# Patient Record
Sex: Female | Born: 1945 | Race: White | Hispanic: No | Smoking: Former smoker
Health system: Southern US, Community
[De-identification: ages and names within clinical notes are randomized; demographics above are authoritative.]

## PROBLEM LIST (undated history)

## (undated) DIAGNOSIS — R112 Nausea with vomiting, unspecified: Secondary | ICD-10-CM

## (undated) DIAGNOSIS — F329 Major depressive disorder, single episode, unspecified: Secondary | ICD-10-CM

## (undated) DIAGNOSIS — D509 Iron deficiency anemia, unspecified: Secondary | ICD-10-CM

## (undated) DIAGNOSIS — R51 Headache: Secondary | ICD-10-CM

## (undated) DIAGNOSIS — R519 Headache, unspecified: Secondary | ICD-10-CM

## (undated) DIAGNOSIS — J449 Chronic obstructive pulmonary disease, unspecified: Secondary | ICD-10-CM

## (undated) DIAGNOSIS — Z974 Presence of external hearing-aid: Secondary | ICD-10-CM

## (undated) DIAGNOSIS — Z9889 Other specified postprocedural states: Secondary | ICD-10-CM

## (undated) DIAGNOSIS — M1990 Unspecified osteoarthritis, unspecified site: Secondary | ICD-10-CM

## (undated) DIAGNOSIS — K635 Polyp of colon: Secondary | ICD-10-CM

## (undated) DIAGNOSIS — C801 Malignant (primary) neoplasm, unspecified: Secondary | ICD-10-CM

## (undated) DIAGNOSIS — I10 Essential (primary) hypertension: Secondary | ICD-10-CM

## (undated) DIAGNOSIS — Z972 Presence of dental prosthetic device (complete) (partial): Secondary | ICD-10-CM

## (undated) DIAGNOSIS — F32A Depression, unspecified: Secondary | ICD-10-CM

## (undated) DIAGNOSIS — G4730 Sleep apnea, unspecified: Secondary | ICD-10-CM

## (undated) DIAGNOSIS — B029 Zoster without complications: Secondary | ICD-10-CM

## (undated) DIAGNOSIS — N183 Chronic kidney disease, stage 3 unspecified: Secondary | ICD-10-CM

## (undated) DIAGNOSIS — Z9989 Dependence on other enabling machines and devices: Secondary | ICD-10-CM

## (undated) DIAGNOSIS — K219 Gastro-esophageal reflux disease without esophagitis: Secondary | ICD-10-CM

## (undated) HISTORY — DX: Gastro-esophageal reflux disease without esophagitis: K21.9

## (undated) HISTORY — PX: BREAST BIOPSY: SHX20

## (undated) HISTORY — DX: Depression, unspecified: F32.A

## (undated) HISTORY — DX: Essential (primary) hypertension: I10

## (undated) HISTORY — PX: NOSE SURGERY: SHX723

## (undated) HISTORY — DX: Unspecified osteoarthritis, unspecified site: M19.90

## (undated) HISTORY — PX: CARPAL TUNNEL RELEASE: SHX101

## (undated) HISTORY — DX: Major depressive disorder, single episode, unspecified: F32.9

## (undated) HISTORY — PX: TONSILLECTOMY: SUR1361

## (undated) HISTORY — PX: KNEE SURGERY: SHX244

## (undated) HISTORY — DX: Polyp of colon: K63.5

## (undated) HISTORY — DX: Iron deficiency anemia, unspecified: D50.9

## (undated) HISTORY — PX: COLONOSCOPY: SHX174

---

## 1997-10-04 ENCOUNTER — Other Ambulatory Visit: Admission: RE | Admit: 1997-10-04 | Discharge: 1997-10-04 | Payer: Self-pay | Admitting: *Deleted

## 1998-06-25 ENCOUNTER — Ambulatory Visit (HOSPITAL_COMMUNITY): Admission: RE | Admit: 1998-06-25 | Discharge: 1998-06-25 | Payer: Self-pay | Admitting: *Deleted

## 1999-03-17 ENCOUNTER — Other Ambulatory Visit: Admission: RE | Admit: 1999-03-17 | Discharge: 1999-03-17 | Payer: Self-pay | Admitting: *Deleted

## 1999-03-23 ENCOUNTER — Ambulatory Visit (HOSPITAL_COMMUNITY): Admission: RE | Admit: 1999-03-23 | Discharge: 1999-03-23 | Payer: Self-pay | Admitting: *Deleted

## 1999-07-07 ENCOUNTER — Ambulatory Visit (HOSPITAL_COMMUNITY): Admission: RE | Admit: 1999-07-07 | Discharge: 1999-07-07 | Payer: Self-pay | Admitting: *Deleted

## 2000-05-25 ENCOUNTER — Other Ambulatory Visit: Admission: RE | Admit: 2000-05-25 | Discharge: 2000-05-25 | Payer: Self-pay | Admitting: *Deleted

## 2000-07-08 ENCOUNTER — Ambulatory Visit (HOSPITAL_COMMUNITY): Admission: RE | Admit: 2000-07-08 | Discharge: 2000-07-08 | Payer: Self-pay | Admitting: *Deleted

## 2001-04-07 ENCOUNTER — Encounter (INDEPENDENT_AMBULATORY_CARE_PROVIDER_SITE_OTHER): Payer: Self-pay | Admitting: *Deleted

## 2001-04-07 ENCOUNTER — Ambulatory Visit (HOSPITAL_COMMUNITY): Admission: RE | Admit: 2001-04-07 | Discharge: 2001-04-07 | Payer: Self-pay | Admitting: Gastroenterology

## 2001-04-07 DIAGNOSIS — Z860100 Personal history of colon polyps, unspecified: Secondary | ICD-10-CM | POA: Insufficient documentation

## 2001-06-29 ENCOUNTER — Other Ambulatory Visit: Admission: RE | Admit: 2001-06-29 | Discharge: 2001-06-29 | Payer: Self-pay | Admitting: *Deleted

## 2001-07-10 ENCOUNTER — Ambulatory Visit (HOSPITAL_COMMUNITY): Admission: RE | Admit: 2001-07-10 | Discharge: 2001-07-10 | Payer: Self-pay | Admitting: *Deleted

## 2001-07-12 ENCOUNTER — Encounter: Admission: RE | Admit: 2001-07-12 | Discharge: 2001-07-12 | Payer: Self-pay | Admitting: *Deleted

## 2001-12-14 ENCOUNTER — Encounter: Admission: RE | Admit: 2001-12-14 | Discharge: 2001-12-14 | Payer: Self-pay | Admitting: *Deleted

## 2002-08-01 ENCOUNTER — Other Ambulatory Visit: Admission: RE | Admit: 2002-08-01 | Discharge: 2002-08-01 | Payer: Self-pay | Admitting: *Deleted

## 2002-10-16 ENCOUNTER — Encounter: Admission: RE | Admit: 2002-10-16 | Discharge: 2002-10-16 | Payer: Self-pay | Admitting: *Deleted

## 2003-08-08 ENCOUNTER — Other Ambulatory Visit: Admission: RE | Admit: 2003-08-08 | Discharge: 2003-08-08 | Payer: Self-pay | Admitting: *Deleted

## 2003-11-01 ENCOUNTER — Encounter: Admission: RE | Admit: 2003-11-01 | Discharge: 2003-11-01 | Payer: Self-pay | Admitting: *Deleted

## 2004-10-22 ENCOUNTER — Other Ambulatory Visit: Payer: Self-pay

## 2004-10-26 ENCOUNTER — Ambulatory Visit: Payer: Self-pay | Admitting: Orthopaedic Surgery

## 2004-12-21 ENCOUNTER — Encounter: Admission: RE | Admit: 2004-12-21 | Discharge: 2004-12-21 | Payer: Self-pay | Admitting: *Deleted

## 2006-01-14 ENCOUNTER — Encounter: Admission: RE | Admit: 2006-01-14 | Discharge: 2006-01-14 | Payer: Self-pay | Admitting: *Deleted

## 2008-05-14 ENCOUNTER — Encounter: Admission: RE | Admit: 2008-05-14 | Discharge: 2008-05-14 | Payer: Self-pay | Admitting: Obstetrics and Gynecology

## 2010-10-02 NOTE — Procedures (Signed)
[facility]  Patient:    Kristie Byrd, Kristie Byrd Visit Number: [id number] MRN: [id number]          Service Type: END Location: ENDO Attending Physician:  Charna Elizabeth Dictated by:   Anselmo Rod, M.D. Proc. Date: 04/07/01 Admit Date:  04/07/2001   CC:         Pershing Cox, M.D.   Procedure Report  DATE OF BIRTH:  03-05-1946  PROCEDURE PERFORMED:  Colonoscopy with snare polypectomy x 2.  ENDOSCOPIST:  Anselmo Rod, M.D.  INSTRUMENT USED:  Pediatric Olympus colonoscope.  INDICATIONS FOR PROCEDURE:  Trace guaiac-positive stools in a 65 year old white female undergoing screening colonoscopy.  PREPROCEDURE PREPARATION:  Informed consent was procured from the patient. The patient was fasted for eight hours prior to the procedure, and prepped with two Fleets enemas and a bottle of Fleets Phospho-Soda the night prior to the procedure.  PREPROCEDURE PHYSICAL:  The patient had stable vital signs.  NECK:  Supple. CHEST:  Clear to auscultation.  S1 and S2 is regular.  Abdomen soft with normal bowel sounds.  DESCRIPTION OF THE PROCEDURE:   The patient was placed in the left lateral decubitus position.  The patient chose not to have any sedation.  Once the patient was adequately positioned, the pediatric Olympus colonoscope was advanced from the rectum to the cecum without difficulty.  A small sessile polyp was removed from the mid-right colon with snare polypectomy.  Another smaller polyp was removed from 70 cm by snare polypectomy, as well as some internal hemorrhoids were appreciated on retroflexion.  No diverticulosis was noted.  IMPRESSION: 1. Flat polyp snared from mid-right colon. 2. Small sessile polyp snared from 70 cm. 3. Small internal hemorrhoids. 4. No diverticulosis or large masses seen.  RECOMMENDATIONS: 1. Await pathology results. 2. Avoid all nonsteroidals including aspirin for the next three    weeks. 3. Repeat  colorectal cancer screening in the next 3-5 years    depending on the pathology results. Dictated by:   Anselmo Rod, M.D. Attending Physician:  Charna Elizabeth DD:  04/07/01 TD:  04/08/01 Job: [id number]

## 2011-09-30 ENCOUNTER — Ambulatory Visit: Payer: Self-pay | Admitting: Sports Medicine

## 2011-10-18 ENCOUNTER — Ambulatory Visit: Payer: Self-pay | Admitting: Anesthesiology

## 2011-10-21 ENCOUNTER — Ambulatory Visit: Payer: Self-pay | Admitting: Orthopedic Surgery

## 2012-06-22 ENCOUNTER — Telehealth: Payer: Self-pay

## 2012-06-22 NOTE — Telephone Encounter (Signed)
Please see if another provider can work her in.  I have approved multiple new patients getting earlier appointments per week and I really cannot keep seeing this many new patients.  Thank you.

## 2012-06-22 NOTE — Telephone Encounter (Signed)
2 days ago when pt stood up pt felt weak & dizzy; no nausea, chest pain,SOB or h/a. This happened again yesterday. Last night pt took a Xanax and this morning pt did not have a problem. Pt said she is having issues with her nerves; is 66 and cannot get a job since had knee surgery. Pt said she does not have problems with health. Pt already has a new medicare pt to establish appt on 08/16/12. Pt wants to know if could be seen sooner.Please advise. Advised pt if condition were to change or worsen before our office calls her back to go to UC or ER.

## 2012-06-22 NOTE — Telephone Encounter (Signed)
i am only in the office 2 days next week - i can't really work her in ASAP

## 2012-06-23 NOTE — Telephone Encounter (Signed)
May place 2/11 at 11:30am and block my 12:00pm appt.

## 2012-06-23 NOTE — Telephone Encounter (Signed)
I called patient to notify her that Dr.Gutierrez could see her on 06/27/12.  Patient said she would wait for Dr.Aron because she didn't think it was that serious.  I advised patient if she feels worse before her appointment in April, she should go to an Urgent Care.  Thank you, Dr.Gutierrez.

## 2012-08-16 ENCOUNTER — Encounter: Payer: Self-pay | Admitting: Family Medicine

## 2012-08-16 ENCOUNTER — Ambulatory Visit (INDEPENDENT_AMBULATORY_CARE_PROVIDER_SITE_OTHER): Payer: Medicare Other | Admitting: Family Medicine

## 2012-08-16 VITALS — BP 130/64 | HR 84 | Temp 98.0°F | Ht 64.25 in | Wt 246.0 lb

## 2012-08-16 DIAGNOSIS — F32A Depression, unspecified: Secondary | ICD-10-CM

## 2012-08-16 DIAGNOSIS — R5381 Other malaise: Secondary | ICD-10-CM

## 2012-08-16 DIAGNOSIS — R259 Unspecified abnormal involuntary movements: Secondary | ICD-10-CM

## 2012-08-16 DIAGNOSIS — F329 Major depressive disorder, single episode, unspecified: Secondary | ICD-10-CM

## 2012-08-16 DIAGNOSIS — Z136 Encounter for screening for cardiovascular disorders: Secondary | ICD-10-CM

## 2012-08-16 DIAGNOSIS — Z1231 Encounter for screening mammogram for malignant neoplasm of breast: Secondary | ICD-10-CM

## 2012-08-16 DIAGNOSIS — I10 Essential (primary) hypertension: Secondary | ICD-10-CM

## 2012-08-16 DIAGNOSIS — R251 Tremor, unspecified: Secondary | ICD-10-CM

## 2012-08-16 DIAGNOSIS — K219 Gastro-esophageal reflux disease without esophagitis: Secondary | ICD-10-CM

## 2012-08-16 LAB — CBC WITH DIFFERENTIAL/PLATELET
Basophils Absolute: 0 10*3/uL (ref 0.0–0.1)
Eosinophils Relative: 1.2 % (ref 0.0–5.0)
Hemoglobin: 9.8 g/dL — ABNORMAL LOW (ref 12.0–15.0)
Lymphs Abs: 1.1 10*3/uL (ref 0.7–4.0)
Monocytes Absolute: 0.5 10*3/uL (ref 0.1–1.0)
Neutrophils Relative %: 71 % (ref 43.0–77.0)
Platelets: 307 10*3/uL (ref 150.0–400.0)

## 2012-08-16 LAB — COMPREHENSIVE METABOLIC PANEL
Albumin: 3.8 g/dL (ref 3.5–5.2)
Alkaline Phosphatase: 92 U/L (ref 39–117)
Calcium: 9.3 mg/dL (ref 8.4–10.5)
Chloride: 102 mEq/L (ref 96–112)
GFR: 57.01 mL/min — ABNORMAL LOW (ref >60.00)
Glucose, Bld: 115 mg/dL — ABNORMAL HIGH (ref 70–99)
Total Bilirubin: 0.3 mg/dL (ref 0.3–1.2)

## 2012-08-16 LAB — HEMOGLOBIN A1C: Hgb A1c MFr Bld: 5.9 % (ref 4.6–6.5)

## 2012-08-16 LAB — LIPID PANEL: Cholesterol: 154 mg/dL (ref 0–200)

## 2012-08-16 LAB — VITAMIN B12: Vitamin B-12: 324 pg/mL (ref 211–911)

## 2012-08-16 MED ORDER — ESCITALOPRAM OXALATE 20 MG PO TABS: 20.0000 mg | ORAL_TABLET | Freq: Every day | ORAL | Status: DC

## 2012-08-16 NOTE — Patient Instructions (Addendum)
Nice to meet you. Please schedule your welcome to medicare visit before your birthday (ok to use two acute slots).  We are going to wean off the celexa.  1 tablet every other day for a week and stop. Ok to start Lexapro right away.  After you go to the lab, please stop by to see [facility].

## 2012-08-16 NOTE — Progress Notes (Signed)
Subjective:    Patient ID: Kristie Byrd, female    DOB: 05-21-1945, 67 y.o.   MRN: [id number]  HPI Very pleasant 67 yo female here to establish care.  Depression- has been on celexa 10 mg daily for years.  She feels it is not working.  Was unfortunately laid off and since then feels "useless."  She is tearful and "down on herself."  She lives alone but very close to her two daughters and grandchildren.  She would never harm herself and has no thoughts of doing so.  Previously could not tolerate prozac, paxil or zoloft but cannot remember why.  HTN- has been well controlled on Lisinopril-HCTZ.  Denies any HA, blurred vision, CP or SOB.  Shakiness- has had two or three episodes of feeling shaky.  Feels better after she eats.  No nausea or vomiting when this happens.  She does get dizzy and has to sit down.  No syncope.  Unsure if she has FH of DM- she grew up in [place] in [place].  Patient Active Problem List  Diagnosis  . Hypertension  . Depression  . Shakiness  . GERD (gastroesophageal reflux disease)   Past Medical History  Diagnosis Date  . Hypertension   . Depression    Past Surgical History  Procedure Laterality Date  . Tonsillectomy    . Knee surgery     History  Substance Use Topics  . Smoking status: Former Games developer  . Smokeless tobacco: Not on file  . Alcohol Use: Not on file   Family History  Problem Relation Age of Onset  . Adopted: Yes   Allergies  Allergen Reactions  . Codeine Nausea And Vomiting  . Demerol (Meperidine) Nausea And Vomiting  . Fleet Phospho-Soda (Sodium Phosphates)     Vomiting   . Prednisone Other (See Comments)    Chest pressure   No current outpatient prescriptions on file prior to visit.   No current facility-administered medications on file prior to visit.   The PMH, PSH, Social History, Family History, Medications, and allergies have been reviewed in [facility], and have been updated if relevant.    Review of Systems See  HPI No Cp No SOB No fevers No night sweats Sleeping ok Denies increase thirst or urination +fatigue    Objective:   Physical Exam BP 130/64  Pulse 84  Temp(Src) 98 F (36.7 C)  Ht 5' 4.25" (1.632 m)  Wt 246 lb (111.585 kg)  BMI 41.9 kg/m2  General:  Well-developed,well-nourished,in no acute distress; alert,appropriate and cooperative throughout examination Head:  normocephalic and atraumatic.   Eyes:  vision grossly intact, pupils equal, pupils round, and pupils reactive to light.   Lungs:  Normal respiratory effort, chest expands symmetrically. Lungs are clear to auscultation, no crackles or wheezes. Heart:  Normal rate and regular rhythm. S1 and S2 normal without gallop, murmur, click, rub or other extra sounds. Abdomen:  Bowel sounds positive,abdomen soft and non-tender without masses, organomegaly or hernias noted. Msk:  No deformity or scoliosis noted of thoracic or lumbar spine.   Extremities:  No clubbing, cyanosis, edema, or deformity noted with normal full range of motion of all joints.   Neurologic:  alert & oriented X3 and gait normal.   Skin:  Intact without suspicious lesions or rashes Cervical Nodes:  No lymphadenopathy noted Axillary Nodes:  No palpable lymphadenopathy Psych:  Cognition and judgment appear intact. Alert and cooperative with normal attention span and concentration. No apparent delusions, illusions, hallucinations  Assessment & Plan:  1. Depression Deteriorated.  She does not want to increase celexa, would like to try something else although she is on low dose celexa. Will wean off celexa (see AVS) and start Lexapro 20 mg daily. Follow up at [facility].  2. Hypertension Stable on Lisinopril-HCTZ. - Comprehensive metabolic panel  3. Shakiness ? Low blood sugar- each instance occurred before she ate.  Check labs for further evaluation. The patient indicates understanding of these issues and agrees with the plan.  -  Hemoglobin A1c - TSH - CBC with Differential  4. Other screening mammogram  - MM Digital Screening; Future  5. GERD (gastroesophageal reflux disease) Controlled on Zantac  6. Other malaise and fatigue Likely multifactorial, worsened by depression.  Will check labs as well today. - Vitamin B12  7. Screening for ischemic heart disease  - Lipid Panel

## 2012-08-17 ENCOUNTER — Other Ambulatory Visit: Payer: Self-pay | Admitting: Family Medicine

## 2012-08-17 ENCOUNTER — Ambulatory Visit: Payer: Self-pay | Admitting: Hematology and Oncology

## 2012-08-17 ENCOUNTER — Telehealth: Payer: Self-pay

## 2012-08-17 DIAGNOSIS — D649 Anemia, unspecified: Secondary | ICD-10-CM

## 2012-08-17 NOTE — Telephone Encounter (Signed)
Pt concerned about lab results; low hgb and having to see a hematologist. Pt wants to know how serious Dr Dayton Martes thinks this is and what might be causing the anemia.Please advise.

## 2012-08-17 NOTE — Telephone Encounter (Signed)
Patient advised.

## 2012-08-17 NOTE — Telephone Encounter (Signed)
No it is not a level that requires a blood transfusion or anything like that.  I am not quite sure what is causing it yet, which is why I referred her to hematologist.  Please tell her not to worry- we are working this up.

## 2012-08-17 NOTE — Telephone Encounter (Signed)
Pt wanted to know if would see Dr Dayton Martes at 09/27/12 visit or would she be talking with someone about the welcome to medicare form. Advised pt to bring completed form with her to the visit and pt will see Dr Dayton Martes for CPX on 09/27/12.

## 2012-08-23 ENCOUNTER — Ambulatory Visit: Payer: Self-pay | Admitting: Hematology and Oncology

## 2012-08-24 LAB — BASIC METABOLIC PANEL
BUN: 24 mg/dL — ABNORMAL HIGH (ref 7–18)
Co2: 30 mmol/L (ref 21–32)
EGFR (African American): 56 — ABNORMAL LOW
EGFR (Non-African Amer.): 48 — ABNORMAL LOW
Osmolality: 282 (ref 275–301)
Potassium: 4.2 mmol/L (ref 3.5–5.1)

## 2012-08-24 LAB — CBC CANCER CENTER
Basophil %: 0.8 %
Eosinophil #: 0.1 x10 3/mm (ref 0.0–0.7)
HCT: 31.2 % — ABNORMAL LOW (ref 35.0–47.0)
HGB: 10.1 g/dL — ABNORMAL LOW (ref 12.0–16.0)
Lymphocyte #: 1.2 x10 3/mm (ref 1.0–3.6)
Lymphocyte %: 19.1 %
MCHC: 32.3 g/dL (ref 32.0–36.0)
MCV: 79 fL — ABNORMAL LOW (ref 80–100)
Monocyte #: 0.6 x10 3/mm (ref 0.2–0.9)
Monocyte %: 9.1 %
Neutrophil %: 69.3 %
Platelet: 297 x10 3/mm (ref 150–440)
WBC: 6.2 x10 3/mm (ref 3.6–11.0)

## 2012-09-14 ENCOUNTER — Ambulatory Visit: Payer: Self-pay | Admitting: Family Medicine

## 2012-09-14 ENCOUNTER — Ambulatory Visit: Payer: Self-pay | Admitting: Hematology and Oncology

## 2012-09-19 ENCOUNTER — Encounter: Payer: Self-pay | Admitting: Family Medicine

## 2012-09-20 ENCOUNTER — Encounter: Payer: Self-pay | Admitting: *Deleted

## 2012-09-26 ENCOUNTER — Other Ambulatory Visit: Payer: Self-pay | Admitting: Family Medicine

## 2012-09-27 ENCOUNTER — Encounter: Payer: Self-pay | Admitting: Family Medicine

## 2012-09-27 ENCOUNTER — Other Ambulatory Visit (HOSPITAL_COMMUNITY)
Admission: RE | Admit: 2012-09-27 | Discharge: 2012-09-27 | Disposition: A | Discharge status: Home / Self Care | Payer: Medicare Other | Source: Ambulatory Visit | Attending: Family Medicine | Admitting: Family Medicine

## 2012-09-27 ENCOUNTER — Ambulatory Visit (INDEPENDENT_AMBULATORY_CARE_PROVIDER_SITE_OTHER): Payer: Medicare Other | Admitting: Family Medicine

## 2012-09-27 VITALS — BP 118/62 | HR 72 | Temp 97.8°F | Ht 65.0 in | Wt 247.0 lb

## 2012-09-27 DIAGNOSIS — D509 Iron deficiency anemia, unspecified: Secondary | ICD-10-CM

## 2012-09-27 DIAGNOSIS — Z0000 Encounter for general adult medical examination without abnormal findings: Secondary | ICD-10-CM

## 2012-09-27 DIAGNOSIS — Z1151 Encounter for screening for human papillomavirus (HPV): Secondary | ICD-10-CM | POA: Insufficient documentation

## 2012-09-27 DIAGNOSIS — Z1211 Encounter for screening for malignant neoplasm of colon: Secondary | ICD-10-CM

## 2012-09-27 DIAGNOSIS — F329 Major depressive disorder, single episode, unspecified: Secondary | ICD-10-CM

## 2012-09-27 DIAGNOSIS — Z01419 Encounter for gynecological examination (general) (routine) without abnormal findings: Secondary | ICD-10-CM | POA: Insufficient documentation

## 2012-09-27 DIAGNOSIS — D649 Anemia, unspecified: Secondary | ICD-10-CM

## 2012-09-27 DIAGNOSIS — I10 Essential (primary) hypertension: Secondary | ICD-10-CM

## 2012-09-27 DIAGNOSIS — K219 Gastro-esophageal reflux disease without esophagitis: Secondary | ICD-10-CM

## 2012-09-27 DIAGNOSIS — F32A Depression, unspecified: Secondary | ICD-10-CM

## 2012-09-27 HISTORY — DX: Iron deficiency anemia, unspecified: D50.9

## 2012-09-27 NOTE — Addendum Note (Signed)
Addended by: Eliezer Bottom on: 09/27/2012 10:25 AM   Modules accepted: Orders

## 2012-09-27 NOTE — Progress Notes (Addendum)
Subjective:    Patient ID: Kristie Byrd, female    DOB: 1945-10-09, 67 y.o.   MRN: [id number]  HPI Very pleasant 67 yo female who established care with me last month here for Welcome to Medicare Visit. I have personally reviewed the Medicare Annual Wellness questionnaire and have noted 1. The patient's medical and social history 2. Their use of alcohol, tobacco or illicit drugs 3. Their current medications and supplements 4. The patient's functional ability including ADL's, fall risks, home safety risks and hearing or visual             impairment. 5. Diet and physical activities 6. Evidence for depression or mood disorders  End of life wishes discussed and updated in Social History.  LMP 10 years ago.  No h/o postmenopausal bleeding or abnormal pap smears.  Screening mammogram 09/14/2012.  Depression- had been on celexa 10 mg daily for years.  She felt it was not working.  Was unfortunately laid off and since then feels "useless."  Previously could not tolerate prozac, paxil or zoloft but cannot remember why. We weaned off celexa last month as she did not want to try increased dose and started lexapro 20 mg daily.  She does feel better on Lexapro.  She does think getting a new job will help even more.  HTN- has been well controlled on Lisinopril-HCTZ.  Denies any HA, blurred vision, CP or SOB. Lab Results  Component Value Date   CREATININE 1.0 08/16/2012    Anemia- CBC abnormal last month.  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  Dizziness and fatigue improving.  Follow up scheduled for July 10th.  Lab Results  Component Value Date   VITAMINB12 324 08/16/2012    Lab Results  Component Value Date   WBC 6.0 08/16/2012   HGB 9.8* 08/16/2012   HCT 29.7* 08/16/2012   MCV 78.7 08/16/2012   PLT 307.0 08/16/2012     Patient Active Problem List   Diagnosis Date Noted  . Routine general medical examination  at a health care facility 09/27/2012  . Shakiness 08/16/2012  . GERD (gastroesophageal reflux disease) 08/16/2012  . Hypertension   . Depression    Past Medical History  Diagnosis Date  . Hypertension   . Depression    Past Surgical History  Procedure Laterality Date  . Tonsillectomy    . Knee surgery     History  Substance Use Topics  . Smoking status: Former Games developer  . Smokeless tobacco: Not on file  . Alcohol Use: Not on file   Family History  Problem Relation Age of Onset  . Adopted: Yes   Allergies  Allergen Reactions  . Codeine Nausea And Vomiting  . Demerol (Meperidine) Nausea And Vomiting  . Fleet Phospho-Soda (Sodium Phosphates)     Vomiting   . Prednisone Other (See Comments)    Chest pressure   Current Outpatient Prescriptions on File Prior to Visit  Medication Sig Dispense Refill  . Acetaminophen (TYLENOL ARTHRITIS PAIN PO) Take by mouth.      Marland Kitchen aspirin 81 MG tablet Take 81 mg by mouth daily.      . Cholecalciferol (VITAMIN D3) 2000 UNITS TABS Take one by mouth daily      . citalopram (CELEXA) 10 MG tablet Take 10 mg by mouth daily.      Marland Kitchen escitalopram (LEXAPRO) 20 MG tablet Take 1 tablet (20 mg total) by mouth daily.  30 tablet  1  . lisinopril-hydrochlorothiazide (PRINZIDE,ZESTORETIC) 10-12.5 MG per tablet take 1 tablet by mouth once daily  90 tablet  1  . Multiple Vitamin (MULTIVITAMIN) tablet Take 1 tablet by mouth daily.      . ranitidine (ZANTAC) 150 MG capsule Take 150 mg by mouth daily.       No current facility-administered medications on file prior to visit.   The PMH, PSH, Social History, Family History, Medications, and allergies have been reviewed in [facility], and have been updated if relevant.    Review of Systems See HPI Patient reports no  vision/ hearing changes,anorexia, weight change, fever ,adenopathy, persistant / recurrent hoarseness, swallowing issues, chest pain, edema,persistant / recurrent cough, hemoptysis, dyspnea(rest,  exertional, paroxysmal nocturnal), gastrointestinal  bleeding (melena, rectal bleeding), abdominal pain, excessive heart burn, GU symptoms(dysuria, hematuria, pyuria, voiding/incontinence  Issues) syncope, focal weakness, severe memory loss, concerning skin lesions, depression, anxiety, abnormal bruising/bleeding, major joint swelling, breast masses or abnormal vaginal bleeding.       Objective:   Physical Exam BP 118/62  Pulse 72  Temp(Src) 97.8 F (36.6 C)  Ht 5\' 5"  (1.651 m)  Wt 247 lb (112.038 kg)  BMI 41.1 kg/m2   General:  Well-developed,well-nourished,in no acute distress; alert,appropriate and cooperative throughout examination Head:  normocephalic and atraumatic.   Eyes:  vision grossly intact, pupils equal, pupils round, and pupils reactive to light.   Ears:  R ear normal and L ear normal.   Nose:  no external deformity.   Mouth:  good dentition.   Neck:  No deformities, masses, or tenderness noted. Breasts:  No mass, nodules, thickening, tenderness, bulging, retraction, inflamation, nipple discharge or skin changes noted.   Lungs:  Normal respiratory effort, chest expands symmetrically. Lungs are clear to auscultation, no crackles or wheezes. Heart:  Normal rate and regular rhythm. S1 and S2 normal without gallop, murmur, click, rub or other extra sounds. Abdomen:  Bowel sounds positive,abdomen soft and non-tender without masses, organomegaly or hernias noted. Rectal:  no external abnormalities.   Genitalia:  Pelvic Exam:        External: normal female genitalia without lesions or masses        Vagina: normal without lesions or masses        Cervix: normal without lesions or masses        Adnexa: normal bimanual exam without masses or fullness        Uterus: normal by palpation        Pap smear: performed Msk:  No deformity or scoliosis noted of thoracic or lumbar spine.   Extremities:  No clubbing, cyanosis, edema, or deformity noted with normal full range of motion of  all joints.   Neurologic:  alert & oriented X3 and gait normal.   Skin:  Intact without suspicious lesions or rashes Cervical Nodes:  No lymphadenopathy noted Axillary Nodes:  No palpable lymphadenopathy Psych:  Cognition and judgment appear intact. Alert and cooperative with normal attention span and concentration. No apparent delusions, illusions, hallucinations      Assessment & Plan:    1. Depression Improved with change in SSRI.  Continue to monitor.  2. GERD (gastroesophageal reflux disease) Quiet on Zantac.  3. Hypertension Well controlled on current dose of Prinzide. No change to Rx.  4. Routine general medical examination at a health care facility The patients weight, height, BMI and visual acuity have been recorded in the chart I have made referrals, counseling and provided education to the patient based  review of the above and I have provided the pt with a written personalized care plan for preventive services.  Pap smear today. IFOB (declining colonoscopy). EKG NSR, low voltage.  5. [facility]

## 2012-09-27 NOTE — Patient Instructions (Signed)
Good to see you.  Please stop by the lab to get your stool cards.  Check with your insurance to see if they will cover the shingles shot.

## 2012-10-03 ENCOUNTER — Encounter: Payer: Self-pay | Admitting: *Deleted

## 2012-10-15 ENCOUNTER — Other Ambulatory Visit: Payer: Self-pay | Admitting: Family Medicine

## 2012-10-19 ENCOUNTER — Other Ambulatory Visit (INDEPENDENT_AMBULATORY_CARE_PROVIDER_SITE_OTHER): Payer: Medicare Other

## 2012-10-19 DIAGNOSIS — Z1211 Encounter for screening for malignant neoplasm of colon: Secondary | ICD-10-CM

## 2012-10-20 ENCOUNTER — Encounter: Payer: Self-pay | Admitting: *Deleted

## 2012-10-20 ENCOUNTER — Encounter: Payer: Self-pay | Admitting: Family Medicine

## 2012-10-20 LAB — FECAL OCCULT BLOOD, IMMUNOCHEMICAL: Fecal Occult Bld: NEGATIVE

## 2012-11-21 ENCOUNTER — Ambulatory Visit: Payer: Self-pay | Admitting: Hematology and Oncology

## 2012-11-22 LAB — CBC CANCER CENTER
Eosinophil #: 0.1 x10 3/mm (ref 0.0–0.7)
Eosinophil %: 1.2 %
HCT: 30.7 % — ABNORMAL LOW (ref 35.0–47.0)
MCHC: 32.3 g/dL (ref 32.0–36.0)
MCV: 79 fL — ABNORMAL LOW (ref 80–100)
Monocyte #: 0.6 x10 3/mm (ref 0.2–0.9)
Monocyte %: 8 %
Neutrophil #: 5.2 x10 3/mm (ref 1.4–6.5)
Platelet: 312 x10 3/mm (ref 150–440)
RBC: 3.87 10*6/uL (ref 3.80–5.20)
RDW: 16.1 % — ABNORMAL HIGH (ref 11.5–14.5)
WBC: 7.1 x10 3/mm (ref 3.6–11.0)

## 2012-11-22 LAB — FERRITIN: Ferritin (ARMC): 4 ng/mL — ABNORMAL LOW (ref 8–388)

## 2012-11-23 ENCOUNTER — Telehealth: Payer: Self-pay

## 2012-11-23 ENCOUNTER — Encounter: Payer: Self-pay | Admitting: Family Medicine

## 2012-11-23 ENCOUNTER — Ambulatory Visit (INDEPENDENT_AMBULATORY_CARE_PROVIDER_SITE_OTHER): Payer: Medicare Other | Admitting: Family Medicine

## 2012-11-23 VITALS — BP 120/68 | HR 80 | Temp 97.7°F | Wt 249.0 lb

## 2012-11-23 DIAGNOSIS — R5381 Other malaise: Secondary | ICD-10-CM | POA: Insufficient documentation

## 2012-11-23 DIAGNOSIS — R5383 Other fatigue: Secondary | ICD-10-CM

## 2012-11-23 MED ORDER — ONETOUCH BASIC SYSTEM W/DEVICE KIT: PACK | Status: DC

## 2012-11-23 NOTE — Progress Notes (Signed)
  Subjective:    Patient ID: Kristie Byrd, female    DOB: [birth year], 67 y.o.   MRN: [id number]  HPI  No charge.  Was told by hematology to come in for a1c to rule out DM.  A1c was normal in 08/2012.  Pt would like to cancel appt. Lab Results  Component Value Date   HGBA1C 5.9 08/16/2012     Review of Systems     Objective:   Physical Exam        Assessment & Plan:

## 2012-11-23 NOTE — Addendum Note (Signed)
Addended by: Dianne Dun on: 11/23/2012 09:10 AM   Modules accepted: Orders

## 2012-11-23 NOTE — Telephone Encounter (Signed)
[facility] [place] request specific instructions and diagnosis for glucose testing device and strips; CMA spoke with Dr Dayton Martes and pt does not have diagnosis to file with insurance co. Pt requested to have in case "felt funny" and needed to ck BS. Pt will need to pay out of pocket.

## 2012-12-14 LAB — CBC CANCER CENTER
Basophil #: 0.1 x10 3/mm (ref 0.0–0.1)
HCT: 33.4 % — ABNORMAL LOW (ref 35.0–47.0)
Lymphocyte #: 1 x10 3/mm (ref 1.0–3.6)
Lymphocyte %: 19 %
MCH: 27.7 pg (ref 26.0–34.0)
MCV: 83 fL (ref 80–100)
Monocyte #: 0.4 x10 3/mm (ref 0.2–0.9)
Monocyte %: 8.1 %
Neutrophil #: 3.7 x10 3/mm (ref 1.4–6.5)
Neutrophil %: 70.6 %
RBC: 4.01 10*6/uL (ref 3.80–5.20)
RDW: 19.7 % — ABNORMAL HIGH (ref 11.5–14.5)
WBC: 5.3 x10 3/mm (ref 3.6–11.0)

## 2012-12-14 LAB — BASIC METABOLIC PANEL
Anion Gap: 8 (ref 7–16)
Calcium, Total: 9.2 mg/dL (ref 8.5–10.1)
Chloride: 103 mmol/L (ref 98–107)
Co2: 29 mmol/L (ref 21–32)
EGFR (Non-African Amer.): 50 — ABNORMAL LOW
Glucose: 129 mg/dL — ABNORMAL HIGH (ref 65–99)
Osmolality: 283 (ref 275–301)
Potassium: 4.1 mmol/L (ref 3.5–5.1)
Sodium: 140 mmol/L (ref 136–145)

## 2012-12-15 ENCOUNTER — Ambulatory Visit: Payer: Self-pay | Admitting: Hematology and Oncology

## 2012-12-15 ENCOUNTER — Telehealth: Payer: Self-pay | Admitting: *Deleted

## 2012-12-15 DIAGNOSIS — Z1211 Encounter for screening for malignant neoplasm of colon: Secondary | ICD-10-CM

## 2012-12-15 NOTE — Telephone Encounter (Signed)
Ok to have zostavax.  I can refer to GI and she can discuss different prep options.

## 2012-12-15 NOTE — Telephone Encounter (Signed)
Pt wants to set up nurse visit for zostavax, states insurance will cover.  Also, hematologist has told her that she needs to have colonoscopy.  Pt needs referral, does not want to go to [place].  She states she is highly allergic to the prep, so if that is the only way she can have a colonoscopy then she will not have one.  Is there an alternative?  Please advise.

## 2012-12-18 MED ORDER — ZOSTER VACCINE LIVE 19400 UNT/0.65ML ~~LOC~~ SOLR: 0.6500 mL | Freq: Once | SUBCUTANEOUS | Status: DC

## 2012-12-18 NOTE — Telephone Encounter (Signed)
Agrees with referral for colonoscopy.  She wants to see someone in [place].   Also, she wants order for [facility]ostavax to [facility] to [facility] [place].

## 2012-12-18 NOTE — Telephone Encounter (Signed)
Referral placed.  Zostavax rx sent.

## 2012-12-19 ENCOUNTER — Encounter: Payer: Self-pay | Admitting: Internal Medicine

## 2012-12-19 ENCOUNTER — Encounter: Payer: Self-pay | Admitting: Family Medicine

## 2012-12-20 ENCOUNTER — Other Ambulatory Visit: Payer: Self-pay

## 2013-01-10 ENCOUNTER — Other Ambulatory Visit: Payer: Self-pay | Admitting: Family Medicine

## 2013-01-25 ENCOUNTER — Encounter: Payer: Self-pay | Admitting: Internal Medicine

## 2013-01-25 ENCOUNTER — Ambulatory Visit (INDEPENDENT_AMBULATORY_CARE_PROVIDER_SITE_OTHER): Payer: Medicare Other | Admitting: Internal Medicine

## 2013-01-25 VITALS — BP 120/74 | HR 104 | Ht 64.0 in | Wt 245.0 lb

## 2013-01-25 DIAGNOSIS — D649 Anemia, unspecified: Secondary | ICD-10-CM

## 2013-01-25 DIAGNOSIS — Z86010 Personal history of colonic polyps: Secondary | ICD-10-CM

## 2013-01-25 MED ORDER — METOCLOPRAMIDE HCL 10 MG PO TABS: 10.0000 mg | ORAL_TABLET | ORAL | Status: DC

## 2013-01-25 NOTE — Assessment & Plan Note (Addendum)
Needs a colonoscopy to look for occult blood loss from neoplasia as a cause. If negative could need EGD, ? Celiac testing.

## 2013-01-25 NOTE — Patient Instructions (Addendum)
You have been scheduled for a colonoscopy with propofol. Please follow written instructions given to you at your visit today.  Please pick up your prep kit at the pharmacy within the next 1-3 days. If you use inhalers (even only as needed), please bring them with you on the day of your procedure. Your physician has requested that you go to www.startemmi.com and enter the access code given to you at your visit today. This web site gives a general overview about your procedure. However, you should still follow specific instructions given to you by our office regarding your preparation for the procedure. We have sent the following medications to your pharmacy for you to pick up at your convenience: Reglan  We have given you a sample of prep o pick to take for your colon prep. CC:  Ruthe Mannan MD

## 2013-01-25 NOTE — Progress Notes (Signed)
Subjective:    Patient ID: Kristie Byrd, female    DOB: Sep 30, 1945, 67 y.o.   MRN: [id number]  HPI This is a very nice lady here because of a microcytic anemia and she has been recommended to have a colonoscopy. She has had them in the past - last was 2002 and had a serrated adenoma removed (Dr. Loreta Ave). She has avoided exams since due to violent vomiting from Fleet's phospho soda +/- Colyte. Prior to that she had a sigmoidoscopy that was turned into a full colonoscopy without sedation.  She is not having bleeding, iFOBT was negative 08/2012. However she has been anemic and is seeing hematology in [facility] and is getting iron infusions. Also getting B12 injections  Allergies  Allergen Reactions  . Codeine Nausea And Vomiting  . Demerol [Meperidine] Nausea And Vomiting  . Fleet Phospho-Soda [Sodium Phosphates]     Vomiting   . Prednisone Other (See Comments)    Chest pressure   Outpatient Prescriptions Prior to Visit  Medication Sig Dispense Refill  . Acetaminophen (TYLENOL ARTHRITIS PAIN PO) Take by mouth.      Marland Kitchen aspirin 81 MG tablet Take 81 mg by mouth daily.      . Blood Glucose Monitoring Suppl (ONE TOUCH BASIC SYSTEM) W/DEVICE KIT Use as directed  1 each  0  . Cholecalciferol (VITAMIN D3) 2000 UNITS TABS Take one by mouth daily      . cyanocobalamin (,VITAMIN B-12,) 1000 MCG/ML injection Inject 1,000 mcg into the muscle every 30 (thirty) days.      Marland Kitchen escitalopram (LEXAPRO) 20 MG tablet take 1 tablet by mouth once daily  30 tablet  2  . lisinopril-hydrochlorothiazide (PRINZIDE,ZESTORETIC) 10-12.5 MG per tablet take 1 tablet by mouth once daily  90 tablet  1  . Multiple Vitamin (MULTIVITAMIN) tablet Take 1 tablet by mouth daily.      . ranitidine (ZANTAC) 150 MG capsule Take 150 mg by mouth daily.      Marland Kitchen zoster vaccine live, PF, (ZOSTAVAX) 40981 UNT/0.65ML injection Inject 19,400 Units into the skin once.  1 each  0   No facility-administered medications prior to visit.   Past Medical  History  Diagnosis Date  . Hypertension   . Depression   . GERD (gastroesophageal reflux disease)   . Arthritis   . Colon polyps    Past Surgical History  Procedure Laterality Date  . Tonsillectomy    . Knee surgery Right   . Breast biopsy Bilateral   . Nose surgery     History   Social History  . Marital Status: Divorced    Spouse Name: N/A    Number of Children: 2  . Years of Education: N/A   Occupational History  . retired    Social History Main Topics  . Smoking status: Former Smoker    Types: Cigarettes  . Smokeless tobacco: Never Used  . Alcohol Use: No  . Drug Use: No  . Sexual Activity: No          Social History Narrative   Lives alone.  Divorced. Two daughters- in 37s, 3 grandchildren.   Does not know family history- raised in [place].        Desires CPR.  Does not have HPOA.   She would possibly want life support if for short period of time . Unsure about feeding tube.   Family History  Problem Relation Age of Onset  . Adopted: Yes   Review of Systems As  above. Some fatigue and malaise All other ROS negative    Objective:   Physical Exam General:  Well-developed, well-nourished and in no acute distress, obese Eyes:  anicteric. Neck:   supple w/o thyromegaly or mass.  Lungs: Clear to auscultation bilaterally. Heart:  S1S2, no rubs, murmurs, gallops. Abdomen:  obese soft, non-tender, no hepatosplenomegaly, hernia, or mass and BS+.  Rectal: deferred Lymph:  no cervical or supraclavicular adenopathy. Extremities:   no edema Skin   no rash. Neuro:  A&O x 3.  Psych:  appropriate mood and  Affect.   Data Reviewed: Lab Results  Component Value Date   WBC 6.0 08/16/2012   HGB 9.8* 08/16/2012   HCT 29.7* 08/16/2012   MCV 78.7 08/16/2012   PLT 307.0 08/16/2012   Ferritin 4 in July Lab Results  Component Value Date   VITAMINB12 324 08/16/2012       Assessment & Plan:  Personal history of colonic polyps  Anemia - iron deficiency  (? If B12)  I appreciate the opportunity to care for this patient.  ZO:[id number] Dayton Martes, MD and Dr. Gerline Legacy ([facility]em[facility]c [facility])

## 2013-01-25 NOTE — Assessment & Plan Note (Addendum)
Overdue for repeat colonoscopy to look for recurrent polyps - she had avoided due to bad experiences with prep (vomiting) and pain (unsedated)  Will try Prep-o-Pik (low volume) with metaclopramide   The risks and benefits as well as alternatives of endoscopic procedure(s) have been discussed and reviewed. All questions answered. The patient agrees to proceed.

## 2013-01-26 ENCOUNTER — Encounter: Payer: Self-pay | Admitting: Internal Medicine

## 2013-02-06 ENCOUNTER — Encounter: Payer: Self-pay | Admitting: Internal Medicine

## 2013-02-06 ENCOUNTER — Other Ambulatory Visit (INDEPENDENT_AMBULATORY_CARE_PROVIDER_SITE_OTHER): Payer: Medicare Other

## 2013-02-06 ENCOUNTER — Ambulatory Visit (AMBULATORY_SURGERY_CENTER): Payer: Medicare Other | Admitting: Internal Medicine

## 2013-02-06 VITALS — BP 131/61 | HR 81 | Temp 97.3°F | Resp 33 | Ht 64.0 in | Wt 245.0 lb

## 2013-02-06 DIAGNOSIS — D509 Iron deficiency anemia, unspecified: Secondary | ICD-10-CM

## 2013-02-06 DIAGNOSIS — D126 Benign neoplasm of colon, unspecified: Secondary | ICD-10-CM

## 2013-02-06 DIAGNOSIS — Z86010 Personal history of colonic polyps: Secondary | ICD-10-CM

## 2013-02-06 MED ORDER — SODIUM CHLORIDE 0.9 % IV SOLN: 500.0000 mL | INTRAVENOUS | Status: DC

## 2013-02-06 NOTE — Progress Notes (Signed)
Patient did not have preoperative order for IV antibiotic SSI prophylaxis. (G8918)  Patient did not experience any of the following events: a burn prior to discharge; a fall within the facility; wrong site/side/patient/procedure/implant event; or a hospital transfer or hospital admission upon discharge from the facility. (G8907)  

## 2013-02-06 NOTE — Op Note (Signed)
[facility] [address] [place], [address]   COLONOSCOPY PROCEDURE REPORT  PATIENT: Kristie Byrd, Kristie Byrd  MR#: [id number] BIRTHDATE: 10-08-1945 , 67  yrs. old GENDER: Female ENDOSCOPIST: Iva Boop, MD, [facility] REFERRED XB:JYNWG Aron, M.D. PROCEDURE DATE:  02/06/2013 PROCEDURE:   Colonoscopy with biopsy First Screening Colonoscopy - Avg.  risk and is 50 yrs.  old or older - No.  Prior Negative Screening - Now for repeat screening. N/A  History of Adenoma - Now for follow-up colonoscopy & has been > or = to 3 yrs.  Yes hx of adenoma.  Has been 3 or more years since last colonoscopy.  Polyps Removed Today? Yes. ASA CLASS:   Class III INDICATIONS:Patient's personal history of adenomatous colon polyps. Iron-deficiency anemia MEDICATIONS: propofol (Diprivan) 200mg  IV, MAC sedation, administered by CRNA, and These medications were titrated to patient response per physician's verbal order  DESCRIPTION OF PROCEDURE:   After the risks benefits and alternatives of the procedure were thoroughly explained, informed consent was obtained.  A digital rectal exam revealed no abnormalities of the rectum.   The LB [id number]-[id number]  endoscope was introduced through the anus and advanced to the cecum, which was identified by both the appendix and ileocecal valve. No adverse events experienced.   The quality of the prep was Prepopik good The instrument was then slowly withdrawn as the colon was fully examined.    COLON FINDINGS: Three diminutive sessile polyps were found in the sigmoid colon.  A polypectomy was performed with cold forceps.  The resection was complete and the polyp tissue was completely retrieved.   The colon mucosa was otherwise normal.  Retroflexed views revealed no abnormalities. The time to cecum=1 minutes 59 seconds.  Withdrawal time=9 minutes 27 seconds.  The scope was withdrawn and the procedure completed. COMPLICATIONS: There were no  complications.  ENDOSCOPIC IMPRESSION: 1.   Three diminutive sessile polyps were found in the sigmoid colon; polypectomy was performed with cold forceps 2.   The colon mucosa was otherwise normal - good prep - hx adenoma(s)  RECOMMENDATIONS: Timing of repeat colonoscopy will be determined by pathology findings. TTG Ab and IgA level today - to evaluate iron deficiency anemia   eSigned:  Iva Boop, MD, [facility] 02/06/2013 3:30 PM  cc: The Patient, Dr. Gerline [facility])and Ruthe Mannan MD

## 2013-02-06 NOTE — Progress Notes (Signed)
Pt had iron infusions June 2014 and July 2014. ewm

## 2013-02-06 NOTE — Progress Notes (Signed)
Called to room to assist during endoscopic procedure.  Patient ID and intended procedure confirmed with present staff. Received instructions for my participation in the procedure from the performing physician.  

## 2013-02-06 NOTE — Progress Notes (Signed)
Lidocaine-40mg IV prior to Propofol InductionPropofol given over incremental dosages 

## 2013-02-06 NOTE — Patient Instructions (Addendum)
I found and removed 3 tiny polyps - no cancer. No source of your anemia.  Otherwise normal colonoscopy - good prep.  I want you to get some lab tests today to look for celiac disease - could be cause of anemia. I will call with results.  I will let you know pathology results and when to have another routine colonoscopy by mail.  I appreciate the opportunity to care for you. Iva Boop, MD, FACG  YOU HAD AN ENDOSCOPIC PROCEDURE TODAY AT THE [facility]: Refer to the procedure report that was given to you for any specific questions about what was found during the examination.  If the procedure report does not answer your questions, please call your gastroenterologist to clarify.  If you requested that your care partner not be given the details of your procedure findings, then the procedure report has been included in a sealed envelope for you to review at your convenience later.  YOU SHOULD EXPECT: Some feelings of bloating in the abdomen. Passage of more gas than usual.  Walking can help get rid of the air that was put into your GI tract during the procedure and reduce the bloating. If you had a lower endoscopy (such as a colonoscopy or flexible sigmoidoscopy) you may notice spotting of blood in your stool or on the toilet paper. If you underwent a bowel prep for your procedure, then you may not have a normal bowel movement for a few days.  DIET: Your first meal following the procedure should be a light meal and then it is ok to progress to your normal diet.  A half-sandwich or bowl of soup is an example of a good first meal.  Heavy or fried foods are harder to digest and may make you feel nauseous or bloated.  Likewise meals heavy in dairy and vegetables can cause extra gas to form and this can also increase the bloating.  Drink plenty of fluids but you should avoid alcoholic beverages for 24 hours.  ACTIVITY: Your care partner should take you home directly after the procedure.   You should plan to take it easy, moving slowly for the rest of the day.  You can resume normal activity the day after the procedure however you should NOT DRIVE or use heavy machinery for 24 hours (because of the sedation medicines used during the test).    SYMPTOMS TO REPORT IMMEDIATELY: A gastroenterologist can be reached at any hour.  During normal business hours, 8:30 AM to 5:00 PM Monday through Friday, call [phone].  After hours and on weekends, please call the GI answering service at [phone] who will take a message and have the physician on call contact you.   Following lower endoscopy (colonoscopy or flexible sigmoidoscopy):  Excessive amounts of blood in the stool  Significant tenderness or worsening of abdominal pains  Swelling of the abdomen that is new, acute  Fever of 100F or higher   FOLLOW UP: If any biopsies were taken you will be contacted by phone or by letter within the next 1-3 weeks.  Call your gastroenterologist if you have not heard about the biopsies in 3 weeks.  Our staff will call the home number listed on your records the next business day following your procedure to check on you and address any questions or concerns that you may have at that time regarding the information given to you following your procedure. This is a courtesy call and so if there is no answer  at the home number and we have not heard from you through the emergency physician on call, we will assume that you have returned to your regular daily activities without incident.  SIGNATURES/CONFIDENTIALITY: You and/or your care partner have signed paperwork which will be entered into your electronic medical record.  These signatures attest to the fact that that the information above on your After Visit Summary has been reviewed and is understood.  Full responsibility of the confidentiality of this discharge information lies with you and/or your care-partner.  Recommendations Timing of repeat  colonoscopy will be determined by pathology findings. TTG, Ab, and IgA level today-to evaluate iron deficiency anemia

## 2013-02-07 ENCOUNTER — Telehealth: Payer: Self-pay | Admitting: *Deleted

## 2013-02-07 NOTE — Telephone Encounter (Signed)
No answer, message left for the patient. 

## 2013-02-08 NOTE — Progress Notes (Signed)
Quick Note:  She does not have celiac disease. Polyp pathology pending from colonoscopy. I recommend that she have an upper GI endoscopy because of the iron deficiency anemia. ______

## 2013-02-13 ENCOUNTER — Encounter: Payer: Self-pay | Admitting: Internal Medicine

## 2013-03-26 ENCOUNTER — Other Ambulatory Visit: Payer: Self-pay | Admitting: Family Medicine

## 2013-04-04 ENCOUNTER — Ambulatory Visit: Payer: Self-pay | Admitting: Hematology and Oncology

## 2013-04-04 LAB — CBC CANCER CENTER
Basophil %: 1 %
Eosinophil #: 0 x10 3/mm (ref 0.0–0.7)
Eosinophil %: 0.3 %
HCT: 35.8 % (ref 35.0–47.0)
HGB: 11.8 g/dL — ABNORMAL LOW (ref 12.0–16.0)
Lymphocyte #: 1.2 x10 3/mm (ref 1.0–3.6)
MCH: 29.7 pg (ref 26.0–34.0)
MCHC: 33.1 g/dL (ref 32.0–36.0)
MCV: 90 fL (ref 80–100)
Neutrophil #: 3.4 x10 3/mm (ref 1.4–6.5)
Neutrophil %: 65.8 %
Platelet: 266 x10 3/mm (ref 150–440)
RDW: 13.4 % (ref 11.5–14.5)

## 2013-04-04 LAB — RETICULOCYTES: Reticulocyte: 2.02 % (ref 0.4–3.1)

## 2013-04-04 LAB — IRON AND TIBC
Iron Saturation: 18 %
Unbound Iron-Bind.Cap.: 322 ug/dL

## 2013-04-04 LAB — FERRITIN: Ferritin (ARMC): 32 ng/mL (ref 8–388)

## 2013-04-16 ENCOUNTER — Ambulatory Visit: Payer: Self-pay | Admitting: Hematology and Oncology

## 2013-04-16 ENCOUNTER — Telehealth: Payer: Self-pay | Admitting: Family Medicine

## 2013-04-16 NOTE — Telephone Encounter (Signed)
Pt needs a handicapped parking sticker due to the surgery she had over a year ago.  She has to walk especially far when she goes to church and then is in so much pain she can't even focus on the service.  She wants to know how we can help her get a sticker.

## 2013-04-17 ENCOUNTER — Other Ambulatory Visit: Payer: Self-pay | Admitting: Family Medicine

## 2013-04-18 NOTE — Telephone Encounter (Signed)
Kristie Byrd talked with patient and stated that patient will be bringing the form in to be completed.

## 2013-04-18 NOTE — Telephone Encounter (Signed)
Please refill until PCP returns

## 2013-04-18 NOTE — Telephone Encounter (Signed)
done

## 2013-05-15 ENCOUNTER — Telehealth: Payer: Self-pay | Admitting: Family Medicine

## 2013-05-15 DIAGNOSIS — Z0279 Encounter for issue of other medical certificate: Secondary | ICD-10-CM

## 2013-05-15 NOTE — Telephone Encounter (Signed)
Pt dropped off Handi-Cap Sticker form. I put it in Dr. Elmer Sow inbox on her desk.

## 2013-05-16 ENCOUNTER — Other Ambulatory Visit: Payer: Self-pay | Admitting: Family Medicine

## 2013-05-16 ENCOUNTER — Telehealth: Payer: Self-pay

## 2013-05-16 NOTE — Telephone Encounter (Signed)
Pt left v/m that she has already gotten a pneumonia shot but wants to know if she should get new pneumonia shot (?prevnar 13)Please advise.

## 2013-05-18 NOTE — Telephone Encounter (Signed)
Lm on pts vm requesting a call back 

## 2013-05-18 NOTE — Telephone Encounter (Signed)
No I would not recommend it

## 2013-05-18 NOTE — Telephone Encounter (Signed)
Spoke to pt and advised per Dr Aron.  

## 2013-05-18 NOTE — Telephone Encounter (Signed)
Spoke to pt who states that she has received completed form front office

## 2013-06-18 ENCOUNTER — Other Ambulatory Visit: Payer: Self-pay | Admitting: Family Medicine

## 2013-06-18 NOTE — Telephone Encounter (Signed)
Last office visit 11/23/12. Ok to refill?

## 2013-06-27 ENCOUNTER — Ambulatory Visit: Payer: Self-pay | Admitting: Hematology and Oncology

## 2013-06-27 LAB — CBC CANCER CENTER
BASOS ABS: 0 x10 3/mm (ref 0.0–0.1)
Basophil %: 0.9 %
Eosinophil #: 0 x10 3/mm (ref 0.0–0.7)
Eosinophil %: 0.3 %
HCT: 34.9 % — AB (ref 35.0–47.0)
HGB: 11.4 g/dL — AB (ref 12.0–16.0)
Lymphocyte #: 1.1 x10 3/mm (ref 1.0–3.6)
Lymphocyte %: 22.6 %
MCH: 28.6 pg (ref 26.0–34.0)
MCHC: 32.5 g/dL (ref 32.0–36.0)
MCV: 88 fL (ref 80–100)
MONOS PCT: 9.3 %
Monocyte #: 0.5 x10 3/mm (ref 0.2–0.9)
NEUTROS PCT: 66.9 %
Neutrophil #: 3.4 x10 3/mm (ref 1.4–6.5)
Platelet: 261 x10 3/mm (ref 150–440)
RBC: 3.97 10*6/uL (ref 3.80–5.20)
RDW: 13.4 % (ref 11.5–14.5)
WBC: 5.1 x10 3/mm (ref 3.6–11.0)

## 2013-06-27 LAB — FERRITIN: Ferritin (ARMC): 14 ng/mL (ref 8–388)

## 2013-06-27 LAB — IRON AND TIBC
Iron Bind.Cap.(Total): 416 ug/dL (ref 250–450)
Iron Saturation: 18 %
Iron: 74 ug/dL (ref 50–170)
UNBOUND IRON-BIND. CAP.: 342 ug/dL

## 2013-06-27 LAB — RETICULOCYTES
Absolute Retic Count: 0.0551 10*6/uL (ref 0.019–0.186)
RETICULOCYTE: 1.39 % (ref 0.4–3.1)

## 2013-07-15 ENCOUNTER — Ambulatory Visit: Payer: Self-pay | Admitting: Hematology and Oncology

## 2013-09-19 ENCOUNTER — Other Ambulatory Visit: Payer: Self-pay | Admitting: *Deleted

## 2013-09-19 MED ORDER — LISINOPRIL-HYDROCHLOROTHIAZIDE 10-12.5 MG PO TABS: ORAL_TABLET | ORAL | Status: DC

## 2013-09-19 NOTE — Telephone Encounter (Signed)
Received a faxed refill request for b12 inj. Rx was originally written by Dr Kallie Edward Nita Sells) on 08/25/12. Rx last filled 08/19/13. Pt's last ov was a cpx in 5/14, she has no f/u appts scheduled. Is it ok to fill, or does pharmacy need to contact Dr Elvera Maria office?

## 2013-09-19 NOTE — Telephone Encounter (Signed)
Needs to follow up with Dr. Kallie Edward.

## 2013-09-19 NOTE — Telephone Encounter (Signed)
It does not look like we have ever given her an injection nor have you ordered it--please advise

## 2013-10-26 ENCOUNTER — Ambulatory Visit: Payer: Self-pay | Admitting: Hematology and Oncology

## 2013-10-26 LAB — CBC CANCER CENTER
BASOS PCT: 0.8 %
Basophil #: 0 x10 3/mm (ref 0.0–0.1)
Eosinophil #: 0 x10 3/mm (ref 0.0–0.7)
Eosinophil %: 0.5 %
HCT: 36 % (ref 35.0–47.0)
HGB: 11.8 g/dL — ABNORMAL LOW (ref 12.0–16.0)
LYMPHS ABS: 1 x10 3/mm (ref 1.0–3.6)
Lymphocyte %: 18.9 %
MCH: 28.7 pg (ref 26.0–34.0)
MCHC: 32.7 g/dL (ref 32.0–36.0)
MCV: 88 fL (ref 80–100)
MONOS PCT: 7.3 %
Monocyte #: 0.4 x10 3/mm (ref 0.2–0.9)
NEUTROS PCT: 72.5 %
Neutrophil #: 3.9 x10 3/mm (ref 1.4–6.5)
Platelet: 294 x10 3/mm (ref 150–440)
RBC: 4.11 10*6/uL (ref 3.80–5.20)
RDW: 14.6 % — AB (ref 11.5–14.5)
WBC: 5.4 x10 3/mm (ref 3.6–11.0)

## 2013-10-26 LAB — COMPREHENSIVE METABOLIC PANEL
ANION GAP: 10 (ref 7–16)
Albumin: 3.6 g/dL (ref 3.4–5.0)
Alkaline Phosphatase: 96 U/L
BILIRUBIN TOTAL: 0.4 mg/dL (ref 0.2–1.0)
BUN: 22 mg/dL — ABNORMAL HIGH (ref 7–18)
CHLORIDE: 102 mmol/L (ref 98–107)
CREATININE: 1.24 mg/dL (ref 0.60–1.30)
Calcium, Total: 9.1 mg/dL (ref 8.5–10.1)
Co2: 28 mmol/L (ref 21–32)
GFR CALC AF AMER: 52 — AB
GFR CALC NON AF AMER: 45 — AB
Glucose: 150 mg/dL — ABNORMAL HIGH (ref 65–99)
Osmolality: 286 (ref 275–301)
Potassium: 3.7 mmol/L (ref 3.5–5.1)
SGOT(AST): 15 U/L (ref 15–37)
SGPT (ALT): 21 U/L (ref 12–78)
SODIUM: 140 mmol/L (ref 136–145)
TOTAL PROTEIN: 7.4 g/dL (ref 6.4–8.2)

## 2013-10-30 ENCOUNTER — Telehealth: Payer: Self-pay | Admitting: Family Medicine

## 2013-10-30 NOTE — Telephone Encounter (Signed)
Spoke to pt and informed her Dr Deborra Medina is out of office today. Informed pt she has not been seen since 11/2012 and will need an ov for a referral. Ov sched for 06/22

## 2013-10-30 NOTE — Telephone Encounter (Signed)
Pt called wanting to know if dr Craige Cotta called to see if dr Deborra Medina would sent her up with a pulmonary dr. Abbott Pao stated dr Craige Cotta thought she had copd She would like to go the [facility] office  asap

## 2013-11-05 ENCOUNTER — Encounter: Payer: Self-pay | Admitting: Family Medicine

## 2013-11-05 ENCOUNTER — Ambulatory Visit (INDEPENDENT_AMBULATORY_CARE_PROVIDER_SITE_OTHER): Payer: Medicare Other | Admitting: Family Medicine

## 2013-11-05 VITALS — BP 126/74 | HR 75 | Temp 97.7°F | Ht 64.0 in | Wt 238.8 lb

## 2013-11-05 DIAGNOSIS — Z23 Encounter for immunization: Secondary | ICD-10-CM

## 2013-11-05 DIAGNOSIS — Z1231 Encounter for screening mammogram for malignant neoplasm of breast: Secondary | ICD-10-CM

## 2013-11-05 DIAGNOSIS — R0602 Shortness of breath: Secondary | ICD-10-CM

## 2013-11-05 DIAGNOSIS — J449 Chronic obstructive pulmonary disease, unspecified: Secondary | ICD-10-CM | POA: Insufficient documentation

## 2013-11-05 MED ORDER — ALBUTEROL SULFATE HFA 108 (90 BASE) MCG/ACT IN AERS: 2.0000 | INHALATION_SPRAY | Freq: Four times a day (QID) | RESPIRATORY_TRACT | Status: DC | PRN

## 2013-11-05 NOTE — Patient Instructions (Signed)
Good to see you. Please stop by to see Rosaria Ferries on your way out.  Use proair inhaler as needed for shortness of breath.

## 2013-11-05 NOTE — Addendum Note (Signed)
Addended by: Modena Nunnery on: 11/05/2013 10:20 AM   Modules accepted: Orders

## 2013-11-05 NOTE — Progress Notes (Signed)
Subjective:   Patient ID: Kristie Byrd, female    DOB: 10-24-1945, 68 y.o.   MRN: [id number]  Kristie Byrd is a pleasant 68 y.o. year old female who presents to clinic today with Shortness of Breath  on 11/05/2013  HPI: Here for ? COPD.  She is also due for mammogram and Td.  I have not seen her since shortly after she established care with me last year.  Does go to cancer center for anemia.  Saw her hematologist, Dr. Craige Cotta, who told her she thought she had COPD.  Told her her "lungs" sound good but wants a referral to pulmnologist.  Quit smoking over 15 years ago.  For years, persistent SOB, worsened with exertion.  Thought it was her weight- not improved with a little weight loss. No CP.  Lab Results  Component Value Date   WBC 6.0 08/16/2012   HGB 9.8* 08/16/2012   HCT 29.7* 08/16/2012   MCV 78.7 08/16/2012   PLT 307.0 08/16/2012   Lab Results  Component Value Date   TSH 1.40 08/16/2012    Wt Readings from Last 3 Encounters:  11/05/13 238 lb 12 oz (108.296 kg)  02/06/13 245 lb (111.131 kg)  01/25/13 245 lb (111.131 kg)   Current Outpatient Prescriptions on File Prior to Visit  Medication Sig Dispense Refill  . Acetaminophen (TYLENOL ARTHRITIS PAIN PO) Take by mouth.      Marland Kitchen aspirin 81 MG tablet Take 81 mg by mouth daily.      . Cholecalciferol (VITAMIN D3) 2000 UNITS TABS Take one by mouth daily      . cyanocobalamin (,VITAMIN B-12,) 1000 MCG/ML injection Inject 1,000 mcg into the muscle every 30 (thirty) days.      Marland Kitchen escitalopram (LEXAPRO) 20 MG tablet take 1 tablet by mouth once daily  30 tablet  6  . lisinopril-hydrochlorothiazide (PRINZIDE,ZESTORETIC) 10-12.5 MG per tablet take 1 tablet by mouth once daily Please schedule an appointment with Dr, having fasting labs prior, for any refills.  90 tablet  0  . Multiple Vitamin (MULTIVITAMIN) tablet Take 1 tablet by mouth daily.      . ranitidine (ZANTAC) 150 MG capsule Take 150 mg by mouth daily.       No current  facility-administered medications on file prior to visit.    Allergies  Allergen Reactions  . Codeine Nausea And Vomiting  . Demerol [Meperidine] Nausea And Vomiting  . Fleet Phospho-Soda [Sodium Phosphates]     Vomiting   . Prednisone Other (See Comments)    Chest pressure    Past Medical History  Diagnosis Date  . Hypertension   . Depression   . GERD (gastroesophageal reflux disease)   . Arthritis   . Colon polyps   . Iron deficiency anemia 09/27/2012    Past Surgical History  Procedure Laterality Date  . Tonsillectomy    . Knee surgery Right   . Breast biopsy Bilateral   . Nose surgery    . Colonoscopy  multiple    No family history on file.  History   Social History  . Marital Status: Divorced    Spouse Name: N/A    Number of Children: 2  . Years of Education: N/A   Occupational History  . retired    Social History Main Topics  . Smoking status: Former Smoker    Types: Cigarettes  . Smokeless tobacco: Never Used  . Alcohol Use: No  . Drug Use: No  . Sexual Activity: Not  on file   Other Topics Concern  . Not on file   Social History Narrative   Lives alone.  Divorced. Two daughters- in 51s, 3 grandchildren.   Does not know family history- raised in [place] [facility] [place].        Desires CPR.  Does not have HPOA.   She would possibly want life support if for short period of time . Unsure about feeding tube.   The PMH, PSH, Social History, Family History, Medications, and allergies have been reviewed in [facility], and have been updated if relevant.   Review of Systems    See HPI No cough No fevers Objective:    BP 126/74  Pulse 75  Temp(Src) 97.7 F (36.5 C) (Oral)  Ht 5\' 4"  (1.626 m)  Wt 238 lb 12 oz (108.296 kg)  BMI 40.96 kg/m2  SpO2 95%   Physical Exam Gen:  Alert, pleasant, NAD Resp: CTA bilaterally No increased WOB CVS:  RRR Ext:  No edema      Assessment & Plan:   SOB (shortness of breath) No Follow-up on file.

## 2013-11-05 NOTE — Assessment & Plan Note (Signed)
Declining CXR or blood work today (CBC, TSH). eRx sent for prn albuterol. Lung exam reassuring. She is a former smoker- would likely benefit from inhaled steroid but she is allergic to prednisone. Will refer to pulm for pulmonary function tests and tx.

## 2013-11-05 NOTE — Progress Notes (Signed)
Pre visit review using our clinic review tool, if applicable. No additional management support is needed unless otherwise documented below in the visit note. 

## 2013-11-14 ENCOUNTER — Ambulatory Visit: Payer: Self-pay | Admitting: Hematology and Oncology

## 2013-11-14 ENCOUNTER — Ambulatory Visit: Payer: Self-pay | Admitting: Family Medicine

## 2013-11-15 ENCOUNTER — Telehealth: Payer: Self-pay | Admitting: Pulmonary Disease

## 2013-11-15 ENCOUNTER — Encounter: Payer: Self-pay | Admitting: Family Medicine

## 2013-11-15 ENCOUNTER — Ambulatory Visit (INDEPENDENT_AMBULATORY_CARE_PROVIDER_SITE_OTHER): Payer: Medicare Other | Admitting: Pulmonary Disease

## 2013-11-15 ENCOUNTER — Encounter: Payer: Self-pay | Admitting: Pulmonary Disease

## 2013-11-15 VITALS — BP 128/70 | HR 96 | Ht 64.0 in | Wt 227.0 lb

## 2013-11-15 DIAGNOSIS — R0602 Shortness of breath: Secondary | ICD-10-CM

## 2013-11-15 NOTE — Progress Notes (Signed)
Subjective:    Patient ID: Kristie Byrd, female    DOB: 15-Jun-1945, 67 y.o.   MRN: [id number]  HPI  This is a very pleasant 68 year old female with a past medical history significant for anemia who comes to my clinic today for evaluation of possible COPD. She says that she has no shortness of breath on exertion with minimal activity for the last several years. However, she attributed that just to her obesity. She was told recently had a doctor's appointment that she was wheezing and that she may have COPD so the recommended that she come to see me for further evaluation. She has used albuterol on an as-needed basis lately and she says that it does seem to help her breathing significantly. She says that when she exerts herself she may feel some chest tightness and wheezing. She previously smoked one pack of cigarettes daily for 37 years and quit in 1998. She does not actively smoke. To her knowledge she does not have cardiac disease.  Past Medical History  Diagnosis Date  . Hypertension   . Depression   . GERD (gastroesophageal reflux disease)   . Arthritis   . Colon polyps   . Iron deficiency anemia 09/27/2012     No family history on file.   History   Social History  . Marital Status: Divorced    Spouse Name: N/A    Number of Children: 2  . Years of Education: N/A   Occupational History  . retired    Social History Main Topics  . Smoking status: Former Smoker -- 1.00 packs/day for 37 years    Types: Cigarettes    Quit date: 11/15/1996  . Smokeless tobacco: Never Used  . Alcohol Use: No  . Drug Use: No  . Sexual Activity: Not on file   Other Topics Concern  . Not on file   Social History Narrative   Lives alone.  Divorced. Two daughters- in 61s, 3 grandchildren.   Does not know family history- raised in [place] [facility].        Desires CPR.  Does not have HPOA.   She would possibly want life support if for short period of time . Unsure about feeding tube.       Allergies  Allergen Reactions  . Codeine Nausea And Vomiting  . Demerol [Meperidine] Nausea And Vomiting  . Fleet Phospho-Soda [Sodium Phosphates]     Vomiting   . Prednisone Other (See Comments)    Chest pressure     Outpatient Prescriptions Prior to Visit  Medication Sig Dispense Refill  . Acetaminophen (TYLENOL ARTHRITIS PAIN PO) Take by mouth.      Marland Kitchen albuterol (PROVENTIL HFA;VENTOLIN HFA) 108 (90 BASE) MCG/ACT inhaler Inhale 2 puffs into the lungs every 6 (six) hours as needed for wheezing or shortness of breath.  1 Inhaler  0  . aspirin 81 MG tablet Take 81 mg by mouth daily.      . Cholecalciferol (VITAMIN D3) 2000 UNITS TABS Take one by mouth daily      . cyanocobalamin (,VITAMIN B-12,) 1000 MCG/ML injection Inject 1,000 mcg into the muscle every 30 (thirty) days.      Marland Kitchen escitalopram (LEXAPRO) 20 MG tablet take 1 tablet by mouth once daily  30 tablet  6  . lisinopril-hydrochlorothiazide (PRINZIDE,ZESTORETIC) 10-12.5 MG per tablet take 1 tablet by mouth once daily Please schedule an appointment with Dr, having fasting labs prior, for any refills.  90 tablet  0  .  Multiple Vitamin (MULTIVITAMIN) tablet Take 1 tablet by mouth daily.      . ranitidine (ZANTAC) 150 MG capsule Take 150 mg by mouth daily.       No facility-administered medications prior to visit.      Review of Systems  Constitutional: Negative for fever and unexpected weight change.  HENT: Positive for congestion. Negative for dental problem, ear pain, nosebleeds, postnasal drip, rhinorrhea, sinus pressure, sneezing, sore throat and trouble swallowing.   Eyes: Negative for redness and itching.  Respiratory: Positive for shortness of breath and wheezing. Negative for cough and chest tightness.   Cardiovascular: Negative for palpitations and leg swelling.  Gastrointestinal: Negative for nausea and vomiting.  Genitourinary: Negative for dysuria.  Musculoskeletal: Negative for joint swelling.  Skin: Negative for  rash.  Neurological: Negative for headaches.  Hematological: Does not bruise/bleed easily.  Psychiatric/Behavioral: Negative for dysphoric mood. The patient is not nervous/anxious.        Objective:   Physical Exam Filed Vitals:   11/15/13 1434  BP: 128/70  Pulse: 96  Height: 5\' 4"  (1.626 m)  Weight: 227 lb (102.967 kg)  SpO2: 95%  RA  Gen: obese but well appearing, no acute distress HEENT: NCAT, PERRL, EOMi, OP clear, neck supple without masses PULM: CTA B CV: RRR, slight systolic murmur, no JVD AB: BS+, soft, nontender, no hsm Ext: warm, trace edema, no clubbing, no cyanosis Derm: no rash or skin breakdown Neuro: A&Ox4, CN II-XII intact, strength 5/5 in all 4 extremities       Assessment & Plan:   COPD (chronic obstructive pulmonary disease) COPD: GOLD Grade A Combined recommendations from the [place], [employer] of Chest Physicians, Investment banker, corporate, European Respiratory Society (Qaseem A et al, Ann Intern Med. 2011;155(3):179) recommends tobacco cessation, pulmonary rehab (for symptomatic patients with an FEV1 < 50% predicted), supplemental oxygen (for patients with SaO2 <88% or paO2 <55), and appropriate bronchodilator therapy.  In regards to long acting bronchodilators, they recommend monotherapy (FEV1 60-80% with symptoms weak evidence, FEV1 with symptoms <60% strong evidence), or combination therapy (FEV1 <60% with symptoms, strong recommendation, moderate evidence).  One should also provide patients with annual immunizations and consider therapy for prevention of COPD exacerbations (ie. roflumilast or azithromycin) when appopriate.  -O2 therapy: Not indicated -Immunizations: Up-to-date -Tobacco use: Not actively smoking -Exercise: Encouraged regular exercise  -Bronchodilator therapy: Start Spiriva for a trial to see if it helps, though given her relatively mild symptoms she could just use a short acting bronchodilator as needed.  She will let us know if the Spiriva helps -Exacerbation prevention: Get a flu shot every year    Updated Medication List Outpatient Encounter Prescriptions as of 11/15/2013  Medication Sig  . Acetaminophen (TYLENOL ARTHRITIS PAIN PO) Take by mouth.  Marland Kitchen albuterol (PROVENTIL HFA;VENTOLIN HFA) 108 (90 BASE) MCG/ACT inhaler Inhale 2 puffs into the lungs every 6 (six) hours as needed for wheezing or shortness of breath.  Marland Kitchen aspirin 81 MG tablet Take 81 mg by mouth daily.  . Cholecalciferol (VITAMIN D3) 2000 UNITS TABS Take one by mouth daily  . cyanocobalamin (,VITAMIN B-12,) 1000 MCG/ML injection Inject 1,000 mcg into the muscle every 30 (thirty) days.  Marland Kitchen escitalopram (LEXAPRO) 20 MG tablet take 1 tablet by mouth once daily  . lisinopril-hydrochlorothiazide (PRINZIDE,ZESTORETIC) 10-12.5 MG per tablet take 1 tablet by mouth once daily Please schedule an appointment with Dr, having fasting labs prior, for any refills.  . Multiple Vitamin (MULTIVITAMIN) tablet Take 1 tablet  by mouth daily.  . [DISCONTINUED] ranitidine (ZANTAC) 150 MG capsule Take 150 mg by mouth daily.

## 2013-11-15 NOTE — Assessment & Plan Note (Addendum)
COPD: GOLD Grade A Combined recommendations from the [employer], [employer], Investment banker, corporate, [place] (Qaseem A et al, Ann Intern Med. 2011;155(3):179) recommends tobacco cessation, pulmonary rehab (for symptomatic patients with an FEV1 < 50% predicted), supplemental oxygen (for patients with SaO2 <88% or paO2 <55), and appropriate bronchodilator therapy.  In regards to long acting bronchodilators, they recommend monotherapy (FEV1 60-80% with symptoms weak evidence, FEV1 with symptoms <60% strong evidence), or combination therapy (FEV1 <60% with symptoms, strong recommendation, moderate evidence).  One should also provide patients with annual immunizations and consider therapy for prevention of COPD exacerbations (ie. roflumilast or azithromycin) when appopriate.  -O2 therapy: Not indicated -Immunizations: Up-to-date -Tobacco use: Not actively smoking -Exercise: Encouraged regular exercise  -Bronchodilator therapy: Start Spiriva for a trial to see if it helps, though given her relatively mild symptoms she could just use a short acting bronchodilator as needed. She will let us know if the Spiriva helps -Exacerbation prevention: Get a flu shot every year

## 2013-11-15 NOTE — Telephone Encounter (Signed)
Error

## 2013-11-15 NOTE — Patient Instructions (Signed)
You have COPD Get a flu shot in the fall Stay active Try Spiriva 2 puffs daily and let us know if it helps We will set up a Chest X-ray We will see you back in 6 months or sooner if needed

## 2013-12-01 ENCOUNTER — Other Ambulatory Visit: Payer: Self-pay | Admitting: Family Medicine

## 2013-12-03 ENCOUNTER — Telehealth: Payer: Self-pay | Admitting: Pulmonary Disease

## 2013-12-03 ENCOUNTER — Telehealth: Payer: Self-pay

## 2013-12-03 MED ORDER — ALBUTEROL SULFATE HFA 108 (90 BASE) MCG/ACT IN AERS: 2.0000 | INHALATION_SPRAY | Freq: Four times a day (QID) | RESPIRATORY_TRACT | Status: DC | PRN

## 2013-12-03 NOTE — Telephone Encounter (Signed)
LMTCB x 1 

## 2013-12-03 NOTE — Telephone Encounter (Signed)
Sure, OK to switch

## 2013-12-03 NOTE — Telephone Encounter (Signed)
Pt left v/m requesting spiriva and albuterol from Dr Lake Bells. Spoke with pt and she recently saw Dr Lake Bells and thought spiriva and albuterol were going to be filled. Gave pt Dr Kathee Delton office [phone] and transferred pt to Dr Anastasia Pall office.

## 2013-12-03 NOTE — Telephone Encounter (Signed)
Called and spoke with pt and she stated that she has been using the spiriva respimat and this is not working very well for her.  She is wanting to know if she can change to the spiriva handihaler.  BQ please advise. Thanks  Allergies  Allergen Reactions  . Codeine Nausea And Vomiting  . Demerol [Meperidine] Nausea And Vomiting  . Fleet Phospho-Soda [Sodium Phosphates]     Vomiting   . Prednisone Other (See Comments)    Chest pressure    Current Outpatient Prescriptions on File Prior to Visit  Medication Sig Dispense Refill  . Acetaminophen (TYLENOL ARTHRITIS PAIN PO) Take by mouth.      Marland Kitchen albuterol (PROVENTIL HFA;VENTOLIN HFA) 108 (90 BASE) MCG/ACT inhaler Inhale 2 puffs into the lungs every 6 (six) hours as needed for wheezing or shortness of breath.  1 Inhaler  0  . aspirin 81 MG tablet Take 81 mg by mouth daily.      . Cholecalciferol (VITAMIN D3) 2000 UNITS TABS Take one by mouth daily      . cyanocobalamin (,VITAMIN B-12,) 1000 MCG/ML injection Inject 1,000 mcg into the muscle every 30 (thirty) days.      Marland Kitchen escitalopram (LEXAPRO) 20 MG tablet take 1 tablet by mouth once daily  30 tablet  6  . lisinopril-hydrochlorothiazide (PRINZIDE,ZESTORETIC) 10-12.5 MG per tablet take 1 tablet by mouth once daily Please schedule an appointment with Dr, having fasting labs prior, for any refills.  90 tablet  0  . Multiple Vitamin (MULTIVITAMIN) tablet Take 1 tablet by mouth daily.      Marland Kitchen PROAIR HFA 108 (90 BASE) MCG/ACT inhaler inhale 2 puffs by mouth every 6 hours if needed for wheezing or shortness of breath  8.5 g  0   No current facility-administered medications on file prior to visit.

## 2013-12-03 NOTE — Addendum Note (Signed)
Addended by: Modena Nunnery on: 12/03/2013 12:32 PM   Modules accepted: Orders

## 2013-12-04 MED ORDER — TIOTROPIUM BROMIDE MONOHYDRATE 18 MCG IN CAPS: 18.0000 ug | ORAL_CAPSULE | Freq: Every day | RESPIRATORY_TRACT | Status: DC

## 2013-12-04 NOTE — Telephone Encounter (Signed)
Pt aware of recs. RX sent. Nothing further needed 

## 2013-12-04 NOTE — Telephone Encounter (Signed)
lmtcb x2 

## 2013-12-04 NOTE — Telephone Encounter (Signed)
Pt has returned call. °

## 2013-12-21 ENCOUNTER — Other Ambulatory Visit: Payer: Self-pay | Admitting: Family Medicine

## 2014-01-22 ENCOUNTER — Other Ambulatory Visit: Payer: Self-pay | Admitting: Family Medicine

## 2014-01-22 NOTE — Telephone Encounter (Signed)
Last filled 06/2013

## 2014-02-05 ENCOUNTER — Telehealth: Payer: Self-pay | Admitting: Pulmonary Disease

## 2014-02-05 NOTE — Telephone Encounter (Signed)
OK by me 

## 2014-02-05 NOTE — Telephone Encounter (Signed)
Called spoke with pt. She is asking to use the spiriva respimat again. She does not feel like the spiriva handihaler gives her full the medication. Please advise BQ thanks

## 2014-02-06 MED ORDER — TIOTROPIUM BROMIDE MONOHYDRATE 2.5 MCG/ACT IN AERS: 2.0000 | INHALATION_SPRAY | Freq: Every day | RESPIRATORY_TRACT | Status: DC

## 2014-02-06 NOTE — Telephone Encounter (Signed)
Pt aware RX sent in. Nothing further needed 

## 2014-02-07 ENCOUNTER — Telehealth: Payer: Self-pay | Admitting: Family Medicine

## 2014-02-07 NOTE — Telephone Encounter (Signed)
error 

## 2014-02-08 ENCOUNTER — Encounter: Payer: Self-pay | Admitting: Family Medicine

## 2014-02-08 ENCOUNTER — Ambulatory Visit (INDEPENDENT_AMBULATORY_CARE_PROVIDER_SITE_OTHER): Payer: Medicare Other | Admitting: Family Medicine

## 2014-02-08 VITALS — BP 124/64 | HR 98 | Temp 97.8°F | Wt 237.5 lb

## 2014-02-08 DIAGNOSIS — M25569 Pain in unspecified knee: Secondary | ICD-10-CM

## 2014-02-08 DIAGNOSIS — M25561 Pain in right knee: Secondary | ICD-10-CM | POA: Insufficient documentation

## 2014-02-08 DIAGNOSIS — M25552 Pain in left hip: Secondary | ICD-10-CM

## 2014-02-08 DIAGNOSIS — M25559 Pain in unspecified hip: Secondary | ICD-10-CM

## 2014-02-08 MED ORDER — MELOXICAM 15 MG PO TABS: 15.0000 mg | ORAL_TABLET | Freq: Every day | ORAL | Status: DC

## 2014-02-08 NOTE — Assessment & Plan Note (Signed)
New- probable bursitis from gait compensation due to right knee pain.

## 2014-02-08 NOTE — Patient Instructions (Signed)
Good to see you. Take 1/2 to 1 tablet of meloxicam daily with food - for next two weeks. Ok to take tylenol with this.

## 2014-02-08 NOTE — Assessment & Plan Note (Signed)
Deteriorated. Likely has OA as well as chronic meniscal changes. Will start po meloxicam. Declined xray today due to cost.

## 2014-02-08 NOTE — Progress Notes (Signed)
Pre visit review using our clinic review tool, if applicable. No additional management support is needed unless otherwise documented below in the visit note. 

## 2014-02-08 NOTE — Progress Notes (Signed)
Subjective:   Patient ID: Kristie Byrd, female    DOB: 05-29-1945, 68 y.o.   MRN: [id number]  Kristie Byrd is a pleasant 68 y.o. year old female who presents to clinic today with Knee Pain and Hip Pain  on 02/08/2014  HPI: Right knee pain- S/p meniscal surgery by Dr. Rudene Christians a few years ago.  H/o two torn menisci. Taking two tylenol arthritis daily.  Pain has been ongoing for years, worse past several months. No recent injuries. Has noticed it has been more swollen. No warmth or erythema.  H/o synvisc injections.  Cannot tolerate steroids.  Current Outpatient Prescriptions on File Prior to Visit  Medication Sig Dispense Refill  . Acetaminophen (TYLENOL ARTHRITIS PAIN PO) Take by mouth.      Marland Kitchen albuterol (PROVENTIL HFA;VENTOLIN HFA) 108 (90 BASE) MCG/ACT inhaler Inhale 2 puffs into the lungs every 6 (six) hours as needed for wheezing or shortness of breath.  1 Inhaler  0  . aspirin 81 MG tablet Take 81 mg by mouth daily.      . Cholecalciferol (VITAMIN D3) 2000 UNITS TABS Take one by mouth daily      . cyanocobalamin (,VITAMIN B-12,) 1000 MCG/ML injection Inject 1,000 mcg into the muscle every 30 (thirty) days.      Marland Kitchen escitalopram (LEXAPRO) 20 MG tablet take 1 tablet by mouth once daily  30 tablet  6  . lisinopril-hydrochlorothiazide (PRINZIDE,ZESTORETIC) 10-12.5 MG per tablet Take 1 tablet by mouth daily. Needs to schedule for a physical, having fasting labs prior, for more refills.  90 tablet  0  . Multiple Vitamin (MULTIVITAMIN) tablet Take 1 tablet by mouth daily.      Marland Kitchen PROAIR HFA 108 (90 BASE) MCG/ACT inhaler inhale 2 puffs by mouth every 6 hours if needed for wheezing or shortness of breath  8.5 g  0  . Tiotropium Bromide Monohydrate (SPIRIVA RESPIMAT) 2.5 MCG/ACT AERS Inhale 2 puffs into the lungs daily.  4 g  3   No current facility-administered medications on file prior to visit.    Allergies  Allergen Reactions  . Codeine Nausea And Vomiting  . Demerol [Meperidine]  Nausea And Vomiting  . Fleet Phospho-Soda [Sodium Phosphates]     Vomiting   . Prednisone Other (See Comments)    Chest pressure    Past Medical History  Diagnosis Date  . Hypertension   . Depression   . GERD (gastroesophageal reflux disease)   . Arthritis   . Colon polyps   . Iron deficiency anemia 09/27/2012    Past Surgical History  Procedure Laterality Date  . Tonsillectomy    . Knee surgery Right   . Breast biopsy Bilateral   . Nose surgery    . Colonoscopy  multiple    No family history on file.  History   Social History  . Marital Status: Divorced    Spouse Name: N/A    Number of Children: 2  . Years of Education: N/A   Occupational History  . retired    Social History Main Topics  . Smoking status: Former Smoker -- 1.00 packs/day for 37 years    Types: Cigarettes    Quit date: 11/15/1996  . Smokeless tobacco: Never Used  . Alcohol Use: No  . Drug Use: No  . Sexual Activity: Not on file   Other Topics Concern  . Not on file   Social History Narrative   Lives alone.  Divorced. Two daughters- in 37s, 3 grandchildren.  Does not know family history- raised in [facility].        Desires CPR.  Does not have HPOA.   She would possibly want life support if for short period of time . Unsure about feeding tube.   The PMH, PSH, Social History, Family History, Medications, and allergies have been reviewed in [facility], and have been updated if relevant.   Review of Systems    See HPI No LE edema No TTP No rashes No warmth +left hip pain for past few weeks Objective:    BP 124/64  Pulse 98  Temp(Src) 97.8 F (36.6 C) (Oral)  Wt 237 lb 8 oz (107.729 kg)  SpO2 96%   Physical Exam  Nursing note and vitals reviewed. Constitutional: She is oriented to person, place, and time. She appears well-developed and well-nourished. No distress.  obese  Musculoskeletal:       Right knee: She exhibits swelling and effusion. She exhibits normal range  of motion, no erythema and no LCL laxity. Tenderness found. Medial joint line tenderness noted.  Neurological: She is alert and oriented to person, place, and time. No cranial nerve deficit.  Skin: Skin is warm and dry.  Psychiatric: She has a normal mood and affect. Her speech is normal and behavior is normal. Judgment and thought content normal.          Assessment & Plan:   Right knee pain  Left hip pain No Follow-up on file.

## 2014-02-25 ENCOUNTER — Ambulatory Visit: Payer: Self-pay | Admitting: Oncology

## 2014-02-25 LAB — CBC CANCER CENTER
BASOS PCT: 1 %
Basophil #: 0.1 x10 3/mm (ref 0.0–0.1)
Eosinophil #: 0 x10 3/mm (ref 0.0–0.7)
Eosinophil %: 0.5 %
HCT: 34.9 % — ABNORMAL LOW (ref 35.0–47.0)
HGB: 11.4 g/dL — ABNORMAL LOW (ref 12.0–16.0)
LYMPHS ABS: 1.1 x10 3/mm (ref 1.0–3.6)
Lymphocyte %: 19.9 %
MCH: 28.9 pg (ref 26.0–34.0)
MCHC: 32.6 g/dL (ref 32.0–36.0)
MCV: 89 fL (ref 80–100)
MONO ABS: 0.5 x10 3/mm (ref 0.2–0.9)
Monocyte %: 8.9 %
Neutrophil #: 3.9 x10 3/mm (ref 1.4–6.5)
Neutrophil %: 69.7 %
Platelet: 256 x10 3/mm (ref 150–440)
RBC: 3.93 10*6/uL (ref 3.80–5.20)
RDW: 14.7 % — ABNORMAL HIGH (ref 11.5–14.5)
WBC: 5.6 x10 3/mm (ref 3.6–11.0)

## 2014-02-25 LAB — COMPREHENSIVE METABOLIC PANEL
ALK PHOS: 91 U/L
Albumin: 3.6 g/dL (ref 3.4–5.0)
Anion Gap: 7 (ref 7–16)
BILIRUBIN TOTAL: 0.2 mg/dL (ref 0.2–1.0)
BUN: 24 mg/dL — ABNORMAL HIGH (ref 7–18)
CHLORIDE: 102 mmol/L (ref 98–107)
CREATININE: 1.28 mg/dL (ref 0.60–1.30)
Calcium, Total: 9 mg/dL (ref 8.5–10.1)
Co2: 28 mmol/L (ref 21–32)
EGFR (African American): 54 — ABNORMAL LOW
GFR CALC NON AF AMER: 44 — AB
Glucose: 124 mg/dL — ABNORMAL HIGH (ref 65–99)
OSMOLALITY: 279 (ref 275–301)
Potassium: 4.2 mmol/L (ref 3.5–5.1)
SGOT(AST): 15 U/L (ref 15–37)
SGPT (ALT): 23 U/L
Sodium: 137 mmol/L (ref 136–145)
TOTAL PROTEIN: 6.7 g/dL (ref 6.4–8.2)

## 2014-03-09 ENCOUNTER — Other Ambulatory Visit: Payer: Self-pay | Admitting: Family Medicine

## 2014-03-17 ENCOUNTER — Ambulatory Visit: Payer: Self-pay | Admitting: Oncology

## 2014-04-02 ENCOUNTER — Other Ambulatory Visit: Payer: Self-pay | Admitting: Family Medicine

## 2014-06-18 ENCOUNTER — Other Ambulatory Visit: Payer: Self-pay | Admitting: Family Medicine

## 2014-06-19 ENCOUNTER — Encounter: Payer: Self-pay | Admitting: Family Medicine

## 2014-06-19 ENCOUNTER — Ambulatory Visit (INDEPENDENT_AMBULATORY_CARE_PROVIDER_SITE_OTHER): Payer: PPO | Admitting: Family Medicine

## 2014-06-19 VITALS — BP 130/68 | HR 105 | Temp 97.7°F | Wt 242.0 lb

## 2014-06-19 DIAGNOSIS — Z13220 Encounter for screening for lipoid disorders: Secondary | ICD-10-CM

## 2014-06-19 DIAGNOSIS — D509 Iron deficiency anemia, unspecified: Secondary | ICD-10-CM

## 2014-06-19 DIAGNOSIS — F32A Depression, unspecified: Secondary | ICD-10-CM

## 2014-06-19 DIAGNOSIS — F329 Major depressive disorder, single episode, unspecified: Secondary | ICD-10-CM

## 2014-06-19 LAB — CBC WITH DIFFERENTIAL/PLATELET
BASOS PCT: 0.4 % (ref 0.0–3.0)
Basophils Absolute: 0 10*3/uL (ref 0.0–0.1)
Eosinophils Absolute: 0.2 10*3/uL (ref 0.0–0.7)
Eosinophils Relative: 2.5 % (ref 0.0–5.0)
HCT: 32.1 % — ABNORMAL LOW (ref 36.0–46.0)
Hemoglobin: 10.8 g/dL — ABNORMAL LOW (ref 12.0–15.0)
LYMPHS ABS: 1 10*3/uL (ref 0.7–4.0)
Lymphocytes Relative: 14.9 % (ref 12.0–46.0)
MCHC: 33.6 g/dL (ref 30.0–36.0)
MCV: 85.9 fl (ref 78.0–100.0)
MONO ABS: 0.5 10*3/uL (ref 0.1–1.0)
Monocytes Relative: 7.8 % (ref 3.0–12.0)
Neutro Abs: 4.8 10*3/uL (ref 1.4–7.7)
Neutrophils Relative %: 74.4 % (ref 43.0–77.0)
Platelets: 329 10*3/uL (ref 150.0–400.0)
RBC: 3.74 Mil/uL — ABNORMAL LOW (ref 3.87–5.11)
RDW: 14 % (ref 11.5–15.5)
WBC: 6.4 10*3/uL (ref 4.0–10.5)

## 2014-06-19 LAB — COMPREHENSIVE METABOLIC PANEL WITH GFR
ALT: 13 U/L (ref 0–35)
AST: 15 U/L (ref 0–37)
Albumin: 3.9 g/dL (ref 3.5–5.2)
Alkaline Phosphatase: 99 U/L (ref 39–117)
BUN: 23 mg/dL (ref 6–23)
CO2: 29 meq/L (ref 19–32)
Calcium: 9.7 mg/dL (ref 8.4–10.5)
Chloride: 104 meq/L (ref 96–112)
Creatinine, Ser: 1.19 mg/dL (ref 0.40–1.20)
GFR: 47.99 mL/min — ABNORMAL LOW
Glucose, Bld: 105 mg/dL — ABNORMAL HIGH (ref 70–99)
Potassium: 4.5 meq/L (ref 3.5–5.1)
Sodium: 139 meq/L (ref 135–145)
Total Bilirubin: 0.3 mg/dL (ref 0.2–1.2)
Total Protein: 6.9 g/dL (ref 6.0–8.3)

## 2014-06-19 LAB — LIPID PANEL
Cholesterol: 150 mg/dL (ref 0–200)
HDL: 48.7 mg/dL
LDL Cholesterol: 70 mg/dL (ref 0–99)
NonHDL: 101.3
Total CHOL/HDL Ratio: 3
Triglycerides: 157 mg/dL — ABNORMAL HIGH (ref 0.0–149.0)
VLDL: 31.4 mg/dL (ref 0.0–40.0)

## 2014-06-19 LAB — TSH: TSH: 1.37 u[IU]/mL (ref 0.35–4.50)

## 2014-06-19 LAB — FERRITIN: Ferritin: 6.6 ng/mL — ABNORMAL LOW (ref 10.0–291.0)

## 2014-06-19 LAB — VITAMIN B12: Vitamin B-12: 1066 pg/mL — ABNORMAL HIGH (ref 211–911)

## 2014-06-19 MED ORDER — MELOXICAM 15 MG PO TABS: 15.0000 mg | ORAL_TABLET | Freq: Every day | ORAL | Status: DC

## 2014-06-19 MED ORDER — BUPROPION HCL ER (XL) 150 MG PO TB24: 150.0000 mg | ORAL_TABLET | Freq: Every day | ORAL | Status: DC

## 2014-06-19 NOTE — Progress Notes (Signed)
Pre visit review using our clinic review tool, if applicable. No additional management support is needed unless otherwise documented below in the visit note. 

## 2014-06-19 NOTE — Assessment & Plan Note (Signed)
Deteriorated. >25 minutes spent in face to face time with patient, >50% spent in counselling or coordination of care Discussed seeing a psychotherapist and psychiatrist since she has failed multiple agents in past. She feels she truly cannot afford either one at this ime. Will check labs today to rule out other contributing factors. Wean off lexapro, start wellbutrin XL 150 mg daily. Follow up in 1 month- if no improvement, I did insist she see psychiatry at that time. The patient indicates understanding of these issues and agrees with the plan.   Orders Placed This Encounter  Procedures  . TSH  . Vitamin B12  . CBC with Differential/Platelet  . Comprehensive metabolic panel  . Lipid panel  . Ferritin

## 2014-06-19 NOTE — Progress Notes (Signed)
Subjective:   Patient ID: Kristie Byrd, female    DOB: 27-Jun-1945, 69 y.o.   MRN: [id number]  Kristie Byrd is a pleasant 69 y.o. year old female who presents to clinic today with her daughter Follow-up  on 06/19/2014  HPI:  Depression- had been on celexa 10 mg daily for years. She felt it was not working.  Previously could not tolerate prozac, paxil or zoloft but cannot remember why. We weaned off celexa in 2014, as she did not want to try increased dose and started lexapro 20 mg daily. She did feel better on Lexapro but over past year, her symptoms have deteriorated again.  Still cannot find a job-was laid off 3 years ago.  Feels "useless." Does not want to get out of bed in the morning.  Difficulty concentrating. Her sister is also dying of metastatic CA.  Daughter is very supportive.  Appetite ok but does not feel like cooking- so eats quick things that are not very healthy.  Wt Readings from Last 3 Encounters:  06/19/14 242 lb (109.77 kg)  02/08/14 237 lb 8 oz (107.729 kg)  11/15/13 227 lb (102.967 kg)   H/o iron deficiency anemia- due for labs.  Has been more fatigued lately.  Was followed by hematology but has not been back to them in awhile.  Lab Results  Component Value Date   WBC 6.0 08/16/2012   HGB 9.8* 08/16/2012   HCT 29.7* 08/16/2012   MCV 78.7 08/16/2012   PLT 307.0 08/16/2012     Current Outpatient Prescriptions on File Prior to Visit  Medication Sig Dispense Refill  . Acetaminophen (TYLENOL ARTHRITIS PAIN PO) Take by mouth.    Marland Kitchen aspirin 81 MG tablet Take 81 mg by mouth daily.    . Cholecalciferol (VITAMIN D3) 2000 UNITS TABS Take one by mouth daily    . cyanocobalamin (,VITAMIN B-12,) 1000 MCG/ML injection Inject 1,000 mcg into the muscle every 30 (thirty) days.    Marland Kitchen escitalopram (LEXAPRO) 20 MG tablet take 1 tablet by mouth once daily 30 tablet 6  . lisinopril-hydrochlorothiazide (PRINZIDE,ZESTORETIC) 10-12.5 MG per tablet take 1 tablet by mouth  once daily (PLEASE SCHEDULE LABS FOR ADDITIONAL REFILLS) 90 tablet 0  . Multiple Vitamin (MULTIVITAMIN) tablet Take 1 tablet by mouth daily.    Marland Kitchen PROAIR HFA 108 (90 BASE) MCG/ACT inhaler inhale 2 puffs by mouth every 6 hours if needed for wheezing or shortness of breath 8.5 g 0  . Tiotropium Bromide Monohydrate (SPIRIVA RESPIMAT) 2.5 MCG/ACT AERS Inhale 2 puffs into the lungs daily. 4 g 3   No current facility-administered medications on file prior to visit.    Allergies  Allergen Reactions  . Codeine Nausea And Vomiting  . Demerol [Meperidine] Nausea And Vomiting  . Fleet Phospho-Soda [Sodium Phosphates]     Vomiting   . Prednisone Other (See Comments)    Chest pressure    Past Medical History  Diagnosis Date  . Hypertension   . Depression   . GERD (gastroesophageal reflux disease)   . Arthritis   . Colon polyps   . Iron deficiency anemia 09/27/2012    Past Surgical History  Procedure Laterality Date  . Tonsillectomy    . Knee surgery Right   . Breast biopsy Bilateral   . Nose surgery    . Colonoscopy  multiple    No family history on file.  History   Social History  . Marital Status: Single    Spouse Name: N/A  Number of Children: 2  . Years of Education: N/A   Occupational History  . retired    Social History Main Topics  . Smoking status: Former Smoker -- 1.00 packs/day for 37 years    Types: Cigarettes    Quit date: 11/15/1996  . Smokeless tobacco: Never Used  . Alcohol Use: No  . Drug Use: No  . Sexual Activity: Not on file   Other Topics Concern  . Not on file   Social History Narrative   Lives alone.  Divorced. Two daughters- in 110s, 3 grandchildren.   Does not know family history- raised in [facility].        Desires CPR.  Does not have HPOA.   She would possibly want life support if for short period of time . Unsure about feeding tube.   The PMH, PSH, Social History, Family History, Medications, and allergies have been  reviewed in [facility], and have been updated if relevant.  Review of Systems  Constitutional: Positive for fatigue.  Respiratory: Negative.   Cardiovascular: Negative.   Psychiatric/Behavioral: Positive for dysphoric mood and decreased concentration. Negative for suicidal ideas, hallucinations, behavioral problems, confusion, sleep disturbance, self-injury and agitation. The patient is not nervous/anxious and is not hyperactive.        Objective:    BP 130/68 mmHg  Pulse 105  Temp(Src) 97.7 F (36.5 C) (Oral)  Wt 242 lb (109.77 kg)  SpO2 95%   Physical Exam  Constitutional: She is oriented to person, place, and time. She appears well-developed and well-nourished. No distress.  HENT:  Head: Normocephalic.  Eyes: Conjunctivae are normal.  Neck: Normal range of motion.  Cardiovascular: Normal rate.   Pulmonary/Chest: Effort normal.  Neurological: She is alert and oriented to person, place, and time. No cranial nerve deficit.  Skin: Skin is warm and dry.  Psychiatric: She has a normal mood and affect. Her behavior is normal. Judgment and thought content normal.  tearful  Nursing note and vitals reviewed.         Assessment & Plan:   Depression - Plan: TSH, Vitamin B12, CBC with Differential/Platelet, Comprehensive metabolic panel  Screening cholesterol level - Plan: Lipid panel  Iron deficiency anemia - Plan: Ferritin No Follow-up on file.

## 2014-06-19 NOTE — Patient Instructions (Signed)
Good to see you. To wean off lexapro- take 1 tablet every other day for 1 week, then 1/2 tablet every other day for 1 week and stop if you tolerate it ok.  Please come see me in 1 month with an update.

## 2014-06-24 ENCOUNTER — Other Ambulatory Visit: Payer: Self-pay | Admitting: Family Medicine

## 2014-06-24 MED ORDER — FERROUS SULFATE 325 (65 FE) MG PO TABS: 325.0000 mg | ORAL_TABLET | Freq: Every day | ORAL | Status: DC

## 2014-07-01 ENCOUNTER — Other Ambulatory Visit: Payer: Self-pay | Admitting: Family Medicine

## 2014-07-18 ENCOUNTER — Encounter: Payer: Self-pay | Admitting: Family Medicine

## 2014-07-18 ENCOUNTER — Ambulatory Visit (INDEPENDENT_AMBULATORY_CARE_PROVIDER_SITE_OTHER): Payer: PPO | Admitting: Family Medicine

## 2014-07-18 VITALS — BP 118/66 | HR 81 | Temp 97.5°F | Wt 244.0 lb

## 2014-07-18 DIAGNOSIS — F329 Major depressive disorder, single episode, unspecified: Secondary | ICD-10-CM

## 2014-07-18 DIAGNOSIS — F32A Depression, unspecified: Secondary | ICD-10-CM

## 2014-07-18 MED ORDER — BUPROPION HCL ER (XL) 300 MG PO TB24: 300.0000 mg | ORAL_TABLET | Freq: Every day | ORAL | Status: DC

## 2014-07-18 NOTE — Assessment & Plan Note (Signed)
Improving. >15 minutes spent in face to face time with patient, >50% spent in counselling or coordination of care. She would like to try an increased dose of Wellbutrin- eRx sent for Wellbutrin XL 300 mg daily. Call or return to clinic prn if these symptoms worsen or fail to improve as anticipated. The patient indicates understanding of these issues and agrees with the plan.

## 2014-07-18 NOTE — Patient Instructions (Signed)
It was good to see you. I'm glad you are feeling better. We are increasing your Wellbutrin to 300 mg xl daily.

## 2014-07-18 NOTE — Progress Notes (Signed)
Pre visit review using our clinic review tool, if applicable. No additional management support is needed unless otherwise documented below in the visit note. 

## 2014-07-18 NOTE — Progress Notes (Signed)
Subjective:   Patient ID: Kristie Byrd, female    DOB: 06-Nov-1945, 69 y.o.   MRN: [id number]  Kristie Byrd is a pleasant 69 y.o. year old female who presents to clinic today with Follow-up  on 07/18/2014  HPI: Saw her last month for worsening depression. Note reviewed from 06/19/14- at that time, she felt lexapro was no longer effective.  She felt that her depression was acutely worsening because as she stated-   Cannot find a job-was laid off 3 years ago. Feels "useless." Does not want to get out of bed in the morning. Difficulty concentrating. Her sister is also dying of metastatic CA.  She declined both psychotherapy and psychiatry referral at that time.   Of note, previously could not tolerate prozac, paxil or zoloft but cannot remember why. Also tried celexa- weaned off in 2014.  We therefore weaned off lexapro and started Wellbutrin 150 mg XL daily. She feels the Wellbutrin is actually working well- has a better outlook on finding a job. Less tearful.  Appetite has improved. Denies any adverse reactions.  Sleeping better. Feels that perhaps she would like to try an increase dose as she "still has her days." Wt Readings from Last 3 Encounters:  07/18/14 244 lb (110.678 kg)  06/19/14 242 lb (109.77 kg)  02/08/14 237 lb 8 oz (107.729 kg)     Current Outpatient Prescriptions on File Prior to Visit  Medication Sig Dispense Refill  . Acetaminophen (TYLENOL ARTHRITIS PAIN PO) Take by mouth.    Marland Kitchen aspirin 81 MG tablet Take 81 mg by mouth daily.    Marland Kitchen buPROPion (WELLBUTRIN XL) 150 MG 24 hr tablet Take 1 tablet (150 mg total) by mouth daily. 30 tablet 1  . Cholecalciferol (VITAMIN D3) 2000 UNITS TABS Take one by mouth daily    . cyanocobalamin (,VITAMIN B-12,) 1000 MCG/ML injection Inject 1,000 mcg into the muscle every 30 (thirty) days.    Marland Kitchen escitalopram (LEXAPRO) 20 MG tablet take 1 tablet by mouth once daily 30 tablet 6  . ferrous sulfate 325 (65 FE) MG tablet Take 1 tablet  (325 mg total) by mouth daily with breakfast. 30 tablet 3  . lisinopril-hydrochlorothiazide (PRINZIDE,ZESTORETIC) 10-12.5 MG per tablet take 1 tablet by mouth once daily .PLEASE SCHEDULE LABS FOR ADDITIONAL REFILLS. 90 tablet 0  . meloxicam (MOBIC) 15 MG tablet Take 1 tablet (15 mg total) by mouth daily. 30 tablet 2  . Multiple Vitamin (MULTIVITAMIN) tablet Take 1 tablet by mouth daily.    Marland Kitchen PROAIR HFA 108 (90 BASE) MCG/ACT inhaler inhale 2 puffs by mouth every 6 hours if needed for wheezing or shortness of breath 8.5 g 0  . Tiotropium Bromide Monohydrate (SPIRIVA RESPIMAT) 2.5 MCG/ACT AERS Inhale 2 puffs into the lungs daily. 4 g 3   No current facility-administered medications on file prior to visit.    Allergies  Allergen Reactions  . Codeine Nausea And Vomiting  . Demerol [Meperidine] Nausea And Vomiting  . Fleet Phospho-Soda [Sodium Phosphates]     Vomiting   . Prednisone Other (See Comments)    Chest pressure    Past Medical History  Diagnosis Date  . Hypertension   . Depression   . GERD (gastroesophageal reflux disease)   . Arthritis   . Colon polyps   . Iron deficiency anemia 09/27/2012    Past Surgical History  Procedure Laterality Date  . Tonsillectomy    . Knee surgery Right   . Breast biopsy Bilateral   .  Nose surgery    . Colonoscopy  multiple    History reviewed. No pertinent family history.  History   Social History  . Marital Status: Single    Spouse Name: N/A  . Number of Children: 2  . Years of Education: N/A   Occupational History  . retired    Social History Main Topics  . Smoking status: Former Smoker -- 1.00 packs/day for 37 years    Types: Cigarettes    Quit date: 11/15/1996  . Smokeless tobacco: Never Used  . Alcohol Use: No  . Drug Use: No  . Sexual Activity: Not on file   Other Topics Concern  . Not on file   Social History Narrative   Lives alone.  Divorced. Two daughters- in 2s, 3 grandchildren.   Does not know family  history- raised in [facility].        Desires CPR.  Does not have HPOA.   She would possibly want life support if for short period of time . Unsure about feeding tube.   The PMH, PSH, Social History, Family History, Medications, and allergies have been reviewed in [facility], and have been updated if relevant.   Review of Systems  Constitutional: Negative.   Cardiovascular: Negative.   Psychiatric/Behavioral: Positive for dysphoric mood. Negative for suicidal ideas, hallucinations, behavioral problems, confusion, sleep disturbance, self-injury, decreased concentration and agitation. The patient is nervous/anxious. The patient is not hyperactive.   All other systems reviewed and are negative.      Objective:    BP 118/66 mmHg  Pulse 81  Temp(Src) 97.5 F (36.4 C) (Oral)  Wt 244 lb (110.678 kg)  SpO2 96%   Physical Exam  Constitutional: She is oriented to person, place, and time. She appears well-developed and well-nourished. No distress.  HENT:  Head: Normocephalic.  Eyes: Conjunctivae are normal.  Neck: Neck supple.  Cardiovascular: Normal rate.   Pulmonary/Chest: Effort normal.  Neurological: She is alert and oriented to person, place, and time. No cranial nerve deficit.  Skin: Skin is warm and dry.  Psychiatric:  Not tearful today, seems much happier and more upbeat  Nursing note and vitals reviewed.         Assessment & Plan:   Depression No Follow-up on file.

## 2014-08-06 ENCOUNTER — Ambulatory Visit: Payer: Self-pay | Admitting: Pulmonary Disease

## 2014-09-08 NOTE — Op Note (Signed)
PATIENT NAME:  Kristie Byrd, Kristie Byrd MR#:  [id number] DATE OF BIRTH:  March 09, 1946  DATE OF PROCEDURE:  10/21/2011  PREOPERATIVE DIAGNOSIS: Right knee medial and lateral meniscus tears, osteoarthritis.  POSTOPERATIVE DIAGNOSIS: Right knee medial and lateral meniscus tears, osteoarthritis.   PROCEDURE: Arthroscopy, right knee. Partial medial and lateral meniscectomy.   SURGEON: Laurene Footman, MD  ANESTHESIA:  General.  DESCRIPTION OF PROCEDURE: The patient was brought to the operating room and after adequate general anesthesia was obtained, the right leg was prepped and draped in the usual sterile fashion with a tourniquet applied to the upper thigh along with the arthroscopic legholder. After prepping and draping in the usual sterile manner, appropriate patient identification and time-out procedures were completed. An inferolateral portal was made and the arthroscope was introduced. Initial inspection revealed significant fragments of articular cartilage floating throughout the knee with moderate synovitis present in the suprapatellar pouch. There was advanced degenerative change of the patellofemoral joint. Coming around medially, an inferomedial portal was made and even prior to this it was clear that there was a very complex, very large tear of the posterior horn of the medial meniscus extending to the middle third. Most of the meniscus was in fact torn with loose flaps that could go into the middle of the joint. There were also significant degenerative changes present to the articular cartilage on both femoral and tibial sides with some small areas of bone exposed. Next, going to the notch, the anterior cruciate ligament was intact. In the lateral compartment there was a complex tear of the middle and anterior third of the lateral meniscus. The articular cartilage was relatively normal with just a few areas of chondromalacia. The gutters also had loose articular cartilage present. A shaver was used to  debride the knee to get rid of these loose articular cartilage fragments, not really loose bodies, just many small fragments of smooth cartilage. Coming around medially, the resector  was used to debride most of the meniscus middle and posterior thirds. An ArthroCare wand was then used to smooth the edges of this. The ArthroCare wand was then used to address the lateral meniscus tear, getting back to a stable margin. After having addressed all these problems and thoroughly irrigating the knee, all instrumentation was withdrawn. The wounds were closed with simple interrupted 4-0 nylon. 20 mL of 0.5% Sensorcaine with epinephrine was infiltrated into the portals. Xeroform, 4 x 4's, Webril, and Ace wrap were applied and the patient was sent to the recovery room in stable condition.   ESTIMATED BLOOD LOSS: Minimal.   COMPLICATIONS: None.   SPECIMEN: None. Pre- and postprocedure pictures were obtained during the procedure.     ____________________________ Laurene Footman, MD [id number] D: 10/21/2011 19:25:35 ET T: 10/22/2011 10:22:38 ET JOB#: [id number]  cc: Laurene Footman, MD, <Dictator> Laurene Footman MD ELECTRONICALLY SIGNED 10/22/2011 12:11

## 2014-09-17 ENCOUNTER — Other Ambulatory Visit: Payer: Self-pay | Admitting: Family Medicine

## 2014-09-20 ENCOUNTER — Other Ambulatory Visit: Payer: Self-pay

## 2014-09-20 MED ORDER — TIOTROPIUM BROMIDE MONOHYDRATE 2.5 MCG/ACT IN AERS: 2.0000 | INHALATION_SPRAY | Freq: Every day | RESPIRATORY_TRACT | Status: DC

## 2014-09-23 ENCOUNTER — Encounter: Payer: Self-pay | Admitting: Family Medicine

## 2014-09-23 ENCOUNTER — Other Ambulatory Visit (INDEPENDENT_AMBULATORY_CARE_PROVIDER_SITE_OTHER): Payer: PPO

## 2014-09-23 DIAGNOSIS — D649 Anemia, unspecified: Secondary | ICD-10-CM | POA: Diagnosis not present

## 2014-09-23 LAB — CBC WITH DIFFERENTIAL/PLATELET
Basophils Absolute: 0 10*3/uL (ref 0.0–0.1)
Basophils Relative: 0.8 % (ref 0.0–3.0)
Eosinophils Absolute: 0 10*3/uL (ref 0.0–0.7)
Eosinophils Relative: 0.3 % (ref 0.0–5.0)
HEMATOCRIT: 34.3 % — AB (ref 36.0–46.0)
Hemoglobin: 11.4 g/dL — ABNORMAL LOW (ref 12.0–15.0)
Lymphocytes Relative: 20 % (ref 12.0–46.0)
Lymphs Abs: 1 10*3/uL (ref 0.7–4.0)
MCHC: 33.3 g/dL (ref 30.0–36.0)
MCV: 87.9 fl (ref 78.0–100.0)
MONOS PCT: 9.8 % (ref 3.0–12.0)
Monocytes Absolute: 0.5 10*3/uL (ref 0.1–1.0)
NEUTROS ABS: 3.4 10*3/uL (ref 1.4–7.7)
Neutrophils Relative %: 69.1 % (ref 43.0–77.0)
Platelets: 298 10*3/uL (ref 150.0–400.0)
RBC: 3.9 Mil/uL (ref 3.87–5.11)
RDW: 14.4 % (ref 11.5–15.5)
WBC: 4.9 10*3/uL (ref 4.0–10.5)

## 2014-10-21 ENCOUNTER — Other Ambulatory Visit: Payer: Self-pay | Admitting: Family Medicine

## 2014-11-22 ENCOUNTER — Other Ambulatory Visit: Payer: Self-pay

## 2014-11-22 MED ORDER — CYANOCOBALAMIN 1000 MCG/ML IJ SOLN: 1000.0000 ug | INTRAMUSCULAR | Status: DC

## 2014-11-22 MED ORDER — BUPROPION HCL ER (XL) 300 MG PO TB24: 300.0000 mg | ORAL_TABLET | Freq: Every day | ORAL | Status: DC

## 2014-11-22 NOTE — Telephone Encounter (Signed)
Pt notified B 12 injectable was sent to [facility]. Pt voiced understanding.

## 2014-11-22 NOTE — Telephone Encounter (Signed)
Pt request refill bupropion and V it b 12 injectable to [facility]; pt last seen 07/18/14 and bupropion refilled per protocol; Dr Deborra Medina has never prescribed the B 12 injectable and pt said she does not need that until 11/25/14. Advised pt bupropion sent to pharmacy as requested and pt request cb on Mon when B 12 sent to pharmacy. Pt does not need syringes.

## 2015-01-13 ENCOUNTER — Telehealth: Payer: Self-pay | Admitting: Family Medicine

## 2015-01-13 ENCOUNTER — Other Ambulatory Visit: Payer: Self-pay | Admitting: Family Medicine

## 2015-01-13 NOTE — Telephone Encounter (Signed)
Patient called wanting to know if it is okay for her to just stop the Mobic or does she have to gradually stop it? Patient also wanted to let you know that she requested a refill from the pharmacy on her Albuterol. Patient stated that she has not had to use this for a while and the one she has on hand has expired.

## 2015-01-14 ENCOUNTER — Telehealth: Payer: Self-pay | Admitting: Pulmonary Disease

## 2015-01-14 NOTE — Telephone Encounter (Signed)
Pt called back and notified as instructed. Pt wants to know if should use the spiriva first and if that does not help breathing to use the albuterol. Pt said she cannot get in touch with Dr Lake Bells. Pt request cb.

## 2015-01-14 NOTE — Telephone Encounter (Signed)
Yes that is what I would suggest but she should try to contact Dr. Anastasia Pall office again since he has been managing this.

## 2015-01-14 NOTE — Telephone Encounter (Signed)
Ok to stop mobic and to refill albuterol as requested.

## 2015-01-14 NOTE — Telephone Encounter (Signed)
Called and spoke with pt Pt was confused of how she was suppose to take her albuterol rescue inhaler Pt instructed that rescue inhaler is only suppose to be used as needed for SOB and wheezing Pt voiced understanding of instruction  Nothing further is needed

## 2015-01-14 NOTE — Telephone Encounter (Signed)
Spoke to pt and advised per Dr Aron; pt verbally expressed understanding.  

## 2015-02-19 ENCOUNTER — Other Ambulatory Visit: Payer: Self-pay | Admitting: Family Medicine

## 2015-02-19 NOTE — Telephone Encounter (Signed)
Last Hgb 06/2014

## 2015-03-24 ENCOUNTER — Other Ambulatory Visit: Payer: Self-pay | Admitting: Family Medicine

## 2015-05-13 ENCOUNTER — Other Ambulatory Visit: Payer: Self-pay | Admitting: Internal Medicine

## 2015-05-13 DIAGNOSIS — Z0000 Encounter for general adult medical examination without abnormal findings: Secondary | ICD-10-CM

## 2015-05-14 ENCOUNTER — Other Ambulatory Visit: Payer: Self-pay | Admitting: Family Medicine

## 2015-05-14 DIAGNOSIS — Z1159 Encounter for screening for other viral diseases: Secondary | ICD-10-CM

## 2015-05-14 DIAGNOSIS — D509 Iron deficiency anemia, unspecified: Secondary | ICD-10-CM

## 2015-05-20 ENCOUNTER — Other Ambulatory Visit (INDEPENDENT_AMBULATORY_CARE_PROVIDER_SITE_OTHER): Payer: PPO

## 2015-05-20 ENCOUNTER — Other Ambulatory Visit: Payer: Self-pay | Admitting: Family Medicine

## 2015-05-20 DIAGNOSIS — Z1159 Encounter for screening for other viral diseases: Secondary | ICD-10-CM | POA: Diagnosis not present

## 2015-05-20 DIAGNOSIS — D509 Iron deficiency anemia, unspecified: Secondary | ICD-10-CM | POA: Diagnosis not present

## 2015-05-20 DIAGNOSIS — Z0000 Encounter for general adult medical examination without abnormal findings: Secondary | ICD-10-CM | POA: Diagnosis not present

## 2015-05-20 LAB — VITAMIN D 25 HYDROXY (VIT D DEFICIENCY, FRACTURES): VITD: 42.13 ng/mL (ref 30.00–100.00)

## 2015-05-20 LAB — VITAMIN B12: VITAMIN B 12: 504 pg/mL (ref 211–911)

## 2015-05-20 LAB — COMPREHENSIVE METABOLIC PANEL
ALBUMIN: 3.9 g/dL (ref 3.5–5.2)
ALK PHOS: 93 U/L (ref 39–117)
ALT: 15 U/L (ref 0–35)
AST: 15 U/L (ref 0–37)
BILIRUBIN TOTAL: 0.4 mg/dL (ref 0.2–1.2)
BUN: 22 mg/dL (ref 6–23)
CALCIUM: 9.7 mg/dL (ref 8.4–10.5)
CO2: 27 mEq/L (ref 19–32)
Chloride: 106 mEq/L (ref 96–112)
Creatinine, Ser: 1.2 mg/dL (ref 0.40–1.20)
GFR: 47.4 mL/min — AB (ref >60.00)
Glucose, Bld: 98 mg/dL (ref 70–99)
POTASSIUM: 4 meq/L (ref 3.5–5.1)
Sodium: 140 mEq/L (ref 135–145)
TOTAL PROTEIN: 6.8 g/dL (ref 6.0–8.3)

## 2015-05-20 LAB — CBC WITH DIFFERENTIAL/PLATELET
BASOS ABS: 0 10*3/uL (ref 0.0–0.1)
Basophils Relative: 0.6 % (ref 0.0–3.0)
EOS PCT: 1.1 % (ref 0.0–5.0)
Eosinophils Absolute: 0 10*3/uL (ref 0.0–0.7)
HCT: 35.1 % — ABNORMAL LOW (ref 36.0–46.0)
Hemoglobin: 11.6 g/dL — ABNORMAL LOW (ref 12.0–15.0)
LYMPHS ABS: 1 10*3/uL (ref 0.7–4.0)
Lymphocytes Relative: 21.3 % (ref 12.0–46.0)
MCHC: 32.9 g/dL (ref 30.0–36.0)
MCV: 89 fl (ref 78.0–100.0)
MONOS PCT: 11.1 % (ref 3.0–12.0)
Monocytes Absolute: 0.5 10*3/uL (ref 0.1–1.0)
NEUTROS ABS: 3 10*3/uL (ref 1.4–7.7)
NEUTROS PCT: 65.9 % (ref 43.0–77.0)
PLATELETS: 297 10*3/uL (ref 150.0–400.0)
RBC: 3.94 Mil/uL (ref 3.87–5.11)
RDW: 13.5 % (ref 11.5–15.5)
WBC: 4.5 10*3/uL (ref 4.0–10.5)

## 2015-05-20 LAB — LIPID PANEL
Cholesterol: 172 mg/dL (ref 0–200)
HDL: 47.4 mg/dL (ref >39.00)
LDL Cholesterol: 96 mg/dL (ref 0–99)
NonHDL: 125
TRIGLYCERIDES: 144 mg/dL (ref 0.0–149.0)
Total CHOL/HDL Ratio: 4
VLDL: 28.8 mg/dL (ref 0.0–40.0)

## 2015-05-20 LAB — TSH: TSH: 1.79 u[IU]/mL (ref 0.35–4.50)

## 2015-05-20 LAB — IBC PANEL
IRON: 72 ug/dL (ref 42–145)
Saturation Ratios: 16.2 % — ABNORMAL LOW (ref 20.0–50.0)
TRANSFERRIN: 317 mg/dL (ref 212.0–360.0)

## 2015-05-21 LAB — HEPATITIS C ANTIBODY: HCV AB: NEGATIVE

## 2015-05-27 ENCOUNTER — Ambulatory Visit (INDEPENDENT_AMBULATORY_CARE_PROVIDER_SITE_OTHER): Payer: PPO | Admitting: Family Medicine

## 2015-05-27 ENCOUNTER — Encounter: Payer: Self-pay | Admitting: Family Medicine

## 2015-05-27 VITALS — BP 124/74 | HR 100 | Temp 98.3°F | Ht 63.75 in | Wt 241.2 lb

## 2015-05-27 DIAGNOSIS — Z0000 Encounter for general adult medical examination without abnormal findings: Secondary | ICD-10-CM

## 2015-05-27 DIAGNOSIS — R94120 Abnormal auditory function study: Secondary | ICD-10-CM

## 2015-05-27 DIAGNOSIS — F329 Major depressive disorder, single episode, unspecified: Secondary | ICD-10-CM

## 2015-05-27 DIAGNOSIS — Z01419 Encounter for gynecological examination (general) (routine) without abnormal findings: Secondary | ICD-10-CM | POA: Insufficient documentation

## 2015-05-27 DIAGNOSIS — K219 Gastro-esophageal reflux disease without esophagitis: Secondary | ICD-10-CM | POA: Diagnosis not present

## 2015-05-27 DIAGNOSIS — Z86010 Personal history of colonic polyps: Secondary | ICD-10-CM

## 2015-05-27 DIAGNOSIS — I10 Essential (primary) hypertension: Secondary | ICD-10-CM

## 2015-05-27 DIAGNOSIS — D509 Iron deficiency anemia, unspecified: Secondary | ICD-10-CM

## 2015-05-27 DIAGNOSIS — J449 Chronic obstructive pulmonary disease, unspecified: Secondary | ICD-10-CM

## 2015-05-27 DIAGNOSIS — F32A Depression, unspecified: Secondary | ICD-10-CM

## 2015-05-27 NOTE — Assessment & Plan Note (Signed)
Referral placed to audiology.  

## 2015-05-27 NOTE — Progress Notes (Signed)
Pre visit review using our clinic review tool, if applicable. No additional management support is needed unless otherwise documented below in the visit note. 

## 2015-05-27 NOTE — Assessment & Plan Note (Signed)
Symptoms controlled on current dose of Wellbutrin.

## 2015-05-27 NOTE — Assessment & Plan Note (Signed)
Stable.  No changes made to rxs.

## 2015-05-27 NOTE — Patient Instructions (Addendum)
Great to see you. Happy New Year. Please stop by to see Marion on your way out. 

## 2015-05-27 NOTE — Assessment & Plan Note (Signed)
Well controlled. No changes made to rxs today. 

## 2015-05-27 NOTE — Assessment & Plan Note (Signed)
The patients weight, height, BMI and visual acuity have been recorded in the chart.  Cognitive function assessed.   I have made referrals, counseling and provided education to the patient based review of the above and I have provided the pt with a written personalized care plan for preventive services.  

## 2015-05-27 NOTE — Assessment & Plan Note (Signed)
Continue current dose of iron. Also followed by hematology.

## 2015-05-27 NOTE — Progress Notes (Signed)
Subjective:   Patient ID: Kristie Byrd, female    DOB: 11-14-1945, 70 y.o.   MRN: [id number]  Kristie Byrd is a pleasant 70 y.o. year old female who presents to clinic today with Annual Exam and follow up of chronic medical condiitons on 05/27/2015  HPI:  I have personally reviewed the Medicare Annual Wellness questionnaire and have noted 1. The patient's medical and social history 2. Their use of alcohol, tobacco or illicit drugs 3. Their current medications and supplements 4. The patient's functional ability including ADL's, fall risks, home safety risks and hearing or visual             impairment. 5. Diet and physical activities 6. Evidence for depression or mood disorders  End of life wishes discussed and updated in Social History.  The roster of all physicians providing medical care to patient - is listed in the CareTeams section of the chart.  Influenza vaccine 01/21/15 Pneumovax 02/19/13 prevnar 13 01/23/14 Tdap 02/20/15 Zoster 12/27/12 Colonoscopy 02/06/13 Mammogram 11/14/13  COPD- symptoms have been well controlled with current inhalers.  Depression- Still taking Wellbutrin 300 mg XL daily. Denies any symptoms of anxiety or depression.  HTN- well controlled on current dose of lisinopril- HCTZ.  Lab Results  Component Value Date   CREATININE 1.20 05/20/2015    Anemia- takes iron daily. Lab Results  Component Value Date   WBC 4.5 05/20/2015   HGB 11.6* 05/20/2015   HCT 35.1* 05/20/2015   MCV 89.0 05/20/2015   PLT 297.0 05/20/2015   Lab Results  Component Value Date   CHOL 172 05/20/2015   HDL 47.40 05/20/2015   LDLCALC 96 05/20/2015   TRIG 144.0 05/20/2015   CHOLHDL 4 05/20/2015   Lab Results  Component Value Date   NA 140 05/20/2015   K 4.0 05/20/2015   CL 106 05/20/2015   CO2 27 05/20/2015   Lab Results  Component Value Date   ALT 15 05/20/2015   AST 15 05/20/2015   ALKPHOS 93 05/20/2015   BILITOT 0.4 05/20/2015   Lab Results  Component  Value Date   CREATININE 1.20 05/20/2015     Wt Readings from Last 3 Encounters:  05/27/15 241 lb 4 oz (109.43 kg)  07/18/14 244 lb (110.678 kg)  06/19/14 242 lb (109.77 kg)     Current Outpatient Prescriptions on File Prior to Visit  Medication Sig Dispense Refill  . Acetaminophen (TYLENOL ARTHRITIS PAIN PO) Take by mouth daily as needed.     Marland Kitchen aspirin 81 MG tablet Take 81 mg by mouth daily.    Marland Kitchen buPROPion (WELLBUTRIN XL) 300 MG 24 hr tablet take 1 tablet by mouth once daily 30 tablet 3  . Cholecalciferol (VITAMIN D3) 2000 UNITS TABS Take one by mouth daily    . cyanocobalamin (,VITAMIN B-12,) 1000 MCG/ML injection INJECT 1ML INTO THE MUSCLE EVERY 30 DAYS 10 mL 0  . ferrous sulfate 325 (65 FE) MG tablet take 1 tablet by mouth once daily WITH BREAKFAST 30 tablet 3  . lisinopril-hydrochlorothiazide (PRINZIDE,ZESTORETIC) 10-12.5 MG per tablet take 1 tablet by mouth once daily 90 tablet 3  . Multiple Vitamin (MULTIVITAMIN) tablet Take 1 tablet by mouth daily.    . Tiotropium Bromide Monohydrate (SPIRIVA RESPIMAT) 2.5 MCG/ACT AERS Inhale 2 puffs into the lungs daily. 4 g 2  . PROAIR HFA 108 (90 BASE) MCG/ACT inhaler inhale 2 puffs by mouth every 6 hours if needed for wheezing or shortness of breath (Patient not taking: Reported on  05/27/2015) 8.5 Inhaler 5   No current facility-administered medications on file prior to visit.    Allergies  Allergen Reactions  . Codeine Nausea And Vomiting  . Demerol [Meperidine] Nausea And Vomiting  . Fleet Phospho-Soda [Sodium Phosphates]     Vomiting   . Prednisone Other (See Comments)    Chest pressure    Past Medical History  Diagnosis Date  . Hypertension   . Depression   . GERD (gastroesophageal reflux disease)   . Arthritis   . Colon polyps   . Iron deficiency anemia 09/27/2012    Past Surgical History  Procedure Laterality Date  . Tonsillectomy    . Knee surgery Right   . Breast biopsy Bilateral   . Nose surgery    .  Colonoscopy  multiple    No family history on file.  Social History   Social History  . Marital Status: Single    Spouse Name: N/A  . Number of Children: 2  . Years of Education: N/A   Occupational History  . retired    Social History Main Topics  . Smoking status: Former Smoker -- 1.00 packs/day for 37 years    Types: Cigarettes    Quit date: 11/15/1996  . Smokeless tobacco: Never Used  . Alcohol Use: No  . Drug Use: No  . Sexual Activity: Not on file   Other Topics Concern  . Not on file   Social History Narrative   Lives alone.  Divorced. Two daughters- in 71s, 3 grandchildren.   Does not know family history- raised in [facility].        Desires CPR.  Does not have HPOA.   She would possibly want life support if for short period of time . Unsure about feeding tube.   The PMH, PSH, Social History, Family History, Medications, and allergies have been reviewed in [facility], and have been updated if relevant.   Review of Systems  Constitutional: Negative.   HENT: Negative.   Eyes: Negative.   Respiratory: Negative.   Cardiovascular: Negative.   Endocrine: Negative.   Genitourinary: Negative.   Musculoskeletal: Negative.   Allergic/Immunologic: Negative.   Neurological: Negative.   Hematological: Negative.   Psychiatric/Behavioral: Negative.  Negative for suicidal ideas, hallucinations, behavioral problems, confusion, sleep disturbance, self-injury, dysphoric mood, decreased concentration and agitation. The patient is not nervous/anxious and is not hyperactive.   All other systems reviewed and are negative.      Objective:    BP 124/74 mmHg  Pulse 100  Temp(Src) 98.3 F (36.8 C) (Oral)  Ht 5' 3.75" (1.619 m)  Wt 241 lb 4 oz (109.43 kg)  BMI 41.75 kg/m2   Physical Exam  Constitutional: She is oriented to person, place, and time. She appears well-developed and well-nourished. No distress.  HENT:  Head: Normocephalic.  Eyes: Conjunctivae are  normal.  Neck: Neck supple.  Cardiovascular: Normal rate.   Pulmonary/Chest: Effort normal.  Neurological: She is alert and oriented to person, place, and time. No cranial nerve deficit.  Skin: Skin is warm and dry.  Psychiatric:  Not tearful today, seems much happier and more upbeat  Nursing note and vitals reviewed.         Assessment & Plan:   Chronic obstructive pulmonary disease, unspecified COPD type ([place])  Personal history of colonic adenoma  Iron deficiency anemia  Gastroesophageal reflux disease without esophagitis  Depression  Essential hypertension  Medicare annual wellness visit, subsequent  Failed hearing screening - Plan: Ambulatory referral  to Audiology No Follow-up on file.

## 2015-06-03 ENCOUNTER — Other Ambulatory Visit: Payer: Self-pay

## 2015-06-03 ENCOUNTER — Telehealth: Payer: Self-pay | Admitting: Pulmonary Disease

## 2015-06-03 MED ORDER — TIOTROPIUM BROMIDE MONOHYDRATE 2.5 MCG/ACT IN AERS: 2.0000 | INHALATION_SPRAY | Freq: Every day | RESPIRATORY_TRACT | Status: DC

## 2015-06-03 NOTE — Telephone Encounter (Signed)
Pt request refill spiriva to [facility]. Pt does not have money to see Dr [place] Bells now and pt had annual exam on 05/27/15.Please advise.

## 2015-06-03 NOTE — Telephone Encounter (Signed)
Spoke with pt, states she needs a refill on spiriva.  I advised that she has not been seen since 11/2013 and would need to schedule an appt to continue getting meds refilled through our office. Pt declined, saying she cannot afford to see a doctor at this time.  I advised pt to check with PCP to see if they could refill med since she has not been seen here and refused to schedule an appt.  Pt expressed understanding.  Nothing further needed.

## 2015-06-13 DIAGNOSIS — H2513 Age-related nuclear cataract, bilateral: Secondary | ICD-10-CM | POA: Diagnosis not present

## 2015-06-20 DIAGNOSIS — H903 Sensorineural hearing loss, bilateral: Secondary | ICD-10-CM | POA: Diagnosis not present

## 2015-06-20 DIAGNOSIS — H9201 Otalgia, right ear: Secondary | ICD-10-CM | POA: Diagnosis not present

## 2015-06-30 ENCOUNTER — Other Ambulatory Visit: Payer: Self-pay | Admitting: Family Medicine

## 2015-07-16 ENCOUNTER — Encounter: Payer: Self-pay | Admitting: Family Medicine

## 2015-08-03 ENCOUNTER — Other Ambulatory Visit: Payer: Self-pay | Admitting: Family Medicine

## 2015-08-30 ENCOUNTER — Other Ambulatory Visit: Payer: Self-pay | Admitting: Family Medicine

## 2015-09-01 NOTE — Telephone Encounter (Signed)
Last office visit 05/27/2015.  Meloxicam not on current medication list.  Refill?

## 2015-10-13 ENCOUNTER — Other Ambulatory Visit: Payer: Self-pay | Admitting: Family Medicine

## 2015-12-12 ENCOUNTER — Other Ambulatory Visit: Payer: Self-pay | Admitting: Family Medicine

## 2016-03-25 DIAGNOSIS — M1711 Unilateral primary osteoarthritis, right knee: Secondary | ICD-10-CM | POA: Diagnosis not present

## 2016-03-30 DIAGNOSIS — M1711 Unilateral primary osteoarthritis, right knee: Secondary | ICD-10-CM | POA: Insufficient documentation

## 2016-04-08 ENCOUNTER — Other Ambulatory Visit: Payer: Self-pay | Admitting: Family Medicine

## 2016-04-12 NOTE — Telephone Encounter (Signed)
Last labs normal 05/2015. pls advise

## 2016-04-28 ENCOUNTER — Telehealth: Payer: Self-pay

## 2016-04-28 ENCOUNTER — Encounter
Admission: RE | Admit: 2016-04-28 | Discharge: 2016-04-28 | Disposition: A | Discharge status: Home / Self Care | Payer: PPO | Source: Ambulatory Visit | Attending: Orthopedic Surgery | Admitting: Orthopedic Surgery

## 2016-04-28 DIAGNOSIS — Z01812 Encounter for preprocedural laboratory examination: Secondary | ICD-10-CM | POA: Diagnosis not present

## 2016-04-28 DIAGNOSIS — Z01810 Encounter for preprocedural cardiovascular examination: Secondary | ICD-10-CM | POA: Diagnosis not present

## 2016-04-28 HISTORY — DX: Malignant (primary) neoplasm, unspecified: C80.1

## 2016-04-28 HISTORY — DX: Headache: R51

## 2016-04-28 HISTORY — DX: Zoster without complications: B02.9

## 2016-04-28 HISTORY — DX: Headache, unspecified: R51.9

## 2016-04-28 HISTORY — DX: Chronic obstructive pulmonary disease, unspecified: J44.9

## 2016-04-28 HISTORY — DX: Nausea with vomiting, unspecified: R11.2

## 2016-04-28 HISTORY — DX: Other specified postprocedural states: Z98.890

## 2016-04-28 HISTORY — DX: Other specified postprocedural states: R11.2

## 2016-04-28 LAB — CBC
HEMATOCRIT: 31.7 % — AB (ref 35.0–47.0)
HEMOGLOBIN: 10.7 g/dL — AB (ref 12.0–16.0)
MCH: 30.1 pg (ref 26.0–34.0)
MCHC: 33.8 g/dL (ref 32.0–36.0)
MCV: 88.9 fL (ref 80.0–100.0)
Platelets: 283 10*3/uL (ref 150–440)
RBC: 3.56 MIL/uL — ABNORMAL LOW (ref 3.80–5.20)
RDW: 14.9 % — AB (ref 11.5–14.5)
WBC: 4.7 10*3/uL (ref 3.6–11.0)

## 2016-04-28 LAB — SEDIMENTATION RATE: Sed Rate: 50 mm/hr — ABNORMAL HIGH (ref 0–30)

## 2016-04-28 LAB — COMPREHENSIVE METABOLIC PANEL
ALT: 17 U/L (ref 14–54)
AST: 21 U/L (ref 15–41)
Albumin: 4 g/dL (ref 3.5–5.0)
Alkaline Phosphatase: 82 U/L (ref 38–126)
Anion gap: 5 (ref 5–15)
BUN: 23 mg/dL — AB (ref 6–20)
CHLORIDE: 108 mmol/L (ref 101–111)
CO2: 27 mmol/L (ref 22–32)
CREATININE: 1.16 mg/dL — AB (ref 0.44–1.00)
Calcium: 9.6 mg/dL (ref 8.9–10.3)
GFR calc Af Amer: 54 mL/min — ABNORMAL LOW (ref >60)
GFR calc non Af Amer: 47 mL/min — ABNORMAL LOW (ref >60)
GLUCOSE: 92 mg/dL (ref 65–99)
POTASSIUM: 4.1 mmol/L (ref 3.5–5.1)
Sodium: 140 mmol/L (ref 135–145)
Total Bilirubin: 0.7 mg/dL (ref 0.3–1.2)
Total Protein: 7.1 g/dL (ref 6.5–8.1)

## 2016-04-28 LAB — APTT: APTT: 27 s (ref 24–36)

## 2016-04-28 LAB — URINALYSIS, COMPLETE (UACMP) WITH MICROSCOPIC
BACTERIA UA: NONE SEEN
Bilirubin Urine: NEGATIVE
GLUCOSE, UA: NEGATIVE mg/dL
Hgb urine dipstick: NEGATIVE
KETONES UR: NEGATIVE mg/dL
Nitrite: NEGATIVE
PROTEIN: NEGATIVE mg/dL
Specific Gravity, Urine: 1.01 (ref 1.005–1.030)
pH: 5 (ref 5.0–8.0)

## 2016-04-28 LAB — TYPE AND SCREEN
ABO/RH(D): AB POS
ANTIBODY SCREEN: NEGATIVE

## 2016-04-28 LAB — SURGICAL PCR SCREEN
MRSA, PCR: NEGATIVE
STAPHYLOCOCCUS AUREUS: NEGATIVE

## 2016-04-28 LAB — PROTIME-INR
INR: 0.95
Prothrombin Time: 12.7 seconds (ref 11.4–15.2)

## 2016-04-28 NOTE — Pre-Procedure Instructions (Signed)
PATIENT VERY ANXIOUS ABOUT ANESTHESIA AND SURGERY WOULD LIKE SOMETHING TO HELP HER RELAX

## 2016-04-28 NOTE — Telephone Encounter (Signed)
Could you please make an appt for her to see me?

## 2016-04-28 NOTE — Telephone Encounter (Signed)
Spoke to pt who states she is not wanting to schedule an appt. She "wont worry about it."

## 2016-04-28 NOTE — Telephone Encounter (Signed)
Pt is scheduled for total knee replacement on 05/12/16; pt is overwhelmed and nervous about upcoming surgery. The surgeon suggest contacting PCP to get med to help pt be more relaxed and less anxious. Pt request short term med to [facility]. No SI/HI. Pt last annual 05/27/15. Pt request cb.

## 2016-04-28 NOTE — Patient Instructions (Signed)
Your procedure is scheduled on: Wednesday 05/12/16 Report to [facility]. 2ND FLOOR MEDICAL MALL ENTRANCE. To find out your arrival time please call [phone] between 1PM - 3PM on Tuesday 05/11/16.  Remember: Instructions that are not followed completely may result in serious medical risk, up to and including death, or upon the discretion of your surgeon and anesthesiologist your surgery may need to be rescheduled.    __X__ 1. Do not eat food or drink liquids after midnight. No gum chewing or hard candies.     __X__ 2. No Alcohol for 24 hours before or after surgery.   ____ 3. Bring all medications with you on the day of surgery if instructed.    __X__ 4. Notify your doctor if there is any change in your medical condition     (cold, fever, infections).             ___X__5. No smoking within 24 hours of your surgery.     Do not wear jewelry, make-up, hairpins, clips or nail polish.  Do not wear lotions, powders, or perfumes.   Do not shave 48 hours prior to surgery. Men may shave face and neck.  Do not bring valuables to the hospital.    [facility] is not responsible for any belongings or valuables.               Contacts, dentures or bridgework may not be worn into surgery.  Leave your suitcase in the car. After surgery it may be brought to your room.  For patients admitted to the hospital, discharge time is determined by your                treatment team.   Patients discharged the day of surgery will not be allowed to drive home.   Please read over the following fact sheets that you were given:   Pain Booklet and MRSA Information   __X__ Take these medicines the morning of surgery with A SIP OF WATER:    1. NONE  2.   3.   4.  5.  6.  ____ Fleet Enema (as directed)   __X__ Use CHG Soap as directed  ___X_ Use inhalers on the day of surgery  ____ Stop metformin 2 days prior to surgery    ____ Take 1/2 of usual insulin dose the night before surgery and none on the  morning of surgery.   __X__ Stop Coumadin/Plavix/aspirin on 05/02/16  __X__ Stop Anti-inflammatories such as Advil, Aleve, Ibuprofen, Motrin, Naproxen, Naprosyn, Goodies,powder, or aspirin products STOP MELOXICAM.  OK to take Tylenol.   ____ Stop supplements until after surgery.    ____ Bring C-Pap to the hospital.

## 2016-04-29 LAB — URINE CULTURE

## 2016-05-05 ENCOUNTER — Encounter: Payer: Self-pay | Admitting: Family Medicine

## 2016-05-05 LAB — LATEX, IGE

## 2016-05-05 NOTE — Pre-Procedure Instructions (Signed)
Per Margart Sickles at [facility] do not need to repeat urine culture.

## 2016-05-11 MED ORDER — CEFAZOLIN SODIUM-DEXTROSE 2-4 GM/100ML-% IV SOLN
2.0000 g | Freq: Once | INTRAVENOUS | Status: AC
  Administered 2016-05-12: 2 g via INTRAVENOUS

## 2016-05-12 ENCOUNTER — Encounter
Admission: RE | Disposition: A | Discharge status: Skilled Nursing Facility | Payer: Self-pay | Source: Ambulatory Visit | Attending: Orthopedic Surgery

## 2016-05-12 ENCOUNTER — Inpatient Hospital Stay
Admission: RE | Admit: 2016-05-12 | Discharge: 2016-05-14 | DRG: 470 | Disposition: A | Discharge status: Skilled Nursing Facility | Payer: PPO | Source: Ambulatory Visit | Attending: Orthopedic Surgery | Admitting: Orthopedic Surgery

## 2016-05-12 ENCOUNTER — Inpatient Hospital Stay: Payer: PPO

## 2016-05-12 ENCOUNTER — Encounter: Payer: Self-pay | Admitting: Orthopedic Surgery

## 2016-05-12 ENCOUNTER — Inpatient Hospital Stay: Payer: PPO | Admitting: Certified Registered"

## 2016-05-12 DIAGNOSIS — I10 Essential (primary) hypertension: Secondary | ICD-10-CM | POA: Diagnosis not present

## 2016-05-12 DIAGNOSIS — D509 Iron deficiency anemia, unspecified: Secondary | ICD-10-CM | POA: Diagnosis not present

## 2016-05-12 DIAGNOSIS — Z7401 Bed confinement status: Secondary | ICD-10-CM | POA: Diagnosis not present

## 2016-05-12 DIAGNOSIS — M1711 Unilateral primary osteoarthritis, right knee: Principal | ICD-10-CM | POA: Diagnosis present

## 2016-05-12 DIAGNOSIS — K219 Gastro-esophageal reflux disease without esophagitis: Secondary | ICD-10-CM | POA: Diagnosis present

## 2016-05-12 DIAGNOSIS — Z87891 Personal history of nicotine dependence: Secondary | ICD-10-CM

## 2016-05-12 DIAGNOSIS — J449 Chronic obstructive pulmonary disease, unspecified: Secondary | ICD-10-CM | POA: Diagnosis not present

## 2016-05-12 DIAGNOSIS — D62 Acute posthemorrhagic anemia: Secondary | ICD-10-CM | POA: Diagnosis not present

## 2016-05-12 DIAGNOSIS — M6281 Muscle weakness (generalized): Secondary | ICD-10-CM

## 2016-05-12 DIAGNOSIS — Z85828 Personal history of other malignant neoplasm of skin: Secondary | ICD-10-CM | POA: Diagnosis not present

## 2016-05-12 DIAGNOSIS — Z86010 Personal history of colonic polyps: Secondary | ICD-10-CM | POA: Diagnosis not present

## 2016-05-12 DIAGNOSIS — Z79891 Long term (current) use of opiate analgesic: Secondary | ICD-10-CM | POA: Diagnosis not present

## 2016-05-12 DIAGNOSIS — Z96651 Presence of right artificial knee joint: Secondary | ICD-10-CM | POA: Diagnosis not present

## 2016-05-12 DIAGNOSIS — Z6841 Body Mass Index (BMI) 40.0 and over, adult: Secondary | ICD-10-CM | POA: Diagnosis not present

## 2016-05-12 DIAGNOSIS — R2681 Unsteadiness on feet: Secondary | ICD-10-CM | POA: Diagnosis not present

## 2016-05-12 DIAGNOSIS — R269 Unspecified abnormalities of gait and mobility: Secondary | ICD-10-CM | POA: Diagnosis not present

## 2016-05-12 DIAGNOSIS — R6889 Other general symptoms and signs: Secondary | ICD-10-CM | POA: Diagnosis not present

## 2016-05-12 DIAGNOSIS — R262 Difficulty in walking, not elsewhere classified: Secondary | ICD-10-CM

## 2016-05-12 DIAGNOSIS — M1990 Unspecified osteoarthritis, unspecified site: Secondary | ICD-10-CM | POA: Diagnosis not present

## 2016-05-12 DIAGNOSIS — Z96659 Presence of unspecified artificial knee joint: Secondary | ICD-10-CM

## 2016-05-12 DIAGNOSIS — M545 Low back pain: Secondary | ICD-10-CM | POA: Diagnosis present

## 2016-05-12 DIAGNOSIS — E669 Obesity, unspecified: Secondary | ICD-10-CM | POA: Diagnosis not present

## 2016-05-12 DIAGNOSIS — F329 Major depressive disorder, single episode, unspecified: Secondary | ICD-10-CM | POA: Diagnosis present

## 2016-05-12 DIAGNOSIS — Z471 Aftercare following joint replacement surgery: Secondary | ICD-10-CM | POA: Diagnosis not present

## 2016-05-12 HISTORY — PX: KNEE ARTHROPLASTY: SHX992

## 2016-05-12 LAB — ABO/RH: ABO/RH(D): AB POS

## 2016-05-12 SURGERY — ARTHROPLASTY, KNEE, TOTAL, USING IMAGELESS COMPUTER-ASSISTED NAVIGATION
Anesthesia: Spinal | Site: Knee | Laterality: Right | Wound class: Clean

## 2016-05-12 MED ORDER — ONDANSETRON HCL 4 MG PO TABS: 4.0000 mg | ORAL_TABLET | Freq: Four times a day (QID) | ORAL | Status: DC | PRN

## 2016-05-12 MED ORDER — MENTHOL 3 MG MT LOZG
1.0000 | LOZENGE | OROMUCOSAL | Status: DC | PRN
  Filled 2016-05-12: qty 9

## 2016-05-12 MED ORDER — TRANEXAMIC ACID 1000 MG/10ML IV SOLN
INTRAVENOUS | Status: DC | PRN
  Administered 2016-05-12: 1000 mg via INTRAVENOUS

## 2016-05-12 MED ORDER — BUPIVACAINE HCL (PF) 0.5 % IJ SOLN
INTRAMUSCULAR | Status: DC | PRN
Start: 2016-05-12 — End: 2016-05-12
  Administered 2016-05-12: 2 mL via INTRATHECAL

## 2016-05-12 MED ORDER — GLYCOPYRROLATE 0.2 MG/ML IJ SOLN
INTRAMUSCULAR | Status: AC
  Filled 2016-05-12: qty 1

## 2016-05-12 MED ORDER — VITAMIN D 1000 UNITS PO TABS
1000.0000 [IU] | ORAL_TABLET | Freq: Every day | ORAL | Status: DC
  Administered 2016-05-13 – 2016-05-14 (×2): 1000 [IU] via ORAL
  Filled 2016-05-12 (×2): qty 1

## 2016-05-12 MED ORDER — POLYVINYL ALCOHOL 1.4 % OP SOLN
1.0000 [drp] | OPHTHALMIC | Status: DC | PRN
  Filled 2016-05-12: qty 15

## 2016-05-12 MED ORDER — PHENYLEPHRINE HCL 10 MG/ML IJ SOLN
INTRAMUSCULAR | Status: DC | PRN
  Administered 2016-05-12 (×3): 100 ug via INTRAVENOUS

## 2016-05-12 MED ORDER — MAGNESIUM HYDROXIDE 400 MG/5ML PO SUSP
30.0000 mL | Freq: Every day | ORAL | Status: DC | PRN
  Administered 2016-05-13 (×2): 30 mL via ORAL
  Filled 2016-05-12: qty 30

## 2016-05-12 MED ORDER — PROMETHAZINE HCL 25 MG/ML IJ SOLN
6.2500 mg | INTRAMUSCULAR | Status: DC | PRN
  Administered 2016-05-12: 6.25 mg via INTRAVENOUS

## 2016-05-12 MED ORDER — TIOTROPIUM BROMIDE MONOHYDRATE 18 MCG IN CAPS
1.0000 | ORAL_CAPSULE | Freq: Every day | RESPIRATORY_TRACT | Status: DC
  Administered 2016-05-12: 18 ug via RESPIRATORY_TRACT
  Filled 2016-05-12: qty 5

## 2016-05-12 MED ORDER — ALUM & MAG HYDROXIDE-SIMETH 200-200-20 MG/5ML PO SUSP: 30.0000 mL | ORAL | Status: DC | PRN

## 2016-05-12 MED ORDER — PHENOL 1.4 % MT LIQD
1.0000 | OROMUCOSAL | Status: DC | PRN
  Filled 2016-05-12: qty 177

## 2016-05-12 MED ORDER — METAXALONE 800 MG PO TABS
800.0000 mg | ORAL_TABLET | Freq: Three times a day (TID) | ORAL | Status: DC | PRN
  Administered 2016-05-12: 800 mg via ORAL
  Filled 2016-05-12 (×2): qty 1

## 2016-05-12 MED ORDER — SODIUM CHLORIDE 0.9 % IV SOLN
INTRAVENOUS | Status: DC
  Administered 2016-05-12: 18:00:00 via INTRAVENOUS

## 2016-05-12 MED ORDER — ACETAMINOPHEN 10 MG/ML IV SOLN
INTRAVENOUS | Status: DC | PRN
  Administered 2016-05-12: 1000 mg via INTRAVENOUS

## 2016-05-12 MED ORDER — FAMOTIDINE 20 MG PO TABS
20.0000 mg | ORAL_TABLET | Freq: Once | ORAL | Status: AC
  Administered 2016-05-12: 20 mg via ORAL

## 2016-05-12 MED ORDER — BISACODYL 10 MG RE SUPP: 10.0000 mg | Freq: Every day | RECTAL | Status: DC | PRN

## 2016-05-12 MED ORDER — CELECOXIB 200 MG PO CAPS
200.0000 mg | ORAL_CAPSULE | Freq: Two times a day (BID) | ORAL | Status: DC
  Administered 2016-05-12 – 2016-05-14 (×4): 200 mg via ORAL
  Filled 2016-05-12 (×4): qty 1

## 2016-05-12 MED ORDER — ALBUTEROL SULFATE (2.5 MG/3ML) 0.083% IN NEBU: 2.5000 mg | INHALATION_SOLUTION | Freq: Four times a day (QID) | RESPIRATORY_TRACT | Status: DC | PRN

## 2016-05-12 MED ORDER — BUPIVACAINE HCL (PF) 0.25 % IJ SOLN
INTRAMUSCULAR | Status: DC | PRN
  Administered 2016-05-12: 60 mL

## 2016-05-12 MED ORDER — ACETAMINOPHEN 325 MG PO TABS: 650.0000 mg | ORAL_TABLET | Freq: Four times a day (QID) | ORAL | Status: DC | PRN

## 2016-05-12 MED ORDER — ADULT MULTIVITAMIN W/MINERALS CH
1.0000 | ORAL_TABLET | Freq: Every day | ORAL | Status: DC
  Administered 2016-05-12 – 2016-05-13 (×2): 1 via ORAL
  Filled 2016-05-12 (×2): qty 1

## 2016-05-12 MED ORDER — CEFAZOLIN SODIUM-DEXTROSE 2-4 GM/100ML-% IV SOLN
INTRAVENOUS | Status: AC
  Filled 2016-05-12: qty 100

## 2016-05-12 MED ORDER — ACETAMINOPHEN 10 MG/ML IV SOLN
1000.0000 mg | Freq: Four times a day (QID) | INTRAVENOUS | Status: AC
  Administered 2016-05-12 – 2016-05-13 (×4): 1000 mg via INTRAVENOUS
  Filled 2016-05-12 (×4): qty 100

## 2016-05-12 MED ORDER — HYDROCHLOROTHIAZIDE 12.5 MG PO CAPS
12.5000 mg | ORAL_CAPSULE | Freq: Every day | ORAL | Status: DC
  Administered 2016-05-12 – 2016-05-14 (×3): 12.5 mg via ORAL
  Filled 2016-05-12 (×3): qty 1

## 2016-05-12 MED ORDER — TETRACAINE HCL 1 % IJ SOLN
INTRAMUSCULAR | Status: DC | PRN
Start: 2016-05-12 — End: 2016-05-12
  Administered 2016-05-12: 10 mg via INTRASPINAL

## 2016-05-12 MED ORDER — MORPHINE SULFATE (PF) 2 MG/ML IV SOLN
2.0000 mg | INTRAVENOUS | Status: DC | PRN
  Administered 2016-05-12: 2 mg via INTRAVENOUS
  Filled 2016-05-12: qty 1

## 2016-05-12 MED ORDER — GLYCOPYRROLATE 0.2 MG/ML IJ SOLN
INTRAMUSCULAR | Status: DC | PRN
  Administered 2016-05-12: 0.2 mg via INTRAVENOUS

## 2016-05-12 MED ORDER — METOCLOPRAMIDE HCL 10 MG PO TABS
10.0000 mg | ORAL_TABLET | Freq: Three times a day (TID) | ORAL | Status: AC
  Administered 2016-05-12 – 2016-05-14 (×7): 10 mg via ORAL
  Filled 2016-05-12 (×7): qty 1

## 2016-05-12 MED ORDER — DIPHENHYDRAMINE HCL 12.5 MG/5ML PO ELIX
12.5000 mg | ORAL_SOLUTION | ORAL | Status: DC | PRN
  Administered 2016-05-12: 12.5 mg via ORAL
  Administered 2016-05-13: 25 mg via ORAL
  Filled 2016-05-12: qty 10
  Filled 2016-05-12: qty 5
  Filled 2016-05-12: qty 10

## 2016-05-12 MED ORDER — OXYCODONE HCL 5 MG PO TABS
5.0000 mg | ORAL_TABLET | ORAL | Status: DC | PRN
  Administered 2016-05-12: 5 mg via ORAL
  Administered 2016-05-13 (×3): 10 mg via ORAL
  Administered 2016-05-14 (×2): 5 mg via ORAL
  Filled 2016-05-12: qty 2
  Filled 2016-05-12 (×2): qty 1
  Filled 2016-05-12: qty 2
  Filled 2016-05-12: qty 1
  Filled 2016-05-12: qty 2

## 2016-05-12 MED ORDER — CARBOXYMETHYLCELLULOSE SODIUM 0.25 % OP SOLN: 1.0000 [drp] | Freq: Every day | OPHTHALMIC | Status: DC | PRN

## 2016-05-12 MED ORDER — ENOXAPARIN SODIUM 30 MG/0.3ML ~~LOC~~ SOLN
30.0000 mg | Freq: Two times a day (BID) | SUBCUTANEOUS | Status: DC
  Administered 2016-05-13 – 2016-05-14 (×3): 30 mg via SUBCUTANEOUS
  Filled 2016-05-12 (×3): qty 0.3

## 2016-05-12 MED ORDER — PANTOPRAZOLE SODIUM 40 MG PO TBEC
40.0000 mg | DELAYED_RELEASE_TABLET | Freq: Two times a day (BID) | ORAL | Status: DC
  Administered 2016-05-12 – 2016-05-14 (×4): 40 mg via ORAL
  Filled 2016-05-12 (×4): qty 1

## 2016-05-12 MED ORDER — OXYCODONE HCL 5 MG/5ML PO SOLN: 5.0000 mg | Freq: Once | ORAL | Status: DC | PRN

## 2016-05-12 MED ORDER — PROPOFOL 10 MG/ML IV BOLUS
INTRAVENOUS | Status: AC
  Filled 2016-05-12: qty 20

## 2016-05-12 MED ORDER — MIDAZOLAM HCL 2 MG/2ML IJ SOLN
INTRAMUSCULAR | Status: AC
  Filled 2016-05-12: qty 2

## 2016-05-12 MED ORDER — PROPOFOL 500 MG/50ML IV EMUL
INTRAVENOUS | Status: DC | PRN
  Administered 2016-05-12: 35 ug/kg/min via INTRAVENOUS

## 2016-05-12 MED ORDER — ONDANSETRON HCL 4 MG/2ML IJ SOLN: 4.0000 mg | Freq: Four times a day (QID) | INTRAMUSCULAR | Status: DC | PRN

## 2016-05-12 MED ORDER — LISINOPRIL-HYDROCHLOROTHIAZIDE 10-12.5 MG PO TABS: 1.0000 | ORAL_TABLET | Freq: Every day | ORAL | Status: DC

## 2016-05-12 MED ORDER — LISINOPRIL 10 MG PO TABS
10.0000 mg | ORAL_TABLET | Freq: Every day | ORAL | Status: DC
  Administered 2016-05-12 – 2016-05-14 (×3): 10 mg via ORAL
  Filled 2016-05-12 (×3): qty 1

## 2016-05-12 MED ORDER — TRANEXAMIC ACID 1000 MG/10ML IV SOLN
INTRAVENOUS | Status: AC
  Filled 2016-05-12: qty 10

## 2016-05-12 MED ORDER — LACTATED RINGERS IV SOLN
INTRAVENOUS | Status: DC
  Administered 2016-05-12 (×3): via INTRAVENOUS

## 2016-05-12 MED ORDER — FENTANYL CITRATE (PF) 100 MCG/2ML IJ SOLN
INTRAMUSCULAR | Status: DC | PRN
  Administered 2016-05-12 (×2): 25 ug via INTRAVENOUS
  Administered 2016-05-12: 50 ug via INTRAVENOUS

## 2016-05-12 MED ORDER — FLEET ENEMA 7-19 GM/118ML RE ENEM: 1.0000 | ENEMA | Freq: Once | RECTAL | Status: DC | PRN

## 2016-05-12 MED ORDER — ACETAMINOPHEN 10 MG/ML IV SOLN
INTRAVENOUS | Status: AC
  Filled 2016-05-12: qty 100

## 2016-05-12 MED ORDER — FERROUS SULFATE 325 (65 FE) MG PO TABS
325.0000 mg | ORAL_TABLET | Freq: Two times a day (BID) | ORAL | Status: DC
  Administered 2016-05-13 – 2016-05-14 (×3): 325 mg via ORAL
  Filled 2016-05-12 (×3): qty 1

## 2016-05-12 MED ORDER — LIDOCAINE HCL (PF) 2 % IJ SOLN
INTRAMUSCULAR | Status: DC | PRN
  Administered 2016-05-12: 50 mg

## 2016-05-12 MED ORDER — NEOMYCIN-POLYMYXIN B GU 40-200000 IR SOLN
Status: DC | PRN
  Administered 2016-05-12: 14 mL

## 2016-05-12 MED ORDER — OXYCODONE HCL 5 MG PO TABS: 5.0000 mg | ORAL_TABLET | Freq: Once | ORAL | Status: DC | PRN

## 2016-05-12 MED ORDER — SODIUM CHLORIDE 0.9 % IV SOLN
INTRAVENOUS | Status: DC | PRN
  Administered 2016-05-12: 30 ug/min via INTRAVENOUS

## 2016-05-12 MED ORDER — SODIUM CHLORIDE 0.9 % IJ SOLN
INTRAMUSCULAR | Status: AC
  Filled 2016-05-12: qty 10

## 2016-05-12 MED ORDER — ACETAMINOPHEN 650 MG RE SUPP: 650.0000 mg | Freq: Four times a day (QID) | RECTAL | Status: DC | PRN

## 2016-05-12 MED ORDER — MIDAZOLAM HCL 5 MG/5ML IJ SOLN
INTRAMUSCULAR | Status: DC | PRN
  Administered 2016-05-12: 2 mg via INTRAVENOUS

## 2016-05-12 MED ORDER — FAMOTIDINE 20 MG PO TABS
ORAL_TABLET | ORAL | Status: AC
  Administered 2016-05-12: 20 mg via ORAL
  Filled 2016-05-12: qty 1

## 2016-05-12 MED ORDER — TRAMADOL HCL 50 MG PO TABS
50.0000 mg | ORAL_TABLET | ORAL | Status: DC | PRN
  Administered 2016-05-12: 50 mg via ORAL
  Administered 2016-05-13 – 2016-05-14 (×2): 100 mg via ORAL
  Filled 2016-05-12: qty 2
  Filled 2016-05-12: qty 1
  Filled 2016-05-12 (×2): qty 2

## 2016-05-12 MED ORDER — BUPROPION HCL ER (XL) 300 MG PO TB24
300.0000 mg | ORAL_TABLET | Freq: Every day | ORAL | Status: DC
  Administered 2016-05-13 – 2016-05-14 (×2): 300 mg via ORAL
  Filled 2016-05-12 (×3): qty 1

## 2016-05-12 MED ORDER — PROPOFOL 500 MG/50ML IV EMUL
INTRAVENOUS | Status: AC
  Filled 2016-05-12: qty 50

## 2016-05-12 MED ORDER — KETAMINE HCL 10 MG/ML IJ SOLN
INTRAMUSCULAR | Status: DC | PRN
  Administered 2016-05-12: 50 mg via INTRAVENOUS

## 2016-05-12 MED ORDER — PROMETHAZINE HCL 25 MG/ML IJ SOLN
INTRAMUSCULAR | Status: AC
  Administered 2016-05-12: 6.25 mg via INTRAVENOUS
  Filled 2016-05-12: qty 1

## 2016-05-12 MED ORDER — SIMETHICONE 80 MG PO CHEW: 80.0000 mg | CHEWABLE_TABLET | Freq: Four times a day (QID) | ORAL | Status: DC | PRN

## 2016-05-12 MED ORDER — SODIUM CHLORIDE 0.9 % IV SOLN
INTRAVENOUS | Status: DC | PRN
  Administered 2016-05-12: 60 mL

## 2016-05-12 MED ORDER — KETAMINE HCL 10 MG/ML IJ SOLN
INTRAMUSCULAR | Status: AC
  Filled 2016-05-12: qty 1

## 2016-05-12 MED ORDER — TETRACAINE HCL 1 % IJ SOLN
INTRAMUSCULAR | Status: AC
  Filled 2016-05-12: qty 2

## 2016-05-12 MED ORDER — LIDOCAINE 2% (20 MG/ML) 5 ML SYRINGE
INTRAMUSCULAR | Status: AC
  Filled 2016-05-12: qty 5

## 2016-05-12 MED ORDER — FENTANYL CITRATE (PF) 100 MCG/2ML IJ SOLN
INTRAMUSCULAR | Status: AC
  Filled 2016-05-12: qty 2

## 2016-05-12 MED ORDER — SENNOSIDES-DOCUSATE SODIUM 8.6-50 MG PO TABS
1.0000 | ORAL_TABLET | Freq: Two times a day (BID) | ORAL | Status: DC
  Administered 2016-05-12 – 2016-05-14 (×4): 1 via ORAL
  Filled 2016-05-12 (×4): qty 1

## 2016-05-12 MED ORDER — CEFAZOLIN SODIUM-DEXTROSE 2-4 GM/100ML-% IV SOLN
2.0000 g | Freq: Four times a day (QID) | INTRAVENOUS | Status: AC
  Administered 2016-05-12 – 2016-05-13 (×4): 2 g via INTRAVENOUS
  Filled 2016-05-12 (×4): qty 100

## 2016-05-12 MED ORDER — TRANEXAMIC ACID 1000 MG/10ML IV SOLN
1000.0000 mg | Freq: Once | INTRAVENOUS | Status: AC
  Administered 2016-05-12: 1000 mg via INTRAVENOUS
  Filled 2016-05-12: qty 10

## 2016-05-12 MED ORDER — FENTANYL CITRATE (PF) 100 MCG/2ML IJ SOLN: 25.0000 ug | INTRAMUSCULAR | Status: DC | PRN

## 2016-05-12 SURGICAL SUPPLY — 62 items
AUTOTRANSFUS HAS 1/8 (MISCELLANEOUS) ×3
BATTERY INSTRU NAVIGATION (MISCELLANEOUS) ×12 IMPLANT
BLADE SAW 1 (BLADE) ×3 IMPLANT
BLADE SAW 1/2 (BLADE) ×3 IMPLANT
BLADE SAW 70X12.5 (BLADE) ×2 IMPLANT
BONE CEMENT GENTAMICIN (Cement) ×6 IMPLANT
BTRY SRG DRVR LF (MISCELLANEOUS) ×4
CANISTER SUCT 1200ML W/VALVE (MISCELLANEOUS) ×3 IMPLANT
CANISTER SUCT 3000ML (MISCELLANEOUS) ×6 IMPLANT
CAPT KNEE TOTAL 3 ATTUNE ×2 IMPLANT
CATH TRAY METER 16FR LF (MISCELLANEOUS) ×3 IMPLANT
CEMENT BONE GENTAMICIN 40 (Cement) IMPLANT
COOLER POLAR GLACIER W/PUMP (MISCELLANEOUS) ×3 IMPLANT
CUFF TOURN 24 STER (MISCELLANEOUS) IMPLANT
CUFF TOURN 30 STER DUAL PORT (MISCELLANEOUS) ×2 IMPLANT
DRAPE SHEET LG 3/4 BI-LAMINATE (DRAPES) ×3 IMPLANT
DRSG DERMACEA 8X12 NADH (GAUZE/BANDAGES/DRESSINGS) ×3 IMPLANT
DRSG OPSITE POSTOP 4X14 (GAUZE/BANDAGES/DRESSINGS) ×3 IMPLANT
DRSG TEGADERM 4X4.75 (GAUZE/BANDAGES/DRESSINGS) ×3 IMPLANT
DURAPREP 26ML APPLICATOR (WOUND CARE) ×6 IMPLANT
ELECT CAUTERY BLADE 6.4 (BLADE) ×3 IMPLANT
ELECT REM PT RETURN 9FT ADLT (ELECTROSURGICAL) ×3
ELECTRODE REM PT RTRN 9FT ADLT (ELECTROSURGICAL) ×1 IMPLANT
EX-PIN ORTHOLOCK NAV 4X150 (PIN) ×6 IMPLANT
GLOVE BIOGEL M STRL SZ7.5 (GLOVE) ×6 IMPLANT
GLOVE INDICATOR 8.0 STRL GRN (GLOVE) ×3 IMPLANT
GLOVE SURG 9.0 ORTHO LTXF (GLOVE) ×3 IMPLANT
GLOVE SURG ORTHO 9.0 STRL STRW (GLOVE) ×3 IMPLANT
GOWN STRL REUS W/ TWL LRG LVL3 (GOWN DISPOSABLE) ×2 IMPLANT
GOWN STRL REUS W/TWL 2XL LVL3 (GOWN DISPOSABLE) ×3 IMPLANT
GOWN STRL REUS W/TWL LRG LVL3 (GOWN DISPOSABLE) ×6
HANDPIECE INTERPULSE COAX TIP (DISPOSABLE) ×3
HOLDER FOLEY CATH W/STRAP (MISCELLANEOUS) ×3 IMPLANT
HOOD PEEL AWAY FLYTE STAYCOOL (MISCELLANEOUS) ×6 IMPLANT
KIT RM TURNOVER STRD PROC AR (KITS) ×3 IMPLANT
KNIFE SCULPS 14X20 (INSTRUMENTS) ×3 IMPLANT
LABEL OR SOLS (LABEL) ×3 IMPLANT
NDL SAFETY 18GX1.5 (NEEDLE) ×3 IMPLANT
NEEDLE SPNL 20GX3.5 QUINCKE YW (NEEDLE) ×3 IMPLANT
NS IRRIG 500ML POUR BTL (IV SOLUTION) ×3 IMPLANT
PACK TOTAL KNEE (MISCELLANEOUS) ×3 IMPLANT
PAD WRAPON POLAR KNEE (MISCELLANEOUS) ×1 IMPLANT
PIN FIXATION 1/8DIA X 3INL (PIN) ×3 IMPLANT
SET HNDPC FAN SPRY TIP SCT (DISPOSABLE) ×1 IMPLANT
SOL .9 NS 3000ML IRR  AL (IV SOLUTION) ×2
SOL .9 NS 3000ML IRR AL (IV SOLUTION) ×1
SOL .9 NS 3000ML IRR UROMATIC (IV SOLUTION) ×1 IMPLANT
SOL PREP PVP 2OZ (MISCELLANEOUS) ×3
SOLUTION PREP PVP 2OZ (MISCELLANEOUS) ×1 IMPLANT
SPONGE DRAIN TRACH 4X4 STRL 2S (GAUZE/BANDAGES/DRESSINGS) ×3 IMPLANT
STAPLER SKIN PROX 35W (STAPLE) ×3 IMPLANT
SUCTION FRAZIER HANDLE 10FR (MISCELLANEOUS) ×2
SUCTION TUBE FRAZIER 10FR DISP (MISCELLANEOUS) ×1 IMPLANT
SUT VIC AB 0 CT1 36 (SUTURE) ×3 IMPLANT
SUT VIC AB 1 CT1 36 (SUTURE) ×6 IMPLANT
SUT VIC AB 2-0 CT2 27 (SUTURE) ×3 IMPLANT
SYR 20CC LL (SYRINGE) ×3 IMPLANT
SYR 30ML LL (SYRINGE) ×6 IMPLANT
SYSTEM AUTOTRANSFUS DUAL TROCR (MISCELLANEOUS) ×1 IMPLANT
TOWEL OR 17X26 4PK STRL BLUE (TOWEL DISPOSABLE) ×3 IMPLANT
TOWER CARTRIDGE SMART MIX (DISPOSABLE) ×3 IMPLANT
WRAPON POLAR PAD KNEE (MISCELLANEOUS) ×3

## 2016-05-12 NOTE — NC FL2 (Signed)
[place] LEVEL OF CARE SCREENING TOOL     IDENTIFICATION  Patient Name: Kristie Byrd Birthdate: 1945-06-06 Sex: female Admission Date (Current Location): 05/12/2016  [place] and [place] Number:  Engineering geologist and Address:  [facility], [address] [place], [place] [address]      Provider Number: [id number]  Attending Physician Name and Address:  Dereck Leep, MD  Relative Name and Phone Number:       Current Level of Care: Hospital Recommended Level of Care: [place] Prior Approval Number:    Date Approved/Denied:   PASRR Number:  ([id number])  Discharge Plan: SNF    Current Diagnoses: Patient Active Problem List   Diagnosis Date Noted  . S/P total knee arthroplasty 05/12/2016  . Primary osteoarthritis of right knee 03/30/2016  . Medicare annual wellness visit, subsequent 05/27/2015  . Failed hearing screening 05/27/2015  . COPD (chronic obstructive pulmonary disease) ([place]) 11/05/2013  . Iron deficiency anemia 09/27/2012  . GERD (gastroesophageal reflux disease) 08/16/2012  . Hypertension   . Depression   . Personal history of colonic adenoma 04/07/2001    Orientation RESPIRATION BLADDER Height & Weight     Self, Time, Situation, Place  Normal Continent Weight: 235 lb (106.6 kg) Height:  5\' 4"  (162.6 cm)  BEHAVIORAL SYMPTOMS/MOOD NEUROLOGICAL BOWEL NUTRITION STATUS   (none)  (none) Continent Diet (Diet: Clear Liquids )  AMBULATORY STATUS COMMUNICATION OF NEEDS Skin   Extensive Assist Verbally Surgical wounds (Incision: Right Knee)                       Personal Care Assistance Level of Assistance  Bathing, Feeding, Dressing Bathing Assistance: Limited assistance Feeding assistance: Independent Dressing Assistance: Limited assistance     Functional Limitations Info  Sight, Hearing, Speech Sight Info: Adequate Hearing Info: Adequate Speech Info: Adequate    SPECIAL CARE  FACTORS FREQUENCY  PT (By licensed PT), OT (By licensed OT)     PT Frequency:  (5) OT Frequency:  (5)            Contractures      Additional Factors Info  Code Status, Allergies Code Status Info:  (Full Code. ) Allergies Info:  (Other, Codeine, Demerol Meperidine, Fleet Phospho-soda Sodium Phosphates, Prednisone)           Current Medications (05/12/2016):  This is the current hospital active medication list Current Facility-Administered Medications  Medication Dose Route Frequency Provider Last Rate Last Dose  . 0.9 %  sodium chloride infusion   Intravenous Continuous Dereck Leep, MD 100 mL/hr at 05/12/16 1738    . acetaminophen (OFIRMEV) IV 1,000 mg  1,000 mg Intravenous Q6H Dereck Leep, MD   1,000 mg at 05/12/16 1737  . acetaminophen (TYLENOL) tablet 650 mg  650 mg Oral Q6H PRN Dereck Leep, MD       Or  . acetaminophen (TYLENOL) suppository 650 mg  650 mg Rectal Q6H PRN Dereck Leep, MD      . albuterol (PROVENTIL) (2.5 MG/3ML) 0.083% nebulizer solution 2.5 mg  2.5 mg Inhalation Q6H PRN Dereck Leep, MD      . alum & mag hydroxide-simeth (MAALOX/MYLANTA) 200-200-20 MG/5ML suspension 30 mL  30 mL Oral Q4H PRN Dereck Leep, MD      . bisacodyl (DULCOLAX) suppository 10 mg  10 mg Rectal Daily PRN Dereck Leep, MD      . buPROPion (WELLBUTRIN XL) 24  hr tablet 300 mg  300 mg Oral Daily Dereck Leep, MD      . ceFAZolin (ANCEF) IVPB 2g/100 mL premix  2 g Intravenous Q6H Dereck Leep, MD      . celecoxib (CELEBREX) capsule 200 mg  200 mg Oral Q12H Dereck Leep, MD      . cholecalciferol (VITAMIN D) tablet 1,000 Units  1,000 Units Oral Daily Dereck Leep, MD      . diphenhydrAMINE (BENADRYL) 12.5 MG/5ML elixir 12.5-25 mg  12.5-25 mg Oral Q4H PRN Dereck Leep, MD   12.5 mg at 05/12/16 1736  . [START ON 05/13/2016] enoxaparin (LOVENOX) injection 30 mg  30 mg Subcutaneous Q12H Dereck Leep, MD      . ferrous sulfate tablet 325 mg  325 mg Oral BID WC Dereck Leep, MD      . lisinopril (PRINIVIL,ZESTRIL) tablet 10 mg  10 mg Oral Daily Dereck Leep, MD   10 mg at 05/12/16 1750   And  . hydrochlorothiazide (MICROZIDE) capsule 12.5 mg  12.5 mg Oral Daily Dereck Leep, MD   12.5 mg at 05/12/16 1750  . magnesium hydroxide (MILK OF MAGNESIA) suspension 30 mL  30 mL Oral Daily PRN Dereck Leep, MD      . menthol-cetylpyridinium (CEPACOL) lozenge 3 mg  1 lozenge Oral PRN Dereck Leep, MD       Or  . phenol (CHLORASEPTIC) mouth spray 1 spray  1 spray Mouth/Throat PRN Dereck Leep, MD      . metaxalone [facility]) tablet 800 mg  800 mg Oral Q8H PRN Dereck Leep, MD      . metoCLOPramide (REGLAN) tablet 10 mg  10 mg Oral TID AC & HS Dereck Leep, MD   10 mg at 05/12/16 1750  . morphine 2 MG/ML injection 2 mg  2 mg Intravenous Q2H PRN Dereck Leep, MD   2 mg at 05/12/16 1714  . multivitamin with minerals tablet 1 tablet  1 tablet Oral QHS Dereck Leep, MD      . ondansetron (ZOFRAN) tablet 4 mg  4 mg Oral Q6H PRN Dereck Leep, MD       Or  . ondansetron (ZOFRAN) injection 4 mg  4 mg Intravenous Q6H PRN Dereck Leep, MD      . oxyCODONE (Oxy IR/ROXICODONE) immediate release tablet 5-10 mg  5-10 mg Oral Q4H PRN Dereck Leep, MD      . pantoprazole (PROTONIX) EC tablet 40 mg  40 mg Oral BID Dereck Leep, MD      . polyvinyl alcohol (LIQUIFILM TEARS) 1.4 % ophthalmic solution 1 drop  1 drop Both Eyes PRN Dereck Leep, MD      . senna-docusate (Senokot-S) tablet 1 tablet  1 tablet Oral BID Dereck Leep, MD      . simethicone (MYLICON) chewable tablet 80 mg  80 mg Oral Q6H PRN Dereck Leep, MD      . sodium chloride 0.9 % injection           . sodium phosphate (FLEET) 7-19 GM/118ML enema 1 enema  1 enema Rectal Once PRN Dereck Leep, MD      . tiotropium [facility]) inhalation capsule 18 mcg  1 capsule Inhalation Daily Dereck Leep, MD      . traMADol [facility]) tablet 50-100 mg  50-100 mg Oral Q4H PRN Dereck Leep, MD  Discharge Medications: Please see discharge summary for a list of discharge medications.  Relevant Imaging Results:  Relevant Lab Results:   Additional Information  (SSN: [id number])  Sample, Veronia Beets, LCSW

## 2016-05-12 NOTE — Transfer of Care (Signed)
Immediate Anesthesia Transfer of Care Note  Patient: Kristie Byrd  Procedure(s) Performed: Procedure(s): COMPUTER ASSISTED TOTAL KNEE ARTHROPLASTY (Right)  Patient Location: PACU  Anesthesia Type:Spinal  Level of Consciousness: awake  Airway & Oxygen Therapy: Patient connected to face mask oxygen  Post-op Assessment: Post -op Vital signs reviewed and stable  Post vital signs: stable  Last Vitals:  Vitals:   05/12/16 0936 05/12/16 1529  BP: 135/82 101/68  Pulse: 99 93  Resp: 20 20  Temp: 36.6 C 36.4 C    Last Pain:  Vitals:   05/12/16 1529  TempSrc: Temporal  PainSc:       Patients Stated Pain Goal: 2 ([id number])  Complications: No apparent anesthesia complications

## 2016-05-12 NOTE — Op Note (Signed)
OPERATIVE NOTE  DATE OF SURGERY:  05/12/2016  PATIENT NAME:  Kristie Byrd   DOB: 1946-04-07  MRN: [id number]  PRE-OPERATIVE DIAGNOSIS: Degenerative arthrosis of the right knee, primary  POST-OPERATIVE DIAGNOSIS:  Same  PROCEDURE:  Right total knee arthroplasty using computer-assisted navigation  SURGEON:  Marciano Sequin. M.D.  ASSISTANT:  Vance Peper, PA (present and scrubbed throughout the case, critical for assistance with exposure, retraction, instrumentation, and closure)  ANESTHESIA: spinal  ESTIMATED BLOOD LOSS: 100 mL  FLUIDS REPLACED: 1700 mL of crystalloid  TOURNIQUET TIME: 94 minutes  DRAINS: 2 medium drains to a reinfusion system  SOFT TISSUE RELEASES: Anterior cruciate ligament, posterior cruciate ligament, deep medial collateral ligament, patellofemoral ligament  IMPLANTS UTILIZED: DePuy Attune size 6 posterior stabilized femoral component (cemented), size 6 rotating platform tibial component (cemented), 35 mm medialized dome patella (cemented), and an 8 mm stabilized rotating platform polyethylene insert.  INDICATIONS FOR SURGERY: Kristie Byrd is a 70 y.o. year old female with a long history of progressive knee pain. X-rays demonstrated severe degenerative changes in tricompartmental fashion. The patient had not seen any significant improvement despite conservative nonsurgical intervention. After discussion of the risks and benefits of surgical intervention, the patient expressed understanding of the risks benefits and agree with plans for total knee arthroplasty.   The risks, benefits, and alternatives were discussed at length including but not limited to the risks of infection, bleeding, nerve injury, stiffness, blood clots, the need for revision surgery, cardiopulmonary complications, among others, and they were willing to proceed.  PROCEDURE IN DETAIL: The patient was brought into the operating room and, after adequate spinal anesthesia was achieved, a  tourniquet was placed on the patient's upper thigh. The patient's knee and leg were cleaned and prepped with alcohol and DuraPrep and draped in the usual sterile fashion. A "timeout" was performed as per usual protocol. The lower extremity was exsanguinated using an Esmarch, and the tourniquet was inflated to 300 mmHg. An anterior longitudinal incision was made followed by a standard mid vastus approach. The deep fibers of the medial collateral ligament were elevated in a subperiosteal fashion off of the medial flare of the tibia so as to maintain a continuous soft tissue sleeve. The patella was subluxed laterally and the patellofemoral ligament was incised. Inspection of the knee demonstrated severe degenerative changes with full-thickness loss of articular cartilage. Osteophytes were debrided using a rongeur. Anterior and posterior cruciate ligaments were excised. Two 4.0 mm Schanz pins were inserted in the femur and into the tibia for attachment of the array of trackers used for computer-assisted navigation. Hip center was identified using a circumduction technique. Distal landmarks were mapped using the computer. The distal femur and proximal tibia were mapped using the computer. The distal femoral cutting guide was positioned using computer-assisted navigation so as to achieve a 5 distal valgus cut. The femur was sized and it was felt that a size 6 femoral component was appropriate. A size 6 femoral cutting guide was positioned and the anterior cut was performed and verified using the computer. This was followed by completion of the posterior and chamfer cuts. Femoral cutting guide for the central box was then positioned in the center box cut was performed.  Attention was then directed to the proximal tibia. Medial and lateral menisci were excised. The extramedullary tibial cutting guide was positioned using computer-assisted navigation so as to achieve a 0 varus-valgus alignment and 3 posterior slope. The  cut was performed and verified using the computer.  The proximal tibia was sized and it was felt that a size 6 tibial tray was appropriate. Tibial and femoral trials were inserted followed by insertion of an 8 mm polyethylene insert. This allowed for excellent mediolateral soft tissue balancing both in flexion and in full extension. Finally, the patella was cut and prepared so as to accommodate a 35 mm medialized dome patella. A patella trial was placed and the knee was placed through a range of motion with excellent patellar tracking appreciated. The femoral trial was removed after debridement of posterior osteophytes. The central post-hole for the tibial component was reamed followed by insertion of a keel punch. Tibial trials were then removed. Cut surfaces of bone were irrigated with copious amounts of normal saline with antibiotic solution using pulsatile lavage and then suctioned dry. Polymethylmethacrylate cement with gentamicin was prepared in the usual fashion using a vacuum mixer. Cement was applied to the cut surface of the proximal tibia as well as along the undersurface of a size 6 rotating platform tibial component. Tibial component was positioned and impacted into place. Excess cement was removed using [employer] fast streamer. Cement was then applied to the cut surfaces of the femur as well as along the posterior flanges of the size 6 femoral component. The femoral component was positioned and impacted into place. Excess cement was removed using [employer] fast streamer. An 8 mm polyethylene trial was inserted and the knee was brought into full extension with steady axial compression applied. Finally, cement was applied to the backside of a 35 mm medialized dome patella and the patellar component was positioned and patellar clamp applied. Excess cement was removed using [employer] fast streamer. After adequate curing of the cement, the tourniquet was deflated after a total tourniquet time of 94 minutes. Hemostasis was achieved  using electrocautery. The knee was irrigated with copious amounts of normal saline with antibiotic solution using pulsatile lavage and then suctioned dry. 20 mL of 1.3% Exparel and 60 mL of 0.25% Marcaine in 40 mL of normal saline was injected along the posterior capsule, medial and lateral gutters, and along the arthrotomy site. An 8 mm stabilized rotating platform polyethylene insert was inserted and the knee was placed through a range of motion with excellent mediolateral soft tissue balancing appreciated and excellent patellar tracking noted. 2 medium drains were placed in the wound bed and brought out through separate stab incisions to be attached to a reinfusion system. The medial parapatellar portion of the incision was reapproximated using interrupted sutures of #1 Vicryl. Subcutaneous tissue was approximated in layers using first #0 Vicryl followed #2-0 Vicryl. The skin was approximated with skin staples. A sterile dressing was applied.  The patient tolerated the procedure well and was transported to the recovery room in stable condition.    Kristie Byrd., M.D.

## 2016-05-12 NOTE — Progress Notes (Signed)
Spoke to Dr. Marry Guan regarding patient's pain. He ordered skelaxin 800mg  q8PRN for muscle spasms.

## 2016-05-12 NOTE — Anesthesia Preprocedure Evaluation (Signed)
Anesthesia Evaluation  Patient identified by MRN, date of birth, ID band Patient awake    Reviewed: Allergy & Precautions, NPO status , Patient's Chart, lab work & pertinent test results  History of Anesthesia Complications (+) PONV and history of anesthetic complications  Airway Mallampati: II  TM Distance: >3 FB Neck ROM: Full    Dental  (+) Lower Dentures, Upper Dentures   Pulmonary neg sleep apnea, COPD,  COPD inhaler, former smoker,    breath sounds clear to auscultation- rhonchi (-) wheezing      Cardiovascular hypertension, Pt. on medications (-) CAD and (-) Past MI  Rhythm:Regular Rate:Normal - Systolic murmurs and - Diastolic murmurs    Neuro/Psych  Headaches, Depression    GI/Hepatic Neg liver ROS, GERD  ,  Endo/Other  negative endocrine ROSneg diabetes  Renal/GU negative Renal ROS     Musculoskeletal  (+) Arthritis ,   Abdominal (+) + obese,   Peds  Hematology  (+) anemia ,   Anesthesia Other Findings Past Medical History: No date: Arthritis No date: Cancer (HCC)     Comment: cervix, skin No date: Colon polyps No date: COPD (chronic obstructive pulmonary disease) (* No date: Depression No date: GERD (gastroesophageal reflux disease) No date: Headache No date: Hypertension 09/27/2012: Iron deficiency anemia No date: PONV (postoperative nausea and vomiting) No date: Shingles   Reproductive/Obstetrics                             Anesthesia Physical Anesthesia Plan  ASA: III  Anesthesia Plan: Spinal   Post-op Pain Management:    Induction:   Airway Management Planned: Natural Airway  Additional Equipment:   Intra-op Plan:   Post-operative Plan:   Informed Consent: I have reviewed the patients History and Physical, chart, labs and discussed the procedure including the risks, benefits and alternatives for the proposed anesthesia with the patient or authorized  representative who has indicated his/her understanding and acceptance.   Dental advisory given  Plan Discussed with: Anesthesiologist and CRNA  Anesthesia Plan Comments:         Lab Results  Component Value Date   WBC 4.7 04/28/2016   HGB 10.7 (L) 04/28/2016   HCT 31.7 (L) 04/28/2016   MCV 88.9 04/28/2016   PLT 283 04/28/2016    Anesthesia Quick Evaluation

## 2016-05-12 NOTE — H&P (Signed)
The patient has been re-examined, and the chart reviewed, and there have been no interval changes to the documented history and physical.    The risks, benefits, and alternatives have been discussed at length. The patient expressed understanding of the risks benefits and agreed with plans for surgical intervention.  Blossie Raffel P. Cameshia Cressman, Jr. M.D.    

## 2016-05-12 NOTE — Brief Op Note (Signed)
05/12/2016  3:30 PM  PATIENT:  Kristie Byrd  70 y.o. female  PRE-OPERATIVE DIAGNOSIS:  primary osteoarthritis right knee  POST-OPERATIVE DIAGNOSIS:  primary osteoarthritis right knee  PROCEDURE:  Procedure(s): COMPUTER ASSISTED TOTAL KNEE ARTHROPLASTY (Right)  SURGEON:  Surgeon(s) and Role:    * Dereck Leep, MD - Primary  ASSISTANTS: Vance Peper, PA   ANESTHESIA:   spinal  EBL:  Total I/O In: 1700 [I.V.:1700] Out: 300 [Urine:200; Blood:100]  BLOOD ADMINISTERED:none  DRAINS: 2 medium drains to a reinfusion system   LOCAL MEDICATIONS USED:  MARCAINE    and OTHER Exparel  SPECIMEN:  No Specimen  DISPOSITION OF SPECIMEN:  N/A  COUNTS:  YES  TOURNIQUET:   94 minutes  DICTATION: .Dragon Dictation  PLAN OF CARE: Admit to inpatient   PATIENT DISPOSITION:  PACU - hemodynamically stable.   Delay start of Pharmacological VTE agent (>24hrs) due to surgical blood loss or risk of bleeding: yes

## 2016-05-12 NOTE — Anesthesia Procedure Notes (Signed)
Spinal  Patient location during procedure: OR Start time: 05/12/2016 11:57 AM End time: 05/12/2016 12:01 PM Staffing Anesthesiologist: Randa Lynn, AMY Resident/CRNA: Rolla Plate Performed: resident/CRNA  Preanesthetic Checklist Completed: patient identified, site marked, surgical consent, pre-op evaluation, timeout performed, IV checked, risks and benefits discussed and monitors and equipment checked Spinal Block Patient position: sitting Prep: ChloraPrep Patient monitoring: heart rate, continuous pulse ox, blood pressure and cardiac monitor Approach: midline Location: L4-5 Injection technique: single-shot Needle Needle type: Whitacre and Introducer  Needle gauge: 24 G Needle length: 9 cm Additional Notes Negative paresthesia. Negative blood return. Positive free-flowing CSF. Expiration date of kit checked and confirmed. Patient tolerated procedure well, without complications.

## 2016-05-13 ENCOUNTER — Encounter: Payer: Self-pay | Admitting: Orthopedic Surgery

## 2016-05-13 LAB — CBC
HCT: 26.6 % — ABNORMAL LOW (ref 35.0–47.0)
Hemoglobin: 9 g/dL — ABNORMAL LOW (ref 12.0–16.0)
MCH: 30.1 pg (ref 26.0–34.0)
MCHC: 33.8 g/dL (ref 32.0–36.0)
MCV: 89.1 fL (ref 80.0–100.0)
PLATELETS: 239 10*3/uL (ref 150–440)
RBC: 2.98 MIL/uL — AB (ref 3.80–5.20)
RDW: 14.1 % (ref 11.5–14.5)
WBC: 6.7 10*3/uL (ref 3.6–11.0)

## 2016-05-13 LAB — BASIC METABOLIC PANEL
Anion gap: 5 (ref 5–15)
BUN: 18 mg/dL (ref 6–20)
CALCIUM: 8.3 mg/dL — AB (ref 8.9–10.3)
CO2: 26 mmol/L (ref 22–32)
Chloride: 105 mmol/L (ref 101–111)
Creatinine, Ser: 1.16 mg/dL — ABNORMAL HIGH (ref 0.44–1.00)
GFR calc Af Amer: 54 mL/min — ABNORMAL LOW (ref >60)
GFR, EST NON AFRICAN AMERICAN: 47 mL/min — AB (ref >60)
Glucose, Bld: 114 mg/dL — ABNORMAL HIGH (ref 65–99)
POTASSIUM: 4 mmol/L (ref 3.5–5.1)
SODIUM: 136 mmol/L (ref 135–145)

## 2016-05-13 NOTE — Clinical Social Work Note (Signed)
Clinical Social Work Assessment  Patient Details  Name: Kristie Byrd MRN: [id number] Date of Birth: 1945-10-04  Date of referral:  05/13/16               Reason for consult:  Facility Placement                Permission sought to share information with:  Chartered certified accountant granted to share information::  Yes, Verbal Permission Granted  Name::      [place]::   [place]   Relationship::     Contact Information:     Housing/Transportation Living arrangements for the past 2 months:  [place] of Information:  Patient, Adult Children Patient Interpreter Needed:  None Criminal Activity/Legal Involvement Pertinent to Current Situation/Hospitalization:  No - Comment as needed Significant Relationships:  Adult Children Lives with:  Self Do you feel safe going back to the place where you live?  Yes Need for family participation in patient care:  Yes (Comment)  Care giving concerns:  Patient lives alone in [place].    Social Worker assessment / plan:  Holiday representative ([place]) received SNF consult. PT is recommending SNF. CSW met with patient and her daughter Sharyn Lull was at bedside. Patient was alert and oriented and sitting up in the chair. CSW introduced self and explained role of CSW department. CSW is familiar with patient from [facility]. Patient reported that she lives alone in [place] and prefers to go home but is agreeable to SNF search. CSW explained that PT is recommending SNF VS. Home health and she is on the borderline. CSW explained that her insurance Health Team will have to approve SNF. Patient and daughter are agreeable to SNF search. FL2 complete and faxed out. CSW will continue to follow and assist as needed.   Employment status:  Retired Nurse, adult PT Recommendations:  [place] / Referral to community resources:  [place]  Patient/Family's Response to care:  Patient is agreeable to [facility].   Patient/Family's Understanding of and Emotional Response to Diagnosis, Current Treatment, and Prognosis:  Patient was pleasant and thanked CSW for visit.   Emotional Assessment Appearance:  Appears stated age Attitude/Demeanor/Rapport:    Affect (typically observed):  Accepting, Adaptable, Pleasant Orientation:  Oriented to Self, Oriented to Place, Oriented to  Time, Oriented to Situation Alcohol / Substance use:  Not Applicable Psych involvement (Current and /or in the community):  No (Comment)  Discharge Needs  Concerns to be addressed:  Discharge Planning Concerns Readmission within the last 30 days:  No Current discharge risk:  Dependent with Mobility Barriers to Discharge:  Continued Medical Work up   [employer], Veronia Beets, LCSW 05/13/2016, 1:38 PM

## 2016-05-13 NOTE — Care Management (Signed)
Consult received for discharge planning.  As needed. PT currently at SNF vs. HH. CM following and will assist

## 2016-05-13 NOTE — Progress Notes (Signed)
Patient is Alert and oriented. Pain is controlled with current medication.  autovac had 123ml output and converted to hemovac.  No signs of distress and appears to have slept well

## 2016-05-13 NOTE — Evaluation (Signed)
Physical Therapy Evaluation Patient Details Name: Kristie Byrd MRN: [id number] DOB: [birth year] Today's Date: 05/13/2016   History of Present Illness  70 y/o female s/p R TKA 12/27  Clinical Impression  Pt showed very good effort with PT exam and overall is at or beyond expected first session milestones.  She walked ~45 ft, was able to do most mobility w/o direct assist, and had >60 degrees of flexion.  She had general expected pain and weakness with ~10 minutes of exercises apart from the PT exam and was tired and light headed after session but generally did well despite pain and fatigue.  Pt hoping to go home (where she is alone but will have some assist) if she is safe and capable.     Follow Up Recommendations SNF;Home health PT (per progress)    Equipment Recommendations  Rolling walker with 5" wheels;3in1 (PT)    Recommendations for Other Services       Precautions / Restrictions Precautions Precautions: Fall Restrictions Weight Bearing Restrictions: Yes RLE Weight Bearing: Weight bearing as tolerated      Mobility  Bed Mobility Overal bed mobility: Modified Independent             General bed mobility comments: Pt needing to use hand rails, but did well getting herself to EOB w/o direct assist  Transfers Overall transfer level: Needs assistance Equipment used: Rolling walker (2 wheeled) Transfers: Sit to/from Stand Sit to Stand: Min guard         General transfer comment: Pt needed cuing for hand placement and set up but generally was confident getting to standing with KI donned using walker  Ambulation/Gait Ambulation/Gait assistance: Min guard;Min assist Ambulation Distance (Feet): 45 Feet Assistive device: Rolling walker (2 wheeled)       General Gait Details: Pt did quite well for first ambulation attempt post surgery.  She did have some fatigue/dizziness with the effort but ultimately was able to take weight confidently through the R LE and  maintained slow but consistent cadence  Stairs            Wheelchair Mobility    Modified Rankin (Stroke Patients Only)       Balance Overall balance assessment: Modified Independent                                           Pertinent Vitals/Pain Pain Assessment: 0-10 Pain Score: 4  (increases considerably with exercises and especially ROM)    Home Living Family/patient expects to be discharged to:: Private residence Living Arrangements: Alone Available Help at Discharge: Family (daughters work but can help some, probably neighbors too)   Home Access: Stairs to enter Entrance Stairs-Rails: None Technical brewer of Steps: 1   Home Equipment: Environmental consultant - 4 wheels      Prior Function Level of Independence: Independent         Comments: Until recently (secondary to R Knee pain and needing a walker) she was independent and able to do all her errands, etc     Hand Dominance        Extremity/Trunk Assessment   Upper Extremity Assessment Upper Extremity Assessment: Overall WFL for tasks assessed    Lower Extremity Assessment Lower Extremity Assessment: RLE deficits/detail RLE Deficits / Details: Pt with considerable pain with most R LE exercises - she shows good effort and has expected post-op weakness - unable to  do SLRs       Communication   Communication: No difficulties  Cognition Arousal/Alertness: Awake/alert Behavior During Therapy: WFL for tasks assessed/performed Overall Cognitive Status: Within Functional Limits for tasks assessed                      General Comments      Exercises Total Joint Exercises Ankle Circles/Pumps: AROM;10 reps Quad Sets: Strengthening;15 reps Gluteal Sets: Strengthening;15 reps Heel Slides: AAROM;5 reps Hip ABduction/ADduction: Strengthening;10 reps;AROM Knee Flexion: PROM;5 reps Goniometric ROM: 0-62   Assessment/Plan    PT Assessment Patient needs continued PT services  PT  Problem List Decreased strength;Decreased range of motion;Decreased activity tolerance;Decreased balance;Decreased mobility;Decreased coordination;Decreased cognition;Decreased knowledge of use of DME;Decreased safety awareness;Pain          PT Treatment Interventions DME instruction;Gait training;Functional mobility training;Stair training;Therapeutic activities;Therapeutic exercise;Balance training;Neuromuscular re-education;Cognitive remediation;Patient/family education    PT Goals (Current goals can be found in the Care Plan section)  Acute Rehab PT Goals Patient Stated Goal: go home PT Goal Formulation: With patient/family Time For Goal Achievement: 05/27/16 Potential to Achieve Goals: Fair    Frequency Min 2X/week   Barriers to discharge        Co-evaluation               End of Session Equipment Utilized During Treatment: Gait belt Activity Tolerance: Patient tolerated treatment well Patient left: with chair alarm set;with call bell/phone within reach;with family/visitor present Nurse Communication: Patient requests pain meds         Time: 0927-1015 PT Time Calculation (min) (ACUTE ONLY): 48 min   Charges:   PT Evaluation $PT Eval Low Complexity: 1 Procedure PT Treatments $Therapeutic Exercise: 8-22 mins   PT G Codes:        Kreg Shropshire, DPT 05/13/2016, 11:00 AM

## 2016-05-13 NOTE — Progress Notes (Signed)
Physical Therapy Treatment Patient Details Name: Kristie Byrd MRN: [id number] DOB: 24-Jun-1945 Today's Date: 05/13/2016    History of Present Illness 70 y/o female s/p R TKA 12/27    PT Comments    Pt continues to have some anxiety t/o session but generally did well with all functional mobility and even with walking despite having considerable fatigue.  She was able to ambulate >50 ft, do 10 SLRs, and achieve >70 degrees of flexion.  She had increased pain with most acts and needed regular cuing and rest breaks but overall is doing well.    Follow Up Recommendations  Home health PT     Equipment Recommendations  Rolling walker with 5" wheels;3in1 (PT)    Recommendations for Other Services       Precautions / Restrictions Precautions Precautions: Fall Restrictions RLE Weight Bearing: Weight bearing as tolerated    Mobility  Bed Mobility Overal bed mobility: Modified Independent             General bed mobility comments: Pt able to get herself back into bed to supine (and scoot up in bed) she asked for help lifting R LE into bed but did not need it  Transfers Overall transfer level: Needs assistance Equipment used: Rolling walker (2 wheeled) Transfers: Sit to/from Stand Sit to Stand: Min assist;Min guard         General transfer comment: Pt able to rise with cuing for set up and encouragement, she safe down uncontrolled and did need light assist for safety  Ambulation/Gait Ambulation/Gait assistance: Min guard Ambulation Distance (Feet): 60 Feet Assistive device: Rolling walker (2 wheeled)       General Gait Details: Pt continues to need a lot of UE assist (reliant on walker) for ambulation but generally was able to maintain decent speed and consistent cadence (pt did need 2 standing rest breaks secondary to fatigue)   Stairs            Wheelchair Mobility    Modified Rankin (Stroke Patients Only)       Balance Overall balance assessment:  Modified Independent                                  Cognition Arousal/Alertness: Lethargic Behavior During Therapy: WFL for tasks assessed/performed Overall Cognitive Status: Within Functional Limits for tasks assessed                      Exercises Total Joint Exercises Ankle Circles/Pumps: AROM;10 reps Quad Sets: Strengthening;15 reps Gluteal Sets: Strengthening;15 reps Short Arc Quad: AROM;10 reps Heel Slides: 5 reps;AAROM Hip ABduction/ADduction: Strengthening;10 reps Straight Leg Raises: AROM;10 reps Knee Flexion: PROM;5 reps Goniometric ROM: 0-72    General Comments        Pertinent Vitals/Pain Pain Assessment: 0-10 Pain Score: 4  (pain severely increased with ROM acts)    Home Living                      Prior Function            PT Goals (current goals can now be found in the care plan section) Progress towards PT goals: Progressing toward goals    Frequency    BID      PT Plan Current plan remains appropriate    Co-evaluation             End of Session Equipment Utilized  During Treatment: Gait belt Activity Tolerance: Patient tolerated treatment well;Patient limited by fatigue Patient left: with bed alarm set;with call bell/phone within reach;with family/visitor present     Time: [id number] PT Time Calculation (min) (ACUTE ONLY): 44 min  Charges:  $Gait Training: 8-22 mins $Therapeutic Exercise: 23-37 mins                    G Codes:      Kreg Shropshire, DPT 05/13/2016, 4:49 PM

## 2016-05-13 NOTE — Evaluation (Signed)
Occupational Therapy Evaluation Patient Details Name: LEILENE ONUFER MRN: [id number] DOB: April 24, 1946 Today's Date: 05/13/2016    History of Present Illness Pt. is a 70 y.o. female who was admitted to [facility] with a Right TKR.   Clinical Impression   Pt. Is a 70 y.o. female who was admitted for a right TKR. Pt presents with limited ROM, pain, weakness, and impaired functional mobility which hinder her ability to complete ADL and IADL tasks. Pt. could benefit from skilled OT services to review A/E use for LE ADLs, to review necessary home modifications, and to improve functional mobility for ADL/IADLs in order to work towards regaining Independence with ADL/IADLs.     Follow Up Recommendations  No OT follow up    Equipment Recommendations       Recommendations for Other Services       Precautions / Restrictions Precautions Precautions: Fall Restrictions Weight Bearing Restrictions: Yes RLE Weight Bearing: Weight bearing as tolerated              ADL Overall ADL's : Needs assistance/impaired                                       General ADL Comments: Pt., and daughter education was provided verbally about A/E use for LE ADLs.     Vision     Perception     Praxis      Pertinent Vitals/Pain Pain Assessment: 0-10 Pain Score: 0-No pain     Hand Dominance Left   Extremity/Trunk Assessment Upper Extremity Assessment Upper Extremity Assessment: Overall WFL for tasks assessed         Communication Communication Communication: No difficulties   Cognition Arousal/Alertness: Lethargic Behavior During Therapy: WFL for tasks assessed/performed Overall Cognitive Status: Within Functional Limits for tasks assessed                     General Comments       Exercises      Shoulder Instructions      Home Living Family/patient expects to be discharged to:: Private residence Living Arrangements: Alone Available Help at Discharge:  Family (daughters and neighbors)   Home Access: Stairs to enter Technical brewer of Steps: 1 Entrance Stairs-Rails: None Home Layout: One level     Bathroom Shower/Tub: Gaffer;Door   [employer] Toilet: Standard     Home Equipment: Environmental consultant - 4 wheels          Prior Functioning/Environment Level of Independence: Independent        Comments: Driving        OT Problem List: Decreased strength;Decreased range of motion;Decreased activity tolerance;Decreased knowledge of use of DME or AE   OT Treatment/Interventions: Self-care/ADL training;Therapeutic exercise;DME and/or AE instruction;Patient/family education;Therapeutic activities    OT Goals(Current goals can be found in the care plan section) Acute Rehab OT Goals Patient Stated Goal: To return home OT Goal Formulation: With patient Potential to Achieve Goals: Good  OT Frequency: Min 1X/week   Barriers to D/C:            Co-evaluation              End of Session    Activity Tolerance: Patient tolerated treatment well Patient left: in chair;with call bell/phone within reach;with chair alarm set   Time: 1050-1105 OT Time Calculation (min): 15 min Charges:  OT General Charges $OT Visit: 1 Procedure OT  Evaluation $OT Eval Low Complexity: 1 Procedure G-Codes:    Harrel Carina, MS, OTR/L 05/13/2016, 11:31 AM

## 2016-05-13 NOTE — Clinical Social Work Placement (Signed)
   CLINICAL SOCIAL WORK PLACEMENT  NOTE  Date:  05/13/2016  Patient Details  Name: Kristie Byrd MRN: [id number] Date of Birth: 06-28-1945  Clinical Social Work is seeking post-discharge placement for this patient at the [place] level of care (*CSW will initial, date and re-position this form in  chart as items are completed):  Yes   Patient/family provided with [place] Work Department's list of facilities offering this level of care within the geographic area requested by the patient (or if unable, by the patient's family).  Yes   Patient/family informed of their freedom to choose among providers that offer the needed level of care, that participate in Medicare, Medicaid or managed care program needed by the patient, have an available bed and are willing to accept the patient.  Yes   Patient/family informed of 's ownership interest in [facility] and [facility], as well as of the fact that they are under no obligation to receive care at these facilities.  PASRR submitted to EDS on 05/12/16     PASRR number received on 05/12/16     Existing PASRR number confirmed on       FL2 transmitted to all facilities in geographic area requested by pt/family on 05/13/16     FL2 transmitted to all facilities within larger geographic area on       Patient informed that his/her managed care company has contracts with or will negotiate with certain facilities, including the following:            Patient/family informed of bed offers received.  Patient chooses bed at       Physician recommends and patient chooses bed at      Patient to be transferred to   on  .  Patient to be transferred to facility by       Patient family notified on   of transfer.  Name of family member notified:        PHYSICIAN       Additional Comment:    _______________________________________________ Ayven Glasco, Veronia Beets, LCSW 05/13/2016, 1:31 PM

## 2016-05-13 NOTE — Anesthesia Postprocedure Evaluation (Signed)
Anesthesia Post Note  Patient: Kristie Byrd  Procedure(s) Performed: Procedure(s) (LRB): COMPUTER ASSISTED TOTAL KNEE ARTHROPLASTY (Right)  Patient location during evaluation: Nursing Unit Anesthesia Type: Spinal Level of consciousness: oriented and awake and alert Pain management: pain level controlled Vital Signs Assessment: post-procedure vital signs reviewed and stable Respiratory status: spontaneous breathing, respiratory function stable and patient connected to nasal cannula oxygen Cardiovascular status: blood pressure returned to baseline and stable Postop Assessment: no headache and no backache Anesthetic complications: no     Last Vitals:  Vitals:   05/12/16 2351 05/13/16 0449  BP: 124/68 (!) 113/58  Pulse: 82 83  Resp: 19 19  Temp: 36.3 C 36.4 C    Last Pain:  Vitals:   05/13/16 0615  TempSrc:   PainSc: Asleep                 Brantley Fling

## 2016-05-13 NOTE — Progress Notes (Signed)
ORTHOPAEDICS PROGRESS NOTE  PATIENT NAME: Kristie Byrd DOB: 12-09-1945  MRN: [id number]  POD # 1: Right total knee arthroplasty  Subjective: Patient had complaints of back pain and spasm yesterday. She Skelaxin was ordered and patient states that the back pain is well-controlled present time. Right knee pain is well-controlled with oral agents. The patient denies any nausea.  Objective: Vital signs in last 24 hours: Temp:  [97.4 F (36.3 C)-98.2 F (36.8 C)] 97.5 F (36.4 C) (12/28 0449) Pulse Rate:  [79-99] 83 (12/28 0449) Resp:  [13-22] 19 (12/28 0449) BP: (100-135)/(58-82) 113/58 (12/28 0449) SpO2:  [94 %-100 %] 95 % (12/28 0449) Weight:  [106.6 kg (235 lb)] 106.6 kg (235 lb) (12/27 0936)  Intake/Output from previous day: 12/27 0701 - 12/28 0700 In: 4016.7 [P.O.:480; I.V.:2936.7; IV Piggyback:600] Out: 1170 [Urine:900; Drains:170; Blood:100]   Recent Labs  05/13/16 0449  WBC 6.7  HGB 9.0*  HCT 26.6*  PLT 239  K 4.0  CL 105  CO2 26  BUN 18  CREATININE 1.16*  GLUCOSE 114*  CALCIUM 8.3*    EXAM General: Well-developed well-nourished female seen in no acute distress. Lungs: clear to auscultation Cardiac: normal rate and regular rhythm Right lower extremity: Right knee dressing is dry and intact. Hemovac drain is in place. Bone foam is in place with the knee fully extended. The patient is able to perform straight leg raise with minimal assistance. Homans test is negative. Neurologic: Awake, alert, and oriented. Sensory motor function are grossly intact.  Assessment: Right total knee arthroplasty  Secondary diagnoses: Acute blood loss anemia superimposed on chronic anemia (iron deficiency anemia) Gastroesophageal reflux disease Depression COPD Low back pain  Plan: Today's goal were reviewed with the patient.  Begin physical therapy and occupational therapy as per total knee arthroplasty rehabilitation protocol. Discharge destination to be determined  after physical therapy. FL-2 has been completed and signed. DVT Prophylaxis - Lovenox, Foot Pumps and TED hose  James P. Holley Bouche M.D.

## 2016-05-13 NOTE — Progress Notes (Signed)
Clinical Education officer, museum (CSW) presented bed offers to patient and her daughter. They chose [facility]. [facility] admissions coordinator at [facility] is aware of accepted bed offer. [facility] Team case manager is aware of above.   [employer], LCSW [phone]

## 2016-05-14 DIAGNOSIS — K219 Gastro-esophageal reflux disease without esophagitis: Secondary | ICD-10-CM | POA: Diagnosis not present

## 2016-05-14 DIAGNOSIS — M1712 Unilateral primary osteoarthritis, left knee: Secondary | ICD-10-CM | POA: Diagnosis not present

## 2016-05-14 DIAGNOSIS — M1990 Unspecified osteoarthritis, unspecified site: Secondary | ICD-10-CM | POA: Diagnosis not present

## 2016-05-14 DIAGNOSIS — H43819 Vitreous degeneration, unspecified eye: Secondary | ICD-10-CM | POA: Diagnosis not present

## 2016-05-14 DIAGNOSIS — Z7401 Bed confinement status: Secondary | ICD-10-CM | POA: Diagnosis not present

## 2016-05-14 DIAGNOSIS — Z96651 Presence of right artificial knee joint: Secondary | ICD-10-CM | POA: Diagnosis not present

## 2016-05-14 DIAGNOSIS — Z471 Aftercare following joint replacement surgery: Secondary | ICD-10-CM | POA: Diagnosis not present

## 2016-05-14 DIAGNOSIS — R6889 Other general symptoms and signs: Secondary | ICD-10-CM | POA: Diagnosis not present

## 2016-05-14 DIAGNOSIS — M6281 Muscle weakness (generalized): Secondary | ICD-10-CM | POA: Diagnosis not present

## 2016-05-14 DIAGNOSIS — I10 Essential (primary) hypertension: Secondary | ICD-10-CM | POA: Diagnosis not present

## 2016-05-14 DIAGNOSIS — R269 Unspecified abnormalities of gait and mobility: Secondary | ICD-10-CM | POA: Diagnosis not present

## 2016-05-14 DIAGNOSIS — F329 Major depressive disorder, single episode, unspecified: Secondary | ICD-10-CM | POA: Diagnosis not present

## 2016-05-14 DIAGNOSIS — R2681 Unsteadiness on feet: Secondary | ICD-10-CM | POA: Diagnosis not present

## 2016-05-14 LAB — BASIC METABOLIC PANEL
Anion gap: 5 (ref 5–15)
BUN: 22 mg/dL — ABNORMAL HIGH (ref 6–20)
CALCIUM: 8.4 mg/dL — AB (ref 8.9–10.3)
CO2: 28 mmol/L (ref 22–32)
CREATININE: 1.26 mg/dL — AB (ref 0.44–1.00)
Chloride: 103 mmol/L (ref 101–111)
GFR calc non Af Amer: 42 mL/min — ABNORMAL LOW (ref >60)
GFR, EST AFRICAN AMERICAN: 49 mL/min — AB (ref >60)
Glucose, Bld: 113 mg/dL — ABNORMAL HIGH (ref 65–99)
Potassium: 4.6 mmol/L (ref 3.5–5.1)
SODIUM: 136 mmol/L (ref 135–145)

## 2016-05-14 LAB — CBC
HCT: 27.2 % — ABNORMAL LOW (ref 35.0–47.0)
HEMOGLOBIN: 9.2 g/dL — AB (ref 12.0–16.0)
MCH: 30 pg (ref 26.0–34.0)
MCHC: 34 g/dL (ref 32.0–36.0)
MCV: 88.4 fL (ref 80.0–100.0)
Platelets: 241 10*3/uL (ref 150–440)
RBC: 3.07 MIL/uL — ABNORMAL LOW (ref 3.80–5.20)
RDW: 14 % (ref 11.5–14.5)
WBC: 7 10*3/uL (ref 3.6–11.0)

## 2016-05-14 MED ORDER — ENOXAPARIN SODIUM 30 MG/0.3ML ~~LOC~~ SOLN: 30.0000 mg | Freq: Two times a day (BID) | SUBCUTANEOUS | 0 refills | Status: DC

## 2016-05-14 MED ORDER — OXYCODONE HCL 5 MG PO TABS: 5.0000 mg | ORAL_TABLET | ORAL | 0 refills | Status: DC | PRN

## 2016-05-14 MED ORDER — LACTULOSE 10 GM/15ML PO SOLN
10.0000 g | Freq: Two times a day (BID) | ORAL | Status: DC | PRN
  Administered 2016-05-14: 10 g via ORAL
  Filled 2016-05-14: qty 30

## 2016-05-14 MED ORDER — TRAMADOL HCL 50 MG PO TABS: 50.0000 mg | ORAL_TABLET | ORAL | 0 refills | Status: DC | PRN

## 2016-05-14 NOTE — Discharge Summary (Signed)
Physician Discharge Summary  Patient ID: Kristie Byrd MRN: [id number] DOB/AGE: [birth year] 70 y.o.  Admit date: 05/12/2016 Discharge date: 05/14/2016  Admission Diagnoses:  primary osteoarthritis right knee   Discharge Diagnoses: Patient Active Problem List   Diagnosis Date Noted  . S/P total knee arthroplasty 05/12/2016  . Primary osteoarthritis of right knee 03/30/2016  . Medicare annual wellness visit, subsequent 05/27/2015  . Failed hearing screening 05/27/2015  . COPD (chronic obstructive pulmonary disease) ([place]) 11/05/2013  . Iron deficiency anemia 09/27/2012  . GERD (gastroesophageal reflux disease) 08/16/2012  . Hypertension   . Depression   . Personal history of colonic adenoma 04/07/2001    Past Medical History:  Diagnosis Date  . Arthritis   . Cancer ([place])    cervix, skin  . Colon polyps   . COPD (chronic obstructive pulmonary disease) ([place])   . Depression   . GERD (gastroesophageal reflux disease)   . Headache   . Hypertension   . Iron deficiency anemia 09/27/2012  . PONV (postoperative nausea and vomiting)   . Shingles      Transfusion: No transfusions given doing this admission   Consultants (if any):  case management for placement assistance  Discharged Condition: Improved  Hospital Course: Kristie Byrd is an 70 y.o. female who was admitted 05/12/2016 with a diagnosis of degenerative arthrosis right knee and went to the operating room on 05/12/2016 and underwent the above named procedures.    Surgeries:Procedure(s): COMPUTER ASSISTED TOTAL KNEE ARTHROPLASTY on 05/12/2016  PRE-OPERATIVE DIAGNOSIS: Degenerative arthrosis of the right knee, primary  POST-OPERATIVE DIAGNOSIS:  Same  PROCEDURE:  Right total knee arthroplasty using computer-assisted navigation  SURGEON:  Marciano Sequin. M.D.  ASSISTANT:  Vance Peper, PA (present and scrubbed throughout the case, critical for assistance with exposure, retraction, instrumentation, and  closure)  ANESTHESIA: spinal  ESTIMATED BLOOD LOSS: 100 mL  FLUIDS REPLACED: 1700 mL of crystalloid  TOURNIQUET TIME: 94 minutes  DRAINS: 2 medium drains to a reinfusion system  SOFT TISSUE RELEASES: Anterior cruciate ligament, posterior cruciate ligament, deep medial collateral ligament, patellofemoral ligament  IMPLANTS UTILIZED: DePuy Attune size 6 posterior stabilized femoral component (cemented), size 6 rotating platform tibial component (cemented), 35 mm medialized dome patella (cemented), and an 8 mm stabilized rotating platform polyethylene insert.  INDICATIONS FOR SURGERY: Kristie Byrd is a 70 y.o. year old female with a long history of progressive knee pain. X-rays demonstrated severe degenerative changes in tricompartmental fashion. The patient had not seen any significant improvement despite conservative nonsurgical intervention. After discussion of the risks and benefits of surgical intervention, the patient expressed understanding of the risks benefits and agree with plans for total knee arthroplasty.   The risks, benefits, and alternatives were discussed at length including but not limited to the risks of infection, bleeding, nerve injury, stiffness, blood clots, the need for revision surgery, cardiopulmonary complications, among others, and they were willing to proceed.  Patient tolerated the surgery well. No complications .Patient was taken to PACU where she was stabilized and then transferred to the orthopedic floor.  Patient started on Lovenox 30 mg q 12 hrs. Foot pumps applied bilaterally at 80 mm hgb. Heels elevated off bed with rolled towels. No evidence of DVT. Calves non tender. Negative Homan. Physical therapy started on day #1 for gait training and transfer with OT starting on  day #1 for ADL and assisted devices. Patient has done well with therapy. Ambulated greater than 50 feet upon being discharged.  Patient's IV and  Foley were discontinued on day #1  with Hemovac being discontinued on day #2. Dressing was also changed on day #2 prior to being discharged   She was given perioperative antibiotics:  Anti-infectives    Start     Dose/Rate Route Frequency Ordered Stop   05/12/16 1800  ceFAZolin (ANCEF) IVPB 2g/100 mL premix     2 g 200 mL/hr over 30 Minutes Intravenous Every 6 hours 05/12/16 1653 05/13/16 1214   05/12/16 0926  ceFAZolin (ANCEF) 2-4 GM/100ML-% IVPB    Comments:  register, karen: cabinet override      05/12/16 0926 05/12/16 1208   05/11/16 2215  ceFAZolin (ANCEF) IVPB 2g/100 mL premix     2 g 200 mL/hr over 30 Minutes Intravenous  Once 05/11/16 2203 05/12/16 1223    .  She was fitted with AV 1 compression foot pump devices, early ambulation, instructed on heel pumps, and TED stockings bilaterally for DVT prophylaxis.  She benefited maximally from the hospital stay and there were no complications.    Recent vital signs:  Vitals:   05/13/16 1957 05/14/16 0427  BP: (!) 103/55 (!) 113/58  Pulse: 99 89  Resp: 16 16  Temp: 98.6 F (37 C) 98 F (36.7 C)    Recent laboratory studies:  Lab Results  Component Value Date   HGB 9.2 (L) 05/14/2016   HGB 9.0 (L) 05/13/2016   HGB 10.7 (L) 04/28/2016   Lab Results  Component Value Date   WBC 7.0 05/14/2016   PLT 241 05/14/2016   Lab Results  Component Value Date   INR 0.95 04/28/2016   Lab Results  Component Value Date   NA 136 05/14/2016   K 4.6 05/14/2016   CL 103 05/14/2016   CO2 28 05/14/2016   BUN 22 (H) 05/14/2016   CREATININE 1.26 (H) 05/14/2016   GLUCOSE 113 (H) 05/14/2016    Discharge Medications:   Allergies as of 05/14/2016      Reactions   Other Other (See Comments)   Band-Aid, blister   Codeine Nausea And Vomiting   Demerol [meperidine] Nausea And Vomiting   Fleet Phospho-soda [sodium Phosphates]    Vomiting   Prednisone Other (See Comments)   Chest pressure      Medication List    STOP taking these medications   aspirin 81 MG  tablet     TAKE these medications   acetaminophen 650 MG CR tablet Commonly known as:  TYLENOL Take 1,300 mg by mouth every 8 (eight) hours as needed for pain.   ANBESOL MAXIMUM STRENGTH 20 % Gel Generic drug:  benzocaine Use as directed 1 application in the mouth or throat as needed.   BIOFREEZE EX Apply 1 application topically daily as needed (knee pain).   BLUE-EMU SUPER STRENGTH Crea Apply 1 application topically daily as needed (knee pain).   buPROPion 300 MG 24 hr tablet Commonly known as:  WELLBUTRIN XL take 1 tablet by mouth once daily What changed:  See the new instructions.   cyanocobalamin 1000 MCG/ML injection Commonly known as:  (VITAMIN B-12) inject 1 milliliter intramuscularly every month   diphenhydramine-acetaminophen 25-500 MG Tabs tablet Commonly known as:  TYLENOL PM Take 1-2 tablets by mouth at bedtime as needed (sleep).   enoxaparin 30 MG/0.3ML injection Commonly known as:  LOVENOX Inject 0.3 mLs (30 mg total) into the skin every 12 (twelve) hours.   ferrous sulfate 325 (65 FE) MG tablet take 1 tablet by mouth once daily WITH BREAKFAST What changed:  See the new instructions.   lisinopril-hydrochlorothiazide 10-12.5 MG tablet Commonly known as:  PRINZIDE,ZESTORETIC take 1 tablet by mouth once daily What changed:  See the new instructions.   meloxicam 15 MG tablet Commonly known as:  MOBIC take 1 tablet by mouth once daily What changed:  See the new instructions.   multivitamin tablet Take 1 tablet by mouth at bedtime.   oxyCODONE 5 MG immediate release tablet Commonly known as:  Oxy IR/ROXICODONE Take 1-2 tablets (5-10 mg total) by mouth every 4 (four) hours as needed for severe pain or breakthrough pain.   PROAIR HFA 108 (90 Base) MCG/ACT inhaler Generic drug:  albuterol inhale 2 puffs by mouth every 6 hours if needed for wheezing or shortness of breath   ranitidine 150 MG tablet Commonly known as:  ZANTAC Take 150 mg by mouth daily  as needed for heartburn.   simethicone 125 MG chewable tablet Commonly known as:  MYLICON Chew 0000000 mg by mouth every 6 (six) hours as needed for flatulence.   THERATEARS OP Apply 1 drop to eye daily as needed (dry eyes).   Tiotropium Bromide Monohydrate 2.5 MCG/ACT Aers Commonly known as:  SPIRIVA RESPIMAT Inhale 2 puffs into the lungs daily.   traMADol 50 MG tablet Commonly known as:  ULTRAM Take 1-2 tablets (50-100 mg total) by mouth every 4 (four) hours as needed for moderate pain.   Vitamin D3 1000 units Caps Take 1,000 Units by mouth daily.            Durable Medical Equipment        Start     Ordered   05/12/16 1654  DME Walker rolling  Once    Question:  Patient needs a walker to treat with the following condition  Answer:  Total knee replacement status   05/12/16 1653   05/12/16 1654  DME Bedside commode  Once    Question:  Patient needs a bedside commode to treat with the following condition  Answer:  Total knee replacement status   05/12/16 1653      Diagnostic Studies: Dg Knee Right Port  Result Date: 05/12/2016 CLINICAL DATA:  Postop right total knee arthroplasty EXAM: PORTABLE RIGHT KNEE - 1-2 VIEW COMPARISON:  09/30/2011 FINDINGS: Hardware components of a right total knee arthroplasty device identified. There are surgical drainage catheters overlying the knee. No periprosthetic fracture or subluxation identified. IMPRESSION: 1. No complications status post right total knee arthroplasty. Electronically Signed   By: Kerby Moors M.D.   On: 05/12/2016 15:54    Disposition: Final discharge disposition not confirmed  Discharge Instructions    Diet - low sodium heart healthy    Complete by:  As directed    Increase activity slowly    Complete by:  As directed        Contact information for follow-up providers    WOLFE,JON R., PA On 05/27/2016.   Specialty:  Physician Assistant Why:  at 1:15pm Contact information: [place] [address] [phone]        Dereck Leep, MD On 06/22/2016.   Specialty:  Orthopedic Surgery Why:  at 9:00am Contact information: [place] [address] [phone]            Contact information for after-discharge care    Destination    [facility] .   Specialties:  [place], [place] Contact information: [place]  [place] [phone]                   Signed: Watt Climes. 05/14/2016, 5:56 AM

## 2016-05-14 NOTE — Care Management Important Message (Signed)
Important Message  Patient Details  Name: Kristie Byrd MRN: [id number] Date of Birth: [birth year]   Medicare Important Message Given:  Yes    Jolly Mango, RN 05/14/2016, 9:01 AM

## 2016-05-14 NOTE — Progress Notes (Signed)
Physical Therapy Treatment Patient Details Name: WINNER BERGLAND MRN: [id number] DOB: 15-Apr-1946 Today's Date: 05/14/2016    History of Present Illness 70 y/o female s/p R TKA 12/27    PT Comments    Ms. Stannard continues to be anxious about any R knee flexion mobility and remains fixated on her pain despite efforts to distract pt on different topics.  Utilized R KI today as pt unable to achieve SLR without assist.  She ambulated 80 ft today with RW and min guard assist and required 2 standing rest breaks due to fatigue.  She tolerated all interventions and therapeutic exercises well today.  Updated d/c recommendation to SNF as pt will have limited assist at home and requires very close min guard assist as pt with mobility limitations.  Pt will benefit from continued skilled PT services to increase functional independence and safety.   Follow Up Recommendations  SNF     Equipment Recommendations  Rolling walker with 5" wheels;3in1 (PT)    Recommendations for Other Services       Precautions / Restrictions Precautions Precautions: Fall Required Braces or Orthoses: Knee Immobilizer - Right Knee Immobilizer - Right: Discontinue once straight leg raise with < 10 degree lag (wore today, unable to perform SLR without assist) Restrictions Weight Bearing Restrictions: Yes RLE Weight Bearing: Weight bearing as tolerated    Mobility  Bed Mobility Overal bed mobility: Modified Independent             General bed mobility comments: No assist or cues needed but increased time and effort with use of bed rail  Transfers Overall transfer level: Needs assistance Equipment used: Rolling walker (2 wheeled) Transfers: Sit to/from Stand Sit to Stand: Min guard         General transfer comment: Cues for hand placement and technique.  Pt is slow to stand and cues provided for upright posture once standing.  Ambulation/Gait Ambulation/Gait assistance: Min guard Ambulation Distance  (Feet): 80 Feet Assistive device: Rolling walker (2 wheeled) Gait Pattern/deviations: Step-to pattern;Decreased stride length;Decreased weight shift to right;Decreased stance time - right;Decreased step length - left;Antalgic;Trunk flexed Gait velocity: decreased Gait velocity interpretation: Below normal speed for age/gender General Gait Details: Cues for proper positioning as pt pushing it too close and meeting front bar with each step.  Cues for upright posture and to relax shoulder.  2 standing rest breaks needed due to fatigue.  Pulse on pulse ox reading up to 127 while ambulating.  SpO2 at or above 96% on RA while ambulating.   Stairs            Wheelchair Mobility    Modified Rankin (Stroke Patients Only)       Balance Overall balance assessment: Needs assistance Sitting-balance support: No upper extremity supported;Feet supported Sitting balance-Leahy Scale: Good     Standing balance support: During functional activity;Single extremity supported Standing balance-Leahy Scale: Fair Standing balance comment: Pt able to adjust gown in standing with just one UE support                     Cognition Arousal/Alertness: Awake/alert Behavior During Therapy: WFL for tasks assessed/performed Overall Cognitive Status: Within Functional Limits for tasks assessed                      Exercises Total Joint Exercises Ankle Circles/Pumps: AROM;20 reps;Supine;Seated;Both Quad Sets: Strengthening;Both;10 reps;Seated Gluteal Sets: Strengthening;Both;10 reps;Seated Hip ABduction/ADduction: Strengthening;10 reps;Right;Seated Straight Leg Raises: 10 reps;AAROM;Right;Seated Long Arc Quad: Strengthening;Right;10  reps;Seated Knee Flexion: AAROM;Other reps (comment);Seated (8 reps) Goniometric ROM: 0-84 R knee    General Comments        Pertinent Vitals/Pain Pain Assessment: Faces Faces Pain Scale: Hurts even more Pain Location: R knee Pain Descriptors /  Indicators: Grimacing;Guarding;Moaning Pain Intervention(s): Limited activity within patient's tolerance;Monitored during session;Repositioned;Premedicated before session;Other (comment) (applied Polar Care at end of session)    Home Living                      Prior Function            PT Goals (current goals can now be found in the care plan section) Acute Rehab PT Goals Patient Stated Goal: rehab before home PT Goal Formulation: With patient/family Time For Goal Achievement: 05/27/16 Potential to Achieve Goals: Fair Progress towards PT goals: Progressing toward goals    Frequency    BID      PT Plan Discharge plan needs to be updated    Co-evaluation             End of Session Equipment Utilized During Treatment: Gait belt;Right knee immobilizer Activity Tolerance: Patient tolerated treatment well;Patient limited by fatigue;Patient limited by pain Patient left: in chair;with chair alarm set;with call bell/phone within reach;Other (comment) (in bone foam with polar care applied)     Time: [id number] PT Time Calculation (min) (ACUTE ONLY): 35 min  Charges:  $Gait Training: 8-22 mins $Therapeutic Exercise: 8-22 mins                    G Codes:      Collie Siad PT, DPT 05/14/2016, 10:27 AM

## 2016-05-14 NOTE — Progress Notes (Signed)
Pt had positive results with [facility]. Pt ready for discharge to [facility]. Report given to [place], Therapist, sports. EMS contacted for transport. Pt understands discharge instructions.

## 2016-05-14 NOTE — Discharge Instructions (Signed)

## 2016-05-14 NOTE — Progress Notes (Signed)
   Subjective: 2 Days Post-Op Procedure(s) (LRB): COMPUTER ASSISTED TOTAL KNEE ARTHROPLASTY (Right) Patient reports pain as 7 on 0-10 scale.   Patient is well, and has had no acute complaints or problems Continue with physical therapy today.  Plan is to go Rehab after hospital stay. no nausea and no vomiting Patient denies any chest pains or shortness of breath. Objective: Vital signs in last 24 hours: Temp:  [97.7 F (36.5 C)-98.6 F (37 C)] 98 F (36.7 C) (12/29 0427) Pulse Rate:  [83-99] 89 (12/29 0427) Resp:  [16-18] 16 (12/29 0427) BP: (91-113)/(43-58) 113/58 (12/29 0427) SpO2:  [93 %-99 %] 94 % (12/29 0427) well approximated incision Heels are non tender and elevated off the bed using rolled towels Intake/Output from previous day: 12/28 0701 - 12/29 0700 In: 240 [P.O.:240] Out: 750 [Urine:600; Drains:150] Intake/Output this shift: Total I/O In: -  Out: 460 [Urine:400; Drains:60]   Recent Labs  05/13/16 0449 05/14/16 0441  HGB 9.0* 9.2*    Recent Labs  05/13/16 0449 05/14/16 0441  WBC 6.7 7.0  RBC 2.98* 3.07*  HCT 26.6* 27.2*  PLT 239 241    Recent Labs  05/13/16 0449 05/14/16 0441  NA 136 136  K 4.0 4.6  CL 105 103  CO2 26 28  BUN 18 22*  CREATININE 1.16* 1.26*  GLUCOSE 114* 113*  CALCIUM 8.3* 8.4*   No results for input(s): LABPT, INR in the last 72 hours.  EXAM General - Patient is Alert, Appropriate and Oriented Extremity - Neurologically intact Neurovascular intact Sensation intact distally Intact pulses distally Dorsiflexion/Plantar flexion intact No cellulitis present Compartment soft Dressing - dressing C/D/I Motor Function - intact, moving foot and toes well on exam.    Past Medical History:  Diagnosis Date  . Arthritis   . Cancer ([place])    cervix, skin  . Colon polyps   . COPD (chronic obstructive pulmonary disease) ([place])   . Depression   . GERD (gastroesophageal reflux disease)   . Headache   . Hypertension   . Iron  deficiency anemia 09/27/2012  . PONV (postoperative nausea and vomiting)   . Shingles     Assessment/Plan: 2 Days Post-Op Procedure(s) (LRB): COMPUTER ASSISTED TOTAL KNEE ARTHROPLASTY (Right) Active Problems:   S/P total knee arthroplasty  Estimated body mass index is 40.34 kg/m as calculated from the following:   Height as of this encounter: 5\' 4"  (1.626 m).   Weight as of this encounter: 106.6 kg (235 lb). Up with therapy Discharge to SNF  Labs: Were reviewed. hemoglobin up from yesterday. DVT Prophylaxis - Lovenox, Foot Pumps and TED hose Weight-Bearing as tolerated to right leg Hemovac was discontinued today Please change dressing prior to being discharged Patient needs to have a bowel movement prior to being discharged.  Jillyn Ledger. [place] 05/14/2016, 6:00 AM

## 2016-05-14 NOTE — Care Management Note (Signed)
Case Management Note  Patient Details  Name: Kristie Byrd MRN: [id number] Date of Birth: [birth year]  Subjective/Objective:   Discharging to [facility] today               Action/Plan:   Expected Discharge Date:    05/14/2016              Expected Discharge Plan:  Skilled Nursing Facility  In-House Referral:  Clinical Social Work  Discharge planning Services  CM Consult  Post Acute Care Choice:    Choice offered to:     DME Arranged:    DME Agency:     HH Arranged:    [place] Agency:     Status of Service:  Completed, signed off  If discussed at [employer], dates discussed:    Additional Comments:  Jolly Mango, RN 05/14/2016, 8:59 AM

## 2016-05-14 NOTE — Clinical Social Work Placement (Signed)
   CLINICAL SOCIAL WORK PLACEMENT  NOTE  Date:  05/14/2016  Patient Details  Name: Kristie Byrd MRN: [id number] Date of Birth: 05-25-1945  Clinical Social Work is seeking post-discharge placement for this patient at the [place] level of care (*CSW will initial, date and re-position this form in  chart as items are completed):  Yes   Patient/family provided with [place] Work Department's list of facilities offering this level of care within the geographic area requested by the patient (or if unable, by the patient's family).  Yes   Patient/family informed of their freedom to choose among providers that offer the needed level of care, that participate in Medicare, Medicaid or managed care program needed by the patient, have an available bed and are willing to accept the patient.  Yes   Patient/family informed of [facility]'s ownership interest in [facility] and [facility] as well as of the fact that they are under no obligation to receive care at these facilities.  PASRR submitted to EDS on 05/12/16     PASRR number received on 05/12/16     Existing PASRR number confirmed on       FL2 transmitted to all facilities in geographic area requested by pt/family on 05/13/16     FL2 transmitted to all facilities within larger geographic area on       Patient informed that his/her managed care company has contracts with or will negotiate with certain facilities, including the following:        Yes   Patient/family informed of bed offers received.  Patient chooses bed at  [facility] )     Physician recommends and patient chooses bed at      Patient to be transferred to  [facility] ) on 05/14/16.  Patient to be transferred to facility by  [facility] )     Patient family notified on 05/14/16 of transfer.  Name of family member notified:   (Patient's daughter Kristie Byrd is aware of D/C today. )     PHYSICIAN        Additional Comment:    _______________________________________________ Jonna Dittrich, Veronia Beets, LCSW 05/14/2016, 12:28 PM

## 2016-05-14 NOTE — Progress Notes (Signed)
Patient is medically stable for D/C to [facility] today. Per Seth Bake admissions coordinator at [facility] patient can come today to room 326. RN will call report at [phone] and arrange EMS for transport. Health [employer] authorization has been received. Auth # [id number]. Clinical Education officer, museum (CSW) sent D/C orders to [employer] via [employer]. Patient is aware of above. CSW contacted patient's daughter Margreta Journey and made her aware of above. Please reconsult if future social work needs arise. CSW signing off.   [employer], LCSW [phone]

## 2016-05-18 ENCOUNTER — Encounter: Payer: Self-pay | Admitting: Orthopedic Surgery

## 2016-05-18 ENCOUNTER — Other Ambulatory Visit: Payer: Self-pay | Admitting: *Deleted

## 2016-05-18 DIAGNOSIS — F39 Unspecified mood [affective] disorder: Secondary | ICD-10-CM

## 2016-05-18 DIAGNOSIS — M1712 Unilateral primary osteoarthritis, left knee: Secondary | ICD-10-CM | POA: Diagnosis not present

## 2016-05-18 DIAGNOSIS — H43819 Vitreous degeneration, unspecified eye: Secondary | ICD-10-CM | POA: Diagnosis not present

## 2016-05-18 DIAGNOSIS — K219 Gastro-esophageal reflux disease without esophagitis: Secondary | ICD-10-CM | POA: Diagnosis not present

## 2016-05-18 DIAGNOSIS — I10 Essential (primary) hypertension: Secondary | ICD-10-CM | POA: Diagnosis not present

## 2016-05-18 NOTE — Patient Outreach (Signed)
[facility]) Care Management  05/18/2016  Kristie Byrd [birth year] [id number]  EMMI-discharge referral:   Per chart review patient was hospitalized from 12/27-12/29/2017 for total right knee arthroplasty.  Patient was discharged to skilled facility for rehabilitation.   Telephone call to [facility] facility.  Spoke with Enrigue Catena at [facility] who verified that patient was at currently at facility receiving skilled/rehabilitation services.   Plan: Close case. Advise care management assistant to have EMMI-calls stopped.   Sherrin Daisy, RN BSN [place] Management Coordinator [facility] Care Management  [phone]

## 2016-06-03 ENCOUNTER — Ambulatory Visit: Payer: PPO | Admitting: Family Medicine

## 2016-06-04 ENCOUNTER — Telehealth: Payer: Self-pay | Admitting: *Deleted

## 2016-06-04 NOTE — Telephone Encounter (Signed)
Called the patient and figured out that I think Dr Silvio Pate faxed a Home PT order directly to [facility] without putting Referral into Epic. [facility] said they couldn't accept the patient because they dont have the staff. I explained to the patient that you would need to put a Referral into Epic on Monday so we could call another [facility] agency to accept her . She is frustrated because [facility] told her he would not place another referral because she is no longer home bound and she should go to the hospital. She said forget it , I done want the referral any longer.

## 2016-06-04 NOTE — Telephone Encounter (Signed)
Pt left vm stating she had a referral for PT through [facility]. Pt states she received a call indicating they were short staffed and would be unable to come to pts home. Pt states she is unable to drive and is wanting to know who will now be coming to her location to complete PT. pls advise

## 2016-06-05 NOTE — Telephone Encounter (Signed)
She was in rehab at [facility] and the referral was made by Felipe Drone, the social worker there. Please contact her---she should be able to make a referral somewhere else

## 2016-06-06 ENCOUNTER — Other Ambulatory Visit: Payer: Self-pay | Admitting: Family Medicine

## 2016-06-08 NOTE — Telephone Encounter (Signed)
Called SW Crystal at [employer]. She got a message that [facility] passed along the referral for [facility] to [facility]. Called the patient and she decided to arrange rides to [facility] for Outpatient PT which is ordered by [facility]. She told [facility] as she was goingt to go to Outpatient now.

## 2016-06-09 DIAGNOSIS — M6281 Muscle weakness (generalized): Secondary | ICD-10-CM | POA: Diagnosis not present

## 2016-06-09 DIAGNOSIS — M25661 Stiffness of right knee, not elsewhere classified: Secondary | ICD-10-CM | POA: Diagnosis not present

## 2016-06-09 DIAGNOSIS — M25561 Pain in right knee: Secondary | ICD-10-CM | POA: Diagnosis not present

## 2016-06-09 DIAGNOSIS — Z96651 Presence of right artificial knee joint: Secondary | ICD-10-CM | POA: Diagnosis not present

## 2016-06-14 DIAGNOSIS — Z96651 Presence of right artificial knee joint: Secondary | ICD-10-CM | POA: Diagnosis not present

## 2016-06-16 ENCOUNTER — Encounter: Payer: Self-pay | Admitting: Family Medicine

## 2016-06-16 ENCOUNTER — Ambulatory Visit (INDEPENDENT_AMBULATORY_CARE_PROVIDER_SITE_OTHER): Payer: PPO | Admitting: Family Medicine

## 2016-06-16 VITALS — BP 138/64 | HR 95 | Temp 98.0°F | Wt 223.5 lb

## 2016-06-16 DIAGNOSIS — Z96651 Presence of right artificial knee joint: Secondary | ICD-10-CM | POA: Diagnosis not present

## 2016-06-16 DIAGNOSIS — F419 Anxiety disorder, unspecified: Secondary | ICD-10-CM | POA: Diagnosis not present

## 2016-06-16 DIAGNOSIS — F32A Depression, unspecified: Secondary | ICD-10-CM | POA: Insufficient documentation

## 2016-06-16 DIAGNOSIS — F411 Generalized anxiety disorder: Secondary | ICD-10-CM | POA: Insufficient documentation

## 2016-06-16 MED ORDER — SERTRALINE HCL 50 MG PO TABS: 50.0000 mg | ORAL_TABLET | Freq: Every day | ORAL | 3 refills | Status: DC

## 2016-06-16 NOTE — Assessment & Plan Note (Signed)
Deteriorated. >25 minutes spent in face to face time with patient, >50% spent in counselling or coordination of care Start Zoloft 50 mg. Patient is to take 1/2 tablet daily for 10 days, then advance to 1 full tablet thereafter. We discussed possible side effects of headache, GI upset, drowsiness, and SI/HI. If thoughts of SI/HI develop, we discussed to present to the emergency immediately. Patient verbalized understanding.    

## 2016-06-16 NOTE — Progress Notes (Signed)
Pre visit review using our clinic review tool, if applicable. No additional management support is needed unless otherwise documented below in the visit note. 

## 2016-06-16 NOTE — Assessment & Plan Note (Signed)
Doing well.  Has appt with ortho next week.

## 2016-06-16 NOTE — Progress Notes (Signed)
Subjective:   Patient ID: Kristie Byrd, female    DOB: [birth year], 71 y.o.   MRN: [id number]  Kristie Byrd is a pleasant 71 y.o. year old female who presents to clinic today with her daughter for Follow-up [facility] for R knee replacement)  on 06/16/2016  HPI:  S/p Right TKA on 05/12/16.  Went to [facility] for rehab afterwards. Discharged on 05/28/2016.  Notes reviewed.  Has follow up scheduled with Dr. Marry Guan on 06/22/16.  From a pain and mobility stand point, she is doing very well.  She has only had to take two tramadol since she was discharged.  She does feel that her anxiety is worse.Wellbutrin has been very effective for depression but since her knee replacement, she has been very anxious. Worried about things that "shouldn't matter."   Current Outpatient Prescriptions on File Prior to Visit  Medication Sig Dispense Refill  . buPROPion (WELLBUTRIN XL) 300 MG 24 hr tablet take 1 tablet by mouth once daily 30 tablet 0  . Carboxymethylcellulose Sodium (THERATEARS OP) Apply 1 drop to eye daily as needed (dry eyes).    . Cholecalciferol (VITAMIN D3) 1000 units CAPS Take 1,000 Units by mouth daily.    . cyanocobalamin (,VITAMIN B-12,) 1000 MCG/ML injection inject 1 milliliter intramuscularly every month 1 mL 6  . ferrous sulfate 325 (65 FE) MG tablet take 1 tablet by mouth once daily with BREAKFAST 30 tablet 0  . lisinopril-hydrochlorothiazide (PRINZIDE,ZESTORETIC) 10-12.5 MG tablet take 1 tablet by mouth once daily (Patient taking differently: take 1 tablet by mouth once daily at night) 90 tablet 2  . meloxicam (MOBIC) 15 MG tablet take 1 tablet by mouth once daily 30 tablet 0  . Multiple Vitamin (MULTIVITAMIN) tablet Take 1 tablet by mouth at bedtime.     . ranitidine (ZANTAC) 150 MG tablet Take 150 mg by mouth daily as needed for heartburn.    . simethicone (MYLICON) 0000000 MG chewable tablet Chew 125 mg by mouth every 6 (six) hours as needed for flatulence.    . traMADol  (ULTRAM) 50 MG tablet Take 1-2 tablets (50-100 mg total) by mouth every 4 (four) hours as needed for moderate pain. 30 tablet 0   No current facility-administered medications on file prior to visit.     Allergies  Allergen Reactions  . Other Other (See Comments)    Band-Aid, blister  . Codeine Nausea And Vomiting  . Demerol [Meperidine] Nausea And Vomiting  . Fleet Phospho-Soda [Sodium Phosphates]     Vomiting   . Prednisone Other (See Comments)    Chest pressure    Past Medical History:  Diagnosis Date  . Arthritis   . Cancer ([place])    cervix, skin  . Colon polyps   . COPD (chronic obstructive pulmonary disease) ([place])   . Depression   . GERD (gastroesophageal reflux disease)   . Headache   . Hypertension   . Iron deficiency anemia 09/27/2012  . PONV (postoperative nausea and vomiting)   . Shingles     Past Surgical History:  Procedure Laterality Date  . BREAST BIOPSY Bilateral   . CARPAL TUNNEL RELEASE Bilateral   . COLONOSCOPY  multiple  . KNEE ARTHROPLASTY Right 05/12/2016   Procedure: COMPUTER ASSISTED TOTAL KNEE ARTHROPLASTY;  Surgeon: Dereck Leep, MD;  Location: [facility] ORS;  Service: Orthopedics;  Laterality: Right;  . KNEE SURGERY Right   . NOSE SURGERY    . TONSILLECTOMY      No family history  on file.  Social History   Social History  . Marital status: Single    Spouse name: N/A  . Number of children: 2  . Years of education: N/A   Occupational History  . retired    Social History Main Topics  . Smoking status: Former Smoker    Packs/day: 1.00    Years: 37.00    Types: Cigarettes    Quit date: 11/15/1996  . Smokeless tobacco: Never Used  . Alcohol use No  . Drug use: No  . Sexual activity: Not on file   Other Topics Concern  . Not on file   Social History Narrative   Lives alone.  Divorced. Two daughters- in 29s, 3 grandchildren.   Does not know family history- raised in [facility].        Desires CPR.  Does not have HPOA.    She would possibly want life support if for short period of time . Unsure about feeding tube.   The PMH, PSH, Social History, Family History, Medications, and allergies have been reviewed in [facility], and have been updated if relevant.  Review of Systems  Constitutional: Negative.   Musculoskeletal: Negative for arthralgias.  Neurological: Negative.   Psychiatric/Behavioral: Positive for decreased concentration. Negative for agitation, behavioral problems, confusion, dysphoric mood, hallucinations, self-injury, sleep disturbance and suicidal ideas. The patient is nervous/anxious. The patient is not hyperactive.   All other systems reviewed and are negative.      Objective:    BP 138/64   Pulse 95   Temp 98 F (36.7 C) (Oral)   Wt 223 lb 8 oz (101.4 kg)   SpO2 96%   BMI 38.36 kg/m    Physical Exam  Constitutional: She is oriented to person, place, and time. She appears well-developed and well-nourished. No distress.  HENT:  Head: Normocephalic.  Eyes: Conjunctivae are normal.  Cardiovascular: Normal rate.   Pulmonary/Chest: Effort normal.  Musculoskeletal:  Right knee- well healed surgical scar, good ROM, no effusion or tenderness  Neurological: She is alert and oriented to person, place, and time. No cranial nerve deficit.  Skin: Skin is warm and dry. She is not diaphoretic.  Psychiatric: She has a normal mood and affect. Her behavior is normal. Judgment and thought content normal.  Nursing note and vitals reviewed.         Assessment & Plan:   Status post total right knee replacement  Anxiety No Follow-up on file.

## 2016-06-16 NOTE — Patient Instructions (Signed)
Great to see you.  We are starting zoloft 50 mg daily.  Please cut in half and take 25 mg daily for the first week, then take a full tablet daily.  Keep me updated.

## 2016-06-18 DIAGNOSIS — Z96651 Presence of right artificial knee joint: Secondary | ICD-10-CM | POA: Diagnosis not present

## 2016-06-22 DIAGNOSIS — Z96651 Presence of right artificial knee joint: Secondary | ICD-10-CM | POA: Diagnosis not present

## 2016-06-24 ENCOUNTER — Encounter: Payer: Self-pay | Admitting: Family Medicine

## 2016-06-24 ENCOUNTER — Other Ambulatory Visit: Payer: Self-pay | Admitting: Family Medicine

## 2016-06-24 MED ORDER — LISINOPRIL-HYDROCHLOROTHIAZIDE 10-12.5 MG PO TABS: 1.0000 | ORAL_TABLET | Freq: Every day | ORAL | 0 refills | Status: DC

## 2016-06-24 MED ORDER — MELOXICAM 15 MG PO TABS: 15.0000 mg | ORAL_TABLET | Freq: Every day | ORAL | 0 refills | Status: DC

## 2016-06-25 DIAGNOSIS — M25661 Stiffness of right knee, not elsewhere classified: Secondary | ICD-10-CM | POA: Diagnosis not present

## 2016-06-25 DIAGNOSIS — Z96651 Presence of right artificial knee joint: Secondary | ICD-10-CM | POA: Diagnosis not present

## 2016-06-25 DIAGNOSIS — M25561 Pain in right knee: Secondary | ICD-10-CM | POA: Diagnosis not present

## 2016-06-25 DIAGNOSIS — M6281 Muscle weakness (generalized): Secondary | ICD-10-CM | POA: Diagnosis not present

## 2016-06-28 DIAGNOSIS — Z96651 Presence of right artificial knee joint: Secondary | ICD-10-CM | POA: Insufficient documentation

## 2016-07-02 ENCOUNTER — Other Ambulatory Visit: Payer: Self-pay | Admitting: Family Medicine

## 2016-07-02 DIAGNOSIS — M6281 Muscle weakness (generalized): Secondary | ICD-10-CM | POA: Diagnosis not present

## 2016-07-02 DIAGNOSIS — M25561 Pain in right knee: Secondary | ICD-10-CM | POA: Diagnosis not present

## 2016-07-02 DIAGNOSIS — I10 Essential (primary) hypertension: Secondary | ICD-10-CM

## 2016-07-02 DIAGNOSIS — Z01419 Encounter for gynecological examination (general) (routine) without abnormal findings: Secondary | ICD-10-CM

## 2016-07-02 DIAGNOSIS — D509 Iron deficiency anemia, unspecified: Secondary | ICD-10-CM

## 2016-07-02 DIAGNOSIS — Z96651 Presence of right artificial knee joint: Secondary | ICD-10-CM | POA: Diagnosis not present

## 2016-07-05 ENCOUNTER — Ambulatory Visit (INDEPENDENT_AMBULATORY_CARE_PROVIDER_SITE_OTHER): Payer: PPO

## 2016-07-05 VITALS — BP 118/78 | HR 76 | Temp 98.0°F | Ht 63.75 in | Wt 218.2 lb

## 2016-07-05 DIAGNOSIS — Z0000 Encounter for general adult medical examination without abnormal findings: Secondary | ICD-10-CM

## 2016-07-05 DIAGNOSIS — Z01419 Encounter for gynecological examination (general) (routine) without abnormal findings: Secondary | ICD-10-CM

## 2016-07-05 LAB — CBC WITH DIFFERENTIAL/PLATELET
Basophils Absolute: 0 10*3/uL (ref 0.0–0.1)
Basophils Relative: 0.9 % (ref 0.0–3.0)
EOS ABS: 0.1 10*3/uL (ref 0.0–0.7)
Eosinophils Relative: 3.6 % (ref 0.0–5.0)
HCT: 33 % — ABNORMAL LOW (ref 36.0–46.0)
Hemoglobin: 10.8 g/dL — ABNORMAL LOW (ref 12.0–15.0)
LYMPHS ABS: 0.7 10*3/uL (ref 0.7–4.0)
LYMPHS PCT: 18.6 % (ref 12.0–46.0)
MCHC: 32.8 g/dL (ref 30.0–36.0)
MCV: 86.3 fl (ref 78.0–100.0)
Monocytes Absolute: 0.4 10*3/uL (ref 0.1–1.0)
Monocytes Relative: 9.1 % (ref 3.0–12.0)
NEUTROS ABS: 2.7 10*3/uL (ref 1.4–7.7)
NEUTROS PCT: 67.8 % (ref 43.0–77.0)
PLATELETS: 297 10*3/uL (ref 150.0–400.0)
RBC: 3.82 Mil/uL — ABNORMAL LOW (ref 3.87–5.11)
RDW: 14.6 % (ref 11.5–15.5)
WBC: 4 10*3/uL (ref 4.0–10.5)

## 2016-07-05 LAB — LIPID PANEL
CHOL/HDL RATIO: 3
Cholesterol: 155 mg/dL (ref 0–200)
HDL: 50.9 mg/dL (ref >39.00)
LDL Cholesterol: 79 mg/dL (ref 0–99)
NONHDL: 104.27
Triglycerides: 125 mg/dL (ref 0.0–149.0)
VLDL: 25 mg/dL (ref 0.0–40.0)

## 2016-07-05 LAB — COMPREHENSIVE METABOLIC PANEL
ALT: 12 U/L (ref 0–35)
AST: 15 U/L (ref 0–37)
Albumin: 4.2 g/dL (ref 3.5–5.2)
Alkaline Phosphatase: 83 U/L (ref 39–117)
BILIRUBIN TOTAL: 0.4 mg/dL (ref 0.2–1.2)
BUN: 26 mg/dL — ABNORMAL HIGH (ref 6–23)
CO2: 29 mEq/L (ref 19–32)
CREATININE: 1.43 mg/dL — AB (ref 0.40–1.20)
Calcium: 9.8 mg/dL (ref 8.4–10.5)
Chloride: 104 mEq/L (ref 96–112)
GFR: 38.59 mL/min — ABNORMAL LOW (ref >60.00)
GLUCOSE: 92 mg/dL (ref 70–99)
Potassium: 4.3 mEq/L (ref 3.5–5.1)
Sodium: 140 mEq/L (ref 135–145)
Total Protein: 7.1 g/dL (ref 6.0–8.3)

## 2016-07-05 LAB — TSH: TSH: 1.26 u[IU]/mL (ref 0.35–4.50)

## 2016-07-05 NOTE — Progress Notes (Signed)
PCP notes:   Health maintenance:  Bone density - pt declined due to cost Mammogram - pt needs exam prior to screening  Abnormal screenings:   None  Patient concerns:   Pt has requested a well woman exam with pap smear.  Nurse concerns:  Pt has reported urinary incontinence. Please see questionnaire responses below.   Urinary Incontinence Short Questionnaire 1. How often do you leak urine? About once a week or less often 2. How much urine do you normally leak (whether you wear protection or not)? A small amount 3. Overall, how much does leaking urine interfere with your everyday life?  Please circle a number between 0(not at all) and 10(a great deal). Patient's response: 0 4. When does urine leak?  Leaks when I cough or sneeze  Next PCP appt:   07/12/16 @ 1400

## 2016-07-05 NOTE — Patient Instructions (Signed)
Kristie Byrd , Thank you for taking time to come for your Medicare Wellness Visit. I appreciate your ongoing commitment to your health goals. Please review the following plan we discussed and let me know if I can assist you in the future.   These are the goals we discussed: Goals    . Increase physical activity          Starting 07/05/2016, I will continue to exercise at least 60 min 2 days per week.        This is a list of the screening recommended for you and due dates:  Health Maintenance  Topic Date Due  . DEXA scan (bone density measurement)  07/05/2017*  . Mammogram  11/13/2017*  . Colon Cancer Screening  02/07/2020  . Tetanus Vaccine  02/19/2025  . Flu Shot  Completed  . Shingles Vaccine  Completed  .  Hepatitis C: One time screening is recommended by Center for Disease Control  (CDC) for  adults born from [birth year] through 1964.   Completed  . Pneumonia vaccines  Completed  *Topic was postponed. The date shown is not the original due date.   Preventive Care for Adults  A healthy lifestyle and preventive care can promote health and wellness. Preventive health guidelines for adults include the following key practices.  . A routine yearly physical is a good way to check with your health care provider about your health and preventive screening. It is a chance to share any concerns and updates on your health and to receive a thorough exam.  . Visit your dentist for a routine exam and preventive care every 6 months. Brush your teeth twice a day and floss once a day. Good oral hygiene prevents tooth decay and gum disease.  . The frequency of eye exams is based on your age, health, family medical history, use  of contact lenses, and other factors. Follow your health care provider's ecommendations for frequency of eye exams.  . Eat a healthy diet. Foods like vegetables, fruits, whole grains, low-fat dairy products, and lean protein foods contain the nutrients you need without too many  calories. Decrease your intake of foods high in solid fats, added sugars, and salt. Eat the right amount of calories for you. Get information about a proper diet from your health care provider, if necessary.  . Regular physical exercise is one of the most important things you can do for your health. Most adults should get at least 150 minutes of moderate-intensity exercise (any activity that increases your heart rate and causes you to sweat) each week. In addition, most adults need muscle-strengthening exercises on 2 or more days a week.  Silver Sneakers may be a benefit available to you. To determine eligibility, you may visit the website: www.silversneakers.com or contact program at 215-281-2036 Mon-Fri between 8AM-8PM.   . Maintain a healthy weight. The body mass index (BMI) is a screening tool to identify possible weight problems. It provides an estimate of body fat based on height and weight. Your health care provider can find your BMI and can help you achieve or maintain a healthy weight.   For adults 20 years and older: ? A BMI below 18.5 is considered underweight. ? A BMI of 18.5 to 24.9 is normal. ? A BMI of 25 to 29.9 is considered overweight. ? A BMI of 30 and above is considered obese.   . Maintain normal blood lipids and cholesterol levels by exercising and minimizing your intake of saturated fat. Eat a  balanced diet with plenty of fruit and vegetables. Blood tests for lipids and cholesterol should begin at age 44 and be repeated every 5 years. If your lipid or cholesterol levels are high, you are over 50, or you are at high risk for heart disease, you may need your cholesterol levels checked more frequently. Ongoing high lipid and cholesterol levels should be treated with medicines if diet and exercise are not working.  . If you smoke, find out from your health care provider how to quit. If you do not use tobacco, please do not start.  . If you choose to drink alcohol, please do  not consume more than 2 drinks per day. One drink is considered to be 12 ounces (355 mL) of beer, 5 ounces (148 mL) of wine, or 1.5 ounces (44 mL) of liquor.  . If you are 29-15 years old, ask your health care provider if you should take aspirin to prevent strokes.  . Use sunscreen. Apply sunscreen liberally and repeatedly throughout the day. You should seek shade when your shadow is shorter than you. Protect yourself by wearing long sleeves, pants, a wide-brimmed hat, and sunglasses year round, whenever you are outdoors.  . Once a month, do a whole body skin exam, using a mirror to look at the skin on your back. Tell your health care provider of new moles, moles that have irregular borders, moles that are larger than a pencil eraser, or moles that have changed in shape or color.

## 2016-07-05 NOTE — Progress Notes (Signed)
Subjective:   Kristie Byrd is a 71 y.o. female who presents for Medicare Annual (Subsequent) preventive examination.  Review of Systems:  N/A Cardiac Risk Factors include: advanced age (>50men, >70 women);obesity (BMI >30kg/m2);hypertension     Objective:     Vitals: BP 118/78 (BP Location: Right Arm, Patient Position: Sitting, Cuff Size: Normal)   Pulse 76   Temp 98 F (36.7 C) (Oral)   Ht 5' 3.75" (1.619 m) Comment: no shoes  Wt 218 lb 4 oz (99 kg)   SpO2 98%   BMI 37.76 kg/m   Body mass index is 37.76 kg/m.   Tobacco History  Smoking Status  . Former Smoker  . Packs/day: 1.00  . Years: 37.00  . Types: Cigarettes  . Quit date: 11/15/1996  Smokeless Tobacco  . Never Used     Counseling given: No   Past Medical History:  Diagnosis Date  . Arthritis   . Cancer ([place])    cervix, skin  . Colon polyps   . COPD (chronic obstructive pulmonary disease) ([place])   . Depression   . GERD (gastroesophageal reflux disease)   . Headache   . Hypertension   . Iron deficiency anemia 09/27/2012  . PONV (postoperative nausea and vomiting)   . Shingles    Past Surgical History:  Procedure Laterality Date  . BREAST BIOPSY Bilateral   . CARPAL TUNNEL RELEASE Bilateral   . COLONOSCOPY  multiple  . KNEE ARTHROPLASTY Right 05/12/2016   Procedure: COMPUTER ASSISTED TOTAL KNEE ARTHROPLASTY;  Surgeon: Dereck Leep, MD;  Location: [facility] ORS;  Service: Orthopedics;  Laterality: Right;  . KNEE SURGERY Right   . NOSE SURGERY    . TONSILLECTOMY     History reviewed. No pertinent family history. History  Sexual Activity  . Sexual activity: No    Outpatient Encounter Prescriptions as of 07/05/2016  Medication Sig  . buPROPion (WELLBUTRIN XL) 300 MG 24 hr tablet take 1 tablet by mouth once daily  . Carboxymethylcellulose Sodium (THERATEARS OP) Apply 1 drop to eye daily as needed (dry eyes).  . Cholecalciferol (VITAMIN D3) 1000 units CAPS Take 1,000 Units by mouth daily.  .  cyanocobalamin (,VITAMIN B-12,) 1000 MCG/ML injection inject 1 milliliter intramuscularly every month  . ferrous sulfate 325 (65 FE) MG tablet take 1 tablet by mouth once daily with BREAKFAST  . lisinopril-hydrochlorothiazide (PRINZIDE,ZESTORETIC) 10-12.5 MG tablet Take 1 tablet by mouth daily.  . meloxicam (MOBIC) 15 MG tablet Take 1 tablet (15 mg total) by mouth daily.  . Multiple Vitamin (MULTIVITAMIN) tablet Take 1 tablet by mouth at bedtime.   . ranitidine (ZANTAC) 150 MG tablet Take 150 mg by mouth daily as needed for heartburn.  . sertraline (ZOLOFT) 50 MG tablet Take 1 tablet (50 mg total) by mouth daily.  . simethicone (MYLICON) 0000000 MG chewable tablet Chew 125 mg by mouth every 6 (six) hours as needed for flatulence.  . traMADol (ULTRAM) 50 MG tablet Take 1-2 tablets (50-100 mg total) by mouth every 4 (four) hours as needed for moderate pain.   No facility-administered encounter medications on file as of 07/05/2016.     Activities of Daily Living In your present state of health, do you have any difficulty performing the following activities: 07/05/2016 05/12/2016  Hearing? N N  Vision? N N  Difficulty concentrating or making decisions? N N  Walking or climbing stairs? N Y  Dressing or bathing? N N  Doing errands, shopping? N N  Preparing Food and eating ?  N -  Using the Toilet? N -  In the past six months, have you accidently leaked urine? Y -  Do you have problems with loss of bowel control? N -  Managing your Medications? N -  Managing your Finances? N -  Housekeeping or managing your Housekeeping? N -  Some recent data might be hidden    Patient Care Team: Lucille Passy, MD as PCP - General (Family Medicine) Juanito Doom, MD as Consulting Physician (Pulmonary Disease) Forest Gleason, MD as Consulting Physician (Oncology) Gatha Mayer, MD as Consulting Physician (Gastroenterology) Leandrew Koyanagi, MD as Referring Physician (Ophthalmology)    Assessment:      Hearing Screening Comments: Last hearing exam in 2017 with [facility]. Hearing aid recommended for right ear. Pt declined due to cost.  Vision Screening Comments: Last vision exam in 2017 with Dr. Wallace Going  Exercise Activities and Dietary recommendations Current Exercise Habits: Home exercise routine, Type of exercise: stretching;Other - see comments (rehab exercises), Time (Minutes): 60, Frequency (Times/Week): 2, Weekly Exercise (Minutes/Week): 120, Intensity: Mild, Exercise limited by: None identified  Goals    . Increase physical activity          Starting 07/05/2016, I will continue to exercise at least 60 min 2 days per week.       Fall Risk Fall Risk  07/05/2016 05/27/2015 09/27/2012  Falls in the past year? No Yes Yes  Number falls in past yr: - 1 1  Injury with Fall? - No No   Depression Screen PHQ 2/9 Scores 07/05/2016 05/27/2015 09/27/2012  PHQ - 2 Score 0 0 2     Cognitive Function MMSE - Mini Mental State Exam 07/05/2016  Orientation to time 5  Orientation to Place 5  Registration 3  Attention/ Calculation 0  Recall 3  Language- name 2 objects 0  Language- repeat 1  Language- follow 3 step command 3  Language- read & follow direction 0  Write a sentence 0  Copy design 0  Total score 20     PLEASE NOTE: A Mini-Cog screen was completed. Maximum score is 20. A value of 0 denotes this part of [place] MMSE was not completed or the patient failed this part of the Mini-Cog screening.   Mini-Cog Screening Orientation to Time - Max 5 pts Orientation to Place - Max 5 pts Registration - Max 3 pts Recall - Max 3 pts Language Repeat - Max 1 pts Language Follow 3 Step Command - Max 3 pts     Immunization History  Administered Date(s) Administered  . Influenza Split 02/15/2013  . Influenza, High Dose Seasonal PF 02/02/2014, 01/21/2015  . Influenza, Seasonal, Injecte, Preservative Fre 01/02/2016  . Pneumococcal Conjugate-13 02/02/2014  . Pneumococcal-Unspecified  02/19/2013  . Td 11/05/2013  . Tdap 02/20/2015  . Zoster 12/27/2012   Screening Tests Health Maintenance  Topic Date Due  . DEXA SCAN  07/05/2017 (Originally 10/10/2011)  . MAMMOGRAM  11/13/2017 (Originally 11/15/2015)  . COLONOSCOPY  02/07/2020  . TETANUS/TDAP  02/19/2025  . INFLUENZA VACCINE  Completed  . ZOSTAVAX  Completed  . Hepatitis C Screening  Completed  . PNA vac Low Risk Adult  Completed      Plan:     I have personally reviewed and addressed the Medicare Annual Wellness questionnaire and have noted the following in the patient's chart:  A. Medical and social history B. Use of alcohol, tobacco or illicit drugs  C. Current medications and supplements D. Functional ability and status  E.  Nutritional status F.  Physical activity G. Advance directives H. List of other physicians I.  Hospitalizations, surgeries, and ER visits in previous 12 months J.  [place] to include hearing, vision, cognitive, depression L. Referrals and appointments - none  In addition, I have reviewed and discussed with patient certain preventive protocols, quality metrics, and best practice recommendations. A written personalized care plan for preventive services as well as general preventive health recommendations were provided to patient.  See attached scanned questionnaire for additional information.   Signed,   Lindell Noe, MHA, BS, LPN Health Coach

## 2016-07-05 NOTE — Progress Notes (Signed)
Pre visit review using our clinic review tool, if applicable. No additional management support is needed unless otherwise documented below in the visit note. 

## 2016-07-05 NOTE — Progress Notes (Signed)
I reviewed health advisor's note, was available for consultation, and agree with documentation and plan.  

## 2016-07-06 ENCOUNTER — Other Ambulatory Visit: Payer: PPO

## 2016-07-09 ENCOUNTER — Other Ambulatory Visit: Payer: Self-pay | Admitting: Family Medicine

## 2016-07-12 ENCOUNTER — Encounter: Payer: PPO | Admitting: Family Medicine

## 2016-07-14 ENCOUNTER — Encounter: Payer: Self-pay | Admitting: Family Medicine

## 2016-07-14 ENCOUNTER — Other Ambulatory Visit (HOSPITAL_COMMUNITY)
Admission: RE | Admit: 2016-07-14 | Discharge: 2016-07-14 | Disposition: A | Discharge status: Home / Self Care | Payer: PPO | Source: Ambulatory Visit | Attending: Family Medicine | Admitting: Family Medicine

## 2016-07-14 ENCOUNTER — Ambulatory Visit (INDEPENDENT_AMBULATORY_CARE_PROVIDER_SITE_OTHER): Payer: PPO | Admitting: Family Medicine

## 2016-07-14 VITALS — BP 100/62 | HR 94 | Ht 63.5 in | Wt 216.0 lb

## 2016-07-14 DIAGNOSIS — Z1151 Encounter for screening for human papillomavirus (HPV): Secondary | ICD-10-CM | POA: Insufficient documentation

## 2016-07-14 DIAGNOSIS — Z124 Encounter for screening for malignant neoplasm of cervix: Secondary | ICD-10-CM

## 2016-07-14 DIAGNOSIS — N289 Disorder of kidney and ureter, unspecified: Secondary | ICD-10-CM | POA: Insufficient documentation

## 2016-07-14 DIAGNOSIS — Z01419 Encounter for gynecological examination (general) (routine) without abnormal findings: Secondary | ICD-10-CM | POA: Diagnosis not present

## 2016-07-14 DIAGNOSIS — D509 Iron deficiency anemia, unspecified: Secondary | ICD-10-CM | POA: Diagnosis not present

## 2016-07-14 DIAGNOSIS — K219 Gastro-esophageal reflux disease without esophagitis: Secondary | ICD-10-CM

## 2016-07-14 DIAGNOSIS — F320 Major depressive disorder, single episode, mild: Secondary | ICD-10-CM | POA: Diagnosis not present

## 2016-07-14 DIAGNOSIS — I10 Essential (primary) hypertension: Secondary | ICD-10-CM

## 2016-07-14 DIAGNOSIS — Z96651 Presence of right artificial knee joint: Secondary | ICD-10-CM | POA: Diagnosis not present

## 2016-07-14 LAB — BASIC METABOLIC PANEL
BUN: 33 mg/dL — ABNORMAL HIGH (ref 6–23)
CHLORIDE: 101 meq/L (ref 96–112)
CO2: 27 meq/L (ref 19–32)
Calcium: 9.8 mg/dL (ref 8.4–10.5)
Creatinine, Ser: 1.56 mg/dL — ABNORMAL HIGH (ref 0.40–1.20)
GFR: 34.9 mL/min — AB (ref >60.00)
GLUCOSE: 101 mg/dL — AB (ref 70–99)
POTASSIUM: 4.1 meq/L (ref 3.5–5.1)
SODIUM: 135 meq/L (ref 135–145)

## 2016-07-14 NOTE — Assessment & Plan Note (Signed)
Stable

## 2016-07-14 NOTE — Addendum Note (Signed)
Addended by: Inocencio Homes on: 07/14/2016 01:22 PM   Modules accepted: Orders

## 2016-07-14 NOTE — Assessment & Plan Note (Signed)
Well controlled on current rxs. No changes made. 

## 2016-07-14 NOTE — Progress Notes (Addendum)
Subjective:   Patient ID: Kristie Byrd, female    DOB: 10/26/1945, 71 y.o.   MRN: [id number]  Kristie Byrd is a pleasant 71 y.o. year old female who presents to clinic today with Annual Exam  on 07/14/2016  HPI:  Medicare wellness visit with Candis Musa, RN on 07/05/16. Note reviewed.   Influenza vaccine 01/02/16 Pneumovax 02/19/13 prevnar 13 01/23/14 Tdap 02/20/15 Zoster 12/27/12 Colonoscopy 02/06/13 Mammogram 11/14/13  Renal insuffienciency-worsened. Lab Results  Component Value Date   CREATININE 1.43 (H) 07/05/2016   Admits to not drinking much water. Taking NSAIDs and recently sick with severe UTI.  COPD- symptoms have been well controlled with current inhalers.  Depression- Still taking Wellbutrin 300 mg XL daily, zoloft.. Denies any symptoms of anxiety or depression.  HTN- well controlled on current dose of lisinopril- HCTZ.  Lab Results  Component Value Date   CREATININE 1.43 (H) 07/05/2016   Lab Results  Component Value Date   CHOL 155 07/05/2016   HDL 50.90 07/05/2016   LDLCALC 79 07/05/2016   TRIG 125.0 07/05/2016   CHOLHDL 3 07/05/2016   Lab Results  Component Value Date   CREATININE 1.43 (H) 07/05/2016   Lab Results  Component Value Date   ALT 12 07/05/2016   AST 15 07/05/2016   ALKPHOS 83 07/05/2016   BILITOT 0.4 07/05/2016   Lab Results  Component Value Date   TSH 1.26 07/05/2016   Lab Results  Component Value Date   WBC 4.0 07/05/2016   HGB 10.8 (L) 07/05/2016   HCT 33.0 (L) 07/05/2016   MCV 86.3 07/05/2016   PLT 297.0 07/05/2016     Current Outpatient Prescriptions on File Prior to Visit  Medication Sig Dispense Refill  . buPROPion (WELLBUTRIN XL) 300 MG 24 hr tablet take 1 tablet by mouth once daily 30 tablet 0  . Carboxymethylcellulose Sodium (THERATEARS OP) Apply 1 drop to eye daily as needed (dry eyes).    . Cholecalciferol (VITAMIN D3) 1000 units CAPS Take 1,000 Units by mouth daily.    . cyanocobalamin (,VITAMIN  B-12,) 1000 MCG/ML injection inject 1 milliliter intramuscularly every month 1 mL 6  . ferrous sulfate 325 (65 FE) MG tablet take 1 tablet by mouth once daily with BREAKFAST 30 tablet 0  . lisinopril-hydrochlorothiazide (PRINZIDE,ZESTORETIC) 10-12.5 MG tablet Take 1 tablet by mouth daily. 30 tablet 0  . meloxicam (MOBIC) 15 MG tablet Take 1 tablet (15 mg total) by mouth daily. 30 tablet 0  . Multiple Vitamin (MULTIVITAMIN) tablet Take 1 tablet by mouth at bedtime.     . ranitidine (ZANTAC) 150 MG tablet Take 150 mg by mouth daily as needed for heartburn.    . sertraline (ZOLOFT) 50 MG tablet Take 1 tablet (50 mg total) by mouth daily. 30 tablet 3  . simethicone (MYLICON) 0000000 MG chewable tablet Chew 125 mg by mouth every 6 (six) hours as needed for flatulence.    . traMADol (ULTRAM) 50 MG tablet Take 1-2 tablets (50-100 mg total) by mouth every 4 (four) hours as needed for moderate pain. 30 tablet 0   No current facility-administered medications on file prior to visit.     Allergies  Allergen Reactions  . Other Other (See Comments)    Band-Aid, blister  . Codeine Nausea And Vomiting  . Demerol [Meperidine] Nausea And Vomiting  . Fleet Phospho-Soda [Sodium Phosphates]     Vomiting   . Prednisone Other (See Comments)    Chest pressure    Past Medical  History:  Diagnosis Date  . Arthritis   . Cancer ([place])    cervix, skin  . Colon polyps   . COPD (chronic obstructive pulmonary disease) ([place])   . Depression   . GERD (gastroesophageal reflux disease)   . Headache   . Hypertension   . Iron deficiency anemia 09/27/2012  . PONV (postoperative nausea and vomiting)   . Shingles     Past Surgical History:  Procedure Laterality Date  . BREAST BIOPSY Bilateral   . CARPAL TUNNEL RELEASE Bilateral   . COLONOSCOPY  multiple  . KNEE ARTHROPLASTY Right 05/12/2016   Procedure: COMPUTER ASSISTED TOTAL KNEE ARTHROPLASTY;  Surgeon: Dereck Leep, MD;  Location: [facility] ORS;  Service:  Orthopedics;  Laterality: Right;  . KNEE SURGERY Right   . NOSE SURGERY    . TONSILLECTOMY      No family history on file.  Social History   Social History  . Marital status: Single    Spouse name: N/A  . Number of children: 2  . Years of education: N/A   Occupational History  . retired    Social History Main Topics  . Smoking status: Former Smoker    Packs/day: 1.00    Years: 37.00    Types: Cigarettes    Quit date: 11/15/1996  . Smokeless tobacco: Never Used  . Alcohol use No  . Drug use: No  . Sexual activity: No   Other Topics Concern  . Not on file   Social History Narrative   Lives alone.  Divorced. Two daughters- in 71s, 3 grandchildren.   Does not know family history- raised in [facility].        Desires CPR.  Does not have HPOA.   She would possibly want life support if for short period of time . Unsure about feeding tube.   The PMH, PSH, Social History, Family History, Medications, and allergies have been reviewed in [facility], and have been updated if relevant.   Review of Systems     Objective:    BP 100/62   Pulse 94   Ht 5' 3.5" (1.613 m)   Wt 218 lb 12 oz (99.2 kg)   SpO2 97%   BMI 38.14 kg/m    Physical Exam   General:  Well-developed,well-nourished,in no acute distress; alert,appropriate and cooperative throughout examination Head:  normocephalic and atraumatic.   Eyes:  vision grossly intact, PERRL Ears:  R ear normal and L ear normal externally, TMs clear bilaterally Nose:  no external deformity.   Mouth:  good dentition.   Neck:  No deformities, masses, or tenderness noted. Breasts:  No mass, nodules, thickening, tenderness, bulging, retraction, inflamation, nipple discharge or skin changes noted.   Lungs:  Normal respiratory effort, chest expands symmetrically. Lungs are clear to auscultation, no crackles or wheezes. Heart:  Normal rate and regular rhythm. S1 and S2 normal without gallop, murmur, click, rub or other extra  sounds. Abdomen:  Bowel sounds positive,abdomen soft and non-tender without masses, organomegaly or hernias noted. Rectal:  no external abnormalities.   Genitalia:  Pelvic Exam:        External: normal female genitalia without lesions or masses        Vagina: normal without lesions or masses        Cervix: normal without lesions or masses        Adnexa: normal bimanual exam without masses or fullness        Uterus: normal by palpation  Pap smear: performed Msk:  No deformity or scoliosis noted of thoracic or lumbar spine.   Extremities:  No clubbing, cyanosis, edema, or deformity noted with normal full range of motion of all joints.   Neurologic:  alert & oriented X3 and gait normal.   Skin:  Intact without suspicious lesions or rashes Cervical Nodes:  No lymphadenopathy noted Axillary Nodes:  No palpable lymphadenopathy Psych:  Cognition and judgment appear intact. Alert and cooperative with normal attention span and concentration. No apparent delusions, illusions, hallucinations       Assessment & Plan:   Essential hypertension  Well woman exam  Status post total right knee replacement  Iron deficiency anemia, unspecified iron deficiency anemia type  Mild single current episode of major depressive disorder (HCC)  Gastroesophageal reflux disease, esophagitis presence not specified No Follow-up on file.

## 2016-07-14 NOTE — Addendum Note (Signed)
Addended by: Marchia Bond on: 07/14/2016 01:34 PM   Modules accepted: Orders

## 2016-07-14 NOTE — Assessment & Plan Note (Signed)
Reviewed preventive care protocols, scheduled due services, and updated immunizations Discussed nutrition, exercise, diet, and healthy lifestyle.  Discussed USPSTF recommendations of cervical cancer screening.  She is aware that pap smear is not indicated in her age group but she would like to have one today.

## 2016-07-14 NOTE — Patient Instructions (Signed)
Great to see you. I will call you with your results from today. 

## 2016-07-14 NOTE — Assessment & Plan Note (Signed)
Well controlled.  No changes made. 

## 2016-07-14 NOTE — Assessment & Plan Note (Signed)
Repeat BMET today.  

## 2016-07-15 ENCOUNTER — Other Ambulatory Visit: Payer: Self-pay | Admitting: Family Medicine

## 2016-07-15 DIAGNOSIS — N289 Disorder of kidney and ureter, unspecified: Secondary | ICD-10-CM

## 2016-07-16 ENCOUNTER — Telehealth: Payer: Self-pay | Admitting: Family Medicine

## 2016-07-16 ENCOUNTER — Other Ambulatory Visit: Payer: Self-pay | Admitting: Family Medicine

## 2016-07-16 LAB — CYTOLOGY - PAP
Diagnosis: NEGATIVE
HPV: NOT DETECTED

## 2016-07-16 MED ORDER — LISINOPRIL 5 MG PO TABS: 5.0000 mg | ORAL_TABLET | Freq: Every day | ORAL | 3 refills | Status: DC

## 2016-07-16 NOTE — Telephone Encounter (Signed)
Left VM for pt to return my call  

## 2016-07-16 NOTE — Telephone Encounter (Signed)
Left voicemail for pt to return my call.  Kidney function is worse.  Advised to HOLD mobic.  I have already referred her to nephrology.  Also may need to d/c HCTZ/lisinopril but will await renal input.  Advised to push fluids.  Rosaria Ferries, can we please make this an urgent referral? Thanks

## 2016-07-16 NOTE — Telephone Encounter (Signed)
Pt would like to talk to dr Deborra Medina medical assistant  about her lab work.  Patient is very concerned and would like a call back

## 2016-07-16 NOTE — Telephone Encounter (Signed)
Spoke to pt.  She will STOP taking mobic, push fluids, cut back on high protein diet.  Also d/c her current bp rx- lisinopril hCTZ 10-12.5.  Pt has lost 20 pounds and HCTZ could be worsening her renal insufficiency. Will send in rx for low dose ACEI.  Pt to pick it up from the pharmacy today.  She has appt with renal ([facility]) scheduled, per pt. The patient indicates understanding of these issues and agrees with the plan.

## 2016-07-16 NOTE — Telephone Encounter (Signed)
Patient returned Dr.Aron's call.  Please call patient back at [phone] after 1:30.

## 2016-07-21 NOTE — Telephone Encounter (Signed)
See other telephone encounter.

## 2016-08-27 DIAGNOSIS — I10 Essential (primary) hypertension: Secondary | ICD-10-CM | POA: Diagnosis not present

## 2016-08-27 DIAGNOSIS — D631 Anemia in chronic kidney disease: Secondary | ICD-10-CM | POA: Diagnosis not present

## 2016-08-27 DIAGNOSIS — N183 Chronic kidney disease, stage 3 (moderate): Secondary | ICD-10-CM | POA: Diagnosis not present

## 2016-08-28 ENCOUNTER — Other Ambulatory Visit: Payer: Self-pay | Admitting: Family Medicine

## 2016-09-13 ENCOUNTER — Other Ambulatory Visit: Payer: Self-pay | Admitting: Family Medicine

## 2016-09-14 DIAGNOSIS — N183 Chronic kidney disease, stage 3 (moderate): Secondary | ICD-10-CM | POA: Diagnosis not present

## 2016-10-08 DIAGNOSIS — I10 Essential (primary) hypertension: Secondary | ICD-10-CM | POA: Diagnosis not present

## 2016-10-08 DIAGNOSIS — N183 Chronic kidney disease, stage 3 (moderate): Secondary | ICD-10-CM | POA: Diagnosis not present

## 2016-10-08 DIAGNOSIS — E559 Vitamin D deficiency, unspecified: Secondary | ICD-10-CM | POA: Diagnosis not present

## 2016-10-08 DIAGNOSIS — D631 Anemia in chronic kidney disease: Secondary | ICD-10-CM | POA: Diagnosis not present

## 2016-10-08 LAB — BASIC METABOLIC PANEL
BUN: 21 (ref 4–21)
Creatinine: 1.1 (ref 0.5–1.1)
POTASSIUM: 4.4 (ref 3.4–5.3)

## 2016-11-30 ENCOUNTER — Encounter: Payer: Self-pay | Admitting: Dietician

## 2016-11-30 ENCOUNTER — Encounter: Payer: PPO | Attending: Family Medicine | Admitting: Dietician

## 2016-11-30 VITALS — Ht 64.0 in | Wt 203.0 lb

## 2016-11-30 DIAGNOSIS — E669 Obesity, unspecified: Secondary | ICD-10-CM | POA: Insufficient documentation

## 2016-11-30 DIAGNOSIS — N189 Chronic kidney disease, unspecified: Secondary | ICD-10-CM | POA: Diagnosis not present

## 2016-11-30 DIAGNOSIS — E6609 Other obesity due to excess calories: Secondary | ICD-10-CM

## 2016-11-30 DIAGNOSIS — Z6834 Body mass index (BMI) 34.0-34.9, adult: Secondary | ICD-10-CM

## 2016-11-30 DIAGNOSIS — Z713 Dietary counseling and surveillance: Secondary | ICD-10-CM | POA: Insufficient documentation

## 2016-11-30 DIAGNOSIS — I129 Hypertensive chronic kidney disease with stage 1 through stage 4 chronic kidney disease, or unspecified chronic kidney disease: Secondary | ICD-10-CM | POA: Insufficient documentation

## 2016-11-30 NOTE — Patient Instructions (Signed)
Balance meals with 1-3 oz protein, 2-3 servings of carbohydrates (starches, fruit) and non-starchy vegetables. Refer to Planning a Balanced meal handout. Limit protein grams to 60-65 gms per day. (Protein finder) Limit sodium to 1500mg  per day Phosphorus goal- no more than 800-1000mg  per day. (Refer to phosphorus list) Limit whole grains due to phosphorus content.

## 2016-11-30 NOTE — Progress Notes (Signed)
Medical Nutrition Therapy: Visit start time: 1330  end time: 1430 Assessment:  Diagnosis: obesity, hypertension Past medical history: stage 3 kidney disease, anemia Psychosocial issues/ stress concerns: none identified Preferred learning method:  Nicki [place] . Hands-on  Current weight: 203 lbs Height: 64 in Medications, supplements:see list Progress and evaluation:  Patient in for initial medical nutrition therapy appointment. Her main concern is that she was recently told that she has Stage 3 kidney disease. Her recent labs indicate that her GFR has improved from 34.9 to 55 and her potassium and phosphorus are in a normal range. She reports she has lost approximately 35 lbs since her knee replacement surgery 04/2016 through diet changes and increased mobility. Her goal weight is 195 lbs.  She reports that her blood pressure is under control with medication. She has made significant diet changes but states, "I am almost afraid to eat". She is very motivated to do what she can to protect her kidneys. She is being over restrictive related to carbohydrates and is eating a very limited variety of foods presently.   Physical activity:walks daily for exercise  Dietary Intake:  Usual eating pattern includes 3 meals and 1-2  snacks per day. Dining out frequency: 1 meals per week.  Breakfast: egg, ww toast or English muffin, almond milk with stevia added Lunch: tuna with crackers, cherries, yogurt, SF peach tea Supper: baked chicken tenders, salad Snack: pistachios Beverages: decaf. Coffee, water (6-7 cups/day), SF peach tea  Nutrition Care Education: Weight control: Commended on patient's efforts and weight loss success. Use food guide plate and "Planning a Balanced Meal" handout to show how to better balance healthy carbohydrate foods with adequate but not excessive protein and non-starchy vegetables.  Hypertension:  importance of controlling BP especially related to kidney health, identifying  high sodium foods and suggesting lower sodium options. Kidney disease: Instructed on dietary guidelines for stage 3 kidney disease giving recommendations for sodium, protein, and phosphorus. Discussed meal options that meet guidelines.  Nutritional Diagnosis:  NB-1.1 Food and nutrition-related knowledge deficit As related to kidney disease.  As evidenced by diet history and fear of eating foods that are allowed..  Intervention: Balance meals with 1-3 oz protein, 2-3 servings of carbohydrates (starches, fruit) and non-starchy vegetables. Refer to Planning a Balanced meal handout. Limit protein grams to 60-65 gms per day. (Protein finder) Limit sodium to 1500mg  per day Phosphorus goal- no more than 800-1000mg  per day. (Refer to phosphorus list) Limit whole grains due to phosphorus content.  Education Materials given:  . Healthy Eating with Kidney disease . Food lists/ Planning A Balanced Meal . Protein Finder handout . Sample meal pattern/ menus . Goals/ instructions  Learner/ who was taught:  . Patient  Level of understanding: Marland Kitchen Verbalized understanding Demonstrated degree of understanding via:   Teach back Learning barriers: . None Willingness to learn/ readiness for change: . Eager, change in progress .   Monitoring and Evaluation:  Patient did not schedule a follow-up appointment. Encouraged her to call if she has questions or desires further help with her diet/nutrition.

## 2017-01-14 DIAGNOSIS — H2513 Age-related nuclear cataract, bilateral: Secondary | ICD-10-CM | POA: Diagnosis not present

## 2017-02-10 DIAGNOSIS — E559 Vitamin D deficiency, unspecified: Secondary | ICD-10-CM | POA: Diagnosis not present

## 2017-02-10 DIAGNOSIS — N183 Chronic kidney disease, stage 3 (moderate): Secondary | ICD-10-CM | POA: Diagnosis not present

## 2017-02-10 DIAGNOSIS — D631 Anemia in chronic kidney disease: Secondary | ICD-10-CM | POA: Diagnosis not present

## 2017-02-10 DIAGNOSIS — I10 Essential (primary) hypertension: Secondary | ICD-10-CM | POA: Diagnosis not present

## 2017-02-10 LAB — BASIC METABOLIC PANEL
BUN: 20 (ref 4–21)
CREATININE: 1.2 — AB (ref 0.5–1.1)
Potassium: 5 (ref 3.4–5.3)

## 2017-04-19 ENCOUNTER — Other Ambulatory Visit: Payer: Self-pay | Admitting: Family Medicine

## 2017-04-19 NOTE — Telephone Encounter (Signed)
TA-is refill appropriate? Last seen in Feb and unsure when you would like to see this patient/Plz advise/thx dmf

## 2017-05-02 ENCOUNTER — Ambulatory Visit: Payer: Self-pay

## 2017-05-02 ENCOUNTER — Telehealth: Payer: Self-pay | Admitting: Family Medicine

## 2017-05-02 NOTE — Telephone Encounter (Signed)
   Reason for Disposition . [1] Nasal discharge AND [2] present > 10 days  Answer Assessment - Initial Assessment Questions 1. ONSET: "When did the cough begin?"      Started last week 2. SEVERITY: "How bad is the cough today?"      Moderate 3. RESPIRATORY DISTRESS: "Describe your breathing."      No distress 4. FEVER: "Do you have a fever?" If so, ask: "What is your temperature, how was it measured, and when did it start?"     No 5. SPUTUM: "Describe the color of your sputum" (clear, white, yellow, green)     Light yellow 6. HEMOPTYSIS: "Are you coughing up any blood?" If so ask: "How much?" (flecks, streaks, tablespoons, etc.)     No 7. CARDIAC HISTORY: "Do you have any history of heart disease?" (e.g., heart attack, congestive heart failure)      No 8. LUNG HISTORY: "Do you have any history of lung disease?"  (e.g., pulmonary embolus, asthma, emphysema)     No 9. PE RISK FACTORS: "Do you have a history of blood clots?" (or: recent major surgery, recent prolonged travel, bedridden )     No 10. OTHER SYMPTOMS: "Do you have any other symptoms?" (e.g., runny nose, wheezing, chest pain)       Some rattling 11. PREGNANCY: "Is there any chance you are pregnant?" "When was your last menstrual period?"       No 12. TRAVEL: "Have you traveled out of the country in the last month?" (e.g., travel history, exposures)       No  Protocols used: COUGH - ACUTE PRODUCTIVE-A-AH  States goes to sleep and her cough wakes her up. Still seeing yellow phlegm. Appointment given for Wednesday.

## 2017-05-02 NOTE — Telephone Encounter (Signed)
Copied from [place] [id number]. Topic: Quick Communication - See Telephone Encounter >> May 02, 2017  9:09 AM Corie Chiquito, NT wrote: CRM for notification. See Telephone encounter for: Patient calling because she would like to know if her doctor could call her in a Z-Pack. Patient stated that she still has mucus that needs to get broken up. Patient would like the medication to be sent to the [facility] on [facility] in [place].  If someone could please give her a call back at [phone]  05/02/17.

## 2017-05-02 NOTE — Telephone Encounter (Signed)
See triage note.

## 2017-05-04 ENCOUNTER — Encounter: Payer: Self-pay | Admitting: Family Medicine

## 2017-05-04 ENCOUNTER — Ambulatory Visit: Payer: PPO | Admitting: Family Medicine

## 2017-05-04 VITALS — BP 122/68 | HR 74 | Temp 97.6°F | Wt 205.2 lb

## 2017-05-04 DIAGNOSIS — B9789 Other viral agents as the cause of diseases classified elsewhere: Secondary | ICD-10-CM

## 2017-05-04 DIAGNOSIS — J069 Acute upper respiratory infection, unspecified: Secondary | ICD-10-CM | POA: Diagnosis not present

## 2017-05-04 MED ORDER — AZITHROMYCIN 250 MG PO TABS: ORAL_TABLET | ORAL | 0 refills | Status: DC

## 2017-05-04 MED ORDER — HYDROCOD POLST-CPM POLST ER 10-8 MG/5ML PO SUER: 5.0000 mL | Freq: Every evening | ORAL | 0 refills | Status: DC | PRN

## 2017-05-04 NOTE — Progress Notes (Signed)
Subjective:    Patient ID: Kristie Byrd, female    DOB: 27-Oct-1945, 71 y.o.   MRN: [id number]  HPI This is a 71 yo female who presents today with cough for 2 weeks. Started with sore throat, laryngitis. Took Mucinex x 4-5 days with a little relief. Cough is currently dry, in the morning, she gets a little sputum. Little nasal drainage, no ear pain, no headache. Cough worse at night. No history of asthma. Doesn't feel tight, occasionally hears wheeze. No fever. Feels fine and has been working as usual. Is concerned because she is going to be around grand/great grandchildren this weekend. She requests [employer].   Has history of CKD stage 3, sees nephrologist every 3 months. Reports that her GFR has improved to 50s. Is very careful about taking medications.   Past Medical History:  Diagnosis Date  . Arthritis   . Cancer ([place])    cervix, skin  . Colon polyps   . COPD (chronic obstructive pulmonary disease) ([place])   . Depression   . GERD (gastroesophageal reflux disease)   . Headache   . Hypertension   . Iron deficiency anemia 09/27/2012  . PONV (postoperative nausea and vomiting)   . Shingles    Past Surgical History:  Procedure Laterality Date  . BREAST BIOPSY Bilateral   . CARPAL TUNNEL RELEASE Bilateral   . COLONOSCOPY  multiple  . KNEE ARTHROPLASTY Right 05/12/2016   Procedure: COMPUTER ASSISTED TOTAL KNEE ARTHROPLASTY;  Surgeon: Dereck Leep, MD;  Location: [facility] ORS;  Service: Orthopedics;  Laterality: Right;  . KNEE SURGERY Right   . NOSE SURGERY    . TONSILLECTOMY     No family history on file. Social History   Tobacco Use  . Smoking status: Former Smoker    Packs/day: 1.00    Years: 37.00    Pack years: 37.00    Types: Cigarettes    Last attempt to quit: 11/15/1996    Years since quitting: 20.4  . Smokeless tobacco: Never Used  Substance Use Topics  . Alcohol use: No    Alcohol/week: 0.0 oz  . Drug use: No      Review of Systems Per HPI    Objective:   Physical Exam  Constitutional: She is oriented to person, place, and time. She appears well-developed and well-nourished. No distress.  HENT:  Head: Normocephalic and atraumatic.  Nose: Nose normal.  Mouth/Throat: Oropharynx is clear and moist.  Eyes: Conjunctivae are normal.  Neck: Normal range of motion. Neck supple.  Cardiovascular: Normal rate, regular rhythm and normal heart sounds.  Pulmonary/Chest: Effort normal and breath sounds normal.  Occasional dry cough.   Lymphadenopathy:    She has no cervical adenopathy.  Neurological: She is alert and oriented to person, place, and time.  Skin: Skin is warm and dry. She is not diaphoretic.  Psychiatric: She has a normal mood and affect. Her behavior is normal. Judgment and thought content normal.  Vitals reviewed.     BP 122/68 (BP Location: Right Arm, Patient Position: Sitting, Cuff Size: Normal)   Pulse 74   Temp 97.6 F (36.4 C) (Oral)   Wt 205 lb 4 oz (93.1 kg)   SpO2 96%   BMI 35.23 kg/m  Wt Readings from Last 3 Encounters:  05/04/17 205 lb 4 oz (93.1 kg)  11/30/16 203 lb (92.1 kg)  07/14/16 216 lb (98 kg)       Assessment & Plan:  1. Viral URI with cough - Provided written  and verbal information regarding diagnosis and treatment. - She declined prescription cough syrup, will try OTC  - Encouraged her to increase fluids - If not better in 2-4 days, can taken antibiotic - azithromycin (ZITHROMAX) 250 MG tablet; Take 2 tabs PO x 1 dose, then 1 tab PO QD x 4 days  Dispense: 6 tablet; Refill: 0   Clarene Reamer, FNP-BC  [facility] [place] [facility]  05/04/2017 12:47 PM

## 2017-05-04 NOTE — Patient Instructions (Signed)
If not better in 2-4 days, can start antibiotic  Can take Delsym during day, prescription cough syrup at night  Drink enough liquid to make urine light yellow

## 2017-05-12 DIAGNOSIS — Z96651 Presence of right artificial knee joint: Secondary | ICD-10-CM | POA: Diagnosis not present

## 2017-06-21 DIAGNOSIS — I10 Essential (primary) hypertension: Secondary | ICD-10-CM | POA: Diagnosis not present

## 2017-06-21 DIAGNOSIS — D631 Anemia in chronic kidney disease: Secondary | ICD-10-CM | POA: Diagnosis not present

## 2017-06-21 DIAGNOSIS — E559 Vitamin D deficiency, unspecified: Secondary | ICD-10-CM | POA: Diagnosis not present

## 2017-06-21 DIAGNOSIS — N183 Chronic kidney disease, stage 3 (moderate): Secondary | ICD-10-CM | POA: Diagnosis not present

## 2017-06-23 ENCOUNTER — Encounter: Payer: Self-pay | Admitting: Family Medicine

## 2017-06-30 ENCOUNTER — Other Ambulatory Visit: Payer: Self-pay

## 2017-06-30 MED ORDER — SERTRALINE HCL 50 MG PO TABS: 50.0000 mg | ORAL_TABLET | Freq: Every day | ORAL | 0 refills | Status: DC

## 2017-07-20 ENCOUNTER — Other Ambulatory Visit: Payer: Self-pay | Admitting: Family Medicine

## 2017-07-20 MED ORDER — LISINOPRIL 5 MG PO TABS: 5.0000 mg | ORAL_TABLET | Freq: Every day | ORAL | 0 refills | Status: DC

## 2017-07-20 NOTE — Telephone Encounter (Signed)
Copied from [place] [id number]. Topic: Quick Communication - Rx Refill/Question >> Jul 20, 2017  9:36 AM Lolita Rieger, RMA wrote: Medication: lisinopril 5 mg   Has the patient contacted their pharmacy? yes   (Agent: If no, request that the patient contact the pharmacy for the refill.)   Preferred Pharmacy (with phone number or street name): [facility]  Pharmacist stated that she would need refills on all of pt medication due to change in pharmacy   Agent: Please be advised that RX refills may take up to 3 business days. We ask that you follow-up with your pharmacy.

## 2017-08-12 ENCOUNTER — Other Ambulatory Visit: Payer: Self-pay | Admitting: Family Medicine

## 2017-08-12 DIAGNOSIS — N289 Disorder of kidney and ureter, unspecified: Secondary | ICD-10-CM

## 2017-08-12 DIAGNOSIS — E669 Obesity, unspecified: Secondary | ICD-10-CM

## 2017-08-12 DIAGNOSIS — I10 Essential (primary) hypertension: Secondary | ICD-10-CM

## 2017-08-17 ENCOUNTER — Ambulatory Visit: Payer: PPO | Admitting: Family Medicine

## 2017-08-19 ENCOUNTER — Ambulatory Visit (INDEPENDENT_AMBULATORY_CARE_PROVIDER_SITE_OTHER): Payer: PPO | Admitting: Family Medicine

## 2017-08-19 ENCOUNTER — Other Ambulatory Visit (INDEPENDENT_AMBULATORY_CARE_PROVIDER_SITE_OTHER): Payer: PPO

## 2017-08-19 ENCOUNTER — Encounter: Payer: PPO | Admitting: Family Medicine

## 2017-08-19 ENCOUNTER — Encounter: Payer: Self-pay | Admitting: Family Medicine

## 2017-08-19 VITALS — BP 124/66 | HR 83 | Temp 98.0°F | Ht 63.5 in | Wt 205.5 lb

## 2017-08-19 DIAGNOSIS — N289 Disorder of kidney and ureter, unspecified: Secondary | ICD-10-CM

## 2017-08-19 DIAGNOSIS — D509 Iron deficiency anemia, unspecified: Secondary | ICD-10-CM

## 2017-08-19 DIAGNOSIS — E669 Obesity, unspecified: Secondary | ICD-10-CM | POA: Diagnosis not present

## 2017-08-19 DIAGNOSIS — E538 Deficiency of other specified B group vitamins: Secondary | ICD-10-CM | POA: Diagnosis not present

## 2017-08-19 DIAGNOSIS — F419 Anxiety disorder, unspecified: Secondary | ICD-10-CM | POA: Diagnosis not present

## 2017-08-19 DIAGNOSIS — I10 Essential (primary) hypertension: Secondary | ICD-10-CM | POA: Diagnosis not present

## 2017-08-19 DIAGNOSIS — Z7689 Persons encountering health services in other specified circumstances: Secondary | ICD-10-CM | POA: Diagnosis not present

## 2017-08-19 LAB — CBC WITH DIFFERENTIAL/PLATELET
BASOS ABS: 0 10*3/uL (ref 0.0–0.1)
BASOS PCT: 0.9 % (ref 0.0–3.0)
EOS PCT: 1.2 % (ref 0.0–5.0)
Eosinophils Absolute: 0 10*3/uL (ref 0.0–0.7)
HCT: 35.2 % — ABNORMAL LOW (ref 36.0–46.0)
Hemoglobin: 11.9 g/dL — ABNORMAL LOW (ref 12.0–15.0)
LYMPHS ABS: 0.9 10*3/uL (ref 0.7–4.0)
Lymphocytes Relative: 24.7 % (ref 12.0–46.0)
MCHC: 33.9 g/dL (ref 30.0–36.0)
MCV: 89.6 fl (ref 78.0–100.0)
MONOS PCT: 10.5 % (ref 3.0–12.0)
Monocytes Absolute: 0.4 10*3/uL (ref 0.1–1.0)
NEUTROS ABS: 2.2 10*3/uL (ref 1.4–7.7)
NEUTROS PCT: 62.7 % (ref 43.0–77.0)
PLATELETS: 258 10*3/uL (ref 150.0–400.0)
RBC: 3.92 Mil/uL (ref 3.87–5.11)
RDW: 13.8 % (ref 11.5–15.5)
WBC: 3.5 10*3/uL — ABNORMAL LOW (ref 4.0–10.5)

## 2017-08-19 LAB — LIPID PANEL
Cholesterol: 168 mg/dL (ref 0–200)
HDL: 63.9 mg/dL (ref >39.00)
LDL Cholesterol: 85 mg/dL (ref 0–99)
NONHDL: 104.3
Total CHOL/HDL Ratio: 3
Triglycerides: 98 mg/dL (ref 0.0–149.0)
VLDL: 19.6 mg/dL (ref 0.0–40.0)

## 2017-08-19 LAB — VITAMIN B12: VITAMIN B 12: 507 pg/mL (ref 211–911)

## 2017-08-19 LAB — COMPREHENSIVE METABOLIC PANEL
ALT: 15 U/L (ref 0–35)
AST: 19 U/L (ref 0–37)
Albumin: 4 g/dL (ref 3.5–5.2)
Alkaline Phosphatase: 78 U/L (ref 39–117)
BILIRUBIN TOTAL: 0.4 mg/dL (ref 0.2–1.2)
BUN: 20 mg/dL (ref 6–23)
CO2: 32 meq/L (ref 19–32)
Calcium: 9.7 mg/dL (ref 8.4–10.5)
Chloride: 103 mEq/L (ref 96–112)
Creatinine, Ser: 1.18 mg/dL (ref 0.40–1.20)
GFR: 48.01 mL/min — AB (ref >60.00)
GLUCOSE: 88 mg/dL (ref 70–99)
Potassium: 4.7 mEq/L (ref 3.5–5.1)
Sodium: 141 mEq/L (ref 135–145)
Total Protein: 6.8 g/dL (ref 6.0–8.3)

## 2017-08-19 LAB — HEMOGLOBIN A1C: HEMOGLOBIN A1C: 5.5 % (ref 4.6–6.5)

## 2017-08-19 MED ORDER — LISINOPRIL 5 MG PO TABS: 5.0000 mg | ORAL_TABLET | Freq: Every day | ORAL | 3 refills | Status: DC

## 2017-08-19 MED ORDER — BUPROPION HCL ER (XL) 300 MG PO TB24: 300.0000 mg | ORAL_TABLET | Freq: Every day | ORAL | 3 refills | Status: DC

## 2017-08-19 NOTE — Patient Instructions (Addendum)
It was good meeting you today, We have sent refills for Lisinopril and Wellbutrin. We will see you next week for your wellness exam  It has been a pleasure seeing you today. Denita Lung, RN, Adult-Geriatric Nurse Practitioner Student and [facility], FNP

## 2017-08-19 NOTE — Progress Notes (Signed)
Subjective:    Patient ID: Kristie Byrd, female    DOB: 07/04/1945, 72 y.o.   MRN: [id number]  HPI This is a 72 yo female who presents today to establish care. Transferring from Dr. Deborra Medina.  Has started a part time job with [employer] 20 hours a week, hoping to get hired permanently. Had a difficult time finding work at her age.  Is taking hemp oil orally, 11 mg twice a day, has been taking for about a week. Thinks it is helping aches and pains.   She denies chest pain, SOB, weakness, falls.   Past Medical History:  Diagnosis Date  . Arthritis   . Cancer ([place])    cervix, skin  . Colon polyps   . COPD (chronic obstructive pulmonary disease) ([place])   . Depression   . GERD (gastroesophageal reflux disease)   . Headache   . Hypertension   . Iron deficiency anemia 09/27/2012  . PONV (postoperative nausea and vomiting)   . Shingles    Past Surgical History:  Procedure Laterality Date  . BREAST BIOPSY Bilateral   . CARPAL TUNNEL RELEASE Bilateral   . COLONOSCOPY  multiple  . KNEE ARTHROPLASTY Right 05/12/2016   Procedure: COMPUTER ASSISTED TOTAL KNEE ARTHROPLASTY;  Surgeon: Dereck Leep, MD;  Location: [facility] ORS;  Service: Orthopedics;  Laterality: Right;  . KNEE SURGERY Right   . NOSE SURGERY    . TONSILLECTOMY     No family history on file. Social History   Tobacco Use  . Smoking status: Former Smoker    Packs/day: 1.00    Years: 37.00    Pack years: 37.00    Types: Cigarettes    Last attempt to quit: 11/15/1996    Years since quitting: 20.7  . Smokeless tobacco: Never Used  Substance Use Topics  . Alcohol use: No    Alcohol/week: 0.0 oz  . Drug use: No      Review of Systems Per HPI    Objective:   Physical Exam Physical Exam  Constitutional: Oriented to person, place, and time. She appears well-developed and well-nourished.  HENT:  Head: Normocephalic and atraumatic.  Eyes: Conjunctivae are normal.  Neck: Normal range of motion. Neck supple.    Cardiovascular: Normal rate, regular rhythm and normal heart sounds.   Pulmonary/Chest: Effort normal and breath sounds normal.  Musculoskeletal: No edema.  Neurological: Alert and oriented to person, place, and time.  Skin: Skin is warm and dry.  Psychiatric: Normal mood and affect. Behavior is normal. Judgment and thought content normal.  Vitals reviewed.     BP 124/66   Pulse 83   Temp 98 F (36.7 C) (Oral)   Ht 5' 3.5" (1.613 m)   Wt 205 lb 8 oz (93.2 kg)   SpO2 95%   BMI 35.83 kg/m  Wt Readings from Last 3 Encounters:  08/19/17 205 lb 8 oz (93.2 kg)  05/04/17 205 lb 4 oz (93.1 kg)  11/30/16 203 lb (92.1 kg)       Assessment & Plan:  1. Encounter to establish care - Discussed and encouraged healthy lifestyle choices- adequate sleep, regular exercise, stress management and healthy food choices.  - follow up for AWV/CPE as scheuled  2. Iron deficiency anemia, unspecified iron deficiency anemia type - continue current iron supplementation - CMET, CBC  3. Renal insufficiency - CMET, CBC  4. Essential hypertension - CMET, CBC - lisinopril (PRINIVIL,ZESTRIL) 5 MG tablet; Take 1 tablet (5 mg total) by mouth  daily.  Dispense: 90 tablet; Refill: 3  5. Vitamin B12 deficiency - Vitamin B12  6. Anxiety - doing well on current dose, continue - buPROPion (WELLBUTRIN XL) 300 MG 24 hr tablet; Take 1 tablet (300 mg total) by mouth daily.  Dispense: 90 tablet; Refill: [place], FNP-BC  [facility] [place] [facility]  09/04/2017 6:46 PM

## 2017-08-19 NOTE — Progress Notes (Signed)
Subjective:    Patient ID: Kristie Byrd, female    DOB: [birth year], 72 y.o.   MRN: [id number]  Kristie Byrd is a 72 y.o. female who presents today to transfer care to [facility]ters, [place]. Historically seen by Dr. Deborra Medina. She has a PMH significant of HTN, COPD, GERD, OA of R Knee, Fe Def. Anemia, and Renal Insufficiency. 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 Has recently started taking Hemp oil to help her kidneys improve and states that she overall feels better.   The 10-year ASCVD risk score Mikey Bussing DC Brooke Bonito., et al., 2013) is: 11.2%   Values used to calculate the score:     Age: 15 years     Sex: Female     Is Non-Hispanic African American: No     Diabetic: No     Tobacco smoker: No     Systolic Blood Pressure: 681 mmHg     Is BP treated: Yes     HDL Cholesterol: 50.9 mg/dL     Total Cholesterol: 155 mg/dL  Review of Systems  Constitutional: Negative for activity change and fatigue.  Respiratory: Negative for chest tightness and shortness of breath.   Cardiovascular: Negative for chest pain.  Gastrointestinal: Negative for constipation, diarrhea, nausea and vomiting.  Genitourinary: Negative for difficulty urinating and dysuria.      Past Medical History:  Diagnosis Date  . Arthritis   . Cancer ([place])    cervix, skin  . Colon polyps   . COPD (chronic obstructive pulmonary disease) ([place])   . Depression   . GERD (gastroesophageal reflux disease)   . Headache   . Hypertension   . Iron deficiency anemia 09/27/2012  . PONV (postoperative nausea and vomiting)   . Shingles    Past Surgical History:  Procedure Laterality Date  . BREAST BIOPSY Bilateral   . CARPAL TUNNEL RELEASE Bilateral   . COLONOSCOPY  multiple  . KNEE ARTHROPLASTY Right 05/12/2016   Procedure: COMPUTER ASSISTED TOTAL KNEE ARTHROPLASTY;  Surgeon: Dereck Leep, MD;  Location: [facility] ORS;  Service: Orthopedics;  Laterality: Right;  . KNEE SURGERY Right   . NOSE SURGERY      . TONSILLECTOMY     Social History   Socioeconomic History  . Marital status: Single    Spouse name: Not on file  . Number of children: 2  . Years of education: Not on file  . Highest education level: Not on file  Occupational History  . Occupation: retired  Scientific laboratory technician  . Financial resource strain: Not on file  . Food insecurity:    Worry: Not on file    Inability: Not on file  . Transportation needs:    Medical: Not on file    Non-medical: Not on file  Tobacco Use  . Smoking status: Former Smoker    Packs/day: 1.00    Years: 37.00    Pack years: 37.00    Types: Cigarettes    Last attempt to quit: 11/15/1996    Years since quitting: 20.7  . Smokeless tobacco: Never Used  Substance and Sexual Activity  . Alcohol use: No    Alcohol/week: 0.0 oz  . Drug use: No  . Sexual activity: Never  Lifestyle  . Physical activity:    Days per week: Not on file    Minutes per session: Not on file  . Stress: Not on file  Relationships  . Social connections:    Talks on phone: Not on  file    Gets together: Not on file    Attends religious service: Not on file    Active member of club or organization: Not on file    Attends meetings of clubs or organizations: Not on file    Relationship status: Not on file  . Intimate partner violence:    Fear of current or ex partner: Not on file    Emotionally abused: Not on file    Physically abused: Not on file    Forced sexual activity: Not on file  Other Topics Concern  . Not on file  Social History Narrative   Lives alone.  Divorced. Two daughters- in 70s, 3 grandchildren.   Does not know family history- raised in [facility].        Desires CPR.  Does not have HPOA.   She would possibly want life support if for short period of time . Unsure about feeding tube.   No family history on file. Current Outpatient Medications on File Prior to Visit  Medication Sig Dispense Refill  . acetaminophen (TYLENOL) 650 MG CR tablet  Take by mouth.    Lorin Picket 1 GM 210 MG(Fe) tablet   0  . buPROPion (WELLBUTRIN XL) 300 MG 24 hr tablet take 1 tablet by mouth once daily 30 tablet 11  . Cholecalciferol (VITAMIN D3) 1000 units CAPS Take 1,000 Units by mouth daily.    . cyanocobalamin (,VITAMIN B-12,) 1000 MCG/ML injection inject 1 milliliter intramuscularly every month 1 mL 3  . lisinopril (PRINIVIL,ZESTRIL) 5 MG tablet Take 1 tablet (5 mg total) by mouth daily. 30 tablet 0  . Multiple Vitamin (MULTIVITAMIN) tablet Take 1 tablet by mouth at bedtime.     . sertraline (ZOLOFT) 50 MG tablet Take 1 tablet (50 mg total) by mouth daily. 90 tablet 0  . aspirin 81 MG chewable tablet Chew by mouth daily.     No current facility-administered medications on file prior to visit.      Objective:   Physical Exam  Constitutional: She is oriented to person, place, and time. Vital signs are normal. She appears well-nourished.  Neurological: She is alert and oriented to person, place, and time.  Psychiatric: She has a normal mood and affect. Her speech is normal and behavior is normal.   BP 124/66   Pulse 83   Temp 98 F (36.7 C) (Oral)   Ht 5' 3.5" (1.613 m)   Wt 205 lb 8 oz (93.2 kg)   SpO2 95%   BMI 35.83 kg/m   Wt Readings from Last 3 Encounters:  08/19/17 205 lb 8 oz (93.2 kg)  05/04/17 205 lb 4 oz (93.1 kg)  11/30/16 203 lb (92.1 kg)   BP Readings from Last 3 Encounters:  08/19/17 124/66  05/04/17 122/68  07/14/16 100/62       Assessment & Plan:   1. Encounter to establish care - Check Labs (CMET, CBC, Lipid, A1c, and Vit B12) - Complete physical already scheduled in a week  6. Vitamin B12 deficiency - Check Vitamin B12  3. Iron deficiency anemia, unspecified iron deficiency anemia type - Check CBC  4. Renal insufficiency - Check CMET  5. Essential hypertension - Refill sent for Lisinopril  Denita Lung, RN, Adult-Geriatric Nurse Practitioner Student

## 2017-08-22 ENCOUNTER — Ambulatory Visit (INDEPENDENT_AMBULATORY_CARE_PROVIDER_SITE_OTHER): Payer: PPO

## 2017-08-22 ENCOUNTER — Ambulatory Visit (INDEPENDENT_AMBULATORY_CARE_PROVIDER_SITE_OTHER): Payer: PPO | Admitting: Family Medicine

## 2017-08-22 VITALS — BP 110/64 | HR 88 | Temp 98.1°F | Ht 63.25 in | Wt 207.2 lb

## 2017-08-22 DIAGNOSIS — Z0000 Encounter for general adult medical examination without abnormal findings: Secondary | ICD-10-CM | POA: Diagnosis not present

## 2017-08-22 NOTE — Patient Instructions (Addendum)
Please call and schedule an appointment for screening mammogram. A referral is not needed.  [facility]- [phone]    

## 2017-08-22 NOTE — Progress Notes (Signed)
Subjective:    Patient ID: Kristie Byrd, female    DOB: Mar 25, 1946, 72 y.o.   MRN: [id number]  HPI This is a 72 yo female who presents today for CPE, had AWV earlier today.   Had labs last week, reviewed with patient.   Shingrix- had on same day as pneumonia, flu, B12. Had painful localized reaction and felt terrible.   Colonoscopy- good until 2024  Has noted left handed tremor at rest, does not interfere with daily activities.  No concerns today. Upset because her granddaughter had a miscarriage and lost her twins.   Past Medical History:  Diagnosis Date  . Arthritis   . Cancer ([place])    cervix, skin  . Colon polyps   . COPD (chronic obstructive pulmonary disease) ([place])   . Depression   . GERD (gastroesophageal reflux disease)   . Headache   . Hypertension   . Iron deficiency anemia 09/27/2012  . PONV (postoperative nausea and vomiting)   . Shingles    Past Surgical History:  Procedure Laterality Date  . BREAST BIOPSY Bilateral   . CARPAL TUNNEL RELEASE Bilateral   . COLONOSCOPY  multiple  . KNEE ARTHROPLASTY Right 05/12/2016   Procedure: COMPUTER ASSISTED TOTAL KNEE ARTHROPLASTY;  Surgeon: Dereck Leep, MD;  Location: [facility] ORS;  Service: Orthopedics;  Laterality: Right;  . KNEE SURGERY Right   . NOSE SURGERY    . TONSILLECTOMY     No family history on file. Social History   Tobacco Use  . Smoking status: Former Smoker    Packs/day: 1.00    Years: 37.00    Pack years: 37.00    Types: Cigarettes    Last attempt to quit: 11/15/1996    Years since quitting: 20.7  . Smokeless tobacco: Never Used  Substance Use Topics  . Alcohol use: No    Alcohol/week: 0.0 oz  . Drug use: No    Comment: uses CBD oil      Review of Systems  Constitutional: Negative.   HENT: Negative.   Eyes: Negative.   Respiratory: Negative.   Cardiovascular: Negative.   Endocrine: Negative.   Genitourinary: Negative.   Musculoskeletal:       Knee and shoulder pain  intermittently.   Allergic/Immunologic: Negative.   Neurological: Negative.   Hematological: Negative.   Psychiatric/Behavioral:       Has been doing well, sad and upset with recent bad news.        Objective:   Physical Exam  Constitutional: She is oriented to person, place, and time. She appears well-developed and well-nourished. No distress.  HENT:  Head: Normocephalic and atraumatic.  Eyes: Conjunctivae and EOM are normal.  Neck: Normal range of motion. Neck supple.  Cardiovascular: Normal rate, regular rhythm and normal heart sounds.  Pulmonary/Chest: Effort normal and breath sounds normal.  Musculoskeletal: Normal range of motion. She exhibits no edema.  Lymphadenopathy:    She has no cervical adenopathy.  Neurological: She is alert and oriented to person, place, and time.  Skin: Skin is warm and dry. She is not diaphoretic.  Psychiatric: She has a normal mood and affect. Her behavior is normal. Judgment and thought content normal.  Vitals reviewed.     BP 110/64 (BP Location: Right Arm, Patient Position: Sitting, Cuff Size: Normal)   Pulse 88   Temp 98.1 F (36.7 C) (Oral)   Ht 5' 3.25" (1.607 m) Comment: no shoes  Wt 207 lb 4 oz (94 kg)   SpO2 95%  BMI 36.42 kg/m      Assessment & Plan:  1. Annual physical exam - Discussed and encouraged healthy lifestyle choices- adequate sleep, regular exercise, stress management and healthy food choices.  - provided information for her to schedule mammogram - discussed labs - follow up in 6 months   Clarene Reamer, FNP-BC  [facility] at [facility], [place]  09/04/2017 6:57 PM

## 2017-08-22 NOTE — Patient Instructions (Signed)
[name] , Thank you for taking time to come for your Medicare Wellness Visit. I appreciate your ongoing commitment to your health goals. Please review the following plan we discussed and let me know if I can assist you in the future.   These are the goals we discussed: Goals    . Increase physical activity     Starting 08/22/2017, I will continue to walk 8500-10000 steps daily.        This is a list of the screening recommended for you and due dates:  Health Maintenance  Topic Date Due  . Mammogram  11/13/2017*  . DEXA scan (bone density measurement)  08/22/2048*  . Flu Shot  12/15/2017  . Colon Cancer Screening  02/07/2020  . Tetanus Vaccine  02/19/2025  .  Hepatitis C: One time screening is recommended by Center for Disease Control  (CDC) for  adults born from [birth year] through 1964.   Completed  . Pneumonia vaccines  Completed  *Topic was postponed. The date shown is not the original due date.   Preventive Care for Adults  A healthy lifestyle and preventive care can promote health and wellness. Preventive health guidelines for adults include the following key practices.  . A routine yearly physical is a good way to check with your health care provider about your health and preventive screening. It is a chance to share any concerns and updates on your health and to receive a thorough exam.  . Visit your dentist for a routine exam and preventive care every 6 months. Brush your teeth twice a day and floss once a day. Good oral hygiene prevents tooth decay and gum disease.  . The frequency of eye exams is based on your age, health, family medical history, use  of contact lenses, and other factors. Follow your health care provider's recommendations for frequency of eye exams.  . Eat a healthy diet. Foods like vegetables, fruits, whole grains, low-fat dairy products, and lean protein foods contain the nutrients you need without too many calories. Decrease your intake of foods high in solid  fats, added sugars, and salt. Eat the right amount of calories for you. Get information about a proper diet from your health care provider, if necessary.  . Regular physical exercise is one of the most important things you can do for your health. Most adults should get at least 150 minutes of moderate-intensity exercise (any activity that increases your heart rate and causes you to sweat) each week. In addition, most adults need muscle-strengthening exercises on 2 or more days a week.  Silver Sneakers may be a benefit available to you. To determine eligibility, you may visit the website: www.silversneakers.com or contact program at 225-023-7329 Mon-Fri between 8AM-8PM.   . Maintain a healthy weight. The body mass index (BMI) is a screening tool to identify possible weight problems. It provides an estimate of body fat based on height and weight. Your health care provider can find your BMI and can help you achieve or maintain a healthy weight.   For adults 20 years and older: ? A BMI below 18.5 is considered underweight. ? A BMI of 18.5 to 24.9 is normal. ? A BMI of 25 to 29.9 is considered overweight. ? A BMI of 30 and above is considered obese.   . Maintain normal blood lipids and cholesterol levels by exercising and minimizing your intake of saturated fat. Eat a balanced diet with plenty of fruit and vegetables. Blood tests for lipids and cholesterol should begin  at age 21 and be repeated every 5 years. If your lipid or cholesterol levels are high, you are over 50, or you are at high risk for heart disease, you may need your cholesterol levels checked more frequently. Ongoing high lipid and cholesterol levels should be treated with medicines if diet and exercise are not working.  . If you smoke, find out from your health care provider how to quit. If you do not use tobacco, please do not start.  . If you choose to drink alcohol, please do not consume more than 2 drinks per day. One drink is  considered to be 12 ounces (355 mL) of beer, 5 ounces (148 mL) of wine, or 1.5 ounces (44 mL) of liquor.  . If you are 78-55 years old, ask your health care provider if you should take aspirin to prevent strokes.  . Use sunscreen. Apply sunscreen liberally and repeatedly throughout the day. You should seek shade when your shadow is shorter than you. Protect yourself by wearing long sleeves, pants, a wide-brimmed hat, and sunglasses year round, whenever you are outdoors.  . Once a month, do a whole body skin exam, using a mirror to look at the skin on your back. Tell your health care provider of new moles, moles that have irregular borders, moles that are larger than a pencil eraser, or moles that have changed in shape or color.

## 2017-08-22 NOTE — Progress Notes (Signed)
Subjective:   Kristie Byrd is a 72 y.o. female who presents for Medicare Annual (Subsequent) preventive examination.  Review of Systems:  N/A Cardiac Risk Factors include: advanced age (>85men, >29 women);hypertension;obesity (BMI >30kg/m2)     Objective:     Vitals: BP 110/64 (BP Location: Right Arm, Patient Position: Sitting, Cuff Size: Normal)   Pulse 88   Temp 98.1 F (36.7 C) (Oral)   Ht 5' 3.25" (1.607 m) Comment: no shoes  Wt 207 lb 4 oz (94 kg)   SpO2 95%   BMI 36.42 kg/m   Body mass index is 36.42 kg/m.  Advanced Directives 08/22/2017 11/30/2016 07/05/2016 05/12/2016 05/12/2016 04/28/2016  Does Patient Have a Medical Advance Directive? No No No No No No  Would patient like information on creating a medical advance directive? No - Patient declined - - No - Patient declined No - Patient declined No - Patient declined    Tobacco Social History   Tobacco Use  Smoking Status Former Smoker  . Packs/day: 1.00  . Years: 37.00  . Pack years: 37.00  . Types: Cigarettes  . Last attempt to quit: 11/15/1996  . Years since quitting: 20.7  Smokeless Tobacco Never Used     Counseling given: No   Clinical Intake:  Pre-visit preparation completed: Yes  Pain : No/denies pain Pain Score: 0-No pain     Nutritional Status: BMI > 30  Obese Nutritional Risks: None Diabetes: No  How often do you need to have someone help you when you read instructions, pamphlets, or other written materials from your doctor or pharmacy?: 1 - Never What is the last grade level you completed in school?: 12th grade + some college courses  Interpreter Needed?: No  Comments: pt lives alone Information entered by :: LPinson, LPN  Past Medical History:  Diagnosis Date  . Arthritis   . Cancer ([place])    cervix, skin  . Colon polyps   . COPD (chronic obstructive pulmonary disease) ([place])   . Depression   . GERD (gastroesophageal reflux disease)   . Headache   . Hypertension   . Iron  deficiency anemia 09/27/2012  . PONV (postoperative nausea and vomiting)   . Shingles    Past Surgical History:  Procedure Laterality Date  . BREAST BIOPSY Bilateral   . CARPAL TUNNEL RELEASE Bilateral   . COLONOSCOPY  multiple  . KNEE ARTHROPLASTY Right 05/12/2016   Procedure: COMPUTER ASSISTED TOTAL KNEE ARTHROPLASTY;  Surgeon: Dereck Leep, MD;  Location: [facility] ORS;  Service: Orthopedics;  Laterality: Right;  . KNEE SURGERY Right   . NOSE SURGERY    . TONSILLECTOMY     History reviewed. No pertinent family history. Social History   Socioeconomic History  . Marital status: Single    Spouse name: Not on file  . Number of children: 2  . Years of education: Not on file  . Highest education level: Not on file  Occupational History  . Occupation: retired  Scientific laboratory technician  . Financial resource strain: Not on file  . Food insecurity:    Worry: Not on file    Inability: Not on file  . Transportation needs:    Medical: Not on file    Non-medical: Not on file  Tobacco Use  . Smoking status: Former Smoker    Packs/day: 1.00    Years: 37.00    Pack years: 37.00    Types: Cigarettes    Last attempt to quit: 11/15/1996    Years since  quitting: 20.7  . Smokeless tobacco: Never Used  Substance and Sexual Activity  . Alcohol use: No    Alcohol/week: 0.0 oz  . Drug use: No    Comment: uses CBD oil   . Sexual activity: Never  Lifestyle  . Physical activity:    Days per week: Not on file    Minutes per session: Not on file  . Stress: Not on file  Relationships  . Social connections:    Talks on phone: Not on file    Gets together: Not on file    Attends religious service: Not on file    Active member of club or organization: Not on file    Attends meetings of clubs or organizations: Not on file    Relationship status: Not on file  Other Topics Concern  . Not on file  Social History Narrative   Lives alone.  Divorced. Two daughters- in 3s, 3 grandchildren.   Does not know  family history- raised in [facility].        Desires CPR.  Does not have HPOA.   She would possibly want life support if for short period of time . Unsure about feeding tube.    Outpatient Encounter Medications as of 08/22/2017  Medication Sig  . acetaminophen (TYLENOL) 650 MG CR tablet Take by mouth.  Lorin Picket 1 GM 210 MG(Fe) tablet   . buPROPion (WELLBUTRIN XL) 300 MG 24 hr tablet Take 1 tablet (300 mg total) by mouth daily.  . Cholecalciferol (VITAMIN D3) 1000 units CAPS Take 1,000 Units by mouth daily.  . cyanocobalamin (,VITAMIN B-12,) 1000 MCG/ML injection inject 1 milliliter intramuscularly every month  . lisinopril (PRINIVIL,ZESTRIL) 5 MG tablet Take 1 tablet (5 mg total) by mouth daily.  . Multiple Vitamin (MULTIVITAMIN) tablet Take 1 tablet by mouth at bedtime.   . sertraline (ZOLOFT) 50 MG tablet Take 1 tablet (50 mg total) by mouth daily.  . [DISCONTINUED] aspirin 81 MG chewable tablet Chew by mouth daily.   No facility-administered encounter medications on file as of 08/22/2017.     Activities of Daily Living In your present state of health, do you have any difficulty performing the following activities: 08/22/2017  Hearing? Y  Vision? N  Difficulty concentrating or making decisions? Y  Walking or climbing stairs? N  Dressing or bathing? N  Doing errands, shopping? N  Preparing Food and eating ? N  Using the Toilet? N  In the past six months, have you accidently leaked urine? N  Do you have problems with loss of bowel control? N  Managing your Medications? N  Managing your Finances? N  Housekeeping or managing your Housekeeping? N  Some recent data might be hidden    Patient Care Team: Elby Beck, FNP as PCP - General (Nurse Practitioner) Juanito Doom, MD as Consulting Physician (Pulmonary Disease) Forest Gleason, MD as Consulting Physician (Oncology) Gatha Mayer, MD as Consulting Physician (Gastroenterology) Leandrew Koyanagi, MD as  Referring Physician (Ophthalmology)    Assessment:   This is a routine wellness examination for Kristie Byrd.  Exercise Activities and Dietary recommendations Current Exercise Habits: Home exercise routine, Type of exercise: walking(Goal is 10000 steps daily), Frequency (Times/Week): 7, Intensity: Mild, Exercise limited by: None identified  Goals    . Increase physical activity     Starting 08/22/2017, I will continue to walk 8500-10000 steps daily.        Fall Risk Fall Risk  08/22/2017 11/30/2016 07/05/2016 05/27/2015 09/27/2012  Falls in the past year? No Yes No Yes Yes  Number falls in past yr: - 1 - 1 1  Injury with Fall? - No - No No   Depression Screen PHQ 2/9 Scores 11/30/2016 07/05/2016 05/27/2015 09/27/2012  PHQ - 2 Score 0 0 0 2     Cognitive Function MMSE - Mini Mental State Exam 08/22/2017 07/05/2016  Orientation to time 5 5  Orientation to Place 5 5  Registration 3 3  Attention/ Calculation 0 0  Recall 3 3  Language- name 2 objects 0 0  Language- repeat 1 1  Language- follow 3 step command 3 3  Language- read & follow direction 0 0  Write a sentence 0 0  Copy design 0 0  Total score 20 20     PLEASE NOTE: A Mini-Cog screen was completed. Maximum score is 20. A value of 0 denotes this part of Folstein MMSE was not completed or the patient failed this part of the Mini-Cog screening.   Mini-Cog Screening Orientation to Time - Max 5 pts Orientation to Place - Max 5 pts Registration - Max 3 pts Recall - Max 3 pts Language Repeat - Max 1 pts Language Follow 3 Step Command - Max 3 pts     Immunization History  Administered Date(s) Administered  . Influenza Split 02/15/2013  . Influenza, High Dose Seasonal PF 02/02/2014, 01/21/2015  . Influenza, Seasonal, Injecte, Preservative Fre 01/02/2016  . Influenza-Unspecified 01/25/2017  . Pneumococcal Conjugate-13 02/02/2014  . Pneumococcal Polysaccharide-23 01/25/2017  . Pneumococcal-Unspecified 02/19/2013  . Td 11/05/2013  .  Tdap 02/20/2015  . Zoster 12/27/2012  . Zoster Recombinat (Shingrix) 01/25/2017    Screening Tests Health Maintenance  Topic Date Due  . MAMMOGRAM  11/13/2017 (Originally 11/15/2015)  . DEXA SCAN  08/22/2048 (Originally 10/10/2011)  . INFLUENZA VACCINE  12/15/2017  . COLONOSCOPY  02/07/2020  . TETANUS/TDAP  02/19/2025  . Hepatitis C Screening  Completed  . PNA vac Low Risk Adult  Completed     Plan:     I have personally reviewed, addressed, and noted the following in the patient's chart:  A. Medical and social history B. Use of alcohol, tobacco or illicit drugs  C. Current medications and supplements D. Functional ability and status E.  Nutritional status F.  Physical activity G. Advance directives H. List of other physicians I.  Hospitalizations, surgeries, and ER visits in previous 12 months J.  Silt to include hearing, vision, cognitive, depression L. Referrals and appointments - none  In addition, I have reviewed and discussed with patient certain preventive protocols, quality metrics, and best practice recommendations. A written personalized care plan for preventive services as well as general preventive health recommendations were provided to patient.  See attached scanned questionnaire for additional information.   Signed,   Lindell Noe, MHA, BS, LPN Health Coach

## 2017-08-22 NOTE — Progress Notes (Signed)
PCP notes:   Health maintenance:  Mammogram - addressed  Abnormal screenings:   Hearing - failed  Hearing Screening   125Hz  250Hz  500Hz  1000Hz  2000Hz  3000Hz  4000Hz  6000Hz  8000Hz   Right ear:   0 0 40  0    Left ear:   40 0 40  0     Patient concerns:   Patient is grieving over loss of unborn great-grandchildren.   Patient stated she had a reaction to first Shingrix and is questioning should she have 2nd vaccine. PCP please address at next appt.   Nurse concerns:  None  Next PCP appt:   08/22/17 @ 1500

## 2017-08-23 NOTE — Progress Notes (Signed)
I reviewed health advisor's note, was available for consultation, and agree with documentation and plan.  

## 2017-09-04 ENCOUNTER — Encounter: Payer: Self-pay | Admitting: Family Medicine

## 2017-10-06 DIAGNOSIS — D631 Anemia in chronic kidney disease: Secondary | ICD-10-CM | POA: Diagnosis not present

## 2017-10-06 DIAGNOSIS — N183 Chronic kidney disease, stage 3 (moderate): Secondary | ICD-10-CM | POA: Diagnosis not present

## 2017-10-06 DIAGNOSIS — I10 Essential (primary) hypertension: Secondary | ICD-10-CM | POA: Diagnosis not present

## 2017-10-06 DIAGNOSIS — E559 Vitamin D deficiency, unspecified: Secondary | ICD-10-CM | POA: Diagnosis not present

## 2017-10-06 DIAGNOSIS — N2581 Secondary hyperparathyroidism of renal origin: Secondary | ICD-10-CM | POA: Diagnosis not present

## 2017-10-12 ENCOUNTER — Telehealth: Payer: Self-pay | Admitting: Family Medicine

## 2017-10-12 ENCOUNTER — Other Ambulatory Visit: Payer: Self-pay | Admitting: Family Medicine

## 2017-10-12 MED ORDER — SERTRALINE HCL 50 MG PO TABS: 50.0000 mg | ORAL_TABLET | Freq: Every day | ORAL | 3 refills | Status: DC

## 2017-10-12 NOTE — Telephone Encounter (Signed)
Medication refilled on 5/28

## 2017-10-12 NOTE — Telephone Encounter (Signed)
I spoke with Glenda Chroman FNP earlier and she refilled electronically zoloft to [facility]. [facility] said earlier pt had hung up; I called pt and notified pt refill was done; pt was at [facility]. Pt advised zoloft had been refilled electronically and I apologized for wait; pt said this med had been requested prior to today and I explained I was not saying that was not true but this request today was 1st request I could see in chart. Pt was going in to pick up zoloft. I called [facility] and spoke with Altha Harm and advised pt was at the pharmacy; Christine voiced understanding and would get rx filled for pt.

## 2017-10-12 NOTE — Telephone Encounter (Signed)
Copied from [place] [id number]. Topic: Quick Communication - Rx Refill/Question >> Oct 12, 2017  9:07 AM Bea Graff, NT wrote: Medication: sertraline (ZOLOFT) 50 MG tablet  Has the patient contacted their pharmacy? Yes.   (Agent: If no, request that the patient contact the pharmacy for the refill.) (Agent: If yes, when and what did the pharmacy advise?)  Preferred Pharmacy (with phone number or street name): [place], [place]. [phone] (Phone) [phone] (Fax)      Agent: Please be advised that RX refills may take up to 3 business days. We ask that you follow-up with your pharmacy.

## 2018-02-06 DIAGNOSIS — N183 Chronic kidney disease, stage 3 (moderate): Secondary | ICD-10-CM | POA: Diagnosis not present

## 2018-02-06 DIAGNOSIS — D631 Anemia in chronic kidney disease: Secondary | ICD-10-CM | POA: Diagnosis not present

## 2018-02-06 DIAGNOSIS — N2581 Secondary hyperparathyroidism of renal origin: Secondary | ICD-10-CM | POA: Diagnosis not present

## 2018-02-06 DIAGNOSIS — I10 Essential (primary) hypertension: Secondary | ICD-10-CM | POA: Diagnosis not present

## 2018-05-11 DIAGNOSIS — Z96651 Presence of right artificial knee joint: Secondary | ICD-10-CM | POA: Diagnosis not present

## 2018-05-11 DIAGNOSIS — M1711 Unilateral primary osteoarthritis, right knee: Secondary | ICD-10-CM | POA: Diagnosis not present

## 2018-05-23 ENCOUNTER — Other Ambulatory Visit: Payer: Self-pay

## 2018-05-23 MED ORDER — CYANOCOBALAMIN 1000 MCG/ML IJ SOLN: INTRAMUSCULAR | 3 refills | Status: DC

## 2018-05-23 NOTE — Telephone Encounter (Signed)
Spoke with patient in regards to refill request for B12 injection that we received from [facility]. Patient has been doing monthly B12 injections but did stop for a few months in 2019 and re started in December 2019. Refill sent in. Discussed with Dr. Einar Pheasant. Patient has an appointment in April 2020.

## 2018-06-08 DIAGNOSIS — N183 Chronic kidney disease, stage 3 (moderate): Secondary | ICD-10-CM | POA: Diagnosis not present

## 2018-06-08 DIAGNOSIS — N2581 Secondary hyperparathyroidism of renal origin: Secondary | ICD-10-CM | POA: Diagnosis not present

## 2018-06-08 DIAGNOSIS — I10 Essential (primary) hypertension: Secondary | ICD-10-CM | POA: Diagnosis not present

## 2018-06-08 DIAGNOSIS — D631 Anemia in chronic kidney disease: Secondary | ICD-10-CM | POA: Diagnosis not present

## 2018-07-05 ENCOUNTER — Inpatient Hospital Stay: Payer: PPO | Attending: Internal Medicine | Admitting: Internal Medicine

## 2018-07-05 ENCOUNTER — Encounter: Payer: Self-pay | Admitting: Internal Medicine

## 2018-07-05 DIAGNOSIS — D631 Anemia in chronic kidney disease: Secondary | ICD-10-CM | POA: Insufficient documentation

## 2018-07-05 DIAGNOSIS — Z87891 Personal history of nicotine dependence: Secondary | ICD-10-CM | POA: Diagnosis not present

## 2018-07-05 DIAGNOSIS — N183 Chronic kidney disease, stage 3 unspecified: Secondary | ICD-10-CM

## 2018-07-05 DIAGNOSIS — D649 Anemia, unspecified: Secondary | ICD-10-CM | POA: Diagnosis not present

## 2018-07-05 NOTE — Assessment & Plan Note (Addendum)
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 iron.  Given the ongoing fatigue recommend IV Feraheme x2.  #Discussed the potential IV iron infusion reactions; extremely rare although serious..  Patient agreeable to proceed with infusions.  # CKD- stage III [Dr.Lateef]-GFR 46.  Followed by nephrology.  Check kappa lambda light chain myeloma protein at next visit.  # hx of smoking- quit 25 years ago; declined LCSP.   Thank you Dr.Lateef for allowing me to participate in the care of your pleasant patient. Please do not hesitate to contact me with questions or concerns in the interim.  # 45 minutes face-to-face with the patient discussing the above plan of care; more than 50% of time spent on prognosis/ natural history; counseling and coordination.  # DISPOSITION: late afternoon [after 2 if possible] # IV ferrhem weekly x2.  # follow up in 3 months-labs/cbc/bmp/possible Ferrahem-Dr.B

## 2018-07-05 NOTE — Progress Notes (Signed)
[place] CONSULT NOTE  Patient Care Team: Elby Beck, FNP as PCP - General (Nurse Practitioner) Juanito Doom, MD as Consulting Physician (Pulmonary Disease) Forest Gleason, MD (Inactive) as Consulting Physician (Oncology) Gatha Mayer, MD as Consulting Physician (Gastroenterology) Leandrew Koyanagi, MD as Referring Physician (Ophthalmology)  CHIEF COMPLAINTS/PURPOSE OF CONSULTATION:  Anemia  # ANEMIA- sec to IDA/CKD [Dr.Lateef-hemoglobin 11.5/iron saturation 13% ferritin 30; intolerant to p.o. iron.]# colonoscopy-~ 2016; [not until 2024]  # CKD stage III  # Hx of smoking quit   No history exists.     HISTORY OF PRESENTING ILLNESS:  Kristie Byrd 73 y.o.  female longstanding history of chronic kidney disease stage III followed by nephrology has been referred to [place] for further evaluation recommendations for ongoing anemia.  Patient denies any blood in stools or black-colored stools or blood in urine.  Denies any bone pain.  Denies any tingling or numbness.  Complains of significant fatigue.  Patient was started on p.o. iron however noted to have constipation.  Patient notes to have previous IV iron infusion many years ago.  No reactions noted.  Review of Systems  Constitutional: Positive for malaise/fatigue. Negative for chills, diaphoresis, fever and weight loss.  HENT: Negative for nosebleeds and sore throat.   Eyes: Negative for double vision.  Respiratory: Negative for cough, hemoptysis, sputum production, shortness of breath and wheezing.   Cardiovascular: Negative for chest pain, palpitations, orthopnea and leg swelling.  Gastrointestinal: Positive for constipation. Negative for abdominal pain, blood in stool, diarrhea, heartburn, melena, nausea and vomiting.  Genitourinary: Negative for dysuria, frequency and urgency.  Musculoskeletal: Negative for back pain and joint pain.  Skin: Negative.  Negative for itching and rash.   Neurological: Negative for dizziness, tingling, focal weakness, weakness and headaches.  Endo/Heme/Allergies: Does not bruise/bleed easily.  Psychiatric/Behavioral: Negative for depression. The patient is not nervous/anxious and does not have insomnia.      MEDICAL HISTORY:  Past Medical History:  Diagnosis Date  . Arthritis   . Cancer ([place])    cervix, skin  . Colon polyps   . COPD (chronic obstructive pulmonary disease) ([place])   . Depression   . GERD (gastroesophageal reflux disease)   . Headache   . Hypertension   . Iron deficiency anemia 09/27/2012  . PONV (postoperative nausea and vomiting)   . Shingles     SURGICAL HISTORY: Past Surgical History:  Procedure Laterality Date  . BREAST BIOPSY Bilateral   . CARPAL TUNNEL RELEASE Bilateral   . COLONOSCOPY  multiple  . KNEE ARTHROPLASTY Right 05/12/2016   Procedure: COMPUTER ASSISTED TOTAL KNEE ARTHROPLASTY;  Surgeon: Dereck Leep, MD;  Location: [facility] ORS;  Service: Orthopedics;  Laterality: Right;  . KNEE SURGERY Right   . NOSE SURGERY    . TONSILLECTOMY      SOCIAL HISTORY: Social History   Socioeconomic History  . Marital status: Single    Spouse name: Not on file  . Number of children: 2  . Years of education: Not on file  . Highest education level: Not on file  Occupational History  . Occupation: retired  Scientific laboratory technician  . Financial resource strain: Not on file  . Food insecurity:    Worry: Not on file    Inability: Not on file  . Transportation needs:    Medical: Not on file    Non-medical: Not on file  Tobacco Use  . Smoking status: Former Smoker    Packs/day: 1.00    Years:  37.00    Pack years: 37.00    Types: Cigarettes    Last attempt to quit: 11/15/1996    Years since quitting: 21.6  . Smokeless tobacco: Never Used  Substance and Sexual Activity  . Alcohol use: No    Alcohol/week: 0.0 standard drinks  . Drug use: No    Comment: uses CBD oil   . Sexual activity: Never  Lifestyle  . Physical  activity:    Days per week: Not on file    Minutes per session: Not on file  . Stress: Not on file  Relationships  . Social connections:    Talks on phone: Not on file    Gets together: Not on file    Attends religious service: Not on file    Active member of club or organization: Not on file    Attends meetings of clubs or organizations: Not on file    Relationship status: Not on file  . Intimate partner violence:    Fear of current or ex partner: Not on file    Emotionally abused: Not on file    Physically abused: Not on file    Forced sexual activity: Not on file  Other Topics Concern  . Not on file  Social History Narrative   Lives alone.  Divorced. Two daughters- in 51s, 3 grandchildren.   Does not know family history- raised in [facility].        Desires CPR.  Does not have HPOA.   She would possibly want life support if for short period of time . Unsure about feeding tube.    FAMILY HISTORY: History reviewed. No pertinent family history.  ALLERGIES:  is allergic to latex; other; codeine; demerol [meperidine]; fleet phospho-soda [sodium phosphates]; and prednisone.  MEDICATIONS:  Current Outpatient Medications  Medication Sig Dispense Refill  . acetaminophen (TYLENOL) 650 MG CR tablet Take by mouth.    Lorin Picket 1 GM 210 MG(Fe) tablet   0  . buPROPion (WELLBUTRIN XL) 300 MG 24 hr tablet Take 1 tablet (300 mg total) by mouth daily. 90 tablet 3  . Cholecalciferol (VITAMIN D3) 1000 units CAPS Take 1,000 Units by mouth daily.    . cyanocobalamin (,VITAMIN B-12,) 1000 MCG/ML injection inject 1 milliliter intramuscularly every month 1 mL 3  . lisinopril (PRINIVIL,ZESTRIL) 5 MG tablet Take 1 tablet (5 mg total) by mouth daily. 90 tablet 3  . Multiple Vitamin (MULTIVITAMIN) tablet Take 1 tablet by mouth at bedtime.     . sertraline (ZOLOFT) 50 MG tablet Take 1 tablet (50 mg total) by mouth daily. 90 tablet 3   No current facility-administered medications for this  visit.       Marland Kitchen  PHYSICAL EXAMINATION: ECOG PERFORMANCE STATUS: 1 - Symptomatic but completely ambulatory  Vitals:   07/05/18 1520  BP: (!) 144/77  Pulse: 92  Resp: 16  Temp: (!) 97.2 F (36.2 C)   Filed Weights   07/05/18 1520  Weight: 212 lb 6.4 oz (96.3 kg)    Physical Exam  Constitutional: She is oriented to person, place, and time and well-developed, well-nourished, and in no distress.  HENT:  Head: Normocephalic and atraumatic.  Mouth/Throat: Oropharynx is clear and moist. No oropharyngeal exudate.  Eyes: Pupils are equal, round, and reactive to light.  Neck: Normal range of motion. Neck supple.  Cardiovascular: Normal rate and regular rhythm.  Pulmonary/Chest: No respiratory distress. She has no wheezes.  Abdominal: Soft. Bowel sounds are normal. She exhibits no distension  and no mass. There is no abdominal tenderness. There is no rebound and no guarding.  Musculoskeletal: Normal range of motion.        General: No tenderness or edema.  Neurological: She is alert and oriented to person, place, and time.  Skin: Skin is warm.  Psychiatric: Affect normal.     LABORATORY DATA:  I have reviewed the data as listed Lab Results  Component Value Date   WBC 3.5 (L) 08/19/2017   HGB 11.9 (L) 08/19/2017   HCT 35.2 (L) 08/19/2017   MCV 89.6 08/19/2017   PLT 258.0 08/19/2017   Recent Labs    08/19/17 0938  NA 141  K 4.7  CL 103  CO2 32  GLUCOSE 88  BUN 20  CREATININE 1.18  CALCIUM 9.7  PROT 6.8  ALBUMIN 4.0  AST 19  ALT 15  ALKPHOS 78  BILITOT 0.4    RADIOGRAPHIC STUDIES: I have personally reviewed the radiological images as listed and agreed with the findings in the report. No results found.  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 iron.  Given the ongoing fatigue recommend IV Feraheme x2.  #Discussed the potential IV iron infusion  reactions; extremely rare although serious..  Patient agreeable to proceed with infusions.  # CKD- stage III [Dr.Lateef]-GFR 46.  Followed by nephrology.  Check kappa lambda light chain myeloma protein at next visit.  # hx of smoking- quit 25 years ago; declined LCSP.   Thank you Dr.Lateef for allowing me to participate in the care of your pleasant patient. Please do not hesitate to contact me with questions or concerns in the interim.  # 45 minutes face-to-face with the patient discussing the above plan of care; more than 50% of time spent on prognosis/ natural history; counseling and coordination.  # DISPOSITION: late afternoon [after 2 if possible] # IV ferrhem weekly x2.  # follow up in 3 months-labs/cbc/bmp/possible Ferrahem-Dr.B    All questions were answered. The patient knows to call the clinic with any problems, questions or concerns.    Cammie Sickle, MD 07/05/2018 7:13 PM

## 2018-07-12 ENCOUNTER — Inpatient Hospital Stay: Payer: PPO

## 2018-07-12 VITALS — BP 121/77 | HR 79 | Temp 97.5°F | Resp 18

## 2018-07-12 DIAGNOSIS — N183 Chronic kidney disease, stage 3 (moderate): Secondary | ICD-10-CM | POA: Diagnosis not present

## 2018-07-12 DIAGNOSIS — D631 Anemia in chronic kidney disease: Secondary | ICD-10-CM

## 2018-07-12 MED ORDER — SODIUM CHLORIDE 0.9 % IV SOLN
Freq: Once | INTRAVENOUS | Status: AC
  Administered 2018-07-12: 15:00:00 via INTRAVENOUS
  Filled 2018-07-12: qty 250

## 2018-07-12 MED ORDER — SODIUM CHLORIDE 0.9 % IV SOLN
510.0000 mg | Freq: Once | INTRAVENOUS | Status: AC
  Administered 2018-07-12: 510 mg via INTRAVENOUS
  Filled 2018-07-12: qty 17

## 2018-07-19 ENCOUNTER — Inpatient Hospital Stay: Payer: PPO | Attending: Internal Medicine

## 2018-07-19 VITALS — BP 111/74 | HR 75 | Temp 97.9°F | Resp 18

## 2018-07-19 DIAGNOSIS — D649 Anemia, unspecified: Secondary | ICD-10-CM | POA: Insufficient documentation

## 2018-07-19 DIAGNOSIS — N183 Chronic kidney disease, stage 3 (moderate): Secondary | ICD-10-CM

## 2018-07-19 DIAGNOSIS — D631 Anemia in chronic kidney disease: Secondary | ICD-10-CM

## 2018-07-19 MED ORDER — SODIUM CHLORIDE 0.9 % IV SOLN
Freq: Once | INTRAVENOUS | Status: AC
  Administered 2018-07-19: 15:00:00 via INTRAVENOUS
  Filled 2018-07-19: qty 250

## 2018-07-19 MED ORDER — SODIUM CHLORIDE 0.9 % IV SOLN
510.0000 mg | Freq: Once | INTRAVENOUS | Status: AC
  Administered 2018-07-19: 510 mg via INTRAVENOUS
  Filled 2018-07-19: qty 17

## 2018-08-01 ENCOUNTER — Other Ambulatory Visit: Payer: Self-pay | Admitting: Family Medicine

## 2018-08-01 DIAGNOSIS — F419 Anxiety disorder, unspecified: Secondary | ICD-10-CM

## 2018-08-01 DIAGNOSIS — I10 Essential (primary) hypertension: Secondary | ICD-10-CM

## 2018-08-23 ENCOUNTER — Other Ambulatory Visit: Payer: PPO

## 2018-08-28 ENCOUNTER — Ambulatory Visit (INDEPENDENT_AMBULATORY_CARE_PROVIDER_SITE_OTHER): Payer: PPO

## 2018-08-28 ENCOUNTER — Ambulatory Visit: Payer: PPO

## 2018-08-28 ENCOUNTER — Encounter: Payer: PPO | Admitting: Family Medicine

## 2018-08-28 DIAGNOSIS — Z0000 Encounter for general adult medical examination without abnormal findings: Secondary | ICD-10-CM | POA: Diagnosis not present

## 2018-08-28 NOTE — Progress Notes (Signed)
Virtual Visit via Video Note  I connected with Kristie Byrd on 08/28/18 at 11:00 AM EDT by a video enabled telemedicine application and verified that I am speaking with the correct person using two identifiers.   Patient agreed to complete annual wellness visit via virtual video appointment.   I was in my office and patient was in her home.   Lindell Noe, LPN

## 2018-08-28 NOTE — Patient Instructions (Signed)
Ms. Carino , Thank you for taking time to come for your Medicare Wellness Visit. I appreciate your ongoing commitment to your health goals. Please review the following plan we discussed and let me know if I can assist you in the future.   These are the goals we discussed: Goals    . Increase physical activity     Starting 08/28/2018, I will continue to walk 2000 steps daily and increase to 7000 steps daily when shelter in place is over.        This is a list of the screening recommended for you and due dates:  Health Maintenance  Topic Date Due  . Mammogram  05/17/2019*  . DEXA scan (bone density measurement)  08/22/2048*  . Flu Shot  12/16/2018  . Colon Cancer Screening  02/07/2020  . Tetanus Vaccine  02/19/2025  .  Hepatitis C: One time screening is recommended by Center for Disease Control  (CDC) for  adults born from [birth year] through 1964.   Completed  . Pneumonia vaccines  Completed  *Topic was postponed. The date shown is not the original due date.   Preventive Care for Adults  A healthy lifestyle and preventive care can promote health and wellness. Preventive health guidelines for adults include the following key practices.  . A routine yearly physical is a good way to check with your health care provider about your health and preventive screening. It is a chance to share any concerns and updates on your health and to receive a thorough exam.  . Visit your dentist for a routine exam and preventive care every 6 months. Brush your teeth twice a day and floss once a day. Good oral hygiene prevents tooth decay and gum disease.  . The frequency of eye exams is based on your age, health, family medical history, use  of contact lenses, and other factors. Follow your health care provider's recommendations for frequency of eye exams.  . Eat a healthy diet. Foods like vegetables, fruits, whole grains, low-fat dairy products, and lean protein foods contain the nutrients you need without too  many calories. Decrease your intake of foods high in solid fats, added sugars, and salt. Eat the right amount of calories for you. Get information about a proper diet from your health care provider, if necessary.  . Regular physical exercise is one of the most important things you can do for your health. Most adults should get at least 150 minutes of moderate-intensity exercise (any activity that increases your heart rate and causes you to sweat) each week. In addition, most adults need muscle-strengthening exercises on 2 or more days a week.  Silver Sneakers may be a benefit available to you. To determine eligibility, you may visit the website: www.silversneakers.com or contact program at (613) 677-9952 Mon-Fri between 8AM-8PM.   . Maintain a healthy weight. The body mass index (BMI) is a screening tool to identify possible weight problems. It provides an estimate of body fat based on height and weight. Your health care provider can find your BMI and can help you achieve or maintain a healthy weight.   For adults 20 years and older: ? A BMI below 18.5 is considered underweight. ? A BMI of 18.5 to 24.9 is normal. ? A BMI of 25 to 29.9 is considered overweight. ? A BMI of 30 and above is considered obese.   . Maintain normal blood lipids and cholesterol levels by exercising and minimizing your intake of saturated fat. Eat a balanced diet with plenty  of fruit and vegetables. Blood tests for lipids and cholesterol should begin at age 22 and be repeated every 5 years. If your lipid or cholesterol levels are high, you are over 50, or you are at high risk for heart disease, you may need your cholesterol levels checked more frequently. Ongoing high lipid and cholesterol levels should be treated with medicines if diet and exercise are not working.  . If you smoke, find out from your health care provider how to quit. If you do not use tobacco, please do not start.  . If you choose to drink alcohol, please  do not consume more than 2 drinks per day. One drink is considered to be 12 ounces (355 mL) of beer, 5 ounces (148 mL) of wine, or 1.5 ounces (44 mL) of liquor.  . If you are 61-74 years old, ask your health care provider if you should take aspirin to prevent strokes.  . Use sunscreen. Apply sunscreen liberally and repeatedly throughout the day. You should seek shade when your shadow is shorter than you. Protect yourself by wearing long sleeves, pants, a wide-brimmed hat, and sunglasses year round, whenever you are outdoors.  . Once a month, do a whole body skin exam, using a mirror to look at the skin on your back. Tell your health care provider of new moles, moles that have irregular borders, moles that are larger than a pencil eraser, or moles that have changed in shape or color.

## 2018-08-28 NOTE — Progress Notes (Signed)
PCP notes:   Health maintenance:  Mammogram - PCP follow-up requested  Abnormal screenings:   None  Patient concerns:   Monthly B12 injection - patient wants to obtain injection at office; pcp made aware  Mammogram referral needed to [facility]  Sleep study referral request from patient due to "heavy snoring"  Nurse concerns:  None  Next PCP appt:   10/20/2018 @ 1430

## 2018-08-28 NOTE — Progress Notes (Signed)
Subjective:   Kristie Byrd is a 72 y.o. female who presents for Medicare Annual (Subsequent) preventive examination.  Review of Systems:  N/A Cardiac Risk Factors include: advanced age (>14men, >41 women);hypertension;obesity (BMI >30kg/m2)     Objective:     Vitals: There were no vitals taken for this visit.  There is no height or weight on file to calculate BMI.  Advanced Directives 08/28/2018 08/22/2017 11/30/2016 07/05/2016 05/12/2016 05/12/2016 04/28/2016  Does Patient Have a Medical Advance Directive? No No No No No No No  Would patient like information on creating a medical advance directive? Yes (MAU/Ambulatory/Procedural Areas - Information given) No - Patient declined - - No - Patient declined No - Patient declined No - Patient declined    Tobacco Social History   Tobacco Use  Smoking Status Former Smoker  . Packs/day: 1.00  . Years: 37.00  . Pack years: 37.00  . Types: Cigarettes  . Last attempt to quit: 11/15/1996  . Years since quitting: 21.7  Smokeless Tobacco Never Used     Counseling given: No   Clinical Intake:  Pre-visit preparation completed: Yes  Pain : No/denies pain Pain Score: 0-No pain     Nutritional Risks: None  How often do you need to have someone help you when you read instructions, pamphlets, or other written materials from your doctor or pharmacy?: 1 - Never What is the last grade level you completed in school?: 12th grade + some college courses  Interpreter Needed?: No  Comments: pt lives independently Information entered by :: LPinson, LPN  Past Medical History:  Diagnosis Date  . Arthritis   . Cancer ([place])    cervix, skin  . Colon polyps   . COPD (chronic obstructive pulmonary disease) ([place])   . Depression   . GERD (gastroesophageal reflux disease)   . Headache   . Hypertension   . Iron deficiency anemia 09/27/2012  . PONV (postoperative nausea and vomiting)   . Shingles    Past Surgical History:  Procedure  Laterality Date  . BREAST BIOPSY Bilateral   . CARPAL TUNNEL RELEASE Bilateral   . COLONOSCOPY  multiple  . KNEE ARTHROPLASTY Right 05/12/2016   Procedure: COMPUTER ASSISTED TOTAL KNEE ARTHROPLASTY;  Surgeon: Dereck Leep, MD;  Location: [facility] ORS;  Service: Orthopedics;  Laterality: Right;  . KNEE SURGERY Right   . NOSE SURGERY    . TONSILLECTOMY     History reviewed. No pertinent family history. Social History   Socioeconomic History  . Marital status: Single    Spouse name: Not on file  . Number of children: 2  . Years of education: Not on file  . Highest education level: Not on file  Occupational History  . Occupation: retired  Scientific laboratory technician  . Financial resource strain: Not on file  . Food insecurity:    Worry: Not on file    Inability: Not on file  . Transportation needs:    Medical: Not on file    Non-medical: Not on file  Tobacco Use  . Smoking status: Former Smoker    Packs/day: 1.00    Years: 37.00    Pack years: 37.00    Types: Cigarettes    Last attempt to quit: 11/15/1996    Years since quitting: 21.7  . Smokeless tobacco: Never Used  Substance and Sexual Activity  . Alcohol use: No    Alcohol/week: 0.0 standard drinks  . Drug use: No    Comment: uses CBD oil   .  Sexual activity: Never  Lifestyle  . Physical activity:    Days per week: Not on file    Minutes per session: Not on file  . Stress: Not on file  Relationships  . Social connections:    Talks on phone: Not on file    Gets together: Not on file    Attends religious service: Not on file    Active member of club or organization: Not on file    Attends meetings of clubs or organizations: Not on file    Relationship status: Not on file  Other Topics Concern  . Not on file  Social History Narrative   Lives alone.  Divorced. Two daughters- in 81s, 3 grandchildren.   Does not know family history- raised in [facility].        Desires CPR.  Does not have HPOA.   She would possibly  want life support if for short period of time . Unsure about feeding tube.    Outpatient Encounter Medications as of 08/28/2018  Medication Sig  . acetaminophen (TYLENOL) 650 MG CR tablet Take by mouth.  Marland Kitchen buPROPion (WELLBUTRIN XL) 300 MG 24 hr tablet TAKE 1 TABLET BY MOUTH ONCE DAILY.  Marland Kitchen Cholecalciferol (VITAMIN D3) 1000 units CAPS Take 1,000 Units by mouth daily.  . cyanocobalamin (,VITAMIN B-12,) 1000 MCG/ML injection inject 1 milliliter intramuscularly every month  . lisinopril (PRINIVIL,ZESTRIL) 5 MG tablet TAKE 1 TABLET BY MOUTH ONCE DAILY.  . Multiple Vitamin (MULTIVITAMIN) tablet Take 1 tablet by mouth at bedtime.   . sertraline (ZOLOFT) 50 MG tablet Take 1 tablet (50 mg total) by mouth daily.  . [DISCONTINUED] AURYXIA 1 GM 210 MG(Fe) tablet    No facility-administered encounter medications on file as of 08/28/2018.     Activities of Daily Living In your present state of health, do you have any difficulty performing the following activities: 08/28/2018  Hearing? N  Vision? Y  Difficulty concentrating or making decisions? N  Walking or climbing stairs? N  Dressing or bathing? N  Doing errands, shopping? N  Preparing Food and eating ? N  Using the Toilet? N  In the past six months, have you accidently leaked urine? Y  Do you have problems with loss of bowel control? N  Managing your Medications? N  Managing your Finances? N  Housekeeping or managing your Housekeeping? N  Some recent data might be hidden    Patient Care Team: Elby Beck, FNP as PCP - General (Nurse Practitioner) Juanito Doom, MD as Consulting Physician (Pulmonary Disease) Forest Gleason, MD (Inactive) as Consulting Physician (Oncology) Gatha Mayer, MD as Consulting Physician (Gastroenterology) Leandrew Koyanagi, MD as Referring Physician (Ophthalmology)    Assessment:   This is a routine wellness examination for Kristie Byrd.  Vision Screening Comments: Last vision exam in 2019 with Dr.  Wallace Going   Exercise Activities and Dietary recommendations Current Exercise Habits: Home exercise routine, Type of exercise: walking, Time (Minutes): 30, Frequency (Times/Week): 7, Weekly Exercise (Minutes/Week): 210, Intensity: Mild, Exercise limited by: None identified  Goals    . Increase physical activity     Starting 08/28/2018, I will continue to walk 2000 steps daily and increase to 7000 steps daily when shelter in place is over.        Fall Risk Fall Risk  08/28/2018 07/12/2018 08/22/2017 11/30/2016 07/05/2016  Falls in the past year? 0 0 No Yes No  Number falls in past yr: - - - 1 -  Injury with  Fall? - - - No -   Depression Screen PHQ 2/9 Scores 08/28/2018 11/30/2016 07/05/2016 05/27/2015  PHQ - 2 Score 0 0 0 0  PHQ- 9 Score 0 - - -     Cognitive Function MMSE - Mini Mental State Exam 08/28/2018 08/22/2017 07/05/2016  Orientation to time 5 5 5   Orientation to Place 5 5 5   Registration 3 3 3   Attention/ Calculation 0 0 0  Recall 3 3 3   Language- name 2 objects 0 0 0  Language- repeat 1 1 1   Language- follow 3 step command 0 3 3  Language- read & follow direction 0 0 0  Write a sentence 0 0 0  Copy design 0 0 0  Total score 17 20 20      PLEASE NOTE: A Mini-Cog screen was completed. Maximum score is 17. A value of 0 denotes this part of [place] MMSE was not completed or the patient failed this part of the Mini-Cog screening.   Mini-Cog Screening Orientation to Time - Max 5 pts Orientation to Place - Max 5 pts Registration - Max 3 pts Recall - Max 3 pts Language Repeat - Max 1 pts     Immunization History  Administered Date(s) Administered  . Influenza Split 02/15/2013  . Influenza, High Dose Seasonal PF 02/02/2014, 01/21/2015, 01/25/2017, 07/15/2018  . Influenza, Seasonal, Injecte, Preservative Fre 01/02/2016  . Influenza-Unspecified 01/25/2017  . Pneumococcal Conjugate-13 02/02/2014  . Pneumococcal Polysaccharide-23 01/25/2017  . Pneumococcal-Unspecified  02/19/2013  . Td 11/05/2013  . Tdap 02/20/2015  . Zoster 12/27/2012  . Zoster Recombinat (Shingrix) 01/25/2017    Screening Tests Health Maintenance  Topic Date Due  . MAMMOGRAM  05/17/2019 (Originally 11/15/2015)  . DEXA SCAN  08/22/2048 (Originally 10/10/2011)  . INFLUENZA VACCINE  12/16/2018  . COLONOSCOPY  02/07/2020  . TETANUS/TDAP  02/19/2025  . Hepatitis C Screening  Completed  . PNA vac Low Risk Adult  Completed      Plan:     I have personally reviewed, addressed, and noted the following in the patient's chart:  A. Medical and social history B. Use of alcohol, tobacco or illicit drugs  C. Current medications and supplements D. Functional ability and status E.  Nutritional status F.  Physical activity G. Advance directives H. List of other physicians I.  Hospitalizations, surgeries, and ER visits in previous 12 months J.  [place] to include hearing, vision, cognitive, depression L. Referrals and appointments - none  In addition, I have reviewed and discussed with patient certain preventive protocols, quality metrics, and best practice recommendations. A written personalized care plan for preventive services as well as general preventive health recommendations were provided to patient.  See attached scanned questionnaire for additional information.   Signed,   Lindell Noe, MHA, BS, LPN Health Coach

## 2018-08-29 ENCOUNTER — Ambulatory Visit (INDEPENDENT_AMBULATORY_CARE_PROVIDER_SITE_OTHER): Payer: PPO

## 2018-08-29 ENCOUNTER — Other Ambulatory Visit: Payer: Self-pay

## 2018-08-29 ENCOUNTER — Ambulatory Visit: Payer: PPO

## 2018-08-29 DIAGNOSIS — E538 Deficiency of other specified B group vitamins: Secondary | ICD-10-CM

## 2018-08-29 MED ORDER — CYANOCOBALAMIN 1000 MCG/ML IJ SOLN
1000.0000 ug | Freq: Once | INTRAMUSCULAR | Status: AC
  Administered 2018-08-29: 1000 ug via INTRAMUSCULAR

## 2018-08-29 NOTE — Progress Notes (Signed)
Pt given monthly B12 injection in Right Deltoid. Tolerated well.   Will have KAte Clark sign in [place] absence.

## 2018-08-31 ENCOUNTER — Ambulatory Visit: Payer: PPO

## 2018-09-04 ENCOUNTER — Encounter: Payer: Self-pay | Admitting: Family Medicine

## 2018-09-04 NOTE — Progress Notes (Signed)
I reviewed health advisor's note, was available for consultation, and agree with documentation and plan.  

## 2018-09-28 DIAGNOSIS — I10 Essential (primary) hypertension: Secondary | ICD-10-CM | POA: Diagnosis not present

## 2018-09-28 DIAGNOSIS — N2581 Secondary hyperparathyroidism of renal origin: Secondary | ICD-10-CM | POA: Diagnosis not present

## 2018-09-28 DIAGNOSIS — D631 Anemia in chronic kidney disease: Secondary | ICD-10-CM | POA: Diagnosis not present

## 2018-09-28 DIAGNOSIS — N183 Chronic kidney disease, stage 3 (moderate): Secondary | ICD-10-CM | POA: Diagnosis not present

## 2018-10-03 ENCOUNTER — Telehealth: Payer: Self-pay | Admitting: Internal Medicine

## 2018-10-03 ENCOUNTER — Other Ambulatory Visit: Payer: Self-pay

## 2018-10-04 ENCOUNTER — Other Ambulatory Visit: Payer: Self-pay

## 2018-10-04 ENCOUNTER — Inpatient Hospital Stay: Payer: PPO

## 2018-10-04 ENCOUNTER — Encounter: Payer: Self-pay | Admitting: Internal Medicine

## 2018-10-04 ENCOUNTER — Inpatient Hospital Stay (HOSPITAL_BASED_OUTPATIENT_CLINIC_OR_DEPARTMENT_OTHER): Payer: PPO | Admitting: Internal Medicine

## 2018-10-04 ENCOUNTER — Inpatient Hospital Stay: Payer: PPO | Attending: Internal Medicine

## 2018-10-04 VITALS — BP 128/81 | HR 76 | Temp 98.1°F | Resp 20

## 2018-10-04 DIAGNOSIS — D631 Anemia in chronic kidney disease: Secondary | ICD-10-CM

## 2018-10-04 DIAGNOSIS — N183 Chronic kidney disease, stage 3 unspecified: Secondary | ICD-10-CM

## 2018-10-04 DIAGNOSIS — D509 Iron deficiency anemia, unspecified: Secondary | ICD-10-CM | POA: Diagnosis not present

## 2018-10-04 DIAGNOSIS — D649 Anemia, unspecified: Secondary | ICD-10-CM

## 2018-10-04 DIAGNOSIS — Z79899 Other long term (current) drug therapy: Secondary | ICD-10-CM | POA: Diagnosis not present

## 2018-10-04 DIAGNOSIS — Z87891 Personal history of nicotine dependence: Secondary | ICD-10-CM | POA: Diagnosis not present

## 2018-10-04 LAB — CBC WITH DIFFERENTIAL/PLATELET
Abs Immature Granulocytes: 0.01 10*3/uL (ref 0.00–0.07)
Basophils Absolute: 0 10*3/uL (ref 0.0–0.1)
Basophils Relative: 1 %
Eosinophils Absolute: 0 10*3/uL (ref 0.0–0.5)
Eosinophils Relative: 1 %
HCT: 37 % (ref 36.0–46.0)
Hemoglobin: 12 g/dL (ref 12.0–15.0)
Immature Granulocytes: 0 %
Lymphocytes Relative: 18 %
Lymphs Abs: 1 10*3/uL (ref 0.7–4.0)
MCH: 30.5 pg (ref 26.0–34.0)
MCHC: 32.4 g/dL (ref 30.0–36.0)
MCV: 94.1 fL (ref 80.0–100.0)
Monocytes Absolute: 0.5 10*3/uL (ref 0.1–1.0)
Monocytes Relative: 10 %
Neutro Abs: 3.9 10*3/uL (ref 1.7–7.7)
Neutrophils Relative %: 70 %
Platelets: 290 10*3/uL (ref 150–400)
RBC: 3.93 MIL/uL (ref 3.87–5.11)
RDW: 13.6 % (ref 11.5–15.5)
WBC: 5.5 10*3/uL (ref 4.0–10.5)
nRBC: 0 % (ref 0.0–0.2)

## 2018-10-04 LAB — BASIC METABOLIC PANEL
Anion gap: 10 (ref 5–15)
BUN: 20 mg/dL (ref 8–23)
CO2: 25 mmol/L (ref 22–32)
Calcium: 9.2 mg/dL (ref 8.9–10.3)
Chloride: 102 mmol/L (ref 98–111)
Creatinine, Ser: 1.15 mg/dL — ABNORMAL HIGH (ref 0.44–1.00)
GFR calc Af Amer: 55 mL/min — ABNORMAL LOW (ref >60)
GFR calc non Af Amer: 48 mL/min — ABNORMAL LOW (ref >60)
Glucose, Bld: 115 mg/dL — ABNORMAL HIGH (ref 70–99)
Potassium: 4.4 mmol/L (ref 3.5–5.1)
Sodium: 137 mmol/L (ref 135–145)

## 2018-10-04 MED ORDER — SODIUM CHLORIDE 0.9 % IV SOLN
Freq: Once | INTRAVENOUS | Status: AC
  Administered 2018-10-04: 14:00:00 via INTRAVENOUS
  Filled 2018-10-04: qty 250

## 2018-10-04 MED ORDER — SODIUM CHLORIDE 0.9 % IV SOLN
510.0000 mg | Freq: Once | INTRAVENOUS | Status: AC
  Administered 2018-10-04: 510 mg via INTRAVENOUS
  Filled 2018-10-04: qty 17

## 2018-10-04 NOTE — Progress Notes (Signed)
I connected with Kristie Byrd on 10/04/2018 at 11:00 AM EDT by telephone visit and verified that I am speaking with the correct person using two identifiers.  I discussed the limitations, risks, security and privacy concerns of performing an evaluation and management service by telemedicine and the availability of in-person appointments. I also discussed with the patient that there may be a patient responsible charge related to this service. The patient expressed understanding and agreed to proceed.    Other persons participating in the visit and their role in the encounter: none Patient's location: home  Provider's location: home    No history exists.     Chief Complaint: anemia    History of present illness:Kristie Byrd 73 y.o.  female with history of anemia iron deficiency CKD is here for follow-up.  Patient admits to mild to moderate fatigue especially exertion.  Otherwise denies any blood in stool or black or stools.  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  Intolerant to p.o. iron. Given the ongoing fatigue-recommend proceeding with IV infusion.  Patient however reluctant with IV Feraheme because of the cost of the infusions.  # CKD- stage III [Dr.Lateef]-GFR 46.  Followed by nephrology.  Await myeloma numbers.  # hx of smoking- quit 25 years ago; declined LCSP.   # DISPOSITION: # IV ferrhem today.  # follow up in 6 months-labs/cbc/bmp/possible Ferrahem-Dr.B      Follow-up instructions:  I discussed the assessment and treatment plan with the patient.  The patient was provided an opportunity to ask questions and all were answered.  The patient agreed with the plan and demonstrated understanding of instructions.  The patient was advised to call back or seek an in person evaluation if the symptoms worsen or if the condition fails to  improve as anticipated.  I provided 12 minutes of non face-to-face telephone visit time during this encounter, and > 50% was spent counseling as documented under my assessment & plan.   Dr. Charlaine Dalton Liscomb at [facility] 10/05/2018 11:05 AM

## 2018-10-04 NOTE — Assessment & Plan Note (Addendum)
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  Intolerant to p.o. iron. Given the ongoing fatigue-recommend proceeding with IV infusion.  Patient however reluctant with IV Feraheme because of the cost of the infusions.  # CKD- stage III [Dr.Lateef]-GFR 46.  Followed by nephrology.  Await myeloma numbers.  # hx of smoking- quit 25 years ago; declined LCSP.   # DISPOSITION: # IV ferrhem today.  # follow up in 6 months-labs/cbc/bmp/possible Ferrahem-Dr.B

## 2018-10-05 LAB — MULTIPLE MYELOMA PANEL, SERUM
Albumin SerPl Elph-Mcnc: 3.4 g/dL (ref 2.9–4.4)
Albumin/Glob SerPl: 1.2 (ref 0.7–1.7)
Alpha 1: 0.2 g/dL (ref 0.0–0.4)
Alpha2 Glob SerPl Elph-Mcnc: 0.7 g/dL (ref 0.4–1.0)
B-Globulin SerPl Elph-Mcnc: 1 g/dL (ref 0.7–1.3)
Gamma Glob SerPl Elph-Mcnc: 1.1 g/dL (ref 0.4–1.8)
Globulin, Total: 3 g/dL (ref 2.2–3.9)
IgA: 179 mg/dL (ref 64–422)
IgG (Immunoglobin G), Serum: 1189 mg/dL (ref 586–1602)
IgM (Immunoglobulin M), Srm: 156 mg/dL (ref 26–217)
Total Protein ELP: 6.4 g/dL (ref 6.0–8.5)

## 2018-10-05 LAB — KAPPA/LAMBDA LIGHT CHAINS
Kappa free light chain: 31 mg/L — ABNORMAL HIGH (ref 3.3–19.4)
Kappa, lambda light chain ratio: 1.04 (ref 0.26–1.65)
Lambda free light chains: 29.9 mg/L — ABNORMAL HIGH (ref 5.7–26.3)

## 2018-10-10 ENCOUNTER — Telehealth: Payer: Self-pay

## 2018-10-10 NOTE — Telephone Encounter (Signed)
LVM not to come in for lab appt 5.29.20 as no labs are needed at this time per Clarene Reamer

## 2018-10-11 ENCOUNTER — Other Ambulatory Visit: Payer: Self-pay | Admitting: Family Medicine

## 2018-10-12 DIAGNOSIS — N183 Chronic kidney disease, stage 3 (moderate): Secondary | ICD-10-CM | POA: Diagnosis not present

## 2018-10-12 DIAGNOSIS — D631 Anemia in chronic kidney disease: Secondary | ICD-10-CM | POA: Diagnosis not present

## 2018-10-13 ENCOUNTER — Other Ambulatory Visit: Payer: PPO

## 2018-10-20 ENCOUNTER — Encounter: Payer: PPO | Admitting: Family Medicine

## 2018-10-23 ENCOUNTER — Encounter: Payer: PPO | Admitting: Family Medicine

## 2018-11-10 ENCOUNTER — Ambulatory Visit (INDEPENDENT_AMBULATORY_CARE_PROVIDER_SITE_OTHER): Payer: PPO | Admitting: Family Medicine

## 2018-11-10 ENCOUNTER — Telehealth: Payer: Self-pay | Admitting: Family Medicine

## 2018-11-10 DIAGNOSIS — Z01419 Encounter for gynecological examination (general) (routine) without abnormal findings: Secondary | ICD-10-CM

## 2018-11-10 DIAGNOSIS — R251 Tremor, unspecified: Secondary | ICD-10-CM | POA: Diagnosis not present

## 2018-11-10 NOTE — Progress Notes (Signed)
Virtual Visit via Video Note  I connected with Kristie Byrd on 11/10/18 at 10:30 AM EDT by a video enabled telemedicine application and verified that I am speaking with the correct person using two identifiers.  Location: Patient: in her home Provider: [place]   I discussed the limitations of evaluation and management by telemedicine and the availability of in person appointments. The patient expressed understanding and agreed to proceed.  History of Present Illness: This is a 73 yo female who requests virtual visit for well woman exam. She has been doing well. Working from home. Not going out, ordering groceries online.   Last CPE- last year Mammo- 11/14/2013- she declines further screening Pap- 07/14/2016, negative, negative HPV, declines further screening Colonoscopy- 02/06/2013 Tdap- 11/05/2013 Flu- annual Eye- regular, needs cataract surgery Dental- regular Exercise- some walking  Review of Systems  Constitutional: Negative.   HENT: Positive for hearing loss (chronic).   Eyes: Negative.   Respiratory: Negative.   Cardiovascular: Negative.   Gastrointestinal: Negative.   Genitourinary: Negative.   Musculoskeletal: Negative.   Skin: Negative.   Neurological: Positive for tremors (ongoing, left>right, seems to be getting a little worse, not every day. Not interested in taking a medication. Does not affect her ability to do ADLs. ).  Endo/Heme/Allergies: Negative.   Psychiatric/Behavioral: Negative.      Past Medical History:  Diagnosis Date  . Arthritis   . Cancer ([place])    cervix, skin  . Colon polyps   . COPD (chronic obstructive pulmonary disease) ([place])   . Depression   . GERD (gastroesophageal reflux disease)   . Headache   . Hypertension   . Iron deficiency anemia 09/27/2012  . PONV (postoperative nausea and vomiting)   . Shingles    Past Surgical History:  Procedure Laterality Date  . BREAST BIOPSY Bilateral   . CARPAL TUNNEL RELEASE Bilateral    . COLONOSCOPY  multiple  . KNEE ARTHROPLASTY Right 05/12/2016   Procedure: COMPUTER ASSISTED TOTAL KNEE ARTHROPLASTY;  Surgeon: Dereck Leep, MD;  Location: [facility] ORS;  Service: Orthopedics;  Laterality: Right;  . KNEE SURGERY Right   . NOSE SURGERY    . TONSILLECTOMY     No family history on file. Social History   Tobacco Use  . Smoking status: Former Smoker    Packs/day: 1.00    Years: 37.00    Pack years: 37.00    Types: Cigarettes    Quit date: 11/15/1996    Years since quitting: 22.0  . Smokeless tobacco: Never Used  Substance Use Topics  . Alcohol use: No    Alcohol/week: 0.0 standard drinks  . Drug use: No    Comment: uses CBD oil       Observations/Objective: The patient is alert and answers questions appropriately. Visible skin is unremarkable. She is normally conversive without shortness of breath. Mood and affect are appropriate.   There were no vitals taken for this visit. Wt Readings from Last 3 Encounters:  07/05/18 212 lb 6.4 oz (96.3 kg)  08/22/17 207 lb 4 oz (94 kg)  08/22/17 207 lb 4 oz (94 kg)   BP Readings from Last 3 Encounters:  10/04/18 128/81  07/19/18 111/74  07/12/18 121/77   Depression screen PHQ 2/9 08/28/2018 11/30/2016 07/05/2016 05/27/2015 09/27/2012  Decreased Interest 0 0 0 0 2  Down, Depressed, Hopeless 0 0 0 0 0  PHQ - 2 Score 0 0 0 0 2  Altered sleeping 0 - - - -  Tired,  decreased energy 0 - - - -  Change in appetite 0 - - - -  Feeling bad or failure about yourself  0 - - - -  Trouble concentrating 0 - - - -  Moving slowly or fidgety/restless 0 - - - -  Suicidal thoughts 0 - - - -  PHQ-9 Score 0 - - - -  Difficult doing work/chores Not difficult at all - - - -    Assessment and Plan: 1. Well woman exam - Discussed and encouraged healthy lifestyle choices- adequate sleep, regular exercise, stress management and healthy food choices.   2. Tremor - it is intermittent in nature and the patient is not interested in daily  medication at this time - continue to monitor for worsening symptoms, interference with daily activities   Clarene Reamer, FNP-BC  [facility] at [facility], [place]  11/12/2018 11:49 AM   Follow Up Instructions:    I discussed the assessment and treatment plan with the patient. The patient was provided an opportunity to ask questions and all were answered. The patient agreed with the plan and demonstrated an understanding of the instructions.   The patient was advised to call back or seek an in-person evaluation if the symptoms worsen or if the condition fails to improve as anticipated.   Elby Beck, FNP

## 2018-11-10 NOTE — Telephone Encounter (Signed)
Relation to pt: self all back number:[phone] Pharmacy: [place], [place]. [phone] (Phone) [phone] (Fax)  Reason for call: Patient checking on the status of tremor medication, (rx unknown) follow up from 11/10/2018 virtual visit , please advise

## 2018-11-12 ENCOUNTER — Encounter: Payer: Self-pay | Admitting: Family Medicine

## 2018-11-14 ENCOUNTER — Other Ambulatory Visit: Payer: Self-pay | Admitting: Family Medicine

## 2018-11-14 DIAGNOSIS — R251 Tremor, unspecified: Secondary | ICD-10-CM

## 2018-11-14 MED ORDER — PROPRANOLOL HCL 40 MG PO TABS: 40.0000 mg | ORAL_TABLET | Freq: Two times a day (BID) | ORAL | 2 refills | Status: DC

## 2018-11-14 NOTE — Progress Notes (Signed)
Propranolol sent to patient's pharmacy for tremor.

## 2018-11-14 NOTE — Telephone Encounter (Signed)
Patient advised.

## 2018-11-14 NOTE — Telephone Encounter (Signed)
Please call patient and tell her that I have sent in a medication for her tremor. It is propranolol that she will take twice a day. I have started her at a low dose and would like her to follow up in 3 months, sooner if no improvement or if problems with medication. Please tell her that I misunderstood and didn't think she wanted to start medication when I spoke with her last week that is why I did not send in a prescription.

## 2018-11-30 ENCOUNTER — Encounter: Payer: Self-pay | Admitting: Family Medicine

## 2018-11-30 ENCOUNTER — Ambulatory Visit (INDEPENDENT_AMBULATORY_CARE_PROVIDER_SITE_OTHER): Payer: PPO | Admitting: Family Medicine

## 2018-11-30 ENCOUNTER — Telehealth: Payer: Self-pay

## 2018-11-30 VITALS — Temp 97.9°F | Wt 230.0 lb

## 2018-11-30 DIAGNOSIS — S76011A Strain of muscle, fascia and tendon of right hip, initial encounter: Secondary | ICD-10-CM

## 2018-11-30 NOTE — Telephone Encounter (Signed)
On 11/28/18 pt was moving blocks outside; then pain in lower back at rt buttocks started and radiates down rt leg. Standing causes pain to be worse in rt leg. If pt is sitting and not moving around no pain. Pt has been alternating ice and heat but no better. Pt is supposed to work on 12/01/18. Pt said the pain is so bad that she cannot drive a car and has no one that can bring her to [facility]. No covid symptoms, no travel and no known exposure to + covid. Pt scheduled virtual visit today at 12:noon.

## 2018-11-30 NOTE — Progress Notes (Signed)
I connected with [name] on 11/30/18 at 12:00 PM EDT by video and verified that I am speaking with the correct person using two identifiers.   I discussed the limitations, risks, security and privacy concerns of performing an evaluation and management service by video and the availability of in person appointments. I also discussed with the patient that there may be a patient responsible charge related to this service. The patient expressed understanding and agreed to proceed.  Patient location: Home Provider Location: [place] Participants: Lesleigh Noe and [name]   Subjective:     Kristie Byrd is a 73 y.o. female presenting for Back Pain (started on 11/28/2018 after moving some blocks outside-doing some yard work. Pain is in the right buttocks area and low back. Does not radiate to her leg. no pain until weight bearing.)     Back Pain This is a new problem. The current episode started in the past 7 days. The problem has been waxing and waning since onset. The pain is present in the gluteal (right side). The pain does not radiate. The pain is moderate. The symptoms are aggravated by standing and twisting. Pertinent negatives include no abdominal pain, bladder incontinence, bowel incontinence, chest pain, fever, leg pain, numbness, tingling or weakness. She has tried heat, ice and analgesics (tylenol) for the symptoms. The treatment provided mild relief.   Pain not worse with pressing on the area Is able to lift her leg, but does get some pain with movement  Review of Systems  Constitutional: Negative for fever.  Cardiovascular: Negative for chest pain.  Gastrointestinal: Negative for abdominal pain and bowel incontinence.  Genitourinary: Negative for bladder incontinence.  Musculoskeletal: Positive for back pain.  Neurological: Negative for tingling, weakness and numbness.     Social History   Tobacco Use  Smoking Status Former Smoker  .  Packs/day: 1.00  . Years: 37.00  . Pack years: 37.00  . Types: Cigarettes  . Quit date: 11/15/1996  . Years since quitting: 22.0  Smokeless Tobacco Never Used        Objective:   BP Readings from Last 3 Encounters:  10/04/18 128/81  07/19/18 111/74  07/12/18 121/77   Wt Readings from Last 3 Encounters:  11/30/18 230 lb (104.3 kg)  07/05/18 212 lb 6.4 oz (96.3 kg)  08/22/17 207 lb 4 oz (94 kg)    Temp 97.9 F (36.6 C) Comment: per patient  Wt 230 lb (104.3 kg) Comment: per patient  BMI 40.42 kg/m    Physical Exam Constitutional:      Appearance: Normal appearance. She is not ill-appearing.  HENT:     Head: Normocephalic and atraumatic.     Right Ear: External ear normal.     Left Ear: External ear normal.  Eyes:     Conjunctiva/sclera: Conjunctivae normal.  Pulmonary:     Effort: Pulmonary effort is normal. No respiratory distress.  Musculoskeletal:     Comments: Seated position. Able to lift legs  Neurological:     Mental Status: She is alert. Mental status is at baseline.  Psychiatric:        Mood and Affect: Mood normal.        Behavior: Behavior normal.        Thought Content: Thought content normal.        Judgment: Judgment normal.            Assessment & Plan:   Problem List Items Addressed This  Visit    None    Visit Diagnoses    Muscle strain of right gluteal region, initial encounter    -  Primary     Exam limited by virtual visit but hx reassuring and not worse with palpation so low suspicion for fracture.   Advised tylenol and capsaicin cream due to kidney functioning  Also provided some stretches via AVS which patient will access through [place]   Return if symptoms worsen or fail to improve.  Lesleigh Noe, MD

## 2018-11-30 NOTE — Telephone Encounter (Signed)
See visit note

## 2018-11-30 NOTE — Patient Instructions (Signed)
#  Buttock pain - likely a muscle strain  -- Try Tylenol up to 1000 mg every 8 hours  -- Try Capsaicin Cream -- topical pain relief  --- May not be as safe with kidneys but could also try Voltaren Gel -- which is a topical anti-inflammatory medication. Would not use for more than 3-4 days  I don't think you have sciatica based on what you've told me, but these stretches will work on the same area  [email or web address]  Let me know if you cannot access the links. There are videos and descriptions.

## 2018-12-03 ENCOUNTER — Other Ambulatory Visit: Payer: Self-pay

## 2018-12-03 ENCOUNTER — Ambulatory Visit
Admission: EM | Admit: 2018-12-03 | Discharge: 2018-12-03 | Disposition: A | Discharge status: Home / Self Care | Payer: PPO | Attending: Family Medicine | Admitting: Family Medicine

## 2018-12-03 DIAGNOSIS — M5441 Lumbago with sciatica, right side: Secondary | ICD-10-CM

## 2018-12-03 MED ORDER — TRAMADOL HCL 50 MG PO TABS: 50.0000 mg | ORAL_TABLET | Freq: Two times a day (BID) | ORAL | 0 refills | Status: DC | PRN

## 2018-12-03 MED ORDER — BACLOFEN 10 MG PO TABS: 10.0000 mg | ORAL_TABLET | Freq: Three times a day (TID) | ORAL | 0 refills | Status: DC

## 2018-12-03 NOTE — ED Provider Notes (Signed)
[facility]    CSN: [id number] Arrival date & time: 12/03/18  1418  History   Chief Complaint Chief Complaint  Patient presents with  . Back Pain   HPI  73 year old female presents with low back pain.  Patient states that she was doing work outside earlier this week.  Subsequently developed low back pain on Wednesday.  She states that it has been worsening since that time.  She called her primary care provider as she suggested use of anti-inflammatories but patient was concerned given known CKD.  Patient presents for evaluation and requests medication given her severe low back pain.  Pain is located on the right side.  She reports radiation down her right leg.  No fever.  No chills.  Worse with activity.  Better with rest and heat.  No other medications or interventions tried.  No other complaints.  History reviewed and updated as below.  Past Medical History:  Diagnosis Date  . Arthritis   . Cancer ([place])    cervix, skin  . Colon polyps   . COPD (chronic obstructive pulmonary disease) ([place])   . Depression   . GERD (gastroesophageal reflux disease)   . Headache   . Hypertension   . Iron deficiency anemia 09/27/2012  . PONV (postoperative nausea and vomiting)   . Shingles     Patient Active Problem List   Diagnosis Date Noted  . Normocytic anemia 07/05/2018  . Anemia of chronic kidney failure, stage 3 (moderate) ([place]) 07/05/2018  . Renal insufficiency 07/14/2016  . Status post total right knee replacement 06/28/2016  . Anxiety 06/16/2016  . S/P total knee arthroplasty 05/12/2016  . Primary osteoarthritis of right knee 03/30/2016  . Well woman exam 05/27/2015  . Failed hearing screening 05/27/2015  . COPD (chronic obstructive pulmonary disease) ([place]) 11/05/2013  . Iron deficiency anemia 09/27/2012  . GERD (gastroesophageal reflux disease) 08/16/2012  . Hypertension   . Personal history of colonic adenoma 04/07/2001    Past Surgical History:  Procedure  Laterality Date  . BREAST BIOPSY Bilateral   . CARPAL TUNNEL RELEASE Bilateral   . COLONOSCOPY  multiple  . KNEE ARTHROPLASTY Right 05/12/2016   Procedure: COMPUTER ASSISTED TOTAL KNEE ARTHROPLASTY;  Surgeon: Dereck Leep, MD;  Location: [facility] ORS;  Service: Orthopedics;  Laterality: Right;  . KNEE SURGERY Right   . NOSE SURGERY    . TONSILLECTOMY      OB History   No obstetric history on file.      Home Medications    Prior to Admission medications   Medication Sig Start Date End Date Taking? Authorizing Provider  acetaminophen (TYLENOL) 650 MG CR tablet Take by mouth.    [provider]  baclofen (LIORESAL) 10 MG tablet Take 1 tablet (10 mg total) by mouth 3 (three) times daily. 12/03/18   Coral Spikes, DO  buPROPion (WELLBUTRIN XL) 300 MG 24 hr tablet TAKE 1 TABLET BY MOUTH ONCE DAILY. 08/02/18   Elby Beck, FNP  calcitRIOL (ROCALTROL) 0.25 MCG capsule Take 0.25 mcg by mouth daily.    [provider]  Cholecalciferol (VITAMIN D3) 1000 units CAPS Take 1,000 Units by mouth daily.    [provider]  cyanocobalamin (,VITAMIN B-12,) 1000 MCG/ML injection inject 1 milliliter intramuscularly every month 05/23/18   Elby Beck, FNP  lisinopril (PRINIVIL,ZESTRIL) 5 MG tablet TAKE 1 TABLET BY MOUTH ONCE DAILY. 08/02/18   Elby Beck, FNP  Multiple Vitamin (MULTIVITAMIN) tablet Take 1 tablet by mouth  at bedtime.     [provider]  NON FORMULARY HEMP OIL: DAILY    [provider]  propranolol (INDERAL) 40 MG tablet Take 1 tablet (40 mg total) by mouth 2 (two) times daily. 11/14/18   Elby Beck, FNP  sertraline (ZOLOFT) 50 MG tablet TAKE 1 TABLET BY MOUTH ONCE DAILY 10/11/18   Elby Beck, FNP  traMADol (ULTRAM) 50 MG tablet Take 1 tablet (50 mg total) by mouth every 12 (twelve) hours as needed for severe pain. 12/03/18   Coral Spikes, DO   Social History Social History   Tobacco Use  . Smoking status: Former  Smoker    Packs/day: 1.00    Years: 37.00    Pack years: 37.00    Types: Cigarettes    Quit date: 11/15/1996    Years since quitting: 22.0  . Smokeless tobacco: Never Used  Substance Use Topics  . Alcohol use: No    Alcohol/week: 0.0 standard drinks  . Drug use: No    Comment: uses CBD oil      Allergies   Latex, Other, Codeine, Demerol [meperidine], Fleet phospho-soda [sodium phosphates], and Prednisone   Review of Systems Review of Systems  Constitutional: Negative.   Musculoskeletal: Positive for back pain.   Physical Exam Triage Vital Signs ED Triage Vitals  Enc Vitals Group     BP 12/03/18 1429 (!) 141/66     Pulse Rate 12/03/18 1429 71     Resp 12/03/18 1429 18     Temp 12/03/18 1429 98 F (36.7 C)     Temp Source 12/03/18 1429 Oral     SpO2 12/03/18 1429 96 %     Weight 12/03/18 1430 229 lb 15 oz (104.3 kg)     Height 12/03/18 1430 5\' 4"  (1.626 m)     Head Circumference --      Peak Flow --      Pain Score 12/03/18 1429 10     Pain Loc --      Pain Edu? --      Excl. in [place]? --    Updated Vital Signs BP (!) 141/66 (BP Location: Left Arm)   Pulse 71   Temp 98 F (36.7 C) (Oral)   Resp 18   Ht 5\' 4"  (1.626 m)   Wt 104.3 kg   SpO2 96%   BMI 39.47 kg/m   Visual Acuity Right Eye Distance:   Left Eye Distance:   Bilateral Distance:    Right Eye Near:   Left Eye Near:    Bilateral Near:     Physical Exam Vitals signs and nursing note reviewed.  Constitutional:      General: She is not in acute distress.    Appearance: Normal appearance.  HENT:     Head: Normocephalic and atraumatic.  Eyes:     General:        Right eye: No discharge.        Left eye: No discharge.     Conjunctiva/sclera: Conjunctivae normal.  Cardiovascular:     Rate and Rhythm: Normal rate and regular rhythm.  Pulmonary:     Effort: Pulmonary effort is normal. No respiratory distress.     Breath sounds: Normal breath sounds.  Musculoskeletal:     Comments: Lumbar spine  with right-sided paraspinal musculature tenderness to palpation.  Neurological:     Mental Status: She is alert.  Psychiatric:        Mood and Affect: Mood normal.  Behavior: Behavior normal.    UC Treatments / Results  Labs (all labs ordered are listed, but only abnormal results are displayed) Labs Reviewed - No data to display  EKG   Radiology No results found.  Procedures Procedures (including critical care time)  Medications Ordered in UC Medications - No data to display  Initial Impression / Assessment and Plan / UC Course  I have reviewed the triage vital signs and the nursing notes.  Pertinent labs & imaging results that were available during my care of the patient were reviewed by me and considered in my medical decision making (see chart for details).    73 year old female presents with low back pain.  Treating with baclofen and tramadol.  Final Clinical Impressions(s) / UC Diagnoses   Final diagnoses:  Acute right-sided low back pain with right-sided sciatica     Discharge Instructions     Rest.  Heat.  Medications as prescribed.  Take care  Dr. Lacinda Axon    ED Prescriptions    Medication Sig Dispense Auth. Provider   baclofen (LIORESAL) 10 MG tablet Take 1 tablet (10 mg total) by mouth 3 (three) times daily. 30 each Thersa Salt G, DO   traMADol (ULTRAM) 50 MG tablet Take 1 tablet (50 mg total) by mouth every 12 (twelve) hours as needed for severe pain. 10 tablet Coral Spikes, DO     Controlled Substance Prescriptions [place] Controlled Substance Registry consulted? Yes, I have consulted the [place] Controlled Substances Registry for this patient, and feel the risk/benefit ratio today is favorable for proceeding with this prescription for a controlled substance.   Coral Spikes, DO 12/03/18 1520

## 2018-12-03 NOTE — Discharge Instructions (Signed)
Rest.  Heat.  Medications as prescribed.  Take care  Dr. Danaysia Rader  

## 2018-12-03 NOTE — ED Triage Notes (Addendum)
Pt was weed-eating and picking up rocks on Monday and Tuesday. Strained her back and has been having sciatic nerve pain. Called her PCP and she suggested anti-inflammatories but pt declined due to CKD. Now she wants the anti-inflammatories.

## 2018-12-04 ENCOUNTER — Telehealth: Payer: Self-pay

## 2018-12-04 NOTE — Telephone Encounter (Signed)
[place] Night - Client TELEPHONE ADVICE RECORD [facility] Patient Name: Kristie Byrd Gender: Female DOB: 1945-12-12 Age: 73 Y 1 M 23 D Return Phone Number: [phone] (Primary) Address: City/State/Zip: [place] [address] Client [facility] [place] Night - Client Client Site [place] Physician Tor Netters- NP Contact Type Call Who Is Calling Patient / Member / Family / Caregiver Call Type Triage / Clinical Relationship To Patient Self Return Phone Number [phone] (Primary) Chief Complaint Back Injury Reason for Call Symptomatic / Request for [place] states she called Thursday after hurting her back/upper buttocks area. The doctor wanted to give her an anti inflammatory but has stage 3 kidney disease. Is now in so much pain, she can hardly walk and the pain is moving down her right leg. States she is willing to take it for a little amount of time. Translation No Nurse Assessment Nurse: Susy Manor, RN, Megan Date/Time (Eastern Time): 12/03/2018 1:01:42 PM Confirm and document reason for call. If symptomatic, describe symptoms. ---Caller states she was lifting rocks and weed eating and then thursday she started hurting. Her back and upper buttocks area are hurting and it is going down her leg. She can barely walk. Denies fever. Has the patient had close contact with a person known or suspected to have the novel coronavirus illness OR traveled / lives in area with major community spread (including international travel) in the last 14 days from the onset of symptoms? * If Asymptomatic, screen for exposure and travel within the last 14 days. ---No Does the patient have any new or worsening symptoms? ---Yes Will a triage be completed? ---Yes Related visit to physician within the last 2 weeks? ---Yes Does the PT have any chronic conditions? (i.e. diabetes, asthma, this  includes High risk factors for pregnancy, etc.) ---Yes List chronic conditions. ---stage 3 chronic kidney disease Is this a behavioral health or substance abuse call? ---No PLEASE NOTE: All timestamps contained within this report are represented as [place] Standard Time. CONFIDENTIALTY NOTICE: This fax transmission is intended only for the addressee. It contains information that is legally privileged, confidential or otherwise protected from use or disclosure. If you are not the intended recipient, you are strictly prohibited from reviewing, disclosing, copying using or disseminating any of this information or taking any action in reliance on or regarding this information. If you have received this fax in error, please notify us immediately by telephone so that we can arrange for its return to [place]. Phone: [phone], Toll-Free: [phone], Fax: [phone] Page: 2 of 2 Call Id: [id number] Guidelines Guideline Title Affirmed Question Affirmed Notes Nurse Date/Time Eilene Ghazi Time) Back Injury [1] SEVERE pain (e.g., excruciating) AND [2] not improved 2 hours after pain medicine/ice packs Susy Manor, RN, Megan 12/03/2018 1:03:48 PM Disp. Time Eilene Ghazi Time) Disposition Final User 12/03/2018 1:10:17 PM See HCP within 4 Hours (or PCP triage) Yes Susy Manor, RN, Jinny Blossom Caller Disagree/Comply Comply Caller Understands Yes PreDisposition Call Doctor Care Advice Given Per Guideline SEE HCP WITHIN 4 HOURS (OR PCP TRIAGE): PAIN MEDICINES: CALL BACK IF: * You become worse. CARE ADVICE given per Back Injury (Adult) guideline. Comments User: Sula Soda, RN Date/Time Eilene Ghazi Time): 12/03/2018 1:04:17 PM bio freeze, icing, heat, ibuprophen. patient has tried all of these. Referrals [facility] [place]

## 2018-12-04 NOTE — Telephone Encounter (Signed)
Noted  

## 2018-12-04 NOTE — Telephone Encounter (Signed)
Patient was seen yesterday in urgent care.  Please refer to note as needed for details.

## 2018-12-07 ENCOUNTER — Other Ambulatory Visit: Payer: Self-pay | Admitting: Family Medicine

## 2018-12-07 NOTE — Telephone Encounter (Signed)
Patient called today and stated that she would like to know if she could be prescribed a pain medication  She was seen at the urgent care and they prescribed her  Daclofen 10 mg and Tramdol 50 mg. Patient stated that these medication are not working like she thinks they should. When she is sitting it feels okay but when she gets up and gets to moving it is very painful   C/B # [phone]

## 2018-12-08 NOTE — Telephone Encounter (Signed)
Spoke with pt relaying Kristie Byrd's message and instructions.  Pt verbalizes understanding.  Says has been already using some of those recommendations but is taking OTC arthritis pain relief med.  Says she is improving so she declines seeing ortho or PT.  But will call if things change.

## 2018-12-08 NOTE — Telephone Encounter (Signed)
Noted  

## 2018-12-08 NOTE — Telephone Encounter (Signed)
Please call patient and tell her that I don't think more medication is the best option. I would like to refer her to orthopedic specialist or physical therapy. She can take acetaminophen 2 tablets every 8 hours, use heat/ice, hot showers and I suggest she move as much as possible.

## 2018-12-11 ENCOUNTER — Encounter: Payer: Self-pay | Admitting: Family Medicine

## 2018-12-11 ENCOUNTER — Ambulatory Visit (INDEPENDENT_AMBULATORY_CARE_PROVIDER_SITE_OTHER): Payer: PPO | Admitting: Family Medicine

## 2018-12-11 ENCOUNTER — Other Ambulatory Visit: Payer: Self-pay

## 2018-12-11 VITALS — BP 130/80 | HR 71 | Temp 98.3°F | Ht 64.0 in | Wt 227.5 lb

## 2018-12-11 DIAGNOSIS — M549 Dorsalgia, unspecified: Secondary | ICD-10-CM | POA: Diagnosis not present

## 2018-12-11 MED ORDER — TRAMADOL HCL 50 MG PO TABS: 50.0000 mg | ORAL_TABLET | Freq: Four times a day (QID) | ORAL | 0 refills | Status: AC | PRN

## 2018-12-11 MED ORDER — TIZANIDINE HCL 2 MG PO CAPS: 2.0000 mg | ORAL_CAPSULE | Freq: Every evening | ORAL | 0 refills | Status: DC | PRN

## 2018-12-11 NOTE — Patient Instructions (Signed)
Ibuprofen over the counter, 200 mg.  Take 2 tablets twice a day.

## 2018-12-11 NOTE — Progress Notes (Signed)
Kristie Byrd T. Charlene Cowdrey, MD Primary Care and Sports Medicine Kristie Byrd at [facility] [place], [id number] Phone: [phone]  FAX: [place] - 73 y.o. female  MRN [id number]  Date of Birth: [birth year]  Visit Date: 12/11/2018  PCP: Elby Beck, FNP  Referred by: Elby Beck, FNP  Chief Complaint  Patient presents with  . Back Pain    x 2 weeks-Hurt doing yard work-seen at [facility] on 12/03/18   Subjective:   Kristie Byrd is a 73 y.o. very pleasant female patient with Body mass index is 39.05 kg/m. who presents with the following:  sEVERE PAIN in her back.  2 weeks ago was week eating and Thursday was 10/10 or "13/10" Has some CKD 3.  Went to urgent care and took some tramadol and baclofen. Took some tumeric.    Started in her right buttock.  If lays flat, she cannot get up and Going down the back of her legs.    She is highly anxious about any NSAIDs, since she has some CKD stage III, but her GFR is somewhere in the 40 to 50s range.  She is also highly anxious about taking any steroids, since she had a bad reaction to prednisone in the past.  Baclofen made her somnolent after taking it.  Past Medical History, Surgical History, Social History, Family History, Problem List, Medications, and Allergies have been reviewed and updated if relevant.  Patient Active Problem List   Diagnosis Date Noted  . Normocytic anemia 07/05/2018  . Anemia of chronic kidney failure, stage 3 (moderate) ([place]) 07/05/2018  . Renal insufficiency 07/14/2016  . Status post total right knee replacement 06/28/2016  . Anxiety 06/16/2016  . S/P total knee arthroplasty 05/12/2016  . Primary osteoarthritis of right knee 03/30/2016  . Well woman exam 05/27/2015  . Failed hearing screening 05/27/2015  . COPD (chronic obstructive pulmonary disease) ([place]) 11/05/2013  . Iron deficiency anemia 09/27/2012  . GERD (gastroesophageal reflux  disease) 08/16/2012  . Hypertension   . Personal history of colonic adenoma 04/07/2001    Past Medical History:  Diagnosis Date  . Arthritis   . Cancer ([place])    cervix, skin  . Colon polyps   . COPD (chronic obstructive pulmonary disease) ([place])   . Depression   . GERD (gastroesophageal reflux disease)   . Headache   . Hypertension   . Iron deficiency anemia 09/27/2012  . PONV (postoperative nausea and vomiting)   . Shingles     Past Surgical History:  Procedure Laterality Date  . BREAST BIOPSY Bilateral   . CARPAL TUNNEL RELEASE Bilateral   . COLONOSCOPY  multiple  . KNEE ARTHROPLASTY Right 05/12/2016   Procedure: COMPUTER ASSISTED TOTAL KNEE ARTHROPLASTY;  Surgeon: Dereck Leep, MD;  Location: [facility] ORS;  Service: Orthopedics;  Laterality: Right;  . KNEE SURGERY Right   . NOSE SURGERY    . TONSILLECTOMY      Social History   Socioeconomic History  . Marital status: Single    Spouse name: Not on file  . Number of children: 2  . Years of education: Not on file  . Highest education level: Not on file  Occupational History  . Occupation: retired  Scientific laboratory technician  . Financial resource strain: Not on file  . Food insecurity    Worry: Not on file    Inability: Not on file  . Transportation needs    Medical: Not on  file    Non-medical: Not on file  Tobacco Use  . Smoking status: Former Smoker    Packs/day: 1.00    Years: 37.00    Pack years: 37.00    Types: Cigarettes    Quit date: 11/15/1996    Years since quitting: 22.0  . Smokeless tobacco: Never Used  Substance and Sexual Activity  . Alcohol use: No    Alcohol/week: 0.0 standard drinks  . Drug use: No    Comment: uses CBD oil   . Sexual activity: Never  Lifestyle  . Physical activity    Days per week: Not on file    Minutes per session: Not on file  . Stress: Not on file  Relationships  . Social Herbalist on phone: Not on file    Gets together: Not on file    Attends religious service: Not  on file    Active member of club or organization: Not on file    Attends meetings of clubs or organizations: Not on file    Relationship status: Not on file  . Intimate partner violence    Fear of current or ex partner: Not on file    Emotionally abused: Not on file    Physically abused: Not on file    Forced sexual activity: Not on file  Other Topics Concern  . Not on file  Social History Narrative   Lives alone.  Divorced. Two daughters- in 77s, 3 grandchildren.   Does not know family history- raised in [facility].        Desires CPR.  Does not have HPOA.   She would possibly want life support if for short period of time . Unsure about feeding tube.    History reviewed. No pertinent family history.  Allergies  Allergen Reactions  . Latex Rash    Blisters  . Other Other (See Comments)    Band-Aid, blister  . Codeine Nausea And Vomiting  . Demerol [Meperidine] Nausea And Vomiting  . Fleet Phospho-Soda [Sodium Phosphates]     Vomiting   . Prednisone Other (See Comments)    Chest pressure    Medication list reviewed and updated in full in [place].  GEN: No fevers, chills. Nontoxic. Primarily MSK c/o today. MSK: Detailed in the HPI GI: tolerating PO intake without difficulty Neuro: No numbness, parasthesias, or tingling associated. Otherwise the pertinent positives of the ROS are noted above.   Objective:   BP 130/80   Pulse 71   Temp 98.3 F (36.8 C) (Temporal)   Ht 5\' 4"  (1.626 m)   Wt 227 lb 8 oz (103.2 kg)   SpO2 96%   BMI 39.05 kg/m    GEN: Well-developed,well-nourished,in no acute distress; alert,appropriate and cooperative throughout examination HEENT: Normocephalic and atraumatic without obvious abnormalities. Ears, externally no deformities PULM: Breathing comfortably in no respiratory distress EXT: No clubbing, cyanosis, or edema PSYCH: Normally interactive. Cooperative during the interview. Pleasant. Fairly labile affect.    Range of motion at  the waist: Moderate restriction of motions in all planes.  No echymosis or edema Rises to examination table with no difficulty Gait: non antalgic  Inspection/Deformity: N Paraspinus Tenderness: l3-s1 b  B Ankle Dorsiflexion (L5,4): 5/5 B Great Toe Dorsiflexion (L5,4): 5/5 Heel Walk (L5): WNL Toe Walk (S1): WNL Rise/Squat (L4): WNL  SENSORY B Medial Foot (L4): WNL B Dorsum (L5): WNL B Lateral (S1): WNL Light Touch: WNL Pinprick: WNL  REFLEXES Knee (L4): 2+  Ankle (S1): 2+  B SLR, seated: neg B SLR, supine: neg B FABER: neg B Reverse FABER: neg B Greater Troch: NT B Log Roll: neg B Stork: NT B Sciatic Notch: NT   Radiology: No results found.  Assessment and Plan:     ICD-10-CM   1. Acute back pain, unspecified back location, unspecified back pain laterality  M54.9    Somewhat limited by patient's intolerance to medications.  Abdomen and have her take some NSAIDs for a few days, tramadol for pain, and low-dose Zanaflex at night.  Follow-up: No follow-ups on file.  Meds ordered this encounter  Medications  . tizanidine (ZANAFLEX) 2 MG capsule    Sig: Take 1 capsule (2 mg total) by mouth at bedtime as needed for muscle spasms.    Dispense:  30 capsule    Refill:  0  . traMADol (ULTRAM) 50 MG tablet    Sig: Take 1 tablet (50 mg total) by mouth every 6 (six) hours as needed for moderate pain.    Dispense:  50 tablet    Refill:  0    Non-acute pain, refill   No orders of the defined types were placed in this encounter.   Signed,  Maud Deed. Luie Laneve, MD   Outpatient Encounter Medications as of 12/11/2018  Medication Sig  . acetaminophen (TYLENOL) 650 MG CR tablet Take by mouth.  Marland Kitchen buPROPion (WELLBUTRIN XL) 300 MG 24 hr tablet TAKE 1 TABLET BY MOUTH ONCE DAILY.  . calcitRIOL (ROCALTROL) 0.25 MCG capsule Take 0.25 mcg by mouth daily.  . Cholecalciferol (VITAMIN D3) 1000 units CAPS Take 1,000 Units by mouth daily.  . cyanocobalamin  (,VITAMIN B-12,) 1000 MCG/ML injection inject 1 milliliter intramuscularly every month  . lisinopril (PRINIVIL,ZESTRIL) 5 MG tablet TAKE 1 TABLET BY MOUTH ONCE DAILY.  . Multiple Vitamin (MULTIVITAMIN) tablet Take 1 tablet by mouth at bedtime.   . NON FORMULARY HEMP OIL: DAILY  . propranolol (INDERAL) 40 MG tablet Take 1 tablet (40 mg total) by mouth 2 (two) times daily.  . sertraline (ZOLOFT) 50 MG tablet TAKE 1 TABLET BY MOUTH ONCE DAILY  . Turmeric 450 MG CAPS Take 1 capsule by mouth daily.  . [DISCONTINUED] baclofen (LIORESAL) 10 MG tablet Take 1 tablet (10 mg total) by mouth 3 (three) times daily.  . [DISCONTINUED] traMADol (ULTRAM) 50 MG tablet Take 1 tablet (50 mg total) by mouth every 12 (twelve) hours as needed for severe pain.  . tizanidine (ZANAFLEX) 2 MG capsule Take 1 capsule (2 mg total) by mouth at bedtime as needed for muscle spasms.  . traMADol (ULTRAM) 50 MG tablet Take 1 tablet (50 mg total) by mouth every 6 (six) hours as needed for moderate pain.   No facility-administered encounter medications on file as of 12/11/2018.

## 2018-12-18 ENCOUNTER — Other Ambulatory Visit: Payer: Self-pay

## 2018-12-18 ENCOUNTER — Ambulatory Visit (INDEPENDENT_AMBULATORY_CARE_PROVIDER_SITE_OTHER): Payer: PPO | Admitting: Family Medicine

## 2018-12-18 ENCOUNTER — Ambulatory Visit (INDEPENDENT_AMBULATORY_CARE_PROVIDER_SITE_OTHER)
Admission: RE | Admit: 2018-12-18 | Discharge: 2018-12-18 | Disposition: A | Discharge status: Home / Self Care | Payer: PPO | Source: Ambulatory Visit | Attending: Family Medicine | Admitting: Family Medicine

## 2018-12-18 ENCOUNTER — Telehealth: Payer: Self-pay

## 2018-12-18 ENCOUNTER — Encounter: Payer: Self-pay | Admitting: Family Medicine

## 2018-12-18 VITALS — BP 140/70 | HR 74 | Temp 98.7°F | Ht 64.0 in | Wt 231.5 lb

## 2018-12-18 DIAGNOSIS — M549 Dorsalgia, unspecified: Secondary | ICD-10-CM | POA: Diagnosis not present

## 2018-12-18 DIAGNOSIS — M545 Low back pain: Secondary | ICD-10-CM | POA: Diagnosis not present

## 2018-12-18 DIAGNOSIS — S79911A Unspecified injury of right hip, initial encounter: Secondary | ICD-10-CM | POA: Diagnosis not present

## 2018-12-18 DIAGNOSIS — M25551 Pain in right hip: Secondary | ICD-10-CM | POA: Diagnosis not present

## 2018-12-18 MED ORDER — OXYCODONE-ACETAMINOPHEN 5-325 MG PO TABS: 1.0000 | ORAL_TABLET | ORAL | 0 refills | Status: DC | PRN

## 2018-12-18 NOTE — Telephone Encounter (Signed)
Pt already has in office appt scheduled today at 2 PM with Dr Lorelei Pont.

## 2018-12-18 NOTE — Progress Notes (Signed)
Yaron Grasse T. [name], MD Primary Care and Sports Medicine [facility] at [facility] [place], [address] Phone: [phone]  FAX: [place] - 73 y.o. female  MRN [id number]  Date of Birth: [birth year]  Visit Date: 12/18/2018  PCP: Elby Beck, FNP  Referred by: Elby Beck, FNP  Chief Complaint  Patient presents with  . Back Pain   Subjective:   Kristie Byrd is a 73 y.o. very pleasant female patient with Body mass index is 39.74 kg/m. who presents with the following:  The patient presents with severe recalcitrant back pain.  I saw her on 12/11/2018, and she descibed "13/10" back pain at times.  She had previously seen Dr. Lacinda Axon.  I gave her Zanaflex and tramadol given her medication intolerances.   Currently severe pain.  Clearly not doing well.  "feels like she is "going to snap."   No incontinence. No numbness.  She currently appears to be in significant pain.  She is not having any numbness, tingling, incontinence, or other red flags.  Wanted me to speak to her daughter on the phone.   Past Medical History, Surgical History, Social History, Family History, Problem List, Medications, and Allergies have been reviewed and updated if relevant.  Patient Active Problem List   Diagnosis Date Noted  . Normocytic anemia 07/05/2018  . Anemia of chronic kidney failure, stage 3 (moderate) ([place]) 07/05/2018  . Renal insufficiency 07/14/2016  . Status post total right knee replacement 06/28/2016  . Anxiety 06/16/2016  . S/P total knee arthroplasty 05/12/2016  . Primary osteoarthritis of right knee 03/30/2016  . Well woman exam 05/27/2015  . Failed hearing screening 05/27/2015  . COPD (chronic obstructive pulmonary disease) ([place]) 11/05/2013  . Iron deficiency anemia 09/27/2012  . GERD (gastroesophageal reflux disease) 08/16/2012  . Hypertension   . Personal history of colonic adenoma 04/07/2001    Past  Medical History:  Diagnosis Date  . Arthritis   . Cancer ([place])    cervix, skin  . Colon polyps   . COPD (chronic obstructive pulmonary disease) ([place])   . Depression   . GERD (gastroesophageal reflux disease)   . Headache   . Hypertension   . Iron deficiency anemia 09/27/2012  . PONV (postoperative nausea and vomiting)   . Shingles     Past Surgical History:  Procedure Laterality Date  . BREAST BIOPSY Bilateral   . CARPAL TUNNEL RELEASE Bilateral   . COLONOSCOPY  multiple  . KNEE ARTHROPLASTY Right 05/12/2016   Procedure: COMPUTER ASSISTED TOTAL KNEE ARTHROPLASTY;  Surgeon: Dereck Leep, MD;  Location: [facility] ORS;  Service: Orthopedics;  Laterality: Right;  . KNEE SURGERY Right   . NOSE SURGERY    . TONSILLECTOMY      Social History   Socioeconomic History  . Marital status: Single    Spouse name: Not on file  . Number of children: 2  . Years of education: Not on file  . Highest education level: Not on file  Occupational History  . Occupation: retired  Scientific laboratory technician  . Financial resource strain: Not on file  . Food insecurity    Worry: Not on file    Inability: Not on file  . Transportation needs    Medical: Not on file    Non-medical: Not on file  Tobacco Use  . Smoking status: Former Smoker    Packs/day: 1.00    Years: 37.00    Pack years:  37.00    Types: Cigarettes    Quit date: 11/15/1996    Years since quitting: 22.1  . Smokeless tobacco: Never Used  Substance and Sexual Activity  . Alcohol use: No    Alcohol/week: 0.0 standard drinks  . Drug use: No    Comment: uses CBD oil   . Sexual activity: Never  Lifestyle  . Physical activity    Days per week: Not on file    Minutes per session: Not on file  . Stress: Not on file  Relationships  . Social Herbalist on phone: Not on file    Gets together: Not on file    Attends religious service: Not on file    Active member of club or organization: Not on file    Attends meetings of clubs or  organizations: Not on file    Relationship status: Not on file  . Intimate partner violence    Fear of current or ex partner: Not on file    Emotionally abused: Not on file    Physically abused: Not on file    Forced sexual activity: Not on file  Other Topics Concern  . Not on file  Social History Narrative   Lives alone.  Divorced. Two daughters- in 61s, 3 grandchildren.   Does not know family history- raised in [facility].        Desires CPR.  Does not have HPOA.   She would possibly want life support if for short period of time . Unsure about feeding tube.    No family history on file.  Allergies  Allergen Reactions  . Latex Rash    Blisters  . Other Other (See Comments)    Band-Aid, blister  . Codeine Nausea And Vomiting  . Demerol [Meperidine] Nausea And Vomiting  . Fleet Phospho-Soda [Sodium Phosphates]     Vomiting   . Prednisone Other (See Comments)    Chest pressure    Medication list reviewed and updated in full in [place].  GEN: No fevers, chills. Nontoxic. Primarily MSK c/o today. MSK: Detailed in the HPI GI: tolerating PO intake without difficulty Neuro: No numbness, parasthesias, or tingling associated. Appears to be in pain Otherwise the pertinent positives of the ROS are noted above.   Objective:   BP 140/70   Pulse 74   Temp 98.7 F (37.1 C) (Temporal)   Ht 5\' 4"  (1.626 m)   Wt 231 lb 8 oz (105 kg)   SpO2 94%   BMI 39.74 kg/m    GEN: WDWN, NAD, Non-toxic, Alert & Oriented x 3 HEENT: Atraumatic, Normocephalic.  Ears and Nose: No external deformity. EXTR: No clubbing/cyanosis/edema NEURO: antalgic gait PSYCH: Normally interactive. Conversant. Not depressed or anxious appearing.  Calm demeanor.    EXAM LIMITED BY PAIN   GEN: Well-developed,well-nourished,in no acute distress; alert,appropriate and cooperative throughout examination HEENT: Normocephalic and atraumatic without obvious abnormalities. Ears, externally  no deformities PULM: Breathing comfortably in no respiratory distress EXT: No clubbing, cyanosis, or edema PSYCH: Quite labile   Range of motion at  the waist: Flexion, extension, lateral bending and rotation:  limited  No echymosis or edema Unable to get to table, quite antalgic  Inspection/Deformity: N Paraspinus Tenderness: diffuse R > L  SENSORY B Medial Foot (L4): WNL B Dorsum (L5): WNL B Lateral (S1): WNL Light Touch: WNL Pinprick: WNL  B SLR, seated: neg B SLR, supine: neg B Log Roll: neg  Radiology: Dg Lumbar Spine  Complete  Result Date: 12/18/2018 CLINICAL DATA:  Low back pain since a lifting injury 3 weeks ago. EXAM: LUMBAR SPINE - COMPLETE 4+ VIEW COMPARISON:  None. FINDINGS: No acute abnormality is identified. 1.8 cm anterolisthesis L5 on S1 appears to be due to facet degenerative disease. Multilevel facet arthropathy is worst at L4-5 and L5-S1. Loss of disc space height is also worst at these levels. Paraspinous structures demonstrate atherosclerosis. Multiple calcifications in the left upper quadrant are consistent with granulomas in the spleen. IMPRESSION: No acute abnormality. Lumbar spondylosis appears worst at L4-5 and L5-S1. Electronically Signed   By: Inge Rise M.D.   On: 12/18/2018 15:01   Dg Hip Unilat W Or W/o Pelvis Min 4 Views Right  Result Date: 12/18/2018 CLINICAL DATA:  Right hip pain since a lifting injury 3 weeks ago. Initial encounter. EXAM: DG HIP (WITH OR WITHOUT PELVIS) 4+V RIGHT COMPARISON:  None. FINDINGS: There is no evidence of hip fracture or dislocation. There is no evidence of arthropathy or other focal bone abnormality. IMPRESSION: Negative exam. Electronically Signed   By: Inge Rise M.D.   On: 12/18/2018 14:59     Assessment and Plan:     ICD-10-CM   1. Acute back pain, unspecified back location, unspecified back pain laterality  M54.9 DG Lumbar Spine Complete    DG Hip Unilat W OR W/O Pelvis Min 4 Views Right     Ambulatory referral to Orthopedic Surgery  2. Severe back pain  M54.9 DG Lumbar Spine Complete    DG Hip Unilat W OR W/O Pelvis Min 4 Views Right    Ambulatory referral to Orthopedic Surgery   >40 minutes spent in face to face time with patient, >50% spent in counselling or coordination of care: Additional significant amount of time was spent in discussion about anatomy, potential options, allergies, and this was all done with the patient.  She subsequently asked me to get her daughter out of the parking lot.  and review all of this with her again, and I answered all of their questions to the best of my ability.  The patient is in recalcitrant 10/10 back pain markedly altered from her baseline state. She is unable per her report to take steroids.   She has significant likely old compression at L5 with notable anterolisthesis at L5-s1.  This certainly could be causing her severe pain.  I am going to see if Dr. Lynann Bologna can help the patient and offer her a higher level of care.   Follow-up: No follow-ups on file.  Meds ordered this encounter  Medications  . oxyCODONE-acetaminophen (PERCOCET/ROXICET) 5-325 MG tablet    Sig: Take 1 tablet by mouth every 4 (four) hours as needed for severe pain.    Dispense:  20 tablet    Refill:  0   Orders Placed This Encounter  Procedures  . DG Lumbar Spine Complete  . DG Hip Unilat W OR W/O Pelvis Min 4 Views Right  . Ambulatory referral to Orthopedic Surgery    Signed,  Frederico Hamman T. Dagmawi Venable, MD   Outpatient Encounter Medications as of 12/18/2018  Medication Sig  . acetaminophen (TYLENOL) 650 MG CR tablet Take by mouth.  Marland Kitchen buPROPion (WELLBUTRIN XL) 300 MG 24 hr tablet TAKE 1 TABLET BY MOUTH ONCE DAILY.  . calcitRIOL (ROCALTROL) 0.25 MCG capsule Take 0.25 mcg by mouth daily.  . Cholecalciferol (VITAMIN D3) 1000 units CAPS Take 1,000 Units by mouth daily.  . cyanocobalamin (,VITAMIN B-12,) 1000 MCG/ML injection inject 1 milliliter  intramuscularly  every month  . lisinopril (PRINIVIL,ZESTRIL) 5 MG tablet TAKE 1 TABLET BY MOUTH ONCE DAILY.  . Multiple Vitamin (MULTIVITAMIN) tablet Take 1 tablet by mouth at bedtime.   . NON FORMULARY HEMP OIL: DAILY  . propranolol (INDERAL) 40 MG tablet Take 1 tablet (40 mg total) by mouth 2 (two) times daily.  . sertraline (ZOLOFT) 50 MG tablet TAKE 1 TABLET BY MOUTH ONCE DAILY  . tizanidine (ZANAFLEX) 2 MG capsule Take 1 capsule (2 mg total) by mouth at bedtime as needed for muscle spasms.  . traMADol (ULTRAM) 50 MG tablet Take 1 tablet (50 mg total) by mouth every 6 (six) hours as needed for moderate pain.  . Turmeric 450 MG CAPS Take 1 capsule by mouth daily.  Marland Kitchen oxyCODONE-acetaminophen (PERCOCET/ROXICET) 5-325 MG tablet Take 1 tablet by mouth every 4 (four) hours as needed for severe pain.   No facility-administered encounter medications on file as of 12/18/2018.

## 2018-12-18 NOTE — Telephone Encounter (Signed)
[facility] Night - Client Nonclinical Telephone Record Access[facility]urse Client [facility] Night - Client Client Site [place] Physician Owens Loffler - MD Contact Type Call Who Is Calling Patient / Member / Family / Caregiver Caller Name Kent Phone Number [phone] Patient Name Kristie Byrd Patient DOB 29-Mar-1946 Call Type Message Only Information Provided Reason for Call Request to Schedule Office Appointment Initial Comment Caller states she wants to make an appointment for as soon as possible. She is still having severe back pain. Additional Comment Please call as soon as possible. Call Closed By: [place] Transaction Date/Time: 12/18/2018 7:44:23 AM (ET)

## 2018-12-19 DIAGNOSIS — M545 Low back pain: Secondary | ICD-10-CM | POA: Diagnosis not present

## 2018-12-29 ENCOUNTER — Other Ambulatory Visit: Payer: Self-pay | Admitting: Orthopedic Surgery

## 2018-12-29 DIAGNOSIS — M5416 Radiculopathy, lumbar region: Secondary | ICD-10-CM

## 2019-01-06 ENCOUNTER — Other Ambulatory Visit: Payer: Self-pay | Admitting: Family Medicine

## 2019-01-06 DIAGNOSIS — R251 Tremor, unspecified: Secondary | ICD-10-CM

## 2019-01-09 ENCOUNTER — Other Ambulatory Visit: Payer: Self-pay

## 2019-01-09 ENCOUNTER — Ambulatory Visit
Admission: RE | Admit: 2019-01-09 | Discharge: 2019-01-09 | Disposition: A | Discharge status: Home / Self Care | Payer: PPO | Source: Ambulatory Visit | Attending: Orthopedic Surgery | Admitting: Orthopedic Surgery

## 2019-01-09 DIAGNOSIS — M545 Low back pain: Secondary | ICD-10-CM | POA: Diagnosis not present

## 2019-01-09 DIAGNOSIS — M5416 Radiculopathy, lumbar region: Secondary | ICD-10-CM | POA: Insufficient documentation

## 2019-01-12 DIAGNOSIS — M545 Low back pain: Secondary | ICD-10-CM | POA: Diagnosis not present

## 2019-01-16 ENCOUNTER — Other Ambulatory Visit: Payer: Self-pay | Admitting: Physical Medicine and Rehabilitation

## 2019-01-16 DIAGNOSIS — M5416 Radiculopathy, lumbar region: Secondary | ICD-10-CM

## 2019-01-20 ENCOUNTER — Other Ambulatory Visit: Payer: Self-pay | Admitting: Family Medicine

## 2019-01-20 NOTE — Telephone Encounter (Signed)
Last filled on 12/11/2018 for #30 with 0 refill LOV 12/18/2018 acute with Dr Lorelei Pont Future appointment on 02/16/2019

## 2019-01-24 ENCOUNTER — Encounter
Admission: RE | Admit: 2019-01-24 | Discharge: 2019-01-24 | Disposition: A | Discharge status: Home / Self Care | Payer: PPO | Source: Ambulatory Visit | Attending: Physical Medicine and Rehabilitation | Admitting: Physical Medicine and Rehabilitation

## 2019-01-24 ENCOUNTER — Other Ambulatory Visit: Payer: Self-pay

## 2019-01-24 DIAGNOSIS — M5416 Radiculopathy, lumbar region: Secondary | ICD-10-CM

## 2019-01-24 MED ORDER — TECHNETIUM TC 99M MEDRONATE IV KIT
20.0000 | PACK | Freq: Once | INTRAVENOUS | Status: AC | PRN
  Administered 2019-01-24: 23.446 via INTRAVENOUS

## 2019-01-29 DIAGNOSIS — M8448XD Pathological fracture, other site, subsequent encounter for fracture with routine healing: Secondary | ICD-10-CM | POA: Diagnosis not present

## 2019-01-30 ENCOUNTER — Other Ambulatory Visit: Payer: Self-pay | Admitting: Physical Medicine and Rehabilitation

## 2019-01-30 DIAGNOSIS — M8448XD Pathological fracture, other site, subsequent encounter for fracture with routine healing: Secondary | ICD-10-CM

## 2019-01-31 DIAGNOSIS — M8448XD Pathological fracture, other site, subsequent encounter for fracture with routine healing: Secondary | ICD-10-CM | POA: Diagnosis not present

## 2019-02-02 ENCOUNTER — Ambulatory Visit
Admission: RE | Admit: 2019-02-02 | Discharge: 2019-02-02 | Disposition: A | Discharge status: Home / Self Care | Payer: PPO | Source: Ambulatory Visit | Attending: Physical Medicine and Rehabilitation | Admitting: Physical Medicine and Rehabilitation

## 2019-02-02 ENCOUNTER — Other Ambulatory Visit: Payer: Self-pay

## 2019-02-02 DIAGNOSIS — S3210XA Unspecified fracture of sacrum, initial encounter for closed fracture: Secondary | ICD-10-CM | POA: Diagnosis not present

## 2019-02-02 DIAGNOSIS — M8448XD Pathological fracture, other site, subsequent encounter for fracture with routine healing: Secondary | ICD-10-CM

## 2019-02-02 NOTE — Consult Note (Signed)
Chief Complaint: Patient was seen in consultation today for evaluation of bilateral sacral fractures at the request of Wang,Hao  Referring Physician(s): Wang,Hao   History of Present Illness: Kristie Byrd is a 73 y.o. female who began having posterior pelvic pain after working in her garden and lifting stones 3 months ago.  The pain began on the right and then moved to the left.  She went from ambulating without assistance to using a neighbor's walker.  Her pain became excruciating.  Over the last month the pain has improved.  She now rates her pain as 2/10 and has not used prescription pain medications in over 3 weeks.  She is still using the walker, but occasionally uses only a cane.  She scored 20/24 on the Roland-Morris disability score.    Past Medical History:  Diagnosis Date   Arthritis    Cancer ([place])    cervix, skin   Colon polyps    COPD (chronic obstructive pulmonary disease) (HCC)    Depression    GERD (gastroesophageal reflux disease)    Headache    Hypertension    Iron deficiency anemia 09/27/2012   PONV (postoperative nausea and vomiting)    Shingles     Past Surgical History:  Procedure Laterality Date   BREAST BIOPSY Bilateral    CARPAL TUNNEL RELEASE Bilateral    COLONOSCOPY  multiple   KNEE ARTHROPLASTY Right 05/12/2016   Procedure: COMPUTER ASSISTED TOTAL KNEE ARTHROPLASTY;  Surgeon: Dereck Leep, MD;  Location: [facility] ORS;  Service: Orthopedics;  Laterality: Right;   KNEE SURGERY Right    NOSE SURGERY     TONSILLECTOMY      Allergies: Codeine, Demerol [meperidine], Fleet phospho-soda [sodium phosphates], Latex, Other, and Prednisone  Medications: Prior to Admission medications   Medication Sig Start Date End Date Taking? Authorizing Provider  acetaminophen (TYLENOL) 650 MG CR tablet Take by mouth.   Yes [provider]  buPROPion (WELLBUTRIN XL) 300 MG 24 hr tablet TAKE 1 TABLET BY MOUTH ONCE DAILY. 08/02/18  Yes  Elby Beck, FNP  calcitRIOL (ROCALTROL) 0.25 MCG capsule Take 0.25 mcg by mouth daily.   Yes [provider]  Cholecalciferol (VITAMIN D3) 1000 units CAPS Take 1,000 Units by mouth daily.   Yes [provider]  cyanocobalamin (,VITAMIN B-12,) 1000 MCG/ML injection inject 1 milliliter intramuscularly every month 05/23/18  Yes Elby Beck, FNP  lisinopril (PRINIVIL,ZESTRIL) 5 MG tablet TAKE 1 TABLET BY MOUTH ONCE DAILY. 08/02/18  Yes Elby Beck, FNP  Multiple Vitamin (MULTIVITAMIN) tablet Take 1 tablet by mouth at bedtime.    Yes [provider]  NON FORMULARY HEMP OIL: DAILY   Yes [provider]  oxyCODONE-acetaminophen (PERCOCET/ROXICET) 5-325 MG tablet Take 1 tablet by mouth every 4 (four) hours as needed for severe pain. 12/18/18  Yes Copland, Frederico Hamman, MD  propranolol (INDERAL) 40 MG tablet TAKE 1 TABLET BY MOUTH TWICE DAILY 01/10/19  Yes Elby Beck, FNP  sertraline (ZOLOFT) 50 MG tablet TAKE 1 TABLET BY MOUTH ONCE DAILY 10/11/18  Yes Elby Beck, FNP  Turmeric 450 MG CAPS Take 1 capsule by mouth daily.   Yes [provider]     No family history on file.  Social History   Socioeconomic History   Marital status: Single    Spouse name: Not on file   Number of children: 2   Years of education: Not on file   Highest education level: Not on file  Occupational History  Occupation: retired  Scientist, product/process development strain: Not on [employer] insecurity    Worry: Not on file    Inability: Not on Lexicographer needs    Medical: Not on file    Non-medical: Not on file  Tobacco Use   Smoking status: Former Smoker    Packs/day: 1.00    Years: 37.00    Pack years: 37.00    Types: Cigarettes    Quit date: 11/15/1996    Years since quitting: 22.2   Smokeless tobacco: Never Used  Substance and Sexual Activity   Alcohol use: No    Alcohol/week: 0.0 standard drinks   Drug use: No     Comment: uses CBD oil    Sexual activity: Never  Lifestyle   Physical activity    Days per week: Not on file    Minutes per session: Not on file   Stress: Not on file  Relationships   Social connections    Talks on phone: Not on file    Gets together: Not on file    Attends religious service: Not on file    Active member of club or organization: Not on file    Attends meetings of clubs or organizations: Not on file    Relationship status: Not on file  Other Topics Concern   Not on file  Social History Narrative   Lives alone.  Divorced. Two daughters- in 48s, 3 grandchildren.   Does not know family history- raised in [facility].        Desires CPR.  Does not have HPOA.   She would possibly want life support if for short period of time . Unsure about feeding tube.    Review of Systems: A 12 point ROS discussed and pertinent positives are indicated in the HPI above.  All other systems are negative.  Review of Systems  Vital Signs: BP 122/65    Pulse 82    Temp 97.6 F (36.4 C)    Resp 16    Ht 5\' 4"  (1.626 m)    Wt 103 kg    SpO2 98%    BMI 38.96 kg/m   Physical Exam  Imaging: Mr Lumbar Spine Wo Contrast  Result Date: 01/10/2019 CLINICAL DATA:  73 year old female with 2 months of low back pain, bilateral buttock groin and posterior thigh pain. Hip radiographs on 12/18/2018 report an injury 3 weeks prior to that. EXAM: MRI LUMBAR SPINE WITHOUT CONTRAST TECHNIQUE: Multiplanar, multisequence MR imaging of the lumbar spine was performed. No intravenous contrast was administered. COMPARISON:  Lumbar radiographs [employer] 12/18/2018. FINDINGS: Segmentation:  Normal on the comparison radiographs. Alignment: Grade 1/2 anterolisthesis of L5 on S1. Mild retrolisthesis of L4 on L5. Straightening of lumbar lordosis otherwise. Vertebrae: Mildly heterogeneous bone marrow signal diffusely, although most pronounced L4 through the sacrum. There is confluent abnormal  decreased T1 and heterogeneous STIR signal in the bilateral sacral ala and central S2 body (series 9 images 34 and 36. Associated mild presacral and parasacral soft tissue edema (series 7, image 9). The mild degenerative appearing marrow edema at the L4-L5 endplates. No other suspicious marrow lesion. The visible iliac bones appear to remain normal. Conus medullaris and cauda equina: Conus extends to the T12-L1 level. No lower spinal cord or conus signal abnormality. Paraspinal and other soft tissues: Negative visible abdominal viscera and paraspinal soft tissues. In the right hemipelvis there is a 15 millimeter simple appearing cyst on series  7, image 2 which to probably associated with the right adnexa. Disc levels: T11-T12: Partially visible disc space loss and disc bulge. No definite stenosis. T12-L1:  Mild facet hypertrophy.  No stenosis. L1-L2: Subtle retrolisthesis. Mild disc bulge and facet hypertrophy. No stenosis. L2-L3: Mild far lateral disc bulging. Mild posterior element hypertrophy. No stenosis. L3-L4: Circumferential disc bulge, mostly far lateral. Mild to moderate posterior element hypertrophy. The neural foramina are mostly affected. There is moderate left and mild right L3 foraminal stenosis. No significant spinal or lateral recess stenosis. L4-L5: Mild retrolisthesis with disc space loss and vacuum disc. Circumferential disc bulge and endplate spurring. Moderate facet hypertrophy with capacious facet joints containing fluid. No spinal or convincing lateral recess stenosis, but there is moderate left and moderate to severe right L4 foraminal stenosis. L5-S1: Grade 2 anterolisthesis. Severe disc space loss. Circumferential disc osteophyte complex and severe posterior element hypertrophy. Severe spinal stenosis. Mild to moderate lateral recess stenosis. Severe bilateral L5 foraminal stenosis. IMPRESSION: 1. Abnormal sacrum, with confluent abnormal marrow signal in the bilateral ala and the S2 body.  Mild surrounding soft tissue inflammation. 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 Is the patient up-to-date on breast cancer screening? There is heterogeneous marrow signal elsewhere but no other suspicious bone lesion in the lumbar spine. 2. Grade 2 spondylolisthesis at L5-S1 with severe disc and posterior element degeneration. Subsequent severe spinal and bilateral L5 foraminal stenosis. 3. L4-L5 degeneration with moderate to severe L4 foraminal stenosis. 4. A 15 mm simple appearing right adnexal region cyst meets consensus criteria for benign etiology, no follow-up indicated. This recommendation follows ACR consensus guidelines: White Paper of the ACR Incidental Findings Committee II on Adnexal Findings. J Am Coll Radiol [phone]. Electronically Signed   By: Genevie Ann M.D.   On: 01/10/2019 10:22   Nm Bone Scan Whole Body  Result Date: 01/24/2019 CLINICAL DATA:  Lumbar radiculopathy.  Back pain. EXAM: NUCLEAR MEDICINE WHOLE BODY BONE SCAN TECHNIQUE: Whole body anterior and posterior images were obtained approximately 3 hours after intravenous injection of radiopharmaceutical. RADIOPHARMACEUTICALS:  23.4 mCi Technetium-66m MDP IV COMPARISON:  Lumbar MRI from 01/09/2019 FINDINGS: Accentuated activity transversely in the sacrum and in both sacral ala in a "TransMontaigne" typical pattern for insufficiency fracture. Focal activity the base of the right great toe and along the left ankle. Activity along a right knee prosthesis and along the left knee, likely degenerative. Degenerative activity at the [facility] joints and in the spine. There is some focal activity eccentric to the left in the midcervical spine which is most likely degenerative but technically nonspecific. IMPRESSION: 1. Pelvic activity has the characteristic "Gustavus Bryant Sign" appearance of benign insufficiency fractures in the sacrum. The remaining  activity scattered around joints in the feet, knees, shoulder, and spine is most compatible with degenerative disease, although activity eccentric to the left in the mid cervical spine is not entirely specific and could be from facet degenerative disease or a C-spine lesion. Consider dedicated cervical spine radiographs to assess for explanatory spurring. Electronically Signed   By: Van Clines M.D.   On: 01/24/2019 17:32    Labs:  CBC: Recent Labs    10/04/18 1343  WBC 5.5  HGB 12.0  HCT 37.0  PLT 290    COAGS: No results for input(s): INR, APTT in the last 8760 hours.  BMP: Recent Labs    10/04/18 1343  NA 137  K 4.4  CL 102  CO2 25  GLUCOSE 115*  BUN 20  CALCIUM 9.2  CREATININE 1.15*  GFRNONAA 48*  GFRAA 55*    LIVER FUNCTION TESTS: No results for input(s): BILITOT, AST, ALT, ALKPHOS, PROT, ALBUMIN in the last 8760 hours.  TUMOR MARKERS: No results for input(s): AFPTM, CEA, CA199, CHROMGRNA in the last 8760 hours.  Assessment and Plan:  Healing bilateral sacral fractures.  Given the patient's low level of pain and progressive mobility, the benefits of sacroplasty at this point would be fairly limited.  I discussed this with the patient and answered all questions.  If she fails to progress or regresses, I would be happy to see her again.  Thank you for this interesting consult.  I greatly enjoyed meeting Leona A Nordby and look forward to participating in their care.  A copy of this report was sent to the requesting provider on this date.  Electronically Signed: Arna Snipe, MD 02/02/2019, 4:35 PM   I spent a total of  15 Minutes   in face to face in clinical consultation, greater than 50% of which was counseling/coordinating care for Mrs Soberanis

## 2019-02-14 DIAGNOSIS — M545 Low back pain: Secondary | ICD-10-CM | POA: Diagnosis not present

## 2019-02-16 ENCOUNTER — Ambulatory Visit: Payer: PPO | Admitting: Family Medicine

## 2019-02-23 ENCOUNTER — Other Ambulatory Visit: Payer: Self-pay | Admitting: Family Medicine

## 2019-02-23 DIAGNOSIS — Z1231 Encounter for screening mammogram for malignant neoplasm of breast: Secondary | ICD-10-CM

## 2019-02-27 DIAGNOSIS — M545 Low back pain: Secondary | ICD-10-CM | POA: Diagnosis not present

## 2019-03-06 DIAGNOSIS — M545 Low back pain: Secondary | ICD-10-CM | POA: Diagnosis not present

## 2019-03-08 DIAGNOSIS — M545 Low back pain: Secondary | ICD-10-CM | POA: Diagnosis not present

## 2019-03-09 ENCOUNTER — Encounter: Payer: Self-pay | Admitting: Family Medicine

## 2019-03-09 ENCOUNTER — Telehealth: Payer: Self-pay | Admitting: Family Medicine

## 2019-03-09 ENCOUNTER — Ambulatory Visit (INDEPENDENT_AMBULATORY_CARE_PROVIDER_SITE_OTHER): Payer: PPO | Admitting: Family Medicine

## 2019-03-09 VITALS — Ht 64.0 in | Wt 229.0 lb

## 2019-03-09 DIAGNOSIS — N183 Chronic kidney disease, stage 3 unspecified: Secondary | ICD-10-CM | POA: Diagnosis not present

## 2019-03-09 DIAGNOSIS — D631 Anemia in chronic kidney disease: Secondary | ICD-10-CM

## 2019-03-09 DIAGNOSIS — S3210XD Unspecified fracture of sacrum, subsequent encounter for fracture with routine healing: Secondary | ICD-10-CM | POA: Diagnosis not present

## 2019-03-09 DIAGNOSIS — R0982 Postnasal drip: Secondary | ICD-10-CM | POA: Diagnosis not present

## 2019-03-09 DIAGNOSIS — R251 Tremor, unspecified: Secondary | ICD-10-CM

## 2019-03-09 NOTE — Telephone Encounter (Signed)
Patient wants to know before the end of the day.  Patient can be reached at  [phone].

## 2019-03-09 NOTE — Telephone Encounter (Signed)
Spoke with patient - her computer is apparently blocking her from seeing messages on mychart. It shows on our end that the message was read by the patient but she states that on her end it is blank.   Pt is aware of recommendations. Nothing further needed.

## 2019-03-09 NOTE — Telephone Encounter (Signed)
Please call her and see if she got my Mychart message. I wanted her to use saline nasal spray as needed for congestion and if she is outside for prolonged period of time and feels allergies acting up.

## 2019-03-09 NOTE — Telephone Encounter (Signed)
Pt called wanting to know what debbie wanted her to get OTC nose spray

## 2019-03-09 NOTE — Telephone Encounter (Signed)
Please advise Jackelyn Poling, thanks. I do not see anything mentioned in the [facility] notes

## 2019-03-09 NOTE — Progress Notes (Signed)
Virtual Visit via Video Note  I connected with Kristie Byrd on 03/09/19 at 11:00 AM EDT by a video enabled telemedicine application and verified that I am speaking with the correct person using two identifiers.  Location: Patient: In her home Provider: [place]   I discussed the limitations of evaluation and management by telemedicine and the availability of in person appointments. The patient expressed understanding and agreed to proceed.  History of Present Illness: Chief Complaint  Patient presents with  . Follow-up    No concerns  This is a 73 year old female who presents for virtual visit today for follow-up of chronic medical conditions.  Back pain- has had for 5 months, extensive work up, has a fracture of sacrum.  She has recently been cleared to drive and is happy to be getting out more.  She is using either a cane or walker around her home and has not had any falls.  She is going to physical therapy.  She hopes to eventually get back to work and will have orthopedist follow-up to determine when she is ready.  Tremor-improved.  Not affecting daily activities.  Postnasal drainage-she wakes up with phlegm in her throat. PND.  Clear rhinorrhea.  She has had some intermittent sneezing and mild headache.  She does not have any shortness of breath or wheeze.  Has not been taking anything for her symptoms.    Observations/Objective: Patient is alert and answers questions appropriately.  Visible skin is unremarkable.  She is normally conversive without shortness of breath, audible wheeze or witnessed cough. Ht 5\' 4"  (1.626 m)   Wt 229 lb (103.9 kg)   BMI 39.31 kg/m  Wt Readings from Last 3 Encounters:  03/09/19 229 lb (103.9 kg)  02/02/19 227 lb (103 kg)  12/18/18 231 lb 8 oz (105 kg)     Assessment and Plan: 1. Closed fracture of sacrum with routine healing, unspecified portion of sacrum, subsequent encounter -She is gradually improving with improved mobility and  decreased pain.  Continue physical therapy.  Continue follow-up with orthopedics.  2. Post-nasal drainage -Provided her information regarding oral antihistamine and saline nasal spray.  She is allergic to prednisone therefore did not use nasal corticosteroid.  3. Tremor -Improved  4. Anemia of chronic kidney failure, stage 3 (moderate) -She has follow-up with hematology next month which will include labs.  -Follow-up in 6 months   Clarene Reamer, FNP-BC  [facility]  03/09/2019 1:32 PM   Follow Up Instructions: Visit recap sent to patient via MyChart   I discussed the assessment and treatment plan with the patient. The patient was provided an opportunity to ask questions and all were answered. The patient agreed with the plan and demonstrated an understanding of the instructions.   The patient was advised to call back or seek an in-person evaluation if the symptoms worsen or if the condition fails to improve as anticipated.   Elby Beck, FNP

## 2019-03-19 DIAGNOSIS — M545 Low back pain: Secondary | ICD-10-CM | POA: Diagnosis not present

## 2019-03-20 ENCOUNTER — Encounter: Payer: Self-pay | Admitting: Family Medicine

## 2019-03-20 ENCOUNTER — Telehealth: Payer: Self-pay

## 2019-03-20 NOTE — Telephone Encounter (Signed)
Kristie Byrd, patient is scheduled to have B12 injection tomorrow-03/21/2019, upon reviewing her records I noticed her last B12 level was checked on 08/26/2017 and last B12 injection was on 08/29/2018. Please advise if need to get labs ahead or any other suggestions? Thank you

## 2019-03-21 ENCOUNTER — Encounter: Payer: Self-pay | Admitting: Family Medicine

## 2019-03-21 ENCOUNTER — Ambulatory Visit (INDEPENDENT_AMBULATORY_CARE_PROVIDER_SITE_OTHER): Payer: PPO

## 2019-03-21 ENCOUNTER — Other Ambulatory Visit (INDEPENDENT_AMBULATORY_CARE_PROVIDER_SITE_OTHER): Payer: PPO

## 2019-03-21 ENCOUNTER — Other Ambulatory Visit: Payer: Self-pay | Admitting: Family Medicine

## 2019-03-21 DIAGNOSIS — Z23 Encounter for immunization: Secondary | ICD-10-CM | POA: Diagnosis not present

## 2019-03-21 DIAGNOSIS — E538 Deficiency of other specified B group vitamins: Secondary | ICD-10-CM

## 2019-03-21 MED ORDER — CYANOCOBALAMIN 1000 MCG/ML IJ SOLN
1000.0000 ug | Freq: Once | INTRAMUSCULAR | Status: AC
  Administered 2019-03-21: 1000 ug via INTRAMUSCULAR

## 2019-03-21 NOTE — Telephone Encounter (Signed)
Noted will discuss with patient during her nurse visit today.

## 2019-03-21 NOTE — Progress Notes (Signed)
Per orders of Clarene Reamer, NP injection of monthly B12 given by Kris Mouton. Patient tolerated injection well.  Patient had B12 level drawn prior to the injection. Patient stated she missed some of her monthly appointments due to COVID and not wanting to be out more than need to be.  Also received Flu shot today.

## 2019-03-21 NOTE — Telephone Encounter (Signed)
I have placed an order for b12 lab, please see if she can have drawn. She recently broke her sacrum and maybe that is why she hasn't been in several months. OK to give dose today.

## 2019-03-22 LAB — VITAMIN B12: Vitamin B-12: 456 pg/mL (ref 211–911)

## 2019-03-27 ENCOUNTER — Other Ambulatory Visit: Payer: Self-pay

## 2019-03-27 ENCOUNTER — Ambulatory Visit
Admission: RE | Admit: 2019-03-27 | Discharge: 2019-03-27 | Disposition: A | Discharge status: Home / Self Care | Payer: PPO | Source: Ambulatory Visit | Attending: Family Medicine | Admitting: Family Medicine

## 2019-03-27 DIAGNOSIS — Z1231 Encounter for screening mammogram for malignant neoplasm of breast: Secondary | ICD-10-CM | POA: Diagnosis not present

## 2019-03-29 DIAGNOSIS — M545 Low back pain: Secondary | ICD-10-CM | POA: Diagnosis not present

## 2019-04-03 ENCOUNTER — Other Ambulatory Visit: Payer: Self-pay

## 2019-04-03 NOTE — Progress Notes (Signed)
Patient pre screened for office appointment, no questions or concerns today. Patient reminded of upcoming appointment time and date. 

## 2019-04-04 ENCOUNTER — Inpatient Hospital Stay (HOSPITAL_BASED_OUTPATIENT_CLINIC_OR_DEPARTMENT_OTHER): Payer: PPO | Admitting: Internal Medicine

## 2019-04-04 ENCOUNTER — Inpatient Hospital Stay: Payer: PPO

## 2019-04-04 ENCOUNTER — Inpatient Hospital Stay: Payer: PPO | Attending: Internal Medicine

## 2019-04-04 ENCOUNTER — Other Ambulatory Visit: Payer: Self-pay

## 2019-04-04 ENCOUNTER — Encounter: Payer: Self-pay | Admitting: Internal Medicine

## 2019-04-04 DIAGNOSIS — N183 Chronic kidney disease, stage 3 unspecified: Secondary | ICD-10-CM | POA: Diagnosis not present

## 2019-04-04 DIAGNOSIS — R5383 Other fatigue: Secondary | ICD-10-CM | POA: Diagnosis not present

## 2019-04-04 DIAGNOSIS — K5900 Constipation, unspecified: Secondary | ICD-10-CM | POA: Diagnosis not present

## 2019-04-04 DIAGNOSIS — D631 Anemia in chronic kidney disease: Secondary | ICD-10-CM | POA: Diagnosis not present

## 2019-04-04 DIAGNOSIS — Z87891 Personal history of nicotine dependence: Secondary | ICD-10-CM | POA: Diagnosis not present

## 2019-04-04 DIAGNOSIS — D649 Anemia, unspecified: Secondary | ICD-10-CM

## 2019-04-04 LAB — COMPREHENSIVE METABOLIC PANEL
ALT: 17 U/L (ref 0–44)
AST: 18 U/L (ref 15–41)
Albumin: 3.9 g/dL (ref 3.5–5.0)
Alkaline Phosphatase: 97 U/L (ref 38–126)
Anion gap: 7 (ref 5–15)
BUN: 21 mg/dL (ref 8–23)
CO2: 29 mmol/L (ref 22–32)
Calcium: 9.3 mg/dL (ref 8.9–10.3)
Chloride: 101 mmol/L (ref 98–111)
Creatinine, Ser: 1.1 mg/dL — ABNORMAL HIGH (ref 0.44–1.00)
GFR calc Af Amer: 58 mL/min — ABNORMAL LOW (ref >60)
GFR calc non Af Amer: 50 mL/min — ABNORMAL LOW (ref >60)
Glucose, Bld: 130 mg/dL — ABNORMAL HIGH (ref 70–99)
Potassium: 4.3 mmol/L (ref 3.5–5.1)
Sodium: 137 mmol/L (ref 135–145)
Total Bilirubin: 0.4 mg/dL (ref 0.3–1.2)
Total Protein: 7.1 g/dL (ref 6.5–8.1)

## 2019-04-04 LAB — IRON AND TIBC
Iron: 49 ug/dL (ref 28–170)
Saturation Ratios: 12 % (ref 10.4–31.8)
TIBC: 404 ug/dL (ref 250–450)
UIBC: 355 ug/dL

## 2019-04-04 LAB — CBC WITH DIFFERENTIAL/PLATELET
Abs Immature Granulocytes: 0.01 10*3/uL (ref 0.00–0.07)
Basophils Absolute: 0 10*3/uL (ref 0.0–0.1)
Basophils Relative: 1 %
Eosinophils Absolute: 0 10*3/uL (ref 0.0–0.5)
Eosinophils Relative: 0 %
HCT: 35.8 % — ABNORMAL LOW (ref 36.0–46.0)
Hemoglobin: 11.1 g/dL — ABNORMAL LOW (ref 12.0–15.0)
Immature Granulocytes: 0 %
Lymphocytes Relative: 22 %
Lymphs Abs: 1.2 10*3/uL (ref 0.7–4.0)
MCH: 28.8 pg (ref 26.0–34.0)
MCHC: 31 g/dL (ref 30.0–36.0)
MCV: 93 fL (ref 80.0–100.0)
Monocytes Absolute: 0.5 10*3/uL (ref 0.1–1.0)
Monocytes Relative: 9 %
Neutro Abs: 3.8 10*3/uL (ref 1.7–7.7)
Neutrophils Relative %: 68 %
Platelets: 281 10*3/uL (ref 150–400)
RBC: 3.85 MIL/uL — ABNORMAL LOW (ref 3.87–5.11)
RDW: 13.2 % (ref 11.5–15.5)
WBC: 5.5 10*3/uL (ref 4.0–10.5)
nRBC: 0 % (ref 0.0–0.2)

## 2019-04-04 LAB — FERRITIN: Ferritin: 14 ng/mL (ref 11–307)

## 2019-04-04 NOTE — Progress Notes (Signed)
[facility] CONSULT NOTE  Patient Care Team: Elby Beck, FNP as PCP - General (Nurse Practitioner) Juanito Doom, MD as Consulting Physician (Pulmonary Disease) Forest Gleason, MD (Inactive) as Consulting Physician (Oncology) Gatha Mayer, MD as Consulting Physician (Gastroenterology) Leandrew Koyanagi, MD as Referring Physician (Ophthalmology)  CHIEF COMPLAINTS/PURPOSE OF CONSULTATION: Anemia  # ANEMIA- sec to IDA/CKD [Dr.Lateef-hemoglobin 11.5/iron saturation 13% ferritin 30; intolerant to p.o. iron.]# colonoscopy-~ 2016; [not until 2024]  # CKD stage III  # Hx of smoking quit  Oncology History   No history exists.     HISTORY OF PRESENTING ILLNESS:  Kristie Byrd 73 y.o.  female longstanding history of chronic kidney disease stage III/ongoing anemia is here for a follow up.  Patient any blood in stools or black or stools.  Denies any nausea vomiting.  Complains of ongoing fatigue.  She is not on iron pills because of intolerance/constipation.  Review of Systems  Constitutional: Positive for malaise/fatigue. Negative for chills, diaphoresis, fever and weight loss.  HENT: Negative for nosebleeds and sore throat.   Eyes: Negative for double vision.  Respiratory: Negative for cough, hemoptysis, sputum production, shortness of breath and wheezing.   Cardiovascular: Negative for chest pain, palpitations, orthopnea and leg swelling.  Gastrointestinal: Positive for constipation. Negative for abdominal pain, blood in stool, diarrhea, heartburn, melena, nausea and vomiting.  Genitourinary: Negative for dysuria, frequency and urgency.  Musculoskeletal: Negative for back pain and joint pain.  Skin: Negative.  Negative for itching and rash.  Neurological: Negative for dizziness, tingling, focal weakness, weakness and headaches.  Endo/Heme/Allergies: Does not bruise/bleed easily.  Psychiatric/Behavioral: Negative for depression. The patient is not  nervous/anxious and does not have insomnia.      MEDICAL HISTORY:  Past Medical History:  Diagnosis Date  . Arthritis   . Cancer ([place])    cervix, skin  . Colon polyps   . COPD (chronic obstructive pulmonary disease) ([place])   . Depression   . GERD (gastroesophageal reflux disease)   . Headache   . Hypertension   . Iron deficiency anemia 09/27/2012  . PONV (postoperative nausea and vomiting)   . Shingles     SURGICAL HISTORY: Past Surgical History:  Procedure Laterality Date  . BREAST BIOPSY Bilateral   . CARPAL TUNNEL RELEASE Bilateral   . COLONOSCOPY  multiple  . KNEE ARTHROPLASTY Right 05/12/2016   Procedure: COMPUTER ASSISTED TOTAL KNEE ARTHROPLASTY;  Surgeon: Dereck Leep, MD;  Location: [facility] ORS;  Service: Orthopedics;  Laterality: Right;  . KNEE SURGERY Right   . NOSE SURGERY    . TONSILLECTOMY      SOCIAL HISTORY: Social History   Socioeconomic History  . Marital status: Single    Spouse name: Not on file  . Number of children: 2  . Years of education: Not on file  . Highest education level: Not on file  Occupational History  . Occupation: retired  Scientific laboratory technician  . Financial resource strain: Not on file  . Food insecurity    Worry: Not on file    Inability: Not on file  . Transportation needs    Medical: Not on file    Non-medical: Not on file  Tobacco Use  . Smoking status: Former Smoker    Packs/day: 1.00    Years: 37.00    Pack years: 37.00    Types: Cigarettes    Quit date: 11/15/1996    Years since quitting: 22.4  . Smokeless tobacco: Never Used  Substance and  Sexual Activity  . Alcohol use: No    Alcohol/week: 0.0 standard drinks  . Drug use: No    Comment: uses CBD oil   . Sexual activity: Never  Lifestyle  . Physical activity    Days per week: Not on file    Minutes per session: Not on file  . Stress: Not on file  Relationships  . Social Herbalist on phone: Not on file    Gets together: Not on file    Attends religious  service: Not on file    Active member of club or organization: Not on file    Attends meetings of clubs or organizations: Not on file    Relationship status: Not on file  . Intimate partner violence    Fear of current or ex partner: Not on file    Emotionally abused: Not on file    Physically abused: Not on file    Forced sexual activity: Not on file  Other Topics Concern  . Not on file  Social History Narrative   Lives alone.  Divorced. Two daughters- in 25s, 3 grandchildren.   Does not know family history- raised in [facility].        Desires CPR.  Does not have HPOA.   She would possibly want life support if for short period of time . Unsure about feeding tube.    FAMILY HISTORY: Family History  Family history unknown: Yes    ALLERGIES:  is allergic to codeine; demerol [meperidine]; fleet phospho-soda [sodium phosphates]; latex; other; and prednisone.  MEDICATIONS:  Current Outpatient Medications  Medication Sig Dispense Refill  . acetaminophen (TYLENOL) 650 MG CR tablet Take by mouth.    Marland Kitchen buPROPion (WELLBUTRIN XL) 300 MG 24 hr tablet TAKE 1 TABLET BY MOUTH ONCE DAILY. 90 tablet 3  . calcitRIOL (ROCALTROL) 0.25 MCG capsule Take 0.25 mcg by mouth daily.    . Cholecalciferol (VITAMIN D3) 1000 units CAPS Take 1,000 Units by mouth daily.    . cyanocobalamin (,VITAMIN B-12,) 1000 MCG/ML injection inject 1 milliliter intramuscularly every month 1 mL 3  . lisinopril (PRINIVIL,ZESTRIL) 5 MG tablet TAKE 1 TABLET BY MOUTH ONCE DAILY. 90 tablet 3  . Multiple Vitamin (MULTIVITAMIN) tablet Take 1 tablet by mouth at bedtime.     . NON FORMULARY HEMP OIL: DAILY    . sertraline (ZOLOFT) 50 MG tablet TAKE 1 TABLET BY MOUTH ONCE DAILY 90 tablet 2  . Turmeric 450 MG CAPS Take 1 capsule by mouth daily.    Marland Kitchen oxyCODONE-acetaminophen (PERCOCET/ROXICET) 5-325 MG tablet Take 1 tablet by mouth every 4 (four) hours as needed for severe pain. (Patient not taking: Reported on 04/03/2019) 20  tablet 0  . propranolol (INDERAL) 40 MG tablet TAKE 1 TABLET BY MOUTH TWICE DAILY 60 tablet 5   No current facility-administered medications for this visit.       Marland Kitchen  PHYSICAL EXAMINATION: ECOG PERFORMANCE STATUS: 1 - Symptomatic but completely ambulatory  Vitals:   04/04/19 1324  BP: 94/63  Pulse: 73  Temp: 97.7 F (36.5 C)   Filed Weights   04/04/19 1324  Weight: 232 lb (105.2 kg)    Physical Exam  Constitutional: She is oriented to person, place, and time and well-developed, well-nourished, and in no distress.  HENT:  Head: Normocephalic and atraumatic.  Mouth/Throat: Oropharynx is clear and moist. No oropharyngeal exudate.  Eyes: Pupils are equal, round, and reactive to light.  Neck: Normal range of motion. Neck  supple.  Cardiovascular: Normal rate and regular rhythm.  Pulmonary/Chest: No respiratory distress. She has no wheezes.  Abdominal: Soft. Bowel sounds are normal. She exhibits no distension and no mass. There is no abdominal tenderness. There is no rebound and no guarding.  Musculoskeletal: Normal range of motion.        General: No tenderness or edema.  Neurological: She is alert and oriented to person, place, and time.  Skin: Skin is warm.  Psychiatric: Affect normal.     LABORATORY DATA:  I have reviewed the data as listed Lab Results  Component Value Date   WBC 5.5 04/04/2019   HGB 11.1 (L) 04/04/2019   HCT 35.8 (L) 04/04/2019   MCV 93.0 04/04/2019   PLT 281 04/04/2019   Recent Labs    10/04/18 1343 04/04/19 1310  NA 137 137  K 4.4 4.3  CL 102 101  CO2 25 29  GLUCOSE 115* 130*  BUN 20 21  CREATININE 1.15* 1.10*  CALCIUM 9.2 9.3  GFRNONAA 48* 50*  GFRAA 55* 58*  PROT  --  7.1  ALBUMIN  --  3.9  AST  --  18  ALT  --  17  ALKPHOS  --  97  BILITOT  --  0.4    RADIOGRAPHIC STUDIES: I have personally reviewed the radiological images as listed and agreed with the findings in the report. Mm 3d Screen Breast Bilateral  Result Date:  03/28/2019 CLINICAL DATA:  Screening. EXAM: DIGITAL SCREENING BILATERAL MAMMOGRAM WITH TOMO AND CAD COMPARISON:  Previous exam(s). ACR Breast Density Category c: The breast tissue is heterogeneously dense, which may obscure small masses. FINDINGS: There are no findings suspicious for malignancy. Images were processed with CAD. IMPRESSION: No mammographic evidence of malignancy. A result letter of this screening mammogram will be mailed directly to the patient. RECOMMENDATION: Screening mammogram in one year. (Code:SM-B-01Y) BI-RADS CATEGORY  1: Negative. Electronically Signed   By: Margarette Canada M.D.   On: 03/28/2019 13:26    ASSESSMENT & PLAN:   Normocytic anemia    Anemia of chronic kidney failure, stage 3 (moderate) # Mild Anemia/IDA- sec to CKD stage III-hemoglobin 11.2; previously improved status post IV iron infusion. Reluctant with IV iron infusions [costs]. Recommend go back on iron pills [Aurxya from neprhology].   # CKD- stage III [Dr.Lateef]-GFR 46.  Followed by nephrology.MM  Panel- May 2020- NOrmal.   # DISPOSITION: # HOLD infusion  # follow up in 6 months-labs/cbc/bmp/possible Ferrahem-Dr.B   All questions were answered. The patient knows to call the clinic with any problems, questions or concerns.    Cammie Sickle, MD 04/17/2019 8:21 AM

## 2019-04-05 DIAGNOSIS — M2560 Stiffness of unspecified joint, not elsewhere classified: Secondary | ICD-10-CM | POA: Diagnosis not present

## 2019-04-05 DIAGNOSIS — R531 Weakness: Secondary | ICD-10-CM | POA: Diagnosis not present

## 2019-04-05 DIAGNOSIS — M545 Low back pain: Secondary | ICD-10-CM | POA: Diagnosis not present

## 2019-04-05 DIAGNOSIS — R269 Unspecified abnormalities of gait and mobility: Secondary | ICD-10-CM | POA: Diagnosis not present

## 2019-04-05 DIAGNOSIS — R293 Abnormal posture: Secondary | ICD-10-CM | POA: Diagnosis not present

## 2019-04-15 ENCOUNTER — Other Ambulatory Visit: Payer: Self-pay | Admitting: Family Medicine

## 2019-04-15 DIAGNOSIS — R251 Tremor, unspecified: Secondary | ICD-10-CM

## 2019-04-17 DIAGNOSIS — M545 Low back pain: Secondary | ICD-10-CM | POA: Diagnosis not present

## 2019-04-17 NOTE — Assessment & Plan Note (Signed)
#   Mild Anemia/IDA- sec to CKD stage III-hemoglobin 11.2; previously improved status post IV iron infusion. Reluctant with IV iron infusions [costs]. Recommend go back on iron pills [Aurxya from neprhology].   # CKD- stage III [Dr.Lateef]-GFR 46.  Followed by nephrology.MM  Panel- May 2020- NOrmal.   # DISPOSITION: # HOLD infusion  # follow up in 6 months-labs/cbc/bmp/possible Ferrahem-Dr.B

## 2019-04-22 DIAGNOSIS — N183 Chronic kidney disease, stage 3 unspecified: Secondary | ICD-10-CM | POA: Insufficient documentation

## 2019-04-23 DIAGNOSIS — I10 Essential (primary) hypertension: Secondary | ICD-10-CM | POA: Diagnosis not present

## 2019-04-23 DIAGNOSIS — N2581 Secondary hyperparathyroidism of renal origin: Secondary | ICD-10-CM | POA: Diagnosis not present

## 2019-04-23 DIAGNOSIS — N1832 Chronic kidney disease, stage 3b: Secondary | ICD-10-CM | POA: Diagnosis not present

## 2019-04-23 DIAGNOSIS — D631 Anemia in chronic kidney disease: Secondary | ICD-10-CM | POA: Diagnosis not present

## 2019-04-25 DIAGNOSIS — M545 Low back pain: Secondary | ICD-10-CM | POA: Diagnosis not present

## 2019-04-28 ENCOUNTER — Other Ambulatory Visit: Payer: Self-pay | Admitting: Family Medicine

## 2019-04-28 DIAGNOSIS — I10 Essential (primary) hypertension: Secondary | ICD-10-CM

## 2019-04-28 DIAGNOSIS — F419 Anxiety disorder, unspecified: Secondary | ICD-10-CM

## 2019-05-15 DIAGNOSIS — Z6841 Body Mass Index (BMI) 40.0 and over, adult: Secondary | ICD-10-CM | POA: Diagnosis not present

## 2019-05-15 DIAGNOSIS — Z96651 Presence of right artificial knee joint: Secondary | ICD-10-CM | POA: Diagnosis not present

## 2019-05-15 DIAGNOSIS — M1711 Unilateral primary osteoarthritis, right knee: Secondary | ICD-10-CM | POA: Diagnosis not present

## 2019-05-18 DIAGNOSIS — Z6841 Body Mass Index (BMI) 40.0 and over, adult: Secondary | ICD-10-CM | POA: Insufficient documentation

## 2019-05-30 DIAGNOSIS — M545 Low back pain: Secondary | ICD-10-CM | POA: Diagnosis not present

## 2019-06-06 DIAGNOSIS — M545 Low back pain: Secondary | ICD-10-CM | POA: Diagnosis not present

## 2019-06-13 DIAGNOSIS — M545 Low back pain: Secondary | ICD-10-CM | POA: Diagnosis not present

## 2019-06-20 ENCOUNTER — Telehealth: Payer: Self-pay

## 2019-06-20 ENCOUNTER — Other Ambulatory Visit: Payer: Self-pay | Admitting: Family Medicine

## 2019-06-20 MED ORDER — ALBUTEROL SULFATE HFA 108 (90 BASE) MCG/ACT IN AERS: 2.0000 | INHALATION_SPRAY | Freq: Four times a day (QID) | RESPIRATORY_TRACT | 1 refills | Status: DC | PRN

## 2019-06-20 NOTE — Telephone Encounter (Signed)
Patient called asking about albuterol inhaler refill. States she use to use this as needed but the nshe lost weight and did not have SOB problems. A few months ago she fractured her sacrum area/back area and is still using the cane to walk and has gained some weight so she noticed she is having SOB again with walking and doing certain activities. She is wondering if she can get that inhaler again. It is in her past medication list. No chest pain or any other cardiac issues.  Does patient need an appointment fist? LOV 03/09/2019

## 2019-06-20 NOTE — Telephone Encounter (Signed)
Called & spoke with pt about Debbie's message. Pt states she feels like she will be using the inhaler more than 1-2 times a week. States she has noticed a change in her breathing due to weight gain and the weight gain has caused her some trouble with walking with her cane. She requested to see Jackelyn Poling this week. Pt has been scheduled for this Friday.

## 2019-06-20 NOTE — Telephone Encounter (Signed)
Please call patient let her know that I have sent in a refill of her albuterol inhaler.  If she is needing to use it more than 1-2 times a week, she needs to come in to be evaluated for her breathing.

## 2019-06-22 ENCOUNTER — Ambulatory Visit (INDEPENDENT_AMBULATORY_CARE_PROVIDER_SITE_OTHER): Payer: PPO | Admitting: Family Medicine

## 2019-06-22 ENCOUNTER — Encounter: Payer: Self-pay | Admitting: Family Medicine

## 2019-06-22 ENCOUNTER — Other Ambulatory Visit: Payer: Self-pay

## 2019-06-22 VITALS — BP 118/72 | HR 87 | Temp 97.1°F | Resp 16 | Wt 238.0 lb

## 2019-06-22 DIAGNOSIS — R0600 Dyspnea, unspecified: Secondary | ICD-10-CM | POA: Diagnosis not present

## 2019-06-22 DIAGNOSIS — R0982 Postnasal drip: Secondary | ICD-10-CM

## 2019-06-22 DIAGNOSIS — E538 Deficiency of other specified B group vitamins: Secondary | ICD-10-CM | POA: Diagnosis not present

## 2019-06-22 DIAGNOSIS — R0609 Other forms of dyspnea: Secondary | ICD-10-CM

## 2019-06-22 MED ORDER — FLUTICASONE PROPIONATE 50 MCG/ACT NA SUSP: 2.0000 | Freq: Every day | NASAL | 6 refills | Status: DC

## 2019-06-22 MED ORDER — CYANOCOBALAMIN 1000 MCG/ML IJ SOLN
1000.0000 ug | Freq: Once | INTRAMUSCULAR | Status: AC
  Administered 2019-06-22: 10:00:00 1000 ug via INTRAMUSCULAR

## 2019-06-22 NOTE — Patient Instructions (Signed)
Good to see you today  Use your walker when walking long distances  Try fluticasone nasal spray 1 spray in each nostril nightly  Follow up in 8 weeks  Decrease sugars and grains, work on decreasing artificial sweeteners

## 2019-06-22 NOTE — Progress Notes (Signed)
Subjective:    Patient ID: Kristie Byrd, female    DOB: 02-19-1946, 74 y.o.   MRN: [id number]  HPI Chief Complaint  Patient presents with  . Shortness of Breath    with walking. x 2 months.   This is a 74 yo female who presents today with above cc. Having shortness of breath with walking. No wheezing, no cough, no palpitations, stopped smoking 18 years ago.  In the fall, she had a sacral fracture.  She was very limited in her activity and mobility.  She has been doing physical therapy for several months with significant improvement.  She has transitioned from walking with a walker to walking with a pronged cane.  She is recently returned on a very limited basis to work.  She does not have shortness of breath when she uses her walker, only when ambulating with her cane.  She does not have any shortness of breath at rest.  No wheeze, no cough, no chest pain.  She has put on weight and feels generally out of shape.  She awakens in the morning with significant amount of postnasal drainage.  Little nasal drainage.  She has a history of vitamin B12 deficiency and was getting her B12 injections at her pharmacist.  She has avoided going inside of the pharmacy due to the pandemic.  She would like her vitamin B12 injection here today.  Review of Systems Per HPI    Objective:   Physical Exam Vitals reviewed.  Constitutional:      General: She is not in acute distress.    Appearance: She is well-developed. She is obese. She is not ill-appearing, toxic-appearing or diaphoretic.  HENT:     Head: Normocephalic and atraumatic.  Eyes:     Conjunctiva/sclera: Conjunctivae normal.  Cardiovascular:     Rate and Rhythm: Normal rate and regular rhythm.     Heart sounds: Normal heart sounds.  Pulmonary:     Effort: Pulmonary effort is normal.     Breath sounds: Normal breath sounds.  Skin:    General: Skin is warm and dry.  Neurological:     Mental Status: She is alert and oriented to person,  place, and time.  Psychiatric:        Mood and Affect: Mood normal.        Behavior: Behavior normal.        Thought Content: Thought content normal.        Judgment: Judgment normal.       BP 118/72   Pulse 87   Temp (!) 97.1 F (36.2 C)   Resp 16   Wt 238 lb (108 kg)   SpO2 97%   BMI 40.85 kg/m  Wt Readings from Last 3 Encounters:  06/22/19 238 lb (108 kg)  04/04/19 232 lb (105.2 kg)  03/09/19 229 lb (103.9 kg)       Assessment & Plan:  1. DOE (dyspnea on exertion) -This is simply occurring when she uses her cane while walking long distances.  I suspect she is quite deconditioned following her fracture and decreased mobility.  Discussed using her walker as she does not have DOE with this.  Encouraged her to increase her activity level and work on making healthy food choices to reduce her weight. -Follow-up in 8 weeks, sooner if no improvement or worsening symptoms.  2. Post-nasal drainage - fluticasone (FLONASE) 50 MCG/ACT nasal spray; Place 2 sprays into both nostrils daily.  Dispense: 16 g; Refill: 6 -She has  a history of anxiety and palpitations with high dose oral steroids.  This occurred only after taking for several days.  We discussed possible reaction with fluticasone nasal spray and will start at low dose and monitor.  3. Vitamin B12 deficiency - cyanocobalamin ((VITAMIN B-12)) injection 1,000 mcg   Clarene Reamer, FNP-BC  [facility] [place]  06/26/2019 8:36 AM

## 2019-06-26 DIAGNOSIS — M545 Low back pain: Secondary | ICD-10-CM | POA: Diagnosis not present

## 2019-07-11 DIAGNOSIS — M545 Low back pain: Secondary | ICD-10-CM | POA: Diagnosis not present

## 2019-07-18 DIAGNOSIS — M545 Low back pain: Secondary | ICD-10-CM | POA: Diagnosis not present

## 2019-07-19 ENCOUNTER — Other Ambulatory Visit: Payer: Self-pay | Admitting: Family Medicine

## 2019-07-19 DIAGNOSIS — F419 Anxiety disorder, unspecified: Secondary | ICD-10-CM

## 2019-07-19 DIAGNOSIS — I10 Essential (primary) hypertension: Secondary | ICD-10-CM

## 2019-07-19 NOTE — Telephone Encounter (Signed)
Upcoming OV 08/13/19  Sertraline 04/30/19 #30 x 0 refills Bupropion 04/29/20 #30 x 0 refills.   Please advise, thanks.

## 2019-07-21 ENCOUNTER — Other Ambulatory Visit: Payer: Self-pay | Admitting: Family Medicine

## 2019-08-13 ENCOUNTER — Other Ambulatory Visit: Payer: Self-pay

## 2019-08-13 ENCOUNTER — Ambulatory Visit (INDEPENDENT_AMBULATORY_CARE_PROVIDER_SITE_OTHER): Payer: PPO | Admitting: Family Medicine

## 2019-08-13 ENCOUNTER — Encounter: Payer: Self-pay | Admitting: Family Medicine

## 2019-08-13 VITALS — BP 126/70 | HR 76 | Temp 98.8°F | Ht 64.0 in | Wt 239.0 lb

## 2019-08-13 DIAGNOSIS — N183 Chronic kidney disease, stage 3 unspecified: Secondary | ICD-10-CM

## 2019-08-13 DIAGNOSIS — I10 Essential (primary) hypertension: Secondary | ICD-10-CM

## 2019-08-13 DIAGNOSIS — E538 Deficiency of other specified B group vitamins: Secondary | ICD-10-CM

## 2019-08-13 DIAGNOSIS — J3089 Other allergic rhinitis: Secondary | ICD-10-CM | POA: Diagnosis not present

## 2019-08-13 DIAGNOSIS — D631 Anemia in chronic kidney disease: Secondary | ICD-10-CM | POA: Diagnosis not present

## 2019-08-13 DIAGNOSIS — D509 Iron deficiency anemia, unspecified: Secondary | ICD-10-CM

## 2019-08-13 DIAGNOSIS — Z6841 Body Mass Index (BMI) 40.0 and over, adult: Secondary | ICD-10-CM | POA: Diagnosis not present

## 2019-08-13 DIAGNOSIS — R251 Tremor, unspecified: Secondary | ICD-10-CM

## 2019-08-13 MED ORDER — SERTRALINE HCL 50 MG PO TABS: 50.0000 mg | ORAL_TABLET | Freq: Every day | ORAL | 3 refills | Status: DC

## 2019-08-13 MED ORDER — FERRIC CITRATE 1 GM 210 MG(FE) PO TABS: 420.0000 mg | ORAL_TABLET | Freq: Two times a day (BID) | ORAL | 5 refills | Status: DC

## 2019-08-13 MED ORDER — PROPRANOLOL HCL 40 MG PO TABS: 40.0000 mg | ORAL_TABLET | Freq: Two times a day (BID) | ORAL | 3 refills | Status: DC

## 2019-08-13 MED ORDER — LISINOPRIL 5 MG PO TABS: 5.0000 mg | ORAL_TABLET | Freq: Every day | ORAL | 3 refills | Status: DC

## 2019-08-13 NOTE — Progress Notes (Signed)
Subjective:    Patient ID: Kristie Byrd, female    DOB: [birth year], 74 y.o.   MRN: [id number]  HPI Chief Complaint  Patient presents with  . Follow-up   This is a 74 yo female who presents today for follow up of chronic medical conditions.   Daughter with recent colon cancer treatment, radiation, chemo, recent surgery.   Back pain- continues to have PT, pain with bending, stiffness, having to use cane. She is back at work 4-5 hours a day.   Mood starting to improve.   Allergic rhinitis- continued post nasal drainage, worse in am. Using fluticasone nasal spray.   Poor sleep- worse recently with daughter being sick. Sleeping some during the day.   DOE- had visit 06/28/19- she attributes this to increased weight gain, only with activity. Has gained weight with decreased activity with back pain.   Anemia of CKD- sees nephrology and hematology, upcoming hematology appointment 5/21 with labs prior.   Review of Systems Per HPI    Objective:   Physical Exam Vitals reviewed.  Constitutional:      Appearance: She is obese.     Comments: Appears younger than stated age.   HENT:     Head: Normocephalic and atraumatic.  Eyes:     Conjunctiva/sclera: Conjunctivae normal.  Cardiovascular:     Rate and Rhythm: Normal rate.  Pulmonary:     Effort: Pulmonary effort is normal.  Musculoskeletal:     Right lower leg: No edema.     Left lower leg: No edema.  Neurological:     Mental Status: She is alert and oriented to person, place, and time.  Psychiatric:        Mood and Affect: Mood normal.        Behavior: Behavior normal.        Thought Content: Thought content normal.        Judgment: Judgment normal.       BP 126/70 (BP Location: Left Arm, Patient Position: Sitting, Cuff Size: Normal)   Pulse 76   Temp 98.8 F (37.1 C) (Temporal)   Ht 5\' 4"  (1.626 m)   Wt 239 lb (108.4 kg)   SpO2 96%   BMI 41.02 kg/m  Wt Readings from Last 3 Encounters:  08/13/19 239 lb (108.4  kg)  06/22/19 238 lb (108 kg)  04/04/19 232 lb (105.2 kg)       Assessment & Plan:  1. Tremor - continue current treatment, tolerating well - propranolol (INDERAL) 40 MG tablet; Take 1 tablet (40 mg total) by mouth 2 (two) times daily.  Dispense: 180 tablet; Refill: 3  2. Essential hypertension - well controlled - lisinopril (ZESTRIL) 5 MG tablet; Take 1 tablet (5 mg total) by mouth daily.  Dispense: 90 tablet; Refill: 3  3. Iron deficiency anemia, unspecified iron deficiency anemia type - continued follow up with hematology - ferric citrate (AURYXIA) 1 GM 210 MG(Fe) tablet; Take 2 tablets (420 mg total) by mouth 2 (two) times daily with a meal.  Dispense: 120 tablet; Refill: 5  4. Anemia of chronic kidney failure, stage 3 (moderate) - continued follow up hematology/ nephrology - ferric citrate (AURYXIA) 1 GM 210 MG(Fe) tablet; Take 2 tablets (420 mg total) by mouth 2 (two) times daily with a meal.  Dispense: 120 tablet; Refill: 5  5. Seasonal allergic rhinitis due to other allergic trigger - can add otc long acting antihistamine prn  6. Morbid obesity, BMI 40.0- 44.9 - patient has been very  limited in activity with back issues, encouraged her to improve her meals, increase proteins and vegetables, meal planning, simple meals she can prepare ahead  - follow up in 6 months  This visit occurred during the SARS-CoV-2 public health emergency.  Safety protocols were in place, including screening questions prior to the visit, additional usage of staff PPE, and extensive cleaning of exam room while observing appropriate contact time as indicated for disinfecting solutions.      Clarene Reamer, FNP-BC  [facility]  08/13/2019 4:07 PM

## 2019-08-13 NOTE — Patient Instructions (Addendum)
  Over the counter Zyrtec or Claritin- generic is fine  Continue fluticasone nasal spray  Follow up in 6 months for complete physical exam

## 2019-08-14 ENCOUNTER — Telehealth: Payer: Self-pay

## 2019-08-14 DIAGNOSIS — M545 Low back pain: Secondary | ICD-10-CM | POA: Diagnosis not present

## 2019-08-14 NOTE — Telephone Encounter (Signed)
Pt left v/m that pt was seen on 08/13/19 and pt was told to take Claritin that it would not effect the kidney; on Claritin box it has to ck with doctor to see if needs dosage adjustment if has kidney disease. Pt has stage 3 chronic kidney disease. Pt wants to verify it is OK for pt to take the Claritin and if so how much and how often.

## 2019-08-15 ENCOUNTER — Telehealth: Payer: Self-pay

## 2019-08-15 ENCOUNTER — Encounter: Payer: Self-pay | Admitting: Family Medicine

## 2019-08-15 ENCOUNTER — Ambulatory Visit: Payer: PPO | Admitting: Family Medicine

## 2019-08-15 MED ORDER — CYANOCOBALAMIN 1000 MCG/ML IJ SOLN
1000.0000 ug | Freq: Once | INTRAMUSCULAR | Status: AC
  Administered 2019-08-13: 1000 ug via INTRAMUSCULAR

## 2019-08-15 NOTE — Telephone Encounter (Signed)
Please call patient and let her know that I verified the dose she is able to take based on her last kidney function test and she can take a full dose (10 mg) every 24 hours.

## 2019-08-15 NOTE — Telephone Encounter (Signed)
Patient has been notified of Debbie's comments and recommendations and she verbalized understanding.

## 2019-08-15 NOTE — Addendum Note (Signed)
Addended by: Sandria Bales B on: 08/15/2019 11:41 AM   Modules accepted: Orders

## 2019-08-15 NOTE — Telephone Encounter (Signed)
PA for ferric citrate was submitted through covermymeds and was denied. Did not give any further information but we should receive a fax about this from insurance that may have more information on it.

## 2019-08-20 NOTE — Telephone Encounter (Signed)
Please call patient and tell her that her prescription for Kristie Byrd was denied by her insurance company.  They are stating that she does meet the requirements of high phosphate level and being on dialysis.  Her nephrologist may have more success getting this approved for her and she should discuss with him when she follows up.  Denial sent to scan.

## 2019-08-21 DIAGNOSIS — H353131 Nonexudative age-related macular degeneration, bilateral, early dry stage: Secondary | ICD-10-CM | POA: Diagnosis not present

## 2019-08-21 DIAGNOSIS — H2513 Age-related nuclear cataract, bilateral: Secondary | ICD-10-CM | POA: Diagnosis not present

## 2019-08-21 NOTE — Telephone Encounter (Signed)
Patient aware to speak with Nephrologist tomorrow at her appt regarding getting this approved.   Pt wanted to let Jackelyn Poling know that she is NOT on Dialysis - she is only stage 3 so this has not been started yet. Wanted to make sure that this was removed from her chart if documented anywhere.   Nothing further needed.

## 2019-08-21 NOTE — Telephone Encounter (Signed)
Please call patient and let her know that there was a typo in my note- she does not meet the criteria since she is not on dialysis. Sorry for any confusion.

## 2019-08-21 NOTE — Telephone Encounter (Signed)
Pt aware. States that she is still going to mention it to her Nephrologist when she goes for her appt just in case they can still push to get it approved.   Nothing further needed.

## 2019-08-22 NOTE — Telephone Encounter (Signed)
Kristie Byrd with [employer] left a voicemai wanting to know if the appeal paperwork for Kristie Byrd has been received?  Kristie Byrd requested a call back with this information.  Called and spoke to [place] at [facility].  Tyrone Apple it looks like patient was advised that she does not meet the criteria for the medication.  Iona Beard requested a call back to confirm that appeal will not be pursued.  Ref #[id number].  Telephone number [phone]

## 2019-08-22 NOTE — Telephone Encounter (Signed)
Please call by [employer] and let them know that I have asked the patient to get medication prescribed through her nephrologist since he is the original prescriber. I will not be submitting an appeal. Patient is aware.

## 2019-08-23 DIAGNOSIS — D631 Anemia in chronic kidney disease: Secondary | ICD-10-CM | POA: Diagnosis not present

## 2019-08-23 DIAGNOSIS — N1832 Chronic kidney disease, stage 3b: Secondary | ICD-10-CM | POA: Diagnosis not present

## 2019-08-23 DIAGNOSIS — I10 Essential (primary) hypertension: Secondary | ICD-10-CM | POA: Diagnosis not present

## 2019-08-23 DIAGNOSIS — N2581 Secondary hyperparathyroidism of renal origin: Secondary | ICD-10-CM | POA: Diagnosis not present

## 2019-08-23 NOTE — Telephone Encounter (Signed)
Spoke with Collier Salina at [facility]- aware that our office is not going to be submitting the appeal.  Made them aware that this will be coming from the Nephrologist if they decide to use this medication.   Nothing further needed.

## 2019-09-06 DIAGNOSIS — M545 Low back pain: Secondary | ICD-10-CM | POA: Diagnosis not present

## 2019-09-19 ENCOUNTER — Other Ambulatory Visit: Payer: Self-pay | Admitting: Family Medicine

## 2019-09-19 DIAGNOSIS — F419 Anxiety disorder, unspecified: Secondary | ICD-10-CM

## 2019-09-26 DIAGNOSIS — M545 Low back pain: Secondary | ICD-10-CM | POA: Diagnosis not present

## 2019-10-02 ENCOUNTER — Other Ambulatory Visit: Payer: Self-pay | Admitting: *Deleted

## 2019-10-02 DIAGNOSIS — D649 Anemia, unspecified: Secondary | ICD-10-CM

## 2019-10-02 DIAGNOSIS — N183 Chronic kidney disease, stage 3 unspecified: Secondary | ICD-10-CM

## 2019-10-03 ENCOUNTER — Inpatient Hospital Stay (HOSPITAL_BASED_OUTPATIENT_CLINIC_OR_DEPARTMENT_OTHER): Payer: PPO | Admitting: Internal Medicine

## 2019-10-03 ENCOUNTER — Inpatient Hospital Stay: Payer: PPO | Attending: Internal Medicine

## 2019-10-03 ENCOUNTER — Other Ambulatory Visit: Payer: Self-pay

## 2019-10-03 ENCOUNTER — Inpatient Hospital Stay: Payer: PPO

## 2019-10-03 ENCOUNTER — Encounter: Payer: Self-pay | Admitting: Internal Medicine

## 2019-10-03 DIAGNOSIS — D631 Anemia in chronic kidney disease: Secondary | ICD-10-CM | POA: Diagnosis not present

## 2019-10-03 DIAGNOSIS — N183 Chronic kidney disease, stage 3 unspecified: Secondary | ICD-10-CM | POA: Diagnosis not present

## 2019-10-03 DIAGNOSIS — D649 Anemia, unspecified: Secondary | ICD-10-CM

## 2019-10-03 LAB — CBC WITH DIFFERENTIAL/PLATELET
Abs Immature Granulocytes: 0.02 10*3/uL (ref 0.00–0.07)
Basophils Absolute: 0 10*3/uL (ref 0.0–0.1)
Basophils Relative: 1 %
Eosinophils Absolute: 0 10*3/uL (ref 0.0–0.5)
Eosinophils Relative: 0 %
HCT: 35.9 % — ABNORMAL LOW (ref 36.0–46.0)
Hemoglobin: 11.6 g/dL — ABNORMAL LOW (ref 12.0–15.0)
Immature Granulocytes: 0 %
Lymphocytes Relative: 21 %
Lymphs Abs: 1.1 10*3/uL (ref 0.7–4.0)
MCH: 29.6 pg (ref 26.0–34.0)
MCHC: 32.3 g/dL (ref 30.0–36.0)
MCV: 91.6 fL (ref 80.0–100.0)
Monocytes Absolute: 0.5 10*3/uL (ref 0.1–1.0)
Monocytes Relative: 10 %
Neutro Abs: 3.6 10*3/uL (ref 1.7–7.7)
Neutrophils Relative %: 68 %
Platelets: 248 10*3/uL (ref 150–400)
RBC: 3.92 MIL/uL (ref 3.87–5.11)
RDW: 13.4 % (ref 11.5–15.5)
WBC: 5.3 10*3/uL (ref 4.0–10.5)
nRBC: 0 % (ref 0.0–0.2)

## 2019-10-03 LAB — BASIC METABOLIC PANEL
Anion gap: 9 (ref 5–15)
BUN: 22 mg/dL (ref 8–23)
CO2: 30 mmol/L (ref 22–32)
Calcium: 9.5 mg/dL (ref 8.9–10.3)
Chloride: 99 mmol/L (ref 98–111)
Creatinine, Ser: 1.13 mg/dL — ABNORMAL HIGH (ref 0.44–1.00)
GFR calc Af Amer: 56 mL/min — ABNORMAL LOW (ref >60)
GFR calc non Af Amer: 49 mL/min — ABNORMAL LOW (ref >60)
Glucose, Bld: 106 mg/dL — ABNORMAL HIGH (ref 70–99)
Potassium: 4.3 mmol/L (ref 3.5–5.1)
Sodium: 138 mmol/L (ref 135–145)

## 2019-10-03 MED ORDER — SODIUM CHLORIDE 0.9 % IV SOLN
510.0000 mg | Freq: Once | INTRAVENOUS | Status: AC
  Administered 2019-10-03: 510 mg via INTRAVENOUS
  Filled 2019-10-03: qty 17

## 2019-10-03 MED ORDER — SODIUM CHLORIDE 0.9 % IV SOLN
Freq: Once | INTRAVENOUS | Status: AC
  Filled 2019-10-03: qty 250

## 2019-10-03 NOTE — Progress Notes (Signed)
Patient here for oncology follow-up appointment, expresses no complaints or concerns at this time.    

## 2019-10-03 NOTE — Progress Notes (Signed)
[employer] CONSULT NOTE  Patient Care Team: Elby Beck, FNP as PCP - General (Nurse Practitioner) Juanito Doom, MD as Consulting Physician (Pulmonary Disease) Forest Gleason, MD (Inactive) as Consulting Physician (Oncology) Gatha Mayer, MD as Consulting Physician (Gastroenterology) Leandrew Koyanagi, MD as Referring Physician (Ophthalmology)  CHIEF COMPLAINTS/PURPOSE OF CONSULTATION: Anemia  # ANEMIA- sec to IDA/CKD [Dr.Lateef-hemoglobin 11.5/iron saturation 13% ferritin 30; intolerant to p.o. iron.]# colonoscopy-~ 2016; [not until 2024]  # CKD stage III  # Hx of smoking quit  Oncology History   No history exists.     HISTORY OF PRESENTING ILLNESS:  Kristie Byrd 74 y.o.  female longstanding history of chronic kidney disease stage III/ongoing anemia is here for a follow up.  No nausea no vomiting.  Chronic ongoing fatigue.  No blood in stools or black or stools.  Review of Systems  Constitutional: Positive for malaise/fatigue. Negative for chills, diaphoresis, fever and weight loss.  HENT: Negative for nosebleeds and sore throat.   Eyes: Negative for double vision.  Respiratory: Negative for cough, hemoptysis, sputum production, shortness of breath and wheezing.   Cardiovascular: Negative for chest pain, palpitations, orthopnea and leg swelling.  Gastrointestinal: Positive for constipation. Negative for abdominal pain, blood in stool, diarrhea, heartburn, melena, nausea and vomiting.  Genitourinary: Negative for dysuria, frequency and urgency.  Musculoskeletal: Negative for back pain and joint pain.  Skin: Negative.  Negative for itching and rash.  Neurological: Negative for dizziness, tingling, focal weakness, weakness and headaches.  Endo/Heme/Allergies: Does not bruise/bleed easily.  Psychiatric/Behavioral: Negative for depression. The patient is not nervous/anxious and does not have insomnia.      MEDICAL HISTORY:  Past Medical  History:  Diagnosis Date  . Arthritis   . Cancer ([place])    cervix, skin  . Colon polyps   . COPD (chronic obstructive pulmonary disease) ([place])   . Depression   . GERD (gastroesophageal reflux disease)   . Headache   . Hypertension   . Iron deficiency anemia 09/27/2012  . PONV (postoperative nausea and vomiting)   . Shingles     SURGICAL HISTORY: Past Surgical History:  Procedure Laterality Date  . BREAST BIOPSY Bilateral   . CARPAL TUNNEL RELEASE Bilateral   . COLONOSCOPY  multiple  . KNEE ARTHROPLASTY Right 05/12/2016   Procedure: COMPUTER ASSISTED TOTAL KNEE ARTHROPLASTY;  Surgeon: Dereck Leep, MD;  Location: [facility] ORS;  Service: Orthopedics;  Laterality: Right;  . KNEE SURGERY Right   . NOSE SURGERY    . TONSILLECTOMY      SOCIAL HISTORY: Social History   Socioeconomic History  . Marital status: Single    Spouse name: Not on file  . Number of children: 2  . Years of education: Not on file  . Highest education level: Not on file  Occupational History  . Occupation: retired  Tobacco Use  . Smoking status: Former Smoker    Packs/day: 1.00    Years: 37.00    Pack years: 37.00    Types: Cigarettes    Quit date: 11/15/1996    Years since quitting: 22.8  . Smokeless tobacco: Never Used  Substance and Sexual Activity  . Alcohol use: No    Alcohol/week: 0.0 standard drinks  . Drug use: No    Comment: uses CBD oil   . Sexual activity: Never  Other Topics Concern  . Not on file  Social History Narrative   Lives alone.  Divorced. Two daughters- in 65s, 3 grandchildren.  Does not know family history- raised in [facility].        Desires CPR.  Does not have HPOA.   She would possibly want life support if for short period of time . Unsure about feeding tube.   Social Determinants of Health   Financial Resource Strain:   . Difficulty of Paying Living Expenses:   Food Insecurity:   . Worried About Charity fundraiser in the Last Year:   . Academic librarian in the Last Year:   Transportation Needs:   . Film/video editor (Medical):   Marland Kitchen Lack of Transportation (Non-Medical):   Physical Activity:   . Days of Exercise per Week:   . Minutes of Exercise per Session:   Stress:   . Feeling of Stress :   Social Connections:   . Frequency of Communication with Friends and Family:   . Frequency of Social Gatherings with Friends and Family:   . Attends Religious Services:   . Active Member of Clubs or Organizations:   . Attends Archivist Meetings:   Marland Kitchen Marital Status:   Intimate Partner Violence:   . Fear of Current or Ex-Partner:   . Emotionally Abused:   Marland Kitchen Physically Abused:   . Sexually Abused:     FAMILY HISTORY: Family History  Family history unknown: Yes    ALLERGIES:  is allergic to codeine; demerol [meperidine]; fleet phospho-soda [sodium phosphates]; latex; other; and prednisone.  MEDICATIONS:  Current Outpatient Medications  Medication Sig Dispense Refill  . acetaminophen (TYLENOL) 650 MG CR tablet Take by mouth.    Marland Kitchen buPROPion (WELLBUTRIN XL) 300 MG 24 hr tablet TAKE 1 TABLET BY MOUTH ONCE DAILY 30 tablet 1  . calcitRIOL (ROCALTROL) 0.25 MCG capsule Take 0.25 mcg by mouth daily.    . Cholecalciferol (VITAMIN D3) 1000 units CAPS Take 1,000 Units by mouth daily.    . ferric citrate (AURYXIA) 1 GM 210 MG(Fe) tablet Take 2 tablets (420 mg total) by mouth 2 (two) times daily with a meal. 120 tablet 5  . fluticasone (FLONASE) 50 MCG/ACT nasal spray Place 2 sprays into both nostrils daily. 16 g 6  . lisinopril (ZESTRIL) 5 MG tablet Take 1 tablet (5 mg total) by mouth daily. 90 tablet 3  . Multiple Vitamin (MULTIVITAMIN) tablet Take 1 tablet by mouth at bedtime.     . propranolol (INDERAL) 40 MG tablet Take 1 tablet (40 mg total) by mouth 2 (two) times daily. 180 tablet 3  . sertraline (ZOLOFT) 50 MG tablet Take 1 tablet (50 mg total) by mouth daily. 90 tablet 3  . Turmeric 450 MG CAPS Take 1 capsule by mouth daily.     Marland Kitchen albuterol (VENTOLIN HFA) 108 (90 Base) MCG/ACT inhaler INHALE 2 PUFFS EVERY 6 HOURS FOR WHEEZING OR SHORTNESS OF BREATH AS NEEDED 8.5 g 1  . cyanocobalamin (,VITAMIN B-12,) 1000 MCG/ML injection inject 1 milliliter intramuscularly every month (Patient not taking: Reported on 10/03/2019) 1 mL 3  . NON FORMULARY HEMP OIL: DAILY     No current facility-administered medications for this visit.      Marland Kitchen  PHYSICAL EXAMINATION: ECOG PERFORMANCE STATUS: 1 - Symptomatic but completely ambulatory  Vitals:   10/03/19 1357  BP: 125/79  Pulse: 63  Resp: 16  Temp: 97.6 F (36.4 C)  SpO2: 95%   Filed Weights   10/03/19 1357  Weight: 239 lb 6.4 oz (108.6 kg)    Physical Exam  Constitutional: She is  oriented to person, place, and time and well-developed, well-nourished, and in no distress.  HENT:  Head: Normocephalic and atraumatic.  Mouth/Throat: Oropharynx is clear and moist. No oropharyngeal exudate.  Eyes: Pupils are equal, round, and reactive to light.  Cardiovascular: Normal rate and regular rhythm.  Pulmonary/Chest: No respiratory distress. She has no wheezes.  Abdominal: Soft. Bowel sounds are normal. She exhibits no distension and no mass. There is no abdominal tenderness. There is no rebound and no guarding.  Musculoskeletal:        General: No tenderness or edema. Normal range of motion.     Cervical back: Normal range of motion and neck supple.  Neurological: She is alert and oriented to person, place, and time.  Skin: Skin is warm.  Psychiatric: Affect normal.     LABORATORY DATA:  I have reviewed the data as listed Lab Results  Component Value Date   WBC 5.3 10/03/2019   HGB 11.6 (L) 10/03/2019   HCT 35.9 (L) 10/03/2019   MCV 91.6 10/03/2019   PLT 248 10/03/2019   Recent Labs    10/04/18 1343 04/04/19 1310 10/03/19 1324  NA 137 137 138  K 4.4 4.3 4.3  CL 102 101 99  CO2 25 29 30   GLUCOSE 115* 130* 106*  BUN 20 21 22   CREATININE 1.15* 1.10* 1.13*   CALCIUM 9.2 9.3 9.5  GFRNONAA 48* 50* 49*  GFRAA 55* 58* 56*  PROT  --  7.1  --   ALBUMIN  --  3.9  --   AST  --  18  --   ALT  --  17  --   ALKPHOS  --  97  --   BILITOT  --  0.4  --     RADIOGRAPHIC STUDIES: I have personally reviewed the radiological images as listed and agreed with the findings in the report. No results found.  ASSESSMENT & PLAN:   Anemia of chronic kidney failure, stage 3 (moderate) # Mild Anemia/IDA- sec to CKD stage III-hemoglobin 11.8; Proceed with IV ferrahem; [costs].  Iron tablets: Constipation [Aurxya from neprhology].   # CKD- stage III [Dr.Lateef]-GFR 46.  Followed by nephrology.MM  Panel- May 2020- NOrmal.   # DISPOSITION: # Ferrahem infusion  # follow up in 6 months-labs/cbc/bmpiron studies/fertitin-/possible Ferrahem-Dr.B   All questions were answered. The patient knows to call the clinic with any problems, questions or concerns.    Cammie Sickle, MD 10/03/2019 5:33 PM

## 2019-10-03 NOTE — Assessment & Plan Note (Addendum)
#   Mild Anemia/IDA- sec to CKD stage III-hemoglobin 11.8; Proceed with IV ferrahem; [costs].  Iron tablets: Constipation [Aurxya from neprhology].   # CKD- stage III [Dr.Lateef]-GFR 46.  Followed by nephrology.MM  Panel- May 2020- NOrmal.   # DISPOSITION: # Ferrahem infusion  # follow up in 6 months-labs/cbc/bmpiron studies/fertitin-/possible Ferrahem-Dr.B

## 2019-10-13 ENCOUNTER — Other Ambulatory Visit: Payer: Self-pay | Admitting: Family Medicine

## 2019-10-13 DIAGNOSIS — F419 Anxiety disorder, unspecified: Secondary | ICD-10-CM

## 2019-10-16 NOTE — Telephone Encounter (Signed)
Just sent in 09/19/19 for #30 x 1 refill  Pt is requesting 90 days supply to her pharmacy instead of 30 day   Please advise, thanks.

## 2019-12-11 ENCOUNTER — Other Ambulatory Visit: Payer: Self-pay

## 2019-12-27 DIAGNOSIS — D631 Anemia in chronic kidney disease: Secondary | ICD-10-CM | POA: Diagnosis not present

## 2019-12-27 DIAGNOSIS — I10 Essential (primary) hypertension: Secondary | ICD-10-CM | POA: Diagnosis not present

## 2019-12-27 DIAGNOSIS — N2581 Secondary hyperparathyroidism of renal origin: Secondary | ICD-10-CM | POA: Diagnosis not present

## 2019-12-27 DIAGNOSIS — N1831 Chronic kidney disease, stage 3a: Secondary | ICD-10-CM | POA: Diagnosis not present

## 2020-03-05 ENCOUNTER — Other Ambulatory Visit: Payer: Self-pay | Admitting: Family Medicine

## 2020-03-05 DIAGNOSIS — F419 Anxiety disorder, unspecified: Secondary | ICD-10-CM

## 2020-03-28 ENCOUNTER — Ambulatory Visit: Payer: PPO | Admitting: Family Medicine

## 2020-03-31 ENCOUNTER — Encounter: Payer: Self-pay | Admitting: Family Medicine

## 2020-03-31 ENCOUNTER — Other Ambulatory Visit: Payer: Self-pay

## 2020-03-31 ENCOUNTER — Ambulatory Visit (INDEPENDENT_AMBULATORY_CARE_PROVIDER_SITE_OTHER): Payer: PPO | Admitting: Family Medicine

## 2020-03-31 VITALS — BP 124/82 | HR 70 | Temp 97.6°F | Ht 64.0 in | Wt 239.0 lb

## 2020-03-31 DIAGNOSIS — N183 Chronic kidney disease, stage 3 unspecified: Secondary | ICD-10-CM

## 2020-03-31 DIAGNOSIS — Z1211 Encounter for screening for malignant neoplasm of colon: Secondary | ICD-10-CM | POA: Diagnosis not present

## 2020-03-31 DIAGNOSIS — F5104 Psychophysiologic insomnia: Secondary | ICD-10-CM

## 2020-03-31 DIAGNOSIS — N1831 Chronic kidney disease, stage 3a: Secondary | ICD-10-CM

## 2020-03-31 DIAGNOSIS — D631 Anemia in chronic kidney disease: Secondary | ICD-10-CM

## 2020-03-31 DIAGNOSIS — Z23 Encounter for immunization: Secondary | ICD-10-CM | POA: Diagnosis not present

## 2020-03-31 DIAGNOSIS — E2839 Other primary ovarian failure: Secondary | ICD-10-CM | POA: Diagnosis not present

## 2020-03-31 DIAGNOSIS — Z6841 Body Mass Index (BMI) 40.0 and over, adult: Secondary | ICD-10-CM

## 2020-03-31 NOTE — Patient Instructions (Addendum)
Can try melatonin for sleep- 2-3 mg  I have put in a referral to [facility], female provider   There is not one right eating plan for everyone.  It may take trial and error to find what will work for you.  It is important to get adequate protein and fiber with your meals.  It is okay to not eat breakfast or to skip meals if you are not hungry.  Avoid snacking between meals.  Unless you are on a fluid restriction, drink 80 to 90 ounces of water a day.  Suggested resources- www.dietdoctor.com/diabetes/diet www.adaptyourlifeacademy.com-there is a quiz to help you determine how many carbohydrates you should eat a day  www.thefastingmethod.com  Here are some guidelines to help you with meal planning -  Avoid all processed and packaged foods (bread, pasta, crackers, chips, etc) and beverages containing calories.  Avoid added sugars and excessive natural sugars.  Pay attention to how you feel if you consume artificial sweeteners.  Do they make you more hungry or raise your blood sugar?  With every meal and snack, aim to get 20 g of protein (3 ounces of meat, 4 ounces of fish, 3 eggs, protein powder, 1 cup [place] yogurt, 1 cup cottage cheese, etc.)  Increase fiber in the form of non-starchy vegetables.  These help you feel full with very little carbohydrates and are good for gut health.  Nonstarchy vegetables include summer squash, onions, peppers, tomatoes, eggplant, broccoli, cauliflower, cabbage, lettuce, spinach.  Have small amounts of good fats such as avocado, nuts, olive oil, nut butters, olives.  Add a little cheese to your meals to make them tasty.   Try to plan your meals for the week and do some meal preparation when able.  If possible, make lunches for the week ahead of time.  Plan a couple of dinners and make enough so you can have leftovers.  Build in a treat once a week.     Influenza (Flu) Vaccine (Inactivated or Recombinant): What You Need to Know 1. Why get vaccinated? Influenza  vaccine can prevent influenza (flu). Flu is a contagious disease that spreads around the [place] every year, usually between October and May. Anyone can get the flu, but it is more dangerous for some people. Infants and young children, people 58 years of age and older, pregnant women, and people with certain health conditions or a weakened immune system are at greatest risk of flu complications. Pneumonia, bronchitis, sinus infections and ear infections are examples of flu-related complications. If you have a medical condition, such as heart disease, cancer or diabetes, flu can make it worse. Flu can cause fever and chills, sore throat, muscle aches, fatigue, cough, headache, and runny or stuffy nose. Some people may have vomiting and diarrhea, though this is more common in children than adults. Each year thousands of people in the [place] die from flu, and many more are hospitalized. Flu vaccine prevents millions of illnesses and flu-related visits to the doctor each year. 2. Influenza vaccine CDC recommends everyone 85 months of age and older get vaccinated every flu season. Children 6 months through 75 years of age may need 2 doses during a single flu season. Everyone else needs only 1 dose each flu season. It takes about 2 weeks for protection to develop after vaccination. There are many flu viruses, and they are always changing. Each year a new flu vaccine is made to protect against three or four viruses that are likely to cause disease in the  upcoming flu season. Even when the vaccine doesn't exactly match these viruses, it may still provide some protection. Influenza vaccine does not cause flu. Influenza vaccine may be given at the same time as other vaccines. 3. Talk with your health care provider Tell your vaccine provider if the person getting the vaccine:  Has had an allergic reaction after a previous dose of influenza vaccine, or has any severe, life-threatening allergies.  Has  ever had Guillain-Barr Syndrome (also called GBS). In some cases, your health care provider may decide to postpone influenza vaccination to a future visit. People with minor illnesses, such as a cold, may be vaccinated. People who are moderately or severely ill should usually wait until they recover before getting influenza vaccine. Your health care provider can give you more information. 4. Risks of a vaccine reaction  Soreness, redness, and swelling where shot is given, fever, muscle aches, and headache can happen after influenza vaccine.  There may be a very small increased risk of Guillain-Barr Syndrome (GBS) after inactivated influenza vaccine (the flu shot). Young children who get the flu shot along with pneumococcal vaccine (PCV13), and/or DTaP vaccine at the same time might be slightly more likely to have a seizure caused by fever. Tell your health care provider if a child who is getting flu vaccine has ever had a seizure. People sometimes faint after medical procedures, including vaccination. Tell your provider if you feel dizzy or have vision changes or ringing in the ears. As with any medicine, there is a very remote chance of a vaccine causing a severe allergic reaction, other serious injury, or death. 5. What if there is a serious problem? An allergic reaction could occur after the vaccinated person leaves the clinic. If you see signs of a severe allergic reaction (hives, swelling of the face and throat, difficulty breathing, a fast heartbeat, dizziness, or weakness), call 9-1-1 and get the person to the nearest hospital. For other signs that concern you, call your health care provider. Adverse reactions should be reported to the Vaccine Adverse Event Reporting System (VAERS). Your health care provider will usually file this report, or you can do it yourself. Visit the VAERS website at www.vaers.SamedayNews.es or call (734)338-6499.VAERS is only for reporting reactions, and VAERS staff do not  give medical advice. 6. The National Vaccine Injury Compensation Program The [employer] Vaccine Injury Compensation Program (VICP) is a federal program that was created to compensate people who may have been injured by certain vaccines. Visit the VICP website at GoldCloset.com.ee or call (423)331-7606 to learn about the program and about filing a claim. There is a time limit to file a claim for compensation. 7. How can I learn more?  Ask your healthcare provider.  Call your local or state health department.  Contact the Centers for Disease Control and Prevention (CDC): ? Call 867-319-2992 (1-800-CDC-INFO) or ? Visit CDC's https://gibson.com/ Vaccine Information Statement (Interim) Inactivated Influenza Vaccine (12/29/2017) This information is not intended to replace advice given to you by your health care provider. Make sure you discuss any questions you have with your health care provider. Document Revised: 08/22/2018 Document Reviewed: 01/02/2018 Elsevier Patient Education  [place].

## 2020-03-31 NOTE — Progress Notes (Signed)
Subjective:    Patient ID: Kristie Byrd, female    DOB: 09-28-1945, 74 y.o.   MRN: [id number]  HPI Chief Complaint  Patient presents with   Medication Refill   Has been doing ok, still working 4 hours a day, still with back pain.  Ambulating okay with either walker or cane.  No recent falls.  Not sleeping well. Falls asleep ok, if she wakes up, can't go back to sleep. Dogs wake her up.   Due for colonoscopy.  Would like to go to [place] and have female provider.   Review of Systems Denies headaches, visual changes, chest pain, shortness of breath, abdominal pain, diarrhea/constipation    Objective:   Physical Exam Physical Exam  Constitutional: Oriented to person, place, and time. Appears well-developed and well-nourished. Obese.  HENT:  Head: Normocephalic and atraumatic.  Eyes: Conjunctivae are normal.  Neck: Normal range of motion. Neck supple.  Cardiovascular: Normal rate, regular rhythm and normal heart sounds.   Pulmonary/Chest: Effort normal and breath sounds normal.  Musculoskeletal: No lower extremity edema.   Neurological: Alert and oriented to person, place, and time.  Skin: Skin is warm and dry.  Psychiatric: Normal mood and affect. Behavior is normal. Judgment and thought content normal.  Vitals reviewed.    BP 124/82    Pulse 70    Temp 97.6 F (36.4 C) (Temporal)    Ht 5\' 4"  (1.626 m)    Wt 239 lb (108.4 kg)    SpO2 95%    BMI 41.02 kg/m  Wt Readings from Last 3 Encounters:  03/31/20 239 lb (108.4 kg)  10/03/19 239 lb 6.4 oz (108.6 kg)  08/13/19 239 lb (108.4 kg)   Depression screen [facility] 2/9 03/31/2020 08/28/2018 11/30/2016 07/05/2016 05/27/2015  Decreased Interest 0 0 0 0 0  Down, Depressed, Hopeless 0 0 0 0 0  PHQ - 2 Score 0 0 0 0 0  Altered sleeping 2 0 - - -  Tired, decreased energy 1 0 - - -  Change in appetite 0 0 - - -  Feeling bad or failure about yourself  0 0 - - -  Trouble concentrating 0 0 - - -  Moving slowly or fidgety/restless 0 0  - - -  Suicidal thoughts 0 0 - - -  PHQ-9 Score 3 0 - - -  Difficult doing work/chores - Not difficult at all - - -        Assessment & Plan:  1. Screening for colon cancer - Ambulatory referral to Gastroenterology  2. Need for immunization against influenza - Flu Vaccine QUAD High Dose(Fluad)  3. Estrogen deficiency - patient will call to schedule with mammogram - DG Bone Density; Future  4. BMI 40.0-44.9, adult [facility]) -Discussed barriers to healthy food choices and difficulty with ambulation due to back pain -Provided written and verbal information regarding healthy food choices  5. Stage 3a chronic kidney disease (HCC) -Stable, followed by nephrology  6. Anemia of chronic kidney failure, stage 3 (moderate) -Has upcoming appointment for labs and possible Feraheme infusion by hematology  7. Psychophysiological insomnia -Discussed sleep hygiene, she denies worsening of mood, will have her try low-dose melatonin along with behavioral modifications  This visit occurred during the SARS-CoV-2 public health emergency.  Safety protocols were in place, including screening questions prior to the visit, additional usage of staff PPE, and extensive cleaning of exam room while observing appropriate contact time as indicated for disinfecting solutions.      Neoma Laming  Carlean Purl, FNP-BC  [facility], [place]  03/31/2020 5:19 PM

## 2020-04-01 ENCOUNTER — Telehealth: Payer: Self-pay | Admitting: *Deleted

## 2020-04-01 DIAGNOSIS — Z13220 Encounter for screening for lipoid disorders: Secondary | ICD-10-CM

## 2020-04-01 NOTE — Telephone Encounter (Signed)
Lab added

## 2020-04-01 NOTE — Telephone Encounter (Signed)
-----   Message from Cammie Sickle, MD sent at 03/31/2020  7:42 PM EST ----- Regarding: RE: would you please add lab Sure, will be happy to add lipid panel.  H/T- please add fasting lipid panel-Thanks GB ----- Message ----- From: Elby Beck, FNP Sent: 03/31/2020   5:17 PM EST To: Cammie Sickle, MD Subject: would you please add lab                       Patient having blood work with you on 04/02/20, would you mind adding a lipid panel?  Thanks so much! Warm regards,  Tor Netters, FNP-BC

## 2020-04-02 ENCOUNTER — Inpatient Hospital Stay (HOSPITAL_BASED_OUTPATIENT_CLINIC_OR_DEPARTMENT_OTHER): Payer: PPO | Admitting: Internal Medicine

## 2020-04-02 ENCOUNTER — Inpatient Hospital Stay: Payer: PPO | Attending: Internal Medicine

## 2020-04-02 ENCOUNTER — Encounter: Payer: Self-pay | Admitting: Internal Medicine

## 2020-04-02 ENCOUNTER — Inpatient Hospital Stay: Payer: PPO

## 2020-04-02 VITALS — BP 132/71 | HR 63

## 2020-04-02 DIAGNOSIS — Z79899 Other long term (current) drug therapy: Secondary | ICD-10-CM | POA: Insufficient documentation

## 2020-04-02 DIAGNOSIS — N183 Chronic kidney disease, stage 3 unspecified: Secondary | ICD-10-CM | POA: Diagnosis not present

## 2020-04-02 DIAGNOSIS — D631 Anemia in chronic kidney disease: Secondary | ICD-10-CM

## 2020-04-02 DIAGNOSIS — Z13220 Encounter for screening for lipoid disorders: Secondary | ICD-10-CM

## 2020-04-02 LAB — FERRITIN: Ferritin: 19 ng/mL (ref 11–307)

## 2020-04-02 LAB — CBC WITH DIFFERENTIAL/PLATELET
Abs Immature Granulocytes: 0.02 10*3/uL (ref 0.00–0.07)
Basophils Absolute: 0 10*3/uL (ref 0.0–0.1)
Basophils Relative: 0 %
Eosinophils Absolute: 0 10*3/uL (ref 0.0–0.5)
Eosinophils Relative: 0 %
HCT: 35.5 % — ABNORMAL LOW (ref 36.0–46.0)
Hemoglobin: 11.4 g/dL — ABNORMAL LOW (ref 12.0–15.0)
Immature Granulocytes: 0 %
Lymphocytes Relative: 21 %
Lymphs Abs: 1.1 10*3/uL (ref 0.7–4.0)
MCH: 30 pg (ref 26.0–34.0)
MCHC: 32.1 g/dL (ref 30.0–36.0)
MCV: 93.4 fL (ref 80.0–100.0)
Monocytes Absolute: 0.6 10*3/uL (ref 0.1–1.0)
Monocytes Relative: 11 %
Neutro Abs: 3.5 10*3/uL (ref 1.7–7.7)
Neutrophils Relative %: 68 %
Platelets: 238 10*3/uL (ref 150–400)
RBC: 3.8 MIL/uL — ABNORMAL LOW (ref 3.87–5.11)
RDW: 13.5 % (ref 11.5–15.5)
WBC: 5.2 10*3/uL (ref 4.0–10.5)
nRBC: 0 % (ref 0.0–0.2)

## 2020-04-02 LAB — BASIC METABOLIC PANEL
Anion gap: 8 (ref 5–15)
BUN: 19 mg/dL (ref 8–23)
CO2: 33 mmol/L — ABNORMAL HIGH (ref 22–32)
Calcium: 9.7 mg/dL (ref 8.9–10.3)
Chloride: 99 mmol/L (ref 98–111)
Creatinine, Ser: 1.09 mg/dL — ABNORMAL HIGH (ref 0.44–1.00)
GFR, Estimated: 54 mL/min — ABNORMAL LOW (ref >60)
Glucose, Bld: 113 mg/dL — ABNORMAL HIGH (ref 70–99)
Potassium: 4.2 mmol/L (ref 3.5–5.1)
Sodium: 140 mmol/L (ref 135–145)

## 2020-04-02 LAB — LIPID PANEL
Cholesterol: 191 mg/dL (ref 0–200)
HDL: 58 mg/dL (ref >40)
LDL Cholesterol: 96 mg/dL (ref 0–99)
Total CHOL/HDL Ratio: 3.3 RATIO
Triglycerides: 186 mg/dL — ABNORMAL HIGH (ref <150)
VLDL: 37 mg/dL (ref 0–40)

## 2020-04-02 LAB — IRON AND TIBC
Iron: 67 ug/dL (ref 28–170)
Saturation Ratios: 17 % (ref 10.4–31.8)
TIBC: 399 ug/dL (ref 250–450)
UIBC: 332 ug/dL

## 2020-04-02 MED ORDER — SODIUM CHLORIDE 0.9 % IV SOLN
Freq: Once | INTRAVENOUS | Status: AC
  Filled 2020-04-02: qty 250

## 2020-04-02 MED ORDER — SODIUM CHLORIDE 0.9 % IV SOLN
510.0000 mg | Freq: Once | INTRAVENOUS | Status: AC
  Administered 2020-04-02: 510 mg via INTRAVENOUS
  Filled 2020-04-02: qty 510

## 2020-04-02 NOTE — Progress Notes (Signed)
[facility] CONSULT NOTE  Patient Care Team: Elby Beck, FNP as PCP - General (Nurse Practitioner) Juanito Doom, MD as Consulting Physician (Pulmonary Disease) Forest Gleason, MD (Inactive) as Consulting Physician (Oncology) Gatha Mayer, MD as Consulting Physician (Gastroenterology) Leandrew Koyanagi, MD as Referring Physician (Ophthalmology)  CHIEF COMPLAINTS/PURPOSE OF CONSULTATION: Anemia  # ANEMIA- sec to IDA/CKD [Dr.Lateef-hemoglobin 11.5/iron saturation 13% ferritin 30; intolerant to p.o. iron.]# colonoscopy-~ 2016; [not until 2024]  # CKD stage III  # Hx of smoking quit  Oncology History   No history exists.     HISTORY OF PRESENTING ILLNESS:  Kristie Byrd 74 y.o.  female longstanding history of chronic kidney disease stage III/ongoing anemia is here for a follow up.  On p.o. iron as per nephrology.  No nausea no vomiting.  Chronic mild fatigue.  No blood in stools or black or stools.   Review of Systems  Constitutional: Positive for malaise/fatigue. Negative for chills, diaphoresis, fever and weight loss.  HENT: Negative for nosebleeds and sore throat.   Eyes: Negative for double vision.  Respiratory: Negative for cough, hemoptysis, sputum production, shortness of breath and wheezing.   Cardiovascular: Negative for chest pain, palpitations, orthopnea and leg swelling.  Gastrointestinal: Positive for constipation. Negative for abdominal pain, blood in stool, diarrhea, heartburn, melena, nausea and vomiting.  Genitourinary: Negative for dysuria, frequency and urgency.  Musculoskeletal: Negative for back pain and joint pain.  Skin: Negative.  Negative for itching and rash.  Neurological: Negative for dizziness, tingling, focal weakness, weakness and headaches.  Endo/Heme/Allergies: Does not bruise/bleed easily.  Psychiatric/Behavioral: Negative for depression. The patient is not nervous/anxious and does not have insomnia.       MEDICAL HISTORY:  Past Medical History:  Diagnosis Date  . Arthritis   . Cancer ([place])    cervix, skin  . Colon polyps   . COPD (chronic obstructive pulmonary disease) ([place])   . Depression   . GERD (gastroesophageal reflux disease)   . Headache   . Hypertension   . Iron deficiency anemia 09/27/2012  . PONV (postoperative nausea and vomiting)   . Shingles     SURGICAL HISTORY: Past Surgical History:  Procedure Laterality Date  . BREAST BIOPSY Bilateral   . CARPAL TUNNEL RELEASE Bilateral   . COLONOSCOPY  multiple  . KNEE ARTHROPLASTY Right 05/12/2016   Procedure: COMPUTER ASSISTED TOTAL KNEE ARTHROPLASTY;  Surgeon: Dereck Leep, MD;  Location: [facility] ORS;  Service: Orthopedics;  Laterality: Right;  . KNEE SURGERY Right   . NOSE SURGERY    . TONSILLECTOMY      SOCIAL HISTORY: Social History   Socioeconomic History  . Marital status: Single    Spouse name: Not on file  . Number of children: 2  . Years of education: Not on file  . Highest education level: Not on file  Occupational History  . Occupation: retired  Tobacco Use  . Smoking status: Former Smoker    Packs/day: 1.00    Years: 37.00    Pack years: 37.00    Types: Cigarettes    Quit date: 11/15/1996    Years since quitting: 23.3  . Smokeless tobacco: Never Used  Vaping Use  . Vaping Use: Never used  Substance and Sexual Activity  . Alcohol use: No    Alcohol/week: 0.0 standard drinks  . Drug use: No    Comment: uses CBD oil   . Sexual activity: Never  Other Topics Concern  . Not on file  Social History Narrative   Lives alone.  Divorced. Two daughters- in 45s, 3 grandchildren.   Does not know family history- raised in [facility].        Desires CPR.  Does not have HPOA.   She would possibly want life support if for short period of time . Unsure about feeding tube.   Social Determinants of Health   Financial Resource Strain:   . Difficulty of Paying Living Expenses: Not on file   Food Insecurity:   . Worried About Charity fundraiser in the Last Year: Not on file  . Ran Out of Food in the Last Year: Not on file  Transportation Needs:   . Lack of Transportation (Medical): Not on file  . Lack of Transportation (Non-Medical): Not on file  Physical Activity:   . Days of Exercise per Week: Not on file  . Minutes of Exercise per Session: Not on file  Stress:   . Feeling of Stress : Not on file  Social Connections:   . Frequency of Communication with Friends and Family: Not on file  . Frequency of Social Gatherings with Friends and Family: Not on file  . Attends Religious Services: Not on file  . Active Member of Clubs or Organizations: Not on file  . Attends Archivist Meetings: Not on file  . Marital Status: Not on file  Intimate Partner Violence:   . Fear of Current or Ex-Partner: Not on file  . Emotionally Abused: Not on file  . Physically Abused: Not on file  . Sexually Abused: Not on file    FAMILY HISTORY: Family History  Family history unknown: Yes    ALLERGIES:  is allergic to codeine, demerol [meperidine], fleet phospho-soda [sodium phosphates], latex, other, and prednisone.  MEDICATIONS:  Current Outpatient Medications  Medication Sig Dispense Refill  . acetaminophen (TYLENOL) 650 MG CR tablet Take by mouth.    Marland Kitchen albuterol (VENTOLIN HFA) 108 (90 Base) MCG/ACT inhaler INHALE 2 PUFFS EVERY 6 HOURS FOR WHEEZING OR SHORTNESS OF BREATH AS NEEDED 8.5 g 1  . buPROPion (WELLBUTRIN XL) 300 MG 24 hr tablet TAKE 1 TABLET BY MOUTH ONCE DAILY 90 tablet 0  . calcitRIOL (ROCALTROL) 0.25 MCG capsule Take 0.25 mcg by mouth daily.    . Cholecalciferol (VITAMIN D3) 1000 units CAPS Take 1,000 Units by mouth daily.    . ferric citrate (AURYXIA) 1 GM 210 MG(Fe) tablet Take 2 tablets (420 mg total) by mouth 2 (two) times daily with a meal. 120 tablet 5  . fluticasone (FLONASE) 50 MCG/ACT nasal spray Place 2 sprays into both nostrils daily. 16 g 6  .  lisinopril (ZESTRIL) 5 MG tablet Take 1 tablet (5 mg total) by mouth daily. 90 tablet 3  . Multiple Vitamin (MULTIVITAMIN) tablet Take 1 tablet by mouth at bedtime.     . propranolol (INDERAL) 40 MG tablet Take 1 tablet (40 mg total) by mouth 2 (two) times daily. 180 tablet 3  . sertraline (ZOLOFT) 50 MG tablet Take 1 tablet (50 mg total) by mouth daily. 90 tablet 3  . Turmeric 450 MG CAPS Take 1 capsule by mouth daily.    . cyanocobalamin (,VITAMIN B-12,) 1000 MCG/ML injection inject 1 milliliter intramuscularly every month (Patient not taking: Reported on 04/02/2020) 1 mL 3  . NON FORMULARY HEMP OIL: DAILY     No current facility-administered medications for this visit.      Marland Kitchen  PHYSICAL EXAMINATION: ECOG PERFORMANCE STATUS: 1 -  Symptomatic but completely ambulatory  Vitals:   04/02/20 1313  BP: 116/72  Pulse: 70  Resp: 18  Temp: (!) 97.5 F (36.4 C)  SpO2: 95%   Filed Weights   04/02/20 1313  Weight: 240 lb (108.9 kg)    Physical Exam HENT:     Head: Normocephalic and atraumatic.     Mouth/Throat:     Pharynx: No oropharyngeal exudate.  Eyes:     Pupils: Pupils are equal, round, and reactive to light.  Cardiovascular:     Rate and Rhythm: Normal rate and regular rhythm.  Pulmonary:     Effort: No respiratory distress.     Breath sounds: No wheezing.  Abdominal:     General: Bowel sounds are normal. There is no distension.     Palpations: Abdomen is soft. There is no mass.     Tenderness: There is no abdominal tenderness. There is no guarding or rebound.  Musculoskeletal:        General: No tenderness. Normal range of motion.     Cervical back: Normal range of motion and neck supple.  Skin:    General: Skin is warm.  Neurological:     Mental Status: She is alert and oriented to person, place, and time.  Psychiatric:        Mood and Affect: Affect normal.      LABORATORY DATA:  I have reviewed the data as listed Lab Results  Component Value Date   WBC  5.2 04/02/2020   HGB 11.4 (L) 04/02/2020   HCT 35.5 (L) 04/02/2020   MCV 93.4 04/02/2020   PLT 238 04/02/2020   Recent Labs    04/04/19 1310 10/03/19 1324 04/02/20 1242  NA 137 138 140  K 4.3 4.3 4.2  CL 101 99 99  CO2 29 30 33*  GLUCOSE 130* 106* 113*  BUN 21 22 19   CREATININE 1.10* 1.13* 1.09*  CALCIUM 9.3 9.5 9.7  GFRNONAA 50* 49* 54*  GFRAA 58* 56*  --   PROT 7.1  --   --   ALBUMIN 3.9  --   --   AST 18  --   --   ALT 17  --   --   ALKPHOS 97  --   --   BILITOT 0.4  --   --     RADIOGRAPHIC STUDIES: I have personally reviewed the radiological images as listed and agreed with the findings in the report. No results found.  ASSESSMENT & PLAN:   Anemia of chronic kidney failure, stage 3 (moderate) # Mild Anemia/IDA- sec to CKD stage III-hemoglobin 11.8; Iron sat- 12%;Ferritin-14; Proceed with IV ferrahem; [costs].  Iron tablets: Constipation [Aurxya from neprhology].   # CKD- stage III [Dr.Lateef]-GFR 54  Followed by nephrology.  # DISPOSITION: # Ferrahem infusion  # follow up in 6 months-labs/cbc/bmpiron studies/fertitin-/possible Ferrahem-Dr.B   All questions were answered. The patient knows to call the clinic with any problems, questions or concerns.    Cammie Sickle, MD 04/02/2020 4:58 PM

## 2020-04-02 NOTE — Progress Notes (Signed)
Pt tolerated Feraheme infusion well with no signs of complications. Pt observed for 30 minutes after the infusion per protocol. RN educated pt on the importance of notifying the clinic if any complications occur at home, pt verbalized understanding and all questions answered at this time. VSS. Pt stable for discharge. Pt discharged home.  Kourtlyn Charlet [employer]

## 2020-04-02 NOTE — Assessment & Plan Note (Addendum)
#   Mild Anemia/IDA- sec to CKD stage III-hemoglobin 11.8; Iron sat- 12%;Ferritin-14; Proceed with IV ferrahem; [costs].  Iron tablets: Constipation [Aurxya from neprhology].   # CKD- stage III [Dr.Lateef]-GFR 54  Followed by nephrology.  # DISPOSITION: # Ferrahem infusion  # follow up in 6 months-labs/cbc/bmpiron studies/fertitin-/possible Ferrahem-Dr.B

## 2020-04-30 ENCOUNTER — Encounter: Payer: Self-pay | Admitting: Family Medicine

## 2020-05-07 ENCOUNTER — Telehealth: Payer: Self-pay

## 2020-05-07 ENCOUNTER — Telehealth (INDEPENDENT_AMBULATORY_CARE_PROVIDER_SITE_OTHER): Payer: Self-pay | Admitting: Gastroenterology

## 2020-05-07 DIAGNOSIS — Z86010 Personal history of colonic polyps: Secondary | ICD-10-CM

## 2020-05-07 NOTE — Telephone Encounter (Signed)
Kristie Byrd has been informed that the bowel prep Dr.  Silvano Rusk used was called "Prep-o-Pik" and according to Dr. Silvano Rusk that prep is now called "ClenPiq".  I advised her that ClenPiq is not recommended for her because she has chronic kidney disease.  She is interested in SuTab prep.  She's a little uneasy about having to take so many pills. She realizes that she has to have her colonoscopy but doesn't want to experience the vomiting that she had last time with the prep.  I advised her that I would be able to get a prescription sent to the pharmacy to help with it should it occur.  She said she will discuss the bowel prep options SuTab, and ClenPiq with her nephrologist and call me back once she has her appt with Dr. Holley Raring.  Thanks,  [place], [place]

## 2020-05-07 NOTE — Progress Notes (Signed)
Contacted patient to schedule her colonoscopy. Patient states that she would like to have a 2 pill prep. I asked her if she could recall the name of the 2 pills she took but she could not recall.  We discussed the other colonoscopy preps, and I addressed it with Dr. Vicente Males.  Dr. Vicente Males recommended Golytely Bowel Prep because pts chronic kidney disease, and due to her allergies. Pt states that if she has to have Golytely bowel prep, she would rather not have the colonoscopy because Golytely made her so nauseated the last time. She wanted me to message Dr. Silvano Rusk to see if he recalled what the two pills were-which I've done.  Will await to see what he says.  Colonoscopy not scheduled at this time.  Gastroenterology Pre-Procedure Review  Request Date: Not Scheduled Yet Requesting Physician: Dr. Marius Ditch or Bonna Gains (Prefers [facility] at [facility])  PATIENT REVIEW QUESTIONS: The patient responded to the following health history questions as indicated:    1. Are you having any GI issues? no 2. Do you have a personal history of Polyps? yes (3 sessile polyps noted on 02/06/13 Colonoscopy performed by Dr. Silvano Rusk [facility]) 3. Do you have a family history of Colon Cancer or Polyps? no 4. Diabetes Mellitus? no 5. Joint replacements in the past 12 months?2017 knee replacement 6. Major health problems in the past 3 months?back fracture pt can not lay on her right side 7. Any artificial heart valves, MVP, or defibrillator?no    MEDICATIONS & ALLERGIES:    Patient reports the following regarding taking any anticoagulation/antiplatelet therapy:   Plavix, Coumadin, Eliquis, Xarelto, Lovenox, Pradaxa, Brilinta, or Effient? no Aspirin? no  Patient confirms/reports the following medications:  Current Outpatient Medications  Medication Sig Dispense Refill  . acetaminophen (TYLENOL) 650 MG CR tablet Take by mouth.    Marland Kitchen albuterol (VENTOLIN HFA) 108 (90 Base) MCG/ACT inhaler INHALE 2 PUFFS EVERY 6 HOURS FOR  WHEEZING OR SHORTNESS OF BREATH AS NEEDED 8.5 g 1  . buPROPion (WELLBUTRIN XL) 300 MG 24 hr tablet TAKE 1 TABLET BY MOUTH ONCE DAILY 90 tablet 0  . calcitRIOL (ROCALTROL) 0.25 MCG capsule Take 0.25 mcg by mouth daily.    . Cholecalciferol (VITAMIN D3) 1000 units CAPS Take 1,000 Units by mouth daily.    . ferric citrate (AURYXIA) 1 GM 210 MG(Fe) tablet Take 2 tablets (420 mg total) by mouth 2 (two) times daily with a meal. 120 tablet 5  . fluticasone (FLONASE) 50 MCG/ACT nasal spray Place 2 sprays into both nostrils daily. 16 g 6  . lisinopril (ZESTRIL) 5 MG tablet Take 1 tablet (5 mg total) by mouth daily. 90 tablet 3  . Multiple Vitamin (MULTIVITAMIN) tablet Take 1 tablet by mouth at bedtime.     . NON FORMULARY HEMP OIL: DAILY    . propranolol (INDERAL) 40 MG tablet Take 1 tablet (40 mg total) by mouth 2 (two) times daily. 180 tablet 3  . sertraline (ZOLOFT) 50 MG tablet Take 1 tablet (50 mg total) by mouth daily. 90 tablet 3  . Turmeric 450 MG CAPS Take 1 capsule by mouth daily.    . cyanocobalamin (,VITAMIN B-12,) 1000 MCG/ML injection inject 1 milliliter intramuscularly every month (Patient not taking: No sig reported) 1 mL 3   No current facility-administered medications for this visit.    Patient confirms/reports the following allergies:  Allergies  Allergen Reactions  . Codeine Nausea And Vomiting  . Demerol [Meperidine] Nausea And Vomiting  . Fleet Phospho-Soda [  Sodium Phosphates] Nausea And Vomiting    Vomiting   . Latex Rash    Blisters  . Other Other (See Comments)    Band-Aid, blister  . Prednisone Other (See Comments)    Chest pressure    No orders of the defined types were placed in this encounter.   AUTHORIZATION INFORMATION Primary Insurance: 1D#: Group #:  Secondary Insurance: 1D#: Group #:  SCHEDULE INFORMATION: Date: Not scheduled yet Time: Location:[facility]

## 2020-05-13 ENCOUNTER — Telehealth: Payer: Self-pay | Admitting: Internal Medicine

## 2020-05-13 ENCOUNTER — Other Ambulatory Visit: Payer: Self-pay | Admitting: Family Medicine

## 2020-05-13 DIAGNOSIS — Z1231 Encounter for screening mammogram for malignant neoplasm of breast: Secondary | ICD-10-CM

## 2020-05-13 NOTE — Telephone Encounter (Signed)
Was told by CMA that we can't do a referral for a mammogram until the patient sees the new provider. Called and left message for the patient to call the office no details left. EM

## 2020-05-13 NOTE — Telephone Encounter (Signed)
Patient is a TOC from [name] patient is way overdue for her Mammogram. She is requesting a order for a mammogram be sent prior to her TOC appt. Please advise patient Kristie Byrd

## 2020-05-13 NOTE — Telephone Encounter (Signed)
Wants it to be sent to [facility] Phone # is [phone]

## 2020-05-13 NOTE — Telephone Encounter (Signed)
It is only been a year since her last mammogram, she should not need an order she should just be able to call and schedule this.  I see her transfer of care appointment is in March

## 2020-05-14 ENCOUNTER — Encounter: Payer: Self-pay | Admitting: Family Medicine

## 2020-05-14 ENCOUNTER — Other Ambulatory Visit: Payer: Self-pay

## 2020-05-14 ENCOUNTER — Ambulatory Visit (INDEPENDENT_AMBULATORY_CARE_PROVIDER_SITE_OTHER): Payer: PPO | Admitting: Family Medicine

## 2020-05-14 VITALS — BP 130/80 | HR 76 | Temp 97.2°F | Ht 64.0 in | Wt 238.2 lb

## 2020-05-14 DIAGNOSIS — M549 Dorsalgia, unspecified: Secondary | ICD-10-CM | POA: Diagnosis not present

## 2020-05-14 DIAGNOSIS — G8929 Other chronic pain: Secondary | ICD-10-CM

## 2020-05-14 MED ORDER — TIZANIDINE HCL 2 MG PO TABS
1.0000 mg | ORAL_TABLET | Freq: Four times a day (QID) | ORAL | 0 refills | Status: DC | PRN
Start: 2020-05-14 — End: 2020-07-21

## 2020-05-14 NOTE — Progress Notes (Signed)
Kristie Butson T. Tereso Unangst, MD, CAQ Sports Medicine  Primary Care and Sports Medicine [facility] [address] [place], [address]  Phone: [phone]  FAX: [phone]  Kristie Byrd - 74 y.o. female  MRN [id number]  Date of Birth: Dec 13, 1945  Date: 05/14/2020  PCP: Emi Belfast, FNP  Referral: Emi Belfast, FNP  Chief Complaint  Patient presents with  . Hip Pain    Right  . Back Pain    This visit occurred during the SARS-CoV-2 public health emergency.  Safety protocols were in place, including screening questions prior to the visit, additional usage of staff PPE, and extensive cleaning of exam room while observing appropriate contact time as indicated for disinfecting solutions.   Subjective:   Kristie Byrd is a 74 y.o. very pleasant female patient with Body mass index is 40.9 kg/m. who presents with the following:  She presents with some chronic back pain, and this is been going on for several years, but in reality the patient is here with some acute on chronic back pain where she within the last week to 10 days did flare of her back  She has seen Dr. Yevette Edwards, Claria Dice, had an MRI of the lumbar spine. 11/22020 - she saw Cheri Fowler She did see Oncology. ? 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 - on bone scan, felt to be insufficiency fracture This was felt to be a solid diagnosis after bone scan. Interventional Rads did not want to do an intervention.  This all relates to her prior sacral insufficiency fracture  Was trying to lift some bags, and she is having trouble sitting.  Tried to do some core work - before [employer] was tryig to get some trash together. About a week or so - just in the posterior aspect of upper pelvis  She also continues to have some pain at the lowest level of her back  Review of Systems is noted in the HPI, as appropriate   Objective:   BP 130/80   Pulse 76   Temp (!) 97.2 F (36.2 C)  (Temporal)   Ht 5\' 4"  (1.626 m)   Wt 238 lb 4 oz (108.1 kg)   SpO2 96%   BMI 40.90 kg/m   Range of motion and strength in the entirety of the lower extremities including the hip, knee, foot and ankle are entirely normal and 5/5 strength.  Neurovascularly she is intact throughout and has normal sensation throughout her entirety of her lower extremities.  She has no spinous process tenderness.  She does have L3-S1 bilateral tenderness and some tight musculature in the erector spinae.  She also does have some pain in her upper posterior pelvic region that is tender to palpation.  Her hips do move quite well and she has no provokable pain with rotational movements of the hip.  Radiology: No results found.  Assessment and Plan:     ICD-10-CM   1. Acute back pain, unspecified back location, unspecified back pain laterality  M54.9   2. Chronic back pain greater than 3 months duration  M54.9    G89.29    Total encounter time: 30 minutes. This includes total time spent on the day of encounter.  Additional time spent on extensive chart review, review of bone scan as well as MRI.  Reassurance, this is almost certainly all muscular in character.  Vast majority these will improve over time, but her chronic back pain at baseline could prolong this.  Patient Instructions  Take Tylenol/Acetaminophen ES (500mg ) 2 tabs by mouth three times a day max as needed.  Naproxen (Alleve) 220 mg, once a day - don't have to take it every day, but sometimes it is ok.      Meds ordered this encounter  Medications  . tiZANidine (ZANAFLEX) 2 MG tablet    Sig: Take 0.5-1 tablets (1-2 mg total) by mouth every 6 (six) hours as needed for muscle spasms.    Dispense:  20 tablet    Refill:  0   Medications Discontinued During This Encounter  Medication Reason  . fluticasone (FLONASE) 50 MCG/ACT nasal spray Duplicate   No orders of the defined types were placed in this encounter.   Follow-up: No  follow-ups on file.  Signed,  . Jarelle Ates, MD   Outpatient Encounter Medications as of 05/14/2020  Medication Sig  . acetaminophen (TYLENOL) 650 MG CR tablet Take by mouth.  05/16/2020 albuterol (VENTOLIN HFA) 108 (90 Base) MCG/ACT inhaler INHALE 2 PUFFS EVERY 6 HOURS FOR WHEEZING OR SHORTNESS OF BREATH AS NEEDED  . buPROPion (WELLBUTRIN XL) 300 MG 24 hr tablet TAKE 1 TABLET BY MOUTH ONCE DAILY  . calcitRIOL (ROCALTROL) 0.25 MCG capsule Take 0.25 mcg by mouth daily.  . Cholecalciferol (VITAMIN D3) 1000 units CAPS Take 1,000 Units by mouth daily.  . ferric citrate (AURYXIA) 1 GM 210 MG(Fe) tablet Take 2 tablets (420 mg total) by mouth 2 (two) times daily with a meal.  . fluticasone (FLONASE) 50 MCG/ACT nasal spray Place 2 sprays into both nostrils daily as needed for allergies or rhinitis.  Marland Kitchen lisinopril (ZESTRIL) 5 MG tablet Take 1 tablet (5 mg total) by mouth daily.  . Multiple Vitamin (MULTIVITAMIN) tablet Take 1 tablet by mouth at bedtime.   . NON FORMULARY HEMP OIL: DAILY  . propranolol (INDERAL) 40 MG tablet Take 1 tablet (40 mg total) by mouth 2 (two) times daily.  . sertraline (ZOLOFT) 50 MG tablet Take 1 tablet (50 mg total) by mouth daily.  Marland Kitchen tiZANidine (ZANAFLEX) 2 MG tablet Take 0.5-1 tablets (1-2 mg total) by mouth every 6 (six) hours as needed for muscle spasms.  . Turmeric 450 MG CAPS Take 1 capsule by mouth daily.  . cyanocobalamin (,VITAMIN B-12,) 1000 MCG/ML injection inject 1 milliliter intramuscularly every month (Patient not taking: No sig reported)  . [DISCONTINUED] fluticasone (FLONASE) 50 MCG/ACT nasal spray Place 2 sprays into both nostrils daily.   No facility-administered encounter medications on file as of 05/14/2020.

## 2020-05-14 NOTE — Patient Instructions (Signed)
Take Tylenol/Acetaminophen ES (500mg ) 2 tabs by mouth three times a day max as needed.  Naproxen (Alleve) 220 mg, once a day - don't have to take it every day, but sometimes it is ok.

## 2020-05-15 NOTE — Telephone Encounter (Signed)
Pt is aware as instructed 

## 2020-05-29 DIAGNOSIS — N1831 Chronic kidney disease, stage 3a: Secondary | ICD-10-CM | POA: Diagnosis not present

## 2020-05-29 DIAGNOSIS — N2581 Secondary hyperparathyroidism of renal origin: Secondary | ICD-10-CM | POA: Diagnosis not present

## 2020-05-29 DIAGNOSIS — D631 Anemia in chronic kidney disease: Secondary | ICD-10-CM | POA: Diagnosis not present

## 2020-05-29 DIAGNOSIS — I10 Essential (primary) hypertension: Secondary | ICD-10-CM | POA: Diagnosis not present

## 2020-07-01 ENCOUNTER — Telehealth: Payer: Self-pay | Admitting: *Deleted

## 2020-07-01 NOTE — Telephone Encounter (Signed)
[facility] faxed lab orders for cancer center to draw.  Reviewed chart- next apt with our office is not untli May. We will call [facility] back to determine when they would like these labs collected. If a sooner apt is needed, the patient will need to get these labs at the nephrology office

## 2020-07-01 NOTE — Telephone Encounter (Signed)
-----   Message from Secundino Ginger sent at 07/01/2020  2:35 PM EST ----- Regarding: orders from [facility] from [facility] scanned into chart

## 2020-07-02 NOTE — Telephone Encounter (Signed)
Left vm for Dr. Elwyn Lade cma. Received lab orders from Dr. Holley Raring. Explained that patient is not expected to have labs drawn again in our office until May. If these labs are urgent and need to be drawn asap, then patient will need to have labs drawn at [facility] office.

## 2020-07-04 ENCOUNTER — Encounter: Payer: PPO | Admitting: Internal Medicine

## 2020-07-04 ENCOUNTER — Telehealth: Payer: Self-pay | Admitting: *Deleted

## 2020-07-04 ENCOUNTER — Ambulatory Visit: Payer: PPO

## 2020-07-04 DIAGNOSIS — D631 Anemia in chronic kidney disease: Secondary | ICD-10-CM

## 2020-07-04 DIAGNOSIS — N183 Chronic kidney disease, stage 3 unspecified: Secondary | ICD-10-CM

## 2020-07-04 NOTE — Telephone Encounter (Signed)
Patient arrived to cancer center at 1310 today. Patient stated that she had an apt in our office to get labs drawn and see md. Patient early for apt. apts with Dr. Jacinto Reap are in May. Lab orders previously sent by Dr. Holley Raring. Md agreed to have these labs drawn as well as iron panels. Orders entered for patient to have labs draw in add on. Patient elected to come back at her sch. Apt in may 2022. She declines labs today. Dr. Rogue Bussing made aware

## 2020-07-21 ENCOUNTER — Encounter: Payer: PPO | Admitting: Family Medicine

## 2020-07-21 ENCOUNTER — Ambulatory Visit (INDEPENDENT_AMBULATORY_CARE_PROVIDER_SITE_OTHER)
Admission: RE | Admit: 2020-07-21 | Discharge: 2020-07-21 | Disposition: A | Discharge status: Home / Self Care | Payer: PPO | Source: Ambulatory Visit | Attending: Internal Medicine | Admitting: Internal Medicine

## 2020-07-21 ENCOUNTER — Ambulatory Visit (INDEPENDENT_AMBULATORY_CARE_PROVIDER_SITE_OTHER): Payer: PPO | Admitting: Internal Medicine

## 2020-07-21 ENCOUNTER — Other Ambulatory Visit: Payer: Self-pay

## 2020-07-21 ENCOUNTER — Encounter: Payer: Self-pay | Admitting: Internal Medicine

## 2020-07-21 VITALS — BP 130/70 | HR 81 | Temp 98.1°F | Wt 237.0 lb

## 2020-07-21 DIAGNOSIS — K219 Gastro-esophageal reflux disease without esophagitis: Secondary | ICD-10-CM | POA: Diagnosis not present

## 2020-07-21 DIAGNOSIS — D631 Anemia in chronic kidney disease: Secondary | ICD-10-CM

## 2020-07-21 DIAGNOSIS — M25541 Pain in joints of right hand: Secondary | ICD-10-CM | POA: Diagnosis not present

## 2020-07-21 DIAGNOSIS — N1831 Chronic kidney disease, stage 3a: Secondary | ICD-10-CM | POA: Diagnosis not present

## 2020-07-21 DIAGNOSIS — M7989 Other specified soft tissue disorders: Secondary | ICD-10-CM

## 2020-07-21 DIAGNOSIS — J449 Chronic obstructive pulmonary disease, unspecified: Secondary | ICD-10-CM

## 2020-07-21 DIAGNOSIS — I10 Essential (primary) hypertension: Secondary | ICD-10-CM

## 2020-07-21 DIAGNOSIS — E538 Deficiency of other specified B group vitamins: Secondary | ICD-10-CM

## 2020-07-21 DIAGNOSIS — N183 Chronic kidney disease, stage 3 unspecified: Secondary | ICD-10-CM

## 2020-07-21 DIAGNOSIS — M79641 Pain in right hand: Secondary | ICD-10-CM | POA: Diagnosis not present

## 2020-07-21 DIAGNOSIS — M1711 Unilateral primary osteoarthritis, right knee: Secondary | ICD-10-CM

## 2020-07-21 DIAGNOSIS — F32A Depression, unspecified: Secondary | ICD-10-CM

## 2020-07-21 DIAGNOSIS — G250 Essential tremor: Secondary | ICD-10-CM

## 2020-07-21 DIAGNOSIS — F419 Anxiety disorder, unspecified: Secondary | ICD-10-CM | POA: Diagnosis not present

## 2020-07-21 LAB — URIC ACID: Uric Acid, Serum: 5.9 mg/dL (ref 2.4–7.0)

## 2020-07-21 LAB — VITAMIN B12: Vitamin B-12: 548 pg/mL (ref 211–911)

## 2020-07-21 NOTE — Progress Notes (Signed)
HPI  Pt presents to the clinic today to establish care and for management of the conditions listed below.   HTN: Her BP today is 130/70. She is taking Lisinopril as prescribed. ECG from 04/2016 reviewed.  COPD: Former smoker. She reports chronic shortness of breath. She does not use inhalers at this time. PFT's from 11/2013 reviewed. She does not follow with pulmoology.  GERD: This is not currently an issue. She is not taking any medications for this at this time. There is no upper GI on file.  OA: Mainly in her right knee, s/p TKR. She takes Tumeric and Tylenol as needed with some relief of symptoms.  Anxiety and Depression: Managed on Sertraline and Wellbutrin. She does not see a therapist. She denies SI/HI.  Anemia: She takes iron daily and gets iron infusions intermittently, supposed to be on B12 shots but has not had them in 5-6 months. She would like her B12 shots restarted. She follows with hematology.  CKD 3: GFR 50. She is on Lisinopril for renal protection. She follows with Dr. Holley Raring.  Essential Tremor: She takes Propranolol as prescribed. She does not follow with the neurology.  Flu: 03/2020 Tetanus: 2016 Pneumovax: 2018 Prevnar: 2015 Zostovax: never Shingrix: 2018 Covid: Pfizer x 2 Mammogram: 03/2019 Bone Density: > 5 years ago Colon Screening: 2014  Past Medical History:  Diagnosis Date  . Arthritis   . Cancer ([place])    cervix, skin  . Colon polyps   . COPD (chronic obstructive pulmonary disease) ([place])   . Depression   . GERD (gastroesophageal reflux disease)   . Headache   . Hypertension   . Iron deficiency anemia 09/27/2012  . PONV (postoperative nausea and vomiting)   . Shingles     Current Outpatient Medications  Medication Sig Dispense Refill  . acetaminophen (TYLENOL) 650 MG CR tablet Take by mouth.    Marland Kitchen albuterol (VENTOLIN HFA) 108 (90 Base) MCG/ACT inhaler INHALE 2 PUFFS EVERY 6 HOURS FOR WHEEZING OR SHORTNESS OF BREATH AS NEEDED 8.5 g 1  .  buPROPion (WELLBUTRIN XL) 300 MG 24 hr tablet TAKE 1 TABLET BY MOUTH ONCE DAILY 90 tablet 0  . calcitRIOL (ROCALTROL) 0.25 MCG capsule Take 0.25 mcg by mouth daily.    . Cholecalciferol (VITAMIN D3) 1000 units CAPS Take 1,000 Units by mouth daily.    . cyanocobalamin (,VITAMIN B-12,) 1000 MCG/ML injection inject 1 milliliter intramuscularly every month (Patient not taking: No sig reported) 1 mL 3  . ferric citrate (AURYXIA) 1 GM 210 MG(Fe) tablet Take 2 tablets (420 mg total) by mouth 2 (two) times daily with a meal. 120 tablet 5  . fluticasone (FLONASE) 50 MCG/ACT nasal spray Place 2 sprays into both nostrils daily as needed for allergies or rhinitis.    Marland Kitchen lisinopril (ZESTRIL) 5 MG tablet Take 1 tablet (5 mg total) by mouth daily. 90 tablet 3  . Multiple Vitamin (MULTIVITAMIN) tablet Take 1 tablet by mouth at bedtime.     . NON FORMULARY HEMP OIL: DAILY    . propranolol (INDERAL) 40 MG tablet Take 1 tablet (40 mg total) by mouth 2 (two) times daily. 180 tablet 3  . sertraline (ZOLOFT) 50 MG tablet Take 1 tablet (50 mg total) by mouth daily. 90 tablet 3  . tiZANidine (ZANAFLEX) 2 MG tablet Take 0.5-1 tablets (1-2 mg total) by mouth every 6 (six) hours as needed for muscle spasms. 20 tablet 0  . Turmeric 450 MG CAPS Take 1 capsule by mouth daily.  No current facility-administered medications for this visit.    Allergies  Allergen Reactions  . Codeine Nausea And Vomiting  . Demerol [Meperidine] Nausea And Vomiting  . Fleet Phospho-Soda [Sodium Phosphates] Nausea And Vomiting    Vomiting   . Latex Rash    Blisters  . Other Other (See Comments)    Band-Aid, blister  . Prednisone Other (See Comments)    Chest pressure    Family History  Family history unknown: Yes    Social History   Socioeconomic History  . Marital status: Single    Spouse name: Not on file  . Number of children: 2  . Years of education: Not on file  . Highest education level: Not on file  Occupational  History  . Occupation: retired  Tobacco Use  . Smoking status: Former Smoker    Packs/day: 1.00    Years: 37.00    Pack years: 37.00    Types: Cigarettes    Quit date: 11/15/1996    Years since quitting: 23.6  . Smokeless tobacco: Never Used  Vaping Use  . Vaping Use: Never used  Substance and Sexual Activity  . Alcohol use: No    Alcohol/week: 0.0 standard drinks  . Drug use: No    Comment: uses CBD oil   . Sexual activity: Never  Other Topics Concern  . Not on file  Social History Narrative   Lives alone.  Divorced. Two daughters- in 54s, 3 grandchildren.   Does not know family history- raised in [facility].        Desires CPR.  Does not have HPOA.   She would possibly want life support if for short period of time . Unsure about feeding tube.   Social Determinants of Health   Financial Resource Strain: Not on file  Food Insecurity: Not on file  Transportation Needs: Not on file  Physical Activity: Not on file  Stress: Not on file  Social Connections: Not on file  Intimate Partner Violence: Not on file    ROS:  Constitutional: Denies fever, malaise, fatigue, headache or abrupt weight changes.  HEENT: Denies eye pain, eye redness, ear pain, ringing in the ears, wax buildup, runny nose, nasal congestion, bloody nose, or sore throat. Respiratory: Pt reports intermittent shortness of breath. Denies difficulty breathing, cough or sputum production.   Cardiovascular: Denies chest pain, chest tightness, palpitations or swelling in the hands or feet.  Gastrointestinal: Denies abdominal pain, bloating, constipation, diarrhea or blood in the stool.  GU: Denies frequency, urgency, pain with urination, blood in urine, odor or discharge. Musculoskeletal: Pt reports pain and swelling of right hand. Denies decrease in range of motion, difficulty with gait, muscle pain.  Skin: Denies redness, rashes, lesions or ulcercations.  Neurological: Pt's daughter concerned about her  memory. Denies dizziness, or problems with balance and coordination.  Psych: Pt has a history of anxiety and depression. Denies  SI/HI.  No other specific complaints in a complete review of systems (except as listed in HPI above).  PE:  BP 130/70   Pulse 81   Temp 98.1 F (36.7 C) (Temporal)   Wt 237 lb (107.5 kg)   SpO2 97%   BMI 40.68 kg/m   Wt Readings from Last 3 Encounters:  05/14/20 238 lb 4 oz (108.1 kg)  04/02/20 240 lb (108.9 kg)  03/31/20 239 lb (108.4 kg)    General: Appears her stated age, obese, chronically ill appearing,  in NAD. HEENT: Head: normal shape and size;  Eyes: sclera white, no icterus, conjunctiva pink, PERRLA and EOMs intact; Cardiovascular: Normal rate. Pulmonary/Chest: Some DOE. Positive vesicular breath sounds.  Musculoskeletal: Gait slow and steady with use of rolling walker. Neurological: Alert and oriented.  Psychiatric: She seems slightly irritated today. Judgement and thought content seem normal.  BMET    Component Value Date/Time   NA 140 04/02/2020 1242   NA 137 02/25/2014 1404   K 4.2 04/02/2020 1242   K 4.2 02/25/2014 1404   CL 99 04/02/2020 1242   CL 102 02/25/2014 1404   CO2 33 (H) 04/02/2020 1242   CO2 28 02/25/2014 1404   GLUCOSE 113 (H) 04/02/2020 1242   GLUCOSE 124 (H) 02/25/2014 1404   BUN 19 04/02/2020 1242   BUN 20 02/10/2017 0000   BUN 24 (H) 02/25/2014 1404   CREATININE 1.09 (H) 04/02/2020 1242   CREATININE 1.28 02/25/2014 1404   CALCIUM 9.7 04/02/2020 1242   CALCIUM 9.0 02/25/2014 1404   GFRNONAA 54 (L) 04/02/2020 1242   GFRNONAA 44 (L) 02/25/2014 1404   GFRNONAA 45 (L) 10/26/2013 1330   GFRAA 56 (L) 10/03/2019 1324   GFRAA 54 (L) 02/25/2014 1404   GFRAA 52 (L) 10/26/2013 1330    Lipid Panel     Component Value Date/Time   CHOL 191 04/02/2020 1242   TRIG 186 (H) 04/02/2020 1242   HDL 58 04/02/2020 1242   CHOLHDL 3.3 04/02/2020 1242   VLDL 37 04/02/2020 1242   LDLCALC 96 04/02/2020 1242    CBC     Component Value Date/Time   WBC 5.2 04/02/2020 1242   RBC 3.80 (L) 04/02/2020 1242   HGB 11.4 (L) 04/02/2020 1242   HGB 11.4 (L) 02/25/2014 1404   HCT 35.5 (L) 04/02/2020 1242   HCT 34.9 (L) 02/25/2014 1404   PLT 238 04/02/2020 1242   PLT 256 02/25/2014 1404   MCV 93.4 04/02/2020 1242   MCV 89 02/25/2014 1404   MCH 30.0 04/02/2020 1242   MCHC 32.1 04/02/2020 1242   RDW 13.5 04/02/2020 1242   RDW 14.7 (H) 02/25/2014 1404   LYMPHSABS 1.1 04/02/2020 1242   LYMPHSABS 1.1 02/25/2014 1404   MONOABS 0.6 04/02/2020 1242   MONOABS 0.5 02/25/2014 1404   EOSABS 0.0 04/02/2020 1242   EOSABS 0.0 02/25/2014 1404   BASOSABS 0.0 04/02/2020 1242   BASOSABS 0.1 02/25/2014 1404    Hgb A1C Lab Results  Component Value Date   HGBA1C 5.5 08/19/2017     Assessment and Plan:  Pain and Swelling, Right Hand:  Xray right hand today Will check uric acid level today  B12 Deficiency:  B12 level today  Will follow up after labs, make an appt for your Medicare Wellness Exam Webb Silversmith, NP This visit occurred during the SARS-CoV-2 public health emergency.  Safety protocols were in place, including screening questions prior to the visit, additional usage of staff PPE, and extensive cleaning of exam room while observing appropriate contact time as indicated for disinfecting solutions.

## 2020-07-24 ENCOUNTER — Encounter: Payer: Self-pay | Admitting: Internal Medicine

## 2020-07-24 DIAGNOSIS — G250 Essential tremor: Secondary | ICD-10-CM | POA: Insufficient documentation

## 2020-07-24 NOTE — Assessment & Plan Note (Signed)
Not medicated Will monitor 

## 2020-07-24 NOTE — Assessment & Plan Note (Signed)
Controlled on Lisinopril Reinforced DASH diet Will monitor

## 2020-07-24 NOTE — Assessment & Plan Note (Signed)
Continue Tumeric and Tylenol

## 2020-07-24 NOTE — Assessment & Plan Note (Signed)
Continue Propranolol Will monitor

## 2020-07-24 NOTE — Assessment & Plan Note (Signed)
Continue Lisinopril for renal protection Will monitor

## 2020-07-24 NOTE — Assessment & Plan Note (Signed)
Currently not an issue Will monitor 

## 2020-07-24 NOTE — Patient Instructions (Signed)

## 2020-07-24 NOTE — Assessment & Plan Note (Signed)
Continue oral iron and iron infusions She will continue to follow with hematology

## 2020-07-24 NOTE — Assessment & Plan Note (Signed)
Continue Sertraline and Wellbutrin Support offered today

## 2020-08-13 ENCOUNTER — Other Ambulatory Visit: Payer: Self-pay

## 2020-08-13 ENCOUNTER — Telehealth: Payer: Self-pay

## 2020-08-13 DIAGNOSIS — F419 Anxiety disorder, unspecified: Secondary | ICD-10-CM

## 2020-08-13 DIAGNOSIS — R251 Tremor, unspecified: Secondary | ICD-10-CM

## 2020-08-13 DIAGNOSIS — I10 Essential (primary) hypertension: Secondary | ICD-10-CM

## 2020-08-13 NOTE — Telephone Encounter (Signed)
Patient is calling in about these prescriptions, she wanted to inform us that she is completely out.

## 2020-08-13 NOTE — Telephone Encounter (Signed)
Patient is calling in stating that she has been trying to get scheduled for a mammogram with the breast center in [place] but they are not able to schedule without orders.

## 2020-08-13 NOTE — Telephone Encounter (Signed)
She needs to schedule an appt for her physical. This can be ordered at that time

## 2020-08-13 NOTE — Telephone Encounter (Signed)
Pharmacy requests refill on: Lisinopril 5 mg & Sertraline 50 mg   LAST REFILL: 08/13/2019 (Q-90, R-3) LAST OV: 07/21/2020 NEXT OV: Not Scheduled  PHARMACY: [facility] requests refill on: Bupropion 300 mg   LAST REFILL: 03/07/2020 (Q-90, R-0) LAST OV: 07/21/2020 NEXT OV: Not Scheduled  PHARMACY: [facility]

## 2020-08-14 ENCOUNTER — Telehealth: Payer: Self-pay | Admitting: Internal Medicine

## 2020-08-14 MED ORDER — SERTRALINE HCL 50 MG PO TABS: 50.0000 mg | ORAL_TABLET | Freq: Every day | ORAL | 0 refills | Status: DC

## 2020-08-14 MED ORDER — PROPRANOLOL HCL 40 MG PO TABS
40.0000 mg | ORAL_TABLET | Freq: Two times a day (BID) | ORAL | 0 refills | Status: DC
Start: 2020-08-14 — End: 2020-10-29

## 2020-08-14 MED ORDER — BUPROPION HCL ER (XL) 300 MG PO TB24: 300.0000 mg | ORAL_TABLET | Freq: Every day | ORAL | 0 refills | Status: DC

## 2020-08-14 MED ORDER — LISINOPRIL 5 MG PO TABS: 5.0000 mg | ORAL_TABLET | Freq: Every day | ORAL | 0 refills | Status: DC

## 2020-08-14 NOTE — Telephone Encounter (Signed)
Patient called in stating that her pharmacy has stated that they have not received her medication refills.  Please resubmit to [facility]. EM She has called in twice today.  Please advise patient when they are sent and let the pharmacy know that they have been sent as they are telling the patient that they have not been received.  She needs a refill "on all her meds"

## 2020-08-14 NOTE — Telephone Encounter (Signed)
Pharmacy called, again. Pt is out of medication. Needs refills! Said they have faxed several times and called.

## 2020-09-16 ENCOUNTER — Encounter: Payer: Self-pay | Admitting: Internal Medicine

## 2020-09-16 ENCOUNTER — Ambulatory Visit (INDEPENDENT_AMBULATORY_CARE_PROVIDER_SITE_OTHER): Payer: PPO | Admitting: Internal Medicine

## 2020-09-16 ENCOUNTER — Other Ambulatory Visit: Payer: Self-pay

## 2020-09-16 VITALS — BP 146/80 | HR 71 | Temp 97.2°F | Resp 18 | Ht 64.0 in | Wt 238.0 lb

## 2020-09-16 DIAGNOSIS — Z6841 Body Mass Index (BMI) 40.0 and over, adult: Secondary | ICD-10-CM | POA: Diagnosis not present

## 2020-09-16 DIAGNOSIS — F419 Anxiety disorder, unspecified: Secondary | ICD-10-CM | POA: Diagnosis not present

## 2020-09-16 DIAGNOSIS — Z0000 Encounter for general adult medical examination without abnormal findings: Secondary | ICD-10-CM

## 2020-09-16 DIAGNOSIS — Z136 Encounter for screening for cardiovascular disorders: Secondary | ICD-10-CM

## 2020-09-16 DIAGNOSIS — Z780 Asymptomatic menopausal state: Secondary | ICD-10-CM | POA: Diagnosis not present

## 2020-09-16 DIAGNOSIS — Z1231 Encounter for screening mammogram for malignant neoplasm of breast: Secondary | ICD-10-CM | POA: Diagnosis not present

## 2020-09-16 DIAGNOSIS — F32A Depression, unspecified: Secondary | ICD-10-CM

## 2020-09-16 MED ORDER — SERTRALINE HCL 100 MG PO TABS: 100.0000 mg | ORAL_TABLET | Freq: Every day | ORAL | 1 refills | Status: DC

## 2020-09-16 NOTE — Patient Instructions (Signed)

## 2020-09-16 NOTE — Progress Notes (Signed)
HPI:  Patient presents the clinic today for subsequent annual Medicare wellness exam.  She does not feel like her Sertraline and Wellbutrin are effective. She wants to sleep all the time. She feels down, anxious, irritable and wants to sleep all the time. She denies SI but does have feelings of worthlessness. She is not currently seeing a therapist.   Her daughter also reports difficulty remembering where she put things, reports she left the oven on one time after cooking a pizza. She reports that she got distracted by the TV and she does not typically leave the oven on, the burners on or water running.  She is unsure of her family history related to dementia.   Past Medical History:  Diagnosis Date  . Arthritis   . Cancer ([place])    cervix, skin  . Colon polyps   . COPD (chronic obstructive pulmonary disease) ([place])   . Depression   . GERD (gastroesophageal reflux disease)   . Headache   . Hypertension   . Iron deficiency anemia 09/27/2012  . PONV (postoperative nausea and vomiting)   . Shingles     Current Outpatient Medications  Medication Sig Dispense Refill  . acetaminophen (TYLENOL) 650 MG CR tablet Take by mouth.    Marland Kitchen albuterol (VENTOLIN HFA) 108 (90 Base) MCG/ACT inhaler INHALE 2 PUFFS EVERY 6 HOURS FOR WHEEZING OR SHORTNESS OF BREATH AS NEEDED (Patient not taking: Reported on 07/21/2020) 8.5 g 1  . buPROPion (WELLBUTRIN XL) 300 MG 24 hr tablet Take 1 tablet (300 mg total) by mouth daily. 90 tablet 0  . calcitRIOL (ROCALTROL) 0.25 MCG capsule Take 0.25 mcg by mouth daily.    . Cholecalciferol (VITAMIN D3) 1000 units CAPS Take 1,000 Units by mouth daily.    . cyanocobalamin (,VITAMIN B-12,) 1000 MCG/ML injection inject 1 milliliter intramuscularly every month 1 mL 3  . ferric citrate (AURYXIA) 1 GM 210 MG(Fe) tablet Take 2 tablets (420 mg total) by mouth 2 (two) times daily with a meal. 120 tablet 5  . fluticasone (FLONASE) 50 MCG/ACT nasal spray Place 2 sprays into both nostrils  daily as needed for allergies or rhinitis.    Marland Kitchen lisinopril (ZESTRIL) 5 MG tablet Take 1 tablet (5 mg total) by mouth daily. 90 tablet 0  . Multiple Vitamin (MULTIVITAMIN) tablet Take 1 tablet by mouth at bedtime.     . NON FORMULARY HEMP OIL: DAILY    . propranolol (INDERAL) 40 MG tablet Take 1 tablet (40 mg total) by mouth 2 (two) times daily. 180 tablet 0  . sertraline (ZOLOFT) 50 MG tablet Take 1 tablet (50 mg total) by mouth daily. 90 tablet 0  . Turmeric 450 MG CAPS Take 1 capsule by mouth daily.     No current facility-administered medications for this visit.    Allergies  Allergen Reactions  . Codeine Nausea And Vomiting  . Demerol [Meperidine] Nausea And Vomiting  . Fleet Phospho-Soda [Sodium Phosphates] Nausea And Vomiting    Vomiting   . Latex Rash    Blisters  . Other Other (See Comments)    Band-Aid, blister  . Prednisone Other (See Comments)    Chest pressure    Family History  Family history unknown: Yes    Social History   Socioeconomic History  . Marital status: Single    Spouse name: Not on file  . Number of children: 2  . Years of education: Not on file  . Highest education level: Not on file  Occupational History  .  Occupation: retired  Tobacco Use  . Smoking status: Former Smoker    Packs/day: 1.00    Years: 37.00    Pack years: 37.00    Types: Cigarettes    Quit date: 11/15/1996    Years since quitting: 23.8  . Smokeless tobacco: Never Used  Vaping Use  . Vaping Use: Never used  Substance and Sexual Activity  . Alcohol use: No    Alcohol/week: 0.0 standard drinks  . Drug use: No    Comment: uses CBD oil   . Sexual activity: Never  Other Topics Concern  . Not on file  Social History Narrative   Lives alone.  Divorced. Two daughters- in 38s, 3 grandchildren.   Does not know family history- raised in [facility].        Desires CPR.  Does not have HPOA.   She would possibly want life support if for short period of time .  Unsure about feeding tube.   Social Determinants of Health   Financial Resource Strain: Not on file  Food Insecurity: Not on file  Transportation Needs: Not on file  Physical Activity: Not on file  Stress: Not on file  Social Connections: Not on file  Intimate Partner Violence: Not on file    Hospitiliaztions: None  Health Maintenance:    Flu: 03/2020  Tetanus: 02/2015  Pneumovax: 01/2017   Prevnar: 01/2014  Zostavax: 12/2012  Shingrix: 01/2017, not getting the second dose  COVID: Pfizer x2, not getting the 3rd Covid  Mammogram: 03/2019  Pap Smear: No longer screening  Bone Density: > 5 years ago  Colon Screening: 01/2013, 7 years  Eye Doctor: as needed  Dental Exam: denture   Providers:   PCP: Nicki Reaper, NP   I have personally reviewed and have noted:  1. The patient's medical and social history 2. Their use of alcohol, tobacco or illicit drugs 3. Their current medications and supplements 4. The patient's functional ability including ADL's, fall risks, home safety risks and hearing or visual impairment. 5. Diet and physical activities 6. Evidence for depression or mood disorder  Subjective:   Review of Systems:   Constitutional: Denies fever, malaise, fatigue, headache or abrupt weight changes.  HEENT: Denies eye pain, eye redness, ear pain, ringing in the ears, wax buildup, runny nose, nasal congestion, bloody nose, or sore throat. Respiratory: Patient reports dyspnea on exertion, which she feels like is related to her weight.  Denies difficulty breathing, cough or sputum production.   Cardiovascular: Denies chest pain, chest tightness, palpitations or swelling in the hands or feet.  Gastrointestinal: Patient reports constipation.  Denies abdominal pain, bloating, diarrhea or blood in the stool.  GU: Denies urgency, frequency, pain with urination, burning sensation, blood in urine, odor or discharge. Musculoskeletal: Patient reports back pain, difficulty with  gait.  Denies  muscle pain or joint swelling.  Skin: Denies redness, rashes, lesions or ulcercations.  Neurological: Patient has a history of tremor.  Denies dizziness, difficulty with memory, difficulty with speech or problems with balance and coordination.  Psych: Patient has a history of anxiety and depression.  Denies SI/HI.  No other specific complaints in a complete review of systems (except as listed in HPI above).  Objective:  PE:   BP (!) 146/80 (BP Location: Right Arm, Patient Position: Sitting, Cuff Size: Normal)   Pulse 71   Temp (!) 97.2 F (36.2 C) (Temporal)   Resp 18   Ht 5\' 4"  (1.626 m)   Wt 238  lb 0.2 oz (108 kg)   SpO2 96%   BMI 40.85 kg/m   Wt Readings from Last 3 Encounters:  07/21/20 237 lb (107.5 kg)  05/14/20 238 lb 4 oz (108.1 kg)  04/02/20 240 lb (108.9 kg)    General: Appears her stated age, obese, in NAD. Skin: Warm, dry and intact. No ulcerations noted. HEENT: Head: normal shape and size; Eyes: sclera white and EOMs intact;  Neck: Neck supple, trachea midline. No masses, lumps or thyromegaly present.  Cardiovascular: Normal rate and rhythm. S1,S2 noted.  No murmur, rubs or gallops noted. No JVD or BLE edema. No carotid bruits noted. Pulmonary/Chest: Normal effort and positive vesicular breath sounds. No respiratory distress. No wheezes, rales or ronchi noted. DOE. Abdomen: Soft and nontender. Normal bowel sounds. No distention or masses noted. Liver, spleen and kidneys non palpable. Musculoskeletal:  Strength 5/5 BUE/BLE.  Gait slow and steady with use of cane, uses rolling walker at home. Neurological: Alert and oriented. Cranial nerves II-XII grossly intact. Coordination normal.  Psychiatric: Mood and affect mildly flat. Behavior is normal. Judgment and thought content normal.    BMET    Component Value Date/Time   NA 140 04/02/2020 1242   NA 137 02/25/2014 1404   K 4.2 04/02/2020 1242   K 4.2 02/25/2014 1404   CL 99 04/02/2020 1242   CL  102 02/25/2014 1404   CO2 33 (H) 04/02/2020 1242   CO2 28 02/25/2014 1404   GLUCOSE 113 (H) 04/02/2020 1242   GLUCOSE 124 (H) 02/25/2014 1404   BUN 19 04/02/2020 1242   BUN 20 02/10/2017 0000   BUN 24 (H) 02/25/2014 1404   CREATININE 1.09 (H) 04/02/2020 1242   CREATININE 1.28 02/25/2014 1404   CALCIUM 9.7 04/02/2020 1242   CALCIUM 9.0 02/25/2014 1404   GFRNONAA 54 (L) 04/02/2020 1242   GFRNONAA 44 (L) 02/25/2014 1404   GFRNONAA 45 (L) 10/26/2013 1330   GFRAA 56 (L) 10/03/2019 1324   GFRAA 54 (L) 02/25/2014 1404   GFRAA 52 (L) 10/26/2013 1330    Lipid Panel     Component Value Date/Time   CHOL 191 04/02/2020 1242   TRIG 186 (H) 04/02/2020 1242   HDL 58 04/02/2020 1242   CHOLHDL 3.3 04/02/2020 1242   VLDL 37 04/02/2020 1242   LDLCALC 96 04/02/2020 1242    CBC    Component Value Date/Time   WBC 5.2 04/02/2020 1242   RBC 3.80 (L) 04/02/2020 1242   HGB 11.4 (L) 04/02/2020 1242   HGB 11.4 (L) 02/25/2014 1404   HCT 35.5 (L) 04/02/2020 1242   HCT 34.9 (L) 02/25/2014 1404   PLT 238 04/02/2020 1242   PLT 256 02/25/2014 1404   MCV 93.4 04/02/2020 1242   MCV 89 02/25/2014 1404   MCH 30.0 04/02/2020 1242   MCHC 32.1 04/02/2020 1242   RDW 13.5 04/02/2020 1242   RDW 14.7 (H) 02/25/2014 1404   LYMPHSABS 1.1 04/02/2020 1242   LYMPHSABS 1.1 02/25/2014 1404   MONOABS 0.6 04/02/2020 1242   MONOABS 0.5 02/25/2014 1404   EOSABS 0.0 04/02/2020 1242   EOSABS 0.0 02/25/2014 1404   BASOSABS 0.0 04/02/2020 1242   BASOSABS 0.1 02/25/2014 1404    Hgb A1C Lab Results  Component Value Date   HGBA1C 5.5 08/19/2017      Assessment and Plan:   Medicare Annual Wellness Visit:  Diet: She does not eat meat. She does not consume fruits or veggies. She does eat fried foods. She drinks mostly sweet  tea or decaf coffee Physical activity: None Depression/mood screen: Chronic, PHQ 9 score of 21 Hearing: Intact to whispered voice Visual acuity: Grossly normal ADLs: Capable Fall risk:  High, multiple falls in the last year. Home safety: Good Cognitive evaluation: Intact to orientation, naming, recall and repetition, MMSE score of 29 EOL planning: No adv directives, full code/ I agree  Preventative Medicine: Encouraged her to get flu shot fall.  Tetanus, Pneumovax, Prevnar, Zostavax UTD.  She declines her second Shingrix or Covid booster.  She no longer wants Pap smears.  Mammogram due 03/2021.  Bone density ordered.  Colon screening due but she declines getting this done.  Encouraged her to consume a balanced diet and exercise regimen.  Advised her to see an eye doctor annually and dentist as needed.  Will check lipid profile.  Due dates for screening exam given to patient as part of her AVS.   Next appointment: 1 year, Medicare wellness exam.   Webb Silversmith, NP This visit occurred during the SARS-CoV-2 public health emergency.  Safety protocols were in place, including screening questions prior to the visit, additional usage of staff PPE, and extensive cleaning of exam room while observing appropriate contact time as indicated for disinfecting solutions.

## 2020-09-16 NOTE — Assessment & Plan Note (Signed)
Deteriorated Will increase Sertraline to 100 mg daily, Rx sent to [facility] offered She does contract for safety at this time

## 2020-09-26 ENCOUNTER — Encounter: Payer: Self-pay | Admitting: Internal Medicine

## 2020-09-29 ENCOUNTER — Telehealth: Payer: Self-pay

## 2020-09-29 ENCOUNTER — Other Ambulatory Visit: Payer: Self-pay

## 2020-09-29 ENCOUNTER — Other Ambulatory Visit: Payer: Self-pay | Admitting: *Deleted

## 2020-09-29 DIAGNOSIS — M25541 Pain in joints of right hand: Secondary | ICD-10-CM

## 2020-09-29 DIAGNOSIS — D649 Anemia, unspecified: Secondary | ICD-10-CM

## 2020-09-29 DIAGNOSIS — E538 Deficiency of other specified B group vitamins: Secondary | ICD-10-CM

## 2020-09-29 DIAGNOSIS — Z136 Encounter for screening for cardiovascular disorders: Secondary | ICD-10-CM

## 2020-09-29 DIAGNOSIS — D631 Anemia in chronic kidney disease: Secondary | ICD-10-CM

## 2020-09-29 NOTE — Telephone Encounter (Signed)
Lab order needs to be faxed [phone] attn: [facility]umbles at cancer center. They are not able to see order in system. Patient appt on 18th.

## 2020-09-29 NOTE — Telephone Encounter (Signed)
Lab order has been faxed to the number below.   Thanks,   -Mickel Baas

## 2020-09-30 ENCOUNTER — Other Ambulatory Visit: Payer: Self-pay

## 2020-09-30 ENCOUNTER — Telehealth: Payer: Self-pay | Admitting: *Deleted

## 2020-09-30 ENCOUNTER — Ambulatory Visit
Admission: RE | Admit: 2020-09-30 | Discharge: 2020-09-30 | Disposition: A | Discharge status: Home / Self Care | Payer: PPO | Source: Ambulatory Visit | Attending: Internal Medicine | Admitting: Internal Medicine

## 2020-09-30 DIAGNOSIS — Z13820 Encounter for screening for osteoporosis: Secondary | ICD-10-CM | POA: Insufficient documentation

## 2020-09-30 DIAGNOSIS — M810 Age-related osteoporosis without current pathological fracture: Secondary | ICD-10-CM | POA: Insufficient documentation

## 2020-09-30 DIAGNOSIS — Z1231 Encounter for screening mammogram for malignant neoplasm of breast: Secondary | ICD-10-CM | POA: Insufficient documentation

## 2020-09-30 DIAGNOSIS — Z780 Asymptomatic menopausal state: Secondary | ICD-10-CM

## 2020-09-30 DIAGNOSIS — Z85828 Personal history of other malignant neoplasm of skin: Secondary | ICD-10-CM | POA: Diagnosis not present

## 2020-09-30 NOTE — Telephone Encounter (Signed)
Contacted patient. Explained that a new order for a lipid panel was received by her pcp. This can be added tomorrow for her labs at the cancer center. However, the lipid panel would need to be fasting. Pt's apt in cancer center is not until 1pm. Pt elected to hold off on lipid panel at this time and will have this drawn at pcp's office per patient.

## 2020-10-01 ENCOUNTER — Inpatient Hospital Stay (HOSPITAL_BASED_OUTPATIENT_CLINIC_OR_DEPARTMENT_OTHER): Payer: PPO | Admitting: Oncology

## 2020-10-01 ENCOUNTER — Inpatient Hospital Stay: Payer: PPO | Attending: Oncology

## 2020-10-01 ENCOUNTER — Inpatient Hospital Stay: Payer: PPO

## 2020-10-01 VITALS — BP 117/74 | HR 62 | Temp 96.9°F | Resp 19 | Wt 238.0 lb

## 2020-10-01 VITALS — BP 115/74 | HR 60 | Temp 97.7°F

## 2020-10-01 DIAGNOSIS — D631 Anemia in chronic kidney disease: Secondary | ICD-10-CM | POA: Insufficient documentation

## 2020-10-01 DIAGNOSIS — D649 Anemia, unspecified: Secondary | ICD-10-CM | POA: Diagnosis not present

## 2020-10-01 DIAGNOSIS — N183 Chronic kidney disease, stage 3 unspecified: Secondary | ICD-10-CM

## 2020-10-01 DIAGNOSIS — Z79899 Other long term (current) drug therapy: Secondary | ICD-10-CM | POA: Insufficient documentation

## 2020-10-01 DIAGNOSIS — E538 Deficiency of other specified B group vitamins: Secondary | ICD-10-CM

## 2020-10-01 DIAGNOSIS — M25541 Pain in joints of right hand: Secondary | ICD-10-CM

## 2020-10-01 LAB — CBC WITH DIFFERENTIAL/PLATELET
Abs Immature Granulocytes: 0.01 10*3/uL (ref 0.00–0.07)
Basophils Absolute: 0 10*3/uL (ref 0.0–0.1)
Basophils Relative: 1 %
Eosinophils Absolute: 0 10*3/uL (ref 0.0–0.5)
Eosinophils Relative: 1 %
HCT: 36.6 % (ref 36.0–46.0)
Hemoglobin: 11.7 g/dL — ABNORMAL LOW (ref 12.0–15.0)
Immature Granulocytes: 0 %
Lymphocytes Relative: 18 %
Lymphs Abs: 0.9 10*3/uL (ref 0.7–4.0)
MCH: 29.8 pg (ref 26.0–34.0)
MCHC: 32 g/dL (ref 30.0–36.0)
MCV: 93.4 fL (ref 80.0–100.0)
Monocytes Absolute: 0.6 10*3/uL (ref 0.1–1.0)
Monocytes Relative: 11 %
Neutro Abs: 3.5 10*3/uL (ref 1.7–7.7)
Neutrophils Relative %: 69 %
Platelets: 243 10*3/uL (ref 150–400)
RBC: 3.92 MIL/uL (ref 3.87–5.11)
RDW: 13.1 % (ref 11.5–15.5)
WBC: 5.1 10*3/uL (ref 4.0–10.5)
nRBC: 0 % (ref 0.0–0.2)

## 2020-10-01 LAB — COMPREHENSIVE METABOLIC PANEL
ALT: 15 U/L (ref 0–44)
AST: 18 U/L (ref 15–41)
Albumin: 3.8 g/dL (ref 3.5–5.0)
Alkaline Phosphatase: 79 U/L (ref 38–126)
Anion gap: 8 (ref 5–15)
BUN: 23 mg/dL (ref 8–23)
CO2: 30 mmol/L (ref 22–32)
Calcium: 9.4 mg/dL (ref 8.9–10.3)
Chloride: 99 mmol/L (ref 98–111)
Creatinine, Ser: 1.16 mg/dL — ABNORMAL HIGH (ref 0.44–1.00)
GFR, Estimated: 50 mL/min — ABNORMAL LOW (ref >60)
Glucose, Bld: 98 mg/dL (ref 70–99)
Potassium: 4.2 mmol/L (ref 3.5–5.1)
Sodium: 137 mmol/L (ref 135–145)
Total Bilirubin: 0.4 mg/dL (ref 0.3–1.2)
Total Protein: 7.2 g/dL (ref 6.5–8.1)

## 2020-10-01 LAB — VITAMIN B12: Vitamin B-12: 368 pg/mL (ref 180–914)

## 2020-10-01 LAB — URIC ACID: Uric Acid, Serum: 5.9 mg/dL (ref 2.5–7.1)

## 2020-10-01 LAB — IRON AND TIBC
Iron: 76 ug/dL (ref 28–170)
Saturation Ratios: 19 % (ref 10.4–31.8)
TIBC: 395 ug/dL (ref 250–450)
UIBC: 319 ug/dL

## 2020-10-01 LAB — FERRITIN: Ferritin: 27 ng/mL (ref 11–307)

## 2020-10-01 MED ORDER — SODIUM CHLORIDE 0.9 % IV SOLN
510.0000 mg | Freq: Once | INTRAVENOUS | Status: AC
  Administered 2020-10-01: 510 mg via INTRAVENOUS
  Filled 2020-10-01: qty 510

## 2020-10-01 MED ORDER — SODIUM CHLORIDE 0.9 % IV SOLN
Freq: Once | INTRAVENOUS | Status: AC
  Filled 2020-10-01: qty 250

## 2020-10-01 NOTE — Progress Notes (Signed)
No new concerns today. Fells fatigued and short of breath at times, which is baseline for her.

## 2020-10-01 NOTE — Progress Notes (Signed)
Patient tolerated Feraheme infusion well today, no concerns voiced. Patient discharged. Stable.  °

## 2020-10-01 NOTE — Progress Notes (Signed)
[facility] NOTE  Patient Care Team: Jearld Fenton, NP as PCP - General (Internal Medicine) Juanito Doom, MD as Consulting Physician (Pulmonary Disease) Forest Gleason, MD (Inactive) as Consulting Physician (Oncology) Gatha Mayer, MD as Consulting Physician (Gastroenterology) Leandrew Koyanagi, MD as Referring Physician (Ophthalmology)  CHIEF COMPLAINTS/PURPOSE OF CONSULTATION: Anemia  # ANEMIA- sec to IDA/CKD [Dr.Lateef-hemoglobin 11.5/iron saturation 13% ferritin 30; intolerant to p.o. iron.]# colonoscopy-~ 2016; [not until 2024]  # CKD stage III  # Hx of smoking quit  Oncology History   No history exists.    HISTORY OF PRESENTING ILLNESS:  Kristie Byrd 75 y.o.  female longstanding history of chronic kidney disease stage III/ongoing anemia is here for a follow up.  Kristie Byrd was last seen in clinic on 04/02/2020.  Kristie Byrd received 1 dose of IV Feraheme.  In the interim, Kristie Byrd has done well.  Admits to progressive fatigue and weakness since Kristie Byrd last infusion.  Denies any blood in stools or episodes of bleeding.  Kristie Byrd is unable to tolerate oral iron secondary to GI upset.  Denies any nausea or vomiting.    Admits to significant stressors in Kristie Byrd life recently.  Kristie Byrd daughter was diagnosed with colon cancer and recently had to have a kidney removed.  Kristie Byrd sees Kristie Byrd PCP Marshall Cork for anxiety and depression.   Review of Systems  Constitutional: Positive for malaise/fatigue. Negative for chills, diaphoresis, fever and weight loss.  HENT: Negative for nosebleeds and sore throat.   Eyes: Negative for double vision.  Respiratory: Negative for cough, hemoptysis, sputum production, shortness of breath and wheezing.   Cardiovascular: Negative for chest pain, palpitations, orthopnea and leg swelling.  Gastrointestinal: Positive for constipation. Negative for abdominal pain, blood in stool, diarrhea, heartburn, melena, nausea and vomiting.  Genitourinary: Negative  for dysuria, frequency and urgency.  Musculoskeletal: Negative for back pain and joint pain.  Skin: Negative.  Negative for itching and rash.  Neurological: Negative for dizziness, tingling, focal weakness, weakness and headaches.  Endo/Heme/Allergies: Does not bruise/bleed easily.  Psychiatric/Behavioral: Negative for depression. The patient is not nervous/anxious and does not have insomnia.      MEDICAL HISTORY:  Past Medical History:  Diagnosis Date  . Arthritis   . Cancer ([place])    cervix, skin  . Colon polyps   . COPD (chronic obstructive pulmonary disease) ([place])   . Depression   . GERD (gastroesophageal reflux disease)   . Headache   . Hypertension   . Iron deficiency anemia 09/27/2012  . PONV (postoperative nausea and vomiting)   . Shingles     SURGICAL HISTORY: Past Surgical History:  Procedure Laterality Date  . BREAST BIOPSY Bilateral    neg  . CARPAL TUNNEL RELEASE Bilateral   . COLONOSCOPY  multiple  . KNEE ARTHROPLASTY Right 05/12/2016   Procedure: COMPUTER ASSISTED TOTAL KNEE ARTHROPLASTY;  Surgeon: Dereck Leep, MD;  Location: [facility] ORS;  Service: Orthopedics;  Laterality: Right;  . KNEE SURGERY Right   . NOSE SURGERY    . TONSILLECTOMY      SOCIAL HISTORY: Social History   Socioeconomic History  . Marital status: Single    Spouse name: Not on file  . Number of children: 2  . Years of education: Not on file  . Highest education level: Not on file  Occupational History  . Occupation: retired  Tobacco Use  . Smoking status: Former Smoker    Packs/day: 1.00    Years: 37.00    Pack years:  37.00    Types: Cigarettes    Quit date: 11/15/1996    Years since quitting: 23.8  . Smokeless tobacco: Never Used  Vaping Use  . Vaping Use: Never used  Substance and Sexual Activity  . Alcohol use: No    Alcohol/week: 0.0 standard drinks  . Drug use: No    Comment: uses CBD oil   . Sexual activity: Never  Other Topics Concern  . Not on file  Social  History Narrative   Lives alone.  Divorced. Two daughters- in 28s, 3 grandchildren.   Does not know family history- raised in [facility].        Desires CPR.  Does not have HPOA.   Kristie Byrd would possibly want life support if for short period of time . Unsure about feeding tube.   Social Determinants of Health   Financial Resource Strain: Not on file  Food Insecurity: Not on file  Transportation Needs: Not on file  Physical Activity: Not on file  Stress: Not on file  Social Connections: Not on file  Intimate Partner Violence: Not on file    FAMILY HISTORY: Family History  Family history unknown: Yes    ALLERGIES:  is allergic to codeine, demerol [meperidine], fleet phospho-soda [sodium phosphates], latex, other, and prednisone.  MEDICATIONS:  Current Outpatient Medications  Medication Sig Dispense Refill  . acetaminophen (TYLENOL) 650 MG CR tablet Take by mouth.    Marland Kitchen buPROPion (WELLBUTRIN XL) 300 MG 24 hr tablet Take 1 tablet (300 mg total) by mouth daily. 90 tablet 0  . calcitRIOL (ROCALTROL) 0.25 MCG capsule Take 0.25 mcg by mouth daily.    . Cholecalciferol (VITAMIN D3) 1000 units CAPS Take 1,000 Units by mouth daily.    . ferric citrate (AURYXIA) 1 GM 210 MG(Fe) tablet Take 2 tablets (420 mg total) by mouth 2 (two) times daily with a meal. 120 tablet 5  . lisinopril (ZESTRIL) 5 MG tablet Take 1 tablet (5 mg total) by mouth daily. 90 tablet 0  . Multiple Vitamin (MULTIVITAMIN) tablet Take 1 tablet by mouth at bedtime.     . NON FORMULARY HEMP OIL: DAILY    . propranolol (INDERAL) 40 MG tablet Take 1 tablet (40 mg total) by mouth 2 (two) times daily. 180 tablet 0  . sertraline (ZOLOFT) 100 MG tablet Take 1 tablet (100 mg total) by mouth daily. 90 tablet 1  . Turmeric 450 MG CAPS Take 1 capsule by mouth daily.    . Lido-Menthol-Methyl Sal-Camph (CBD KINGS EX) Apply topically. (Patient not taking: Reported on 10/01/2020)     No current facility-administered medications  for this visit.      Marland Kitchen  PHYSICAL EXAMINATION: ECOG PERFORMANCE STATUS: 1 - Symptomatic but completely ambulatory  Vitals:   10/01/20 1337  BP: 117/74  Pulse: 62  Resp: 19  Temp: (!) 96.9 F (36.1 C)  SpO2: 96%   Filed Weights   10/01/20 1337  Weight: 238 lb (108 kg)    Physical Exam HENT:     Head: Normocephalic and atraumatic.     Mouth/Throat:     Pharynx: No oropharyngeal exudate.  Eyes:     Pupils: Pupils are equal, round, and reactive to light.  Cardiovascular:     Rate and Rhythm: Normal rate and regular rhythm.  Pulmonary:     Effort: No respiratory distress.     Breath sounds: No wheezing.  Abdominal:     General: Bowel sounds are normal. There is no distension.  Palpations: Abdomen is soft. There is no mass.     Tenderness: There is no abdominal tenderness. There is no guarding or rebound.  Musculoskeletal:        General: No tenderness. Normal range of motion.     Cervical back: Normal range of motion and neck supple.  Skin:    General: Skin is warm.  Neurological:     Mental Status: Kristie Byrd is alert and oriented to person, place, and time.  Psychiatric:        Mood and Affect: Affect normal.      LABORATORY DATA:  I have reviewed the data as listed Lab Results  Component Value Date   WBC 5.1 10/01/2020   HGB 11.7 (L) 10/01/2020   HCT 36.6 10/01/2020   MCV 93.4 10/01/2020   PLT 243 10/01/2020   Recent Labs    10/03/19 1324 04/02/20 1242 10/01/20 1300  NA 138 140 137  K 4.3 4.2 4.2  CL 99 99 99  CO2 30 33* 30  GLUCOSE 106* 113* 98  BUN 22 19 23   CREATININE 1.13* 1.09* 1.16*  CALCIUM 9.5 9.7 9.4  GFRNONAA 49* 54* 50*  GFRAA 56*  --   --   PROT  --   --  7.2  ALBUMIN  --   --  3.8  AST  --   --  18  ALT  --   --  15  ALKPHOS  --   --  79  BILITOT  --   --  0.4    RADIOGRAPHIC STUDIES: I have personally reviewed the radiological images as listed and agreed with the findings in the report. DG Bone Density  Result Date:  10/01/2020 EXAM: DUAL X-RAY ABSORPTIOMETRY (DXA) FOR BONE MINERAL DENSITY IMPRESSION: crr Your patient Brittlyn Cloe completed a BMD test on 09/30/2020 using the [place] (analysis version: 14.10) manufactured by [employer]. The following summarizes the results of our evaluation. PATIENT BIOGRAPHICAL: Name: Mariona, Scholes Patient ID: [id number] Birth Date: August 16, 1945 Height: 63.0 in. Gender: Female Exam Date: 09/30/2020 Weight: 237.4 lbs. Indications: Advanced Age, arthritis, Caucasian, copd, Early Menopause, Height Loss, History of Fracture (Adult), hx skin ca, kidney disease, POSTmenopausal, rt knee replacement Fractures: lt ankle, rt foot, sacrum, TOES Treatments: multivitamin, Vitamin D ASSESSMENT: The BMD measured at AP Spine L1-L4 is 0.873 g/cm2 with a T-score of -2.6. This patient is considered osteoporotic according to [facility]) criteria. The scan quality is good. Site Region Measured Measured WHO Young Adult BMD Date       Age      Classification T-score AP Spine L1-L4 09/30/2020 73.10 Osteoporosis -2.6 0.873 g/cm2 DualFemur Neck Right 09/30/2020 73.9 Osteopenia -2.4 0.699 g/cm2 World Health Organization [facility]) criteria for post-menopausal, Caucasian Women: Normal:       T-score at or above -1 SD Osteopenia:   T-score between -1 and -2.5 SD Osteoporosis: T-score at or below -2.5 SD RECOMMENDATIONS: 1. All patients should optimize calcium and vitamin D intake. 2. Consider FDA-approved medical therapies in postmenopausal women and men aged 45 years and older, based on the following: a. A hip or vertebral (clinical or morphometric) fracture b. T-score < -2.5 at the femoral neck or spine after appropriate evaluation to exclude secondary causes c. Low bone mass (T-score between -1.0 and -2.5 at the femoral neck or spine) and a 10-year probability of a hip fracture > 3% or a 10-year probability of a major osteoporosis-related fracture > 20% based on the [place]-adapted  WHO  algorithm d. Clinician judgment and/or patient preferences may indicate treatment for people with 10-year fracture probabilities above or below these levels FOLLOW-UP: People with diagnosed cases of osteoporosis or at high risk for fracture should have regular bone mineral density tests. For patients eligible for Medicare, routine testing is allowed once every 2 years. The testing frequency can be increased to one year for patients who have rapidly progressing disease, those who are receiving or discontinuing medical therapy to restore bone mass, or have additional risk factors. I have reviewed this report, and agree with the above findings. [facility] Radiology Electronically Signed   By: Lowella Grip III M.D.   On: 10/01/2020 08:01   MM 3D SCREEN BREAST BILATERAL  Result Date: 09/30/2020 CLINICAL DATA:  Screening. EXAM: DIGITAL SCREENING BILATERAL MAMMOGRAM WITH TOMOSYNTHESIS AND CAD TECHNIQUE: Bilateral screening digital craniocaudal and mediolateral oblique mammograms were obtained. Bilateral screening digital breast tomosynthesis was performed. The images were evaluated with computer-aided detection. COMPARISON:  Previous exam(s). ACR Breast Density Category b: There are scattered areas of fibroglandular density. FINDINGS: There are no findings suspicious for malignancy. The images were evaluated with computer-aided detection. IMPRESSION: No mammographic evidence of malignancy. A result letter of this screening mammogram will be mailed directly to the patient. RECOMMENDATION: Screening mammogram in one year. (Code:SM-B-01Y) BI-RADS CATEGORY  1: Negative. Electronically Signed   By: Ammie Ferrier M.D.   On: 09/30/2020 15:52    ASSESSMENT & PLAN:  Mild anemia -Secondary to CKD and IDA. -Labs from 10/01/2020 show hemoglobin of 11.7.  Ferritin and iron panel pending. -Kristie Byrd remains anemic and iron stores have consistently been on the lower end of normal. -Kristie Byrd is unable to tolerate oral iron  secondary to constipation. -Recommend 1 dose of IV Feraheme today.  CKD stage III: -Kristie Byrd is followed by Dr. Holley Raring.  GFR is 50.  DISPOSITION:  -Proceed with Feraheme today. -RTC in 6 months with repeat lab work MD assessment and possible IV iron.  Greater than 50% was spent in counseling and coordination of care with this patient including but not limited to discussion of the relevant topics above (See A&P) including, but not limited to diagnosis and management of acute and chronic medical conditions.   No problem-specific Assessment & Plan notes found for this encounter.  All questions were answered. The patient knows to call the clinic with any problems, questions or concerns.    Jacquelin Hawking, NP 10/01/2020 1:44 PM

## 2020-10-01 NOTE — Patient Instructions (Addendum)
[facility] [place]   Discharge Instructions: Thank you for choosing [facility] to provide your oncology and hematology care.  If you have a lab appointment with the Cancer Center, please go directly to the Cancer Center and check in at the registration area.  Wear comfortable clothing and clothing appropriate for easy access to any Portacath or PICC line.   We strive to give you quality time with your provider. You may need to reschedule your appointment if you arrive late (15 or more minutes).  Arriving late affects you and other patients whose appointments are after yours.  Also, if you miss three or more appointments without notifying the office, you may be dismissed from the clinic at the provider's discretion.      For prescription refill requests, have your pharmacy contact our office and allow 72 hours for refills to be completed.    Today you received Feraheme   To help prevent nausea and vomiting after your treatment, we encourage you to take your nausea medication as directed.  BELOW ARE SYMPTOMS THAT SHOULD BE REPORTED IMMEDIATELY: *FEVER GREATER THAN 100.4 F (38 C) OR HIGHER *CHILLS OR SWEATING *NAUSEA AND VOMITING THAT IS NOT CONTROLLED WITH YOUR NAUSEA MEDICATION *UNUSUAL SHORTNESS OF BREATH *UNUSUAL BRUISING OR BLEEDING *URINARY PROBLEMS (pain or burning when urinating, or frequent urination) *BOWEL PROBLEMS (unusual diarrhea, constipation, pain near the anus) TENDERNESS IN MOUTH AND THROAT WITH OR WITHOUT PRESENCE OF ULCERS (sore throat, sores in mouth, or a toothache) UNUSUAL RASH, SWELLING OR PAIN  UNUSUAL VAGINAL DISCHARGE OR ITCHING   Items with * indicate a potential emergency and should be followed up as soon as possible or go to the Emergency Department if any problems should occur.  Please show the CHEMOTHERAPY ALERT CARD or IMMUNOTHERAPY ALERT CARD at check-in to the Emergency Department and triage nurse.  Should  you have questions after your visit or need to cancel or reschedule your appointment, please contact [facility] [place] [facility]  [phone] and follow the prompts.  Office hours are 8:00 a.m. to 4:30 p.m. Monday - Friday. Please note that voicemails left after 4:00 p.m. may not be returned until the following business day.  We are closed weekends and major holidays. You have access to a nurse at all times for urgent questions. Please call the main number to the clinic [phone] and follow the prompts.  For any non-urgent questions, you may also contact your provider using MyChart. We now offer e-Visits for anyone 18 and older to request care online for non-urgent symptoms. For details visit [email or web address].   Also download the MyChart app! Go to the app store, search "MyChart", open the app, select [facility], and log in with your MyChart username and password.  Due to Covid, a mask is required upon entering the hospital/clinic. If you do not have a mask, one will be given to you upon arrival. For doctor visits, patients may have 1 support person aged 18 or older with them. For treatment visits, patients cannot have anyone with them due to current Covid guidelines and our immunocompromised population.  

## 2020-10-02 LAB — MICROALBUMIN / CREATININE URINE RATIO
Creatinine, Urine: 96.6 mg/dL
Microalb Creat Ratio: 3 mg/g creat (ref 0–29)
Microalb, Ur: 3.2 ug/mL — ABNORMAL HIGH

## 2020-10-02 LAB — PTH, INTACT AND CALCIUM
Calcium, Total (PTH): 9.8 mg/dL (ref 8.7–10.3)
PTH: 48 pg/mL (ref 15–65)

## 2020-10-14 DIAGNOSIS — I10 Essential (primary) hypertension: Secondary | ICD-10-CM | POA: Diagnosis not present

## 2020-10-14 DIAGNOSIS — D631 Anemia in chronic kidney disease: Secondary | ICD-10-CM | POA: Diagnosis not present

## 2020-10-14 DIAGNOSIS — N2581 Secondary hyperparathyroidism of renal origin: Secondary | ICD-10-CM | POA: Diagnosis not present

## 2020-10-14 DIAGNOSIS — N1831 Chronic kidney disease, stage 3a: Secondary | ICD-10-CM | POA: Diagnosis not present

## 2020-10-29 ENCOUNTER — Other Ambulatory Visit: Payer: Self-pay | Admitting: Internal Medicine

## 2020-10-29 ENCOUNTER — Other Ambulatory Visit: Payer: Self-pay | Admitting: Family Medicine

## 2020-10-29 DIAGNOSIS — F419 Anxiety disorder, unspecified: Secondary | ICD-10-CM

## 2020-10-29 DIAGNOSIS — R251 Tremor, unspecified: Secondary | ICD-10-CM

## 2020-10-29 DIAGNOSIS — I10 Essential (primary) hypertension: Secondary | ICD-10-CM

## 2020-10-29 NOTE — Telephone Encounter (Signed)
Last office visit 07/21/2020 with Avie Echevaria for establish care.  Not on current medication list.

## 2021-01-13 DIAGNOSIS — Z20822 Contact with and (suspected) exposure to covid-19: Secondary | ICD-10-CM | POA: Diagnosis not present

## 2021-01-24 DIAGNOSIS — Z20822 Contact with and (suspected) exposure to covid-19: Secondary | ICD-10-CM | POA: Diagnosis not present

## 2021-01-30 ENCOUNTER — Other Ambulatory Visit: Payer: Self-pay | Admitting: Internal Medicine

## 2021-01-30 DIAGNOSIS — R251 Tremor, unspecified: Secondary | ICD-10-CM

## 2021-01-30 DIAGNOSIS — I10 Essential (primary) hypertension: Secondary | ICD-10-CM

## 2021-01-30 DIAGNOSIS — F419 Anxiety disorder, unspecified: Secondary | ICD-10-CM

## 2021-01-30 DIAGNOSIS — F32A Depression, unspecified: Secondary | ICD-10-CM

## 2021-03-15 DIAGNOSIS — Z20822 Contact with and (suspected) exposure to covid-19: Secondary | ICD-10-CM | POA: Diagnosis not present

## 2021-03-16 ENCOUNTER — Encounter: Payer: Self-pay | Admitting: Internal Medicine

## 2021-03-16 ENCOUNTER — Telehealth (INDEPENDENT_AMBULATORY_CARE_PROVIDER_SITE_OTHER): Payer: PPO | Admitting: Internal Medicine

## 2021-03-16 ENCOUNTER — Ambulatory Visit: Payer: Self-pay | Admitting: *Deleted

## 2021-03-16 ENCOUNTER — Other Ambulatory Visit: Payer: Self-pay

## 2021-03-16 VITALS — Temp 98.4°F

## 2021-03-16 DIAGNOSIS — U071 COVID-19: Secondary | ICD-10-CM

## 2021-03-16 NOTE — Telephone Encounter (Signed)
Reason for Disposition  [1] CLOSE CONTACT COVID-19 EXPOSURE within last 14 days AND [2] weak immune system (e.g., HIV positive, cancer chemo, splenectomy, organ transplant, chronic steroids) AND [3] NO symptoms  Answer Assessment - Initial Assessment Questions 1. COVID-19 EXPOSURE: "Please describe how you were exposed to someone with a COVID-19 infection."     You tested positive yesterday for Covid.   The symptoms started Friday.   I had body aches bad, coughing sore throat yesterday but it's gone today.   I've had a bad headache Sat. And Sun.   I'm taking Tylenol for the fever.   Fever 101.2 orally.    2. PLACE of CONTACT: "Where were you when you were exposed to COVID-19?" (e.g., home, school, medical waiting room; which city?)     At work one of the guys was sick with Covid.    3. TYPE of CONTACT: "How much contact was there?" (e.g., sitting next to, live in same house, work in same office, same building)     Work 4. DURATION of CONTACT: "How long were you in contact with the COVID-19 patient?" (e.g., a few seconds, passed by person, a few minutes, 15 minutes or longer, live with the patient)     Maybe 8-9 minutes. 5. MASK: "Were you wearing a mask?" "Was the other person wearing a mask?" Note: wearing a mask reduces the risk of an otherwise close contact.     Yes I had a mask on.    I can't remember if he had a mask on or not.   6. DATE of CONTACT: "When did you have contact with a COVID-19 patient?" (e.g., how many days ago)    Wednesday.    7. COMMUNITY SPREAD: "Are there lots of cases of COVID-19 (community spread) where you live?" (See public health department web site, if unsure)       Yes 8. SYMPTOMS: "Do you have any symptoms?" (e.g., fever, cough, breathing difficulty, loss of taste or smell)     Yes  Fever, body aches, headache, coughing a lot, nothing coming up, sore throat but it's gone now,  runny nose.     A little diarrhea.   I've not eaten much since Fri.   No appetite.   I'm  weak so I don't feel like fixing anything.   I'm drinking coffee and tea and Coke.   I encouraged her to drink decaffeinated.    I can taste a little bit.  My tea tastes different.    9. VACCINE: "Have you gotten the COVID-19 vaccine?" If Yes, ask: "Which one, how many shots, when did you get it?"     I've had 2 shots of [place]. 10. BOOSTER: "Have you received your COVID-19 booster?" If Yes, ask: "Which one and when did you get it?"       No 11. PREGNANCY OR POSTPARTUM: "Is there any chance you are pregnant?" "When was your last menstrual period?" "Did you deliver in the last 2 weeks?"       N/A due to age 75. HIGH RISK: "Do you have any heart or lung problems?" (e.g., asthma , COPD, heart failure) "Do you have a weak immune system or other risk factors?" (e.g., HIV positive, chemotherapy, renal failure, diabetes mellitus, sickle cell anemia, obesity)       Age, kidney trouble. No heart problems or lung problems.  My pulse oximeter is 93% on room air.    I have a hard time walking due to a prior injury.  I get tired easily and I've put on weight.   13. TRAVEL: "Have you traveled out of the country recently?" If Yes, ask: "When and where?"  Note: Travel becomes less relevant if there is widespread community transmission where the patient lives.       No  Protocols used: Coronavirus (COVID-19) Exposure-A-AH

## 2021-03-16 NOTE — Patient Instructions (Signed)

## 2021-03-16 NOTE — Progress Notes (Signed)
Virtual Visit via Video Note  I connected with Kristie Byrd on 03/16/21 at  4:00 PM EDT by a video enabled telemedicine application and verified that I am speaking with the correct person using two identifiers.  Location: Patient: Home Provider: Office  Person's participating in this video call: Webb Silversmith, NP and Orlean Bradford   I discussed the limitations of evaluation and management by telemedicine and the availability of in person appointments. The patient expressed understanding and agreed to proceed.  History of Present Illness:  Pt reports headache, runny nose,sore throat, cough, chest congestion, diarrhea. She reports this started 3 days ago. The headache was located all over her head. She describes the pain as throbbing. She denies dizziness and visual changes. She is blowing clear mucous out of her nose. She denies difficulty swallowing. The cough is productive of thick clear mucous. She denies nasal congestion, ear pain, shortness of breath, nausea or vomiting. She reports fever, chills and body aches. She has tried Tylenol with some relief.  She tested positive for covid yesterday. She has had 2 covid vaccines. She has a history of COPD but does not currently use any inhalers.   Past Medical History:  Diagnosis Date   Arthritis    Cancer ([place])    cervix, skin   Colon polyps    COPD (chronic obstructive pulmonary disease) (HCC)    Depression    GERD (gastroesophageal reflux disease)    Headache    Hypertension    Iron deficiency anemia 09/27/2012   PONV (postoperative nausea and vomiting)    Shingles     Current Outpatient Medications  Medication Sig Dispense Refill   acetaminophen (TYLENOL) 650 MG CR tablet Take by mouth.     buPROPion (WELLBUTRIN XL) 300 MG 24 hr tablet TAKE 1 TABLET BY MOUTH ONCE DAILY 90 tablet 0   calcitRIOL (ROCALTROL) 0.25 MCG capsule Take 0.25 mcg by mouth daily.     Cholecalciferol (VITAMIN D3) 1000 units CAPS Take 1,000 Units by mouth  daily.     ferric citrate (AURYXIA) 1 GM 210 MG(Fe) tablet Take 2 tablets (420 mg total) by mouth 2 (two) times daily with a meal. 120 tablet 5   Lido-Menthol-Methyl Sal-Camph (CBD KINGS EX) Apply topically. (Patient not taking: Reported on 10/01/2020)     lisinopril (ZESTRIL) 5 MG tablet TAKE 1 TABLET BY MOUTH ONCE DAILY 90 tablet 0   Multiple Vitamin (MULTIVITAMIN) tablet Take 1 tablet by mouth at bedtime.      NON FORMULARY HEMP OIL: DAILY     propranolol (INDERAL) 40 MG tablet TAKE 1 TABLET BY MOUTH TWICE DAILY 180 tablet 0   sertraline (ZOLOFT) 100 MG tablet TAKE 1 TABLET BY MOUTH ONCE DAILY 90 tablet 0   Turmeric 450 MG CAPS Take 1 capsule by mouth daily.     No current facility-administered medications for this visit.    Allergies  Allergen Reactions   Codeine Nausea And Vomiting   Demerol [Meperidine] Nausea And Vomiting   Fleet Phospho-Soda [Sodium Phosphates] Nausea And Vomiting    Vomiting    Latex Rash    Blisters   Other Other (See Comments)    Band-Aid, blister   Prednisone Other (See Comments)    Chest pressure    Family History  Family history unknown: Yes    Social History   Socioeconomic History   Marital status: Single    Spouse name: Not on file   Number of children: 2   Years of education: Not  on file   Highest education level: Not on file  Occupational History   Occupation: retired  Tobacco Use   Smoking status: Former    Packs/day: 1.00    Years: 37.00    Pack years: 37.00    Types: Cigarettes    Quit date: 11/15/1996    Years since quitting: 24.3   Smokeless tobacco: Never  Vaping Use   Vaping Use: Never used  Substance and Sexual Activity   Alcohol use: No    Alcohol/week: 0.0 standard drinks   Drug use: No    Comment: uses CBD oil    Sexual activity: Never  Other Topics Concern   Not on file  Social History Narrative   Lives alone.  Divorced. Two daughters- in 58s, 3 grandchildren.   Does not know family history- raised in [facility].        Desires CPR.  Does not have HPOA.   She would possibly want life support if for short period of time . Unsure about feeding tube.   Social Determinants of Health   Financial Resource Strain: Not on file  Food Insecurity: Not on file  Transportation Needs: Not on file  Physical Activity: Not on file  Stress: Not on file  Social Connections: Not on file  Intimate Partner Violence: Not on file     Constitutional: Pt reports headache, fever, chills and body aches. Denies malaise, fatigue, or abrupt weight changes.  HEENT: Denies eye pain, eye redness, ear pain, ringing in the ears, wax buildup, runny nose, nasal congestion, bloody nose, or sore throat. Respiratory: Pt reports cough. Denies difficulty breathing, shortness of breath, or sputum production.   Cardiovascular: Denies chest pain, chest tightness, palpitations or swelling in the hands or feet.  Gastrointestinal: Pt reports diarrhea. Denies abdominal pain, bloating, constipation, or blood in the stool.   No other specific complaints in a complete review of systems (except as listed in HPI above).    Observations/Objective:   Wt Readings from Last 3 Encounters:  10/01/20 238 lb (108 kg)  09/16/20 238 lb 0.2 oz (108 kg)  07/21/20 237 lb (107.5 kg)    General: Appears her stated age, in NAD. HEENT: Head: normal shape and size; Nose: no congestion noted; Throat/Mouth: hoarseness noted. Pulmonary/Chest: Normal effort. No respiratory distress.  Neurological: Alert and oriented.   BMET    Component Value Date/Time   NA 137 10/01/2020 1300   NA 137 02/25/2014 1404   K 4.2 10/01/2020 1300   K 4.2 02/25/2014 1404   CL 99 10/01/2020 1300   CL 102 02/25/2014 1404   CO2 30 10/01/2020 1300   CO2 28 02/25/2014 1404   GLUCOSE 98 10/01/2020 1300   GLUCOSE 124 (H) 02/25/2014 1404   BUN 23 10/01/2020 1300   BUN 20 02/10/2017 0000   BUN 24 (H) 02/25/2014 1404   CREATININE 1.16 (H) 10/01/2020 1300    CREATININE 1.28 02/25/2014 1404   CALCIUM 9.8 10/01/2020 1300   CALCIUM 9.4 10/01/2020 1300   GFRNONAA 50 (L) 10/01/2020 1300   GFRNONAA 44 (L) 02/25/2014 1404   GFRNONAA 45 (L) 10/26/2013 1330   GFRAA 56 (L) 10/03/2019 1324   GFRAA 54 (L) 02/25/2014 1404   GFRAA 52 (L) 10/26/2013 1330    Lipid Panel     Component Value Date/Time   CHOL 191 04/02/2020 1242   TRIG 186 (H) 04/02/2020 1242   HDL 58 04/02/2020 1242   CHOLHDL 3.3 04/02/2020 1242   VLDL 37  04/02/2020 1242   LDLCALC 96 04/02/2020 1242    CBC    Component Value Date/Time   WBC 5.1 10/01/2020 1300   RBC 3.92 10/01/2020 1300   HGB 11.7 (L) 10/01/2020 1300   HGB 11.4 (L) 02/25/2014 1404   HCT 36.6 10/01/2020 1300   HCT 34.9 (L) 02/25/2014 1404   PLT 243 10/01/2020 1300   PLT 256 02/25/2014 1404   MCV 93.4 10/01/2020 1300   MCV 89 02/25/2014 1404   MCH 29.8 10/01/2020 1300   MCHC 32.0 10/01/2020 1300   RDW 13.1 10/01/2020 1300   RDW 14.7 (H) 02/25/2014 1404   LYMPHSABS 0.9 10/01/2020 1300   LYMPHSABS 1.1 02/25/2014 1404   MONOABS 0.6 10/01/2020 1300   MONOABS 0.5 02/25/2014 1404   EOSABS 0.0 10/01/2020 1300   EOSABS 0.0 02/25/2014 1404   BASOSABS 0.0 10/01/2020 1300   BASOSABS 0.1 02/25/2014 1404    Hgb A1C Lab Results  Component Value Date   HGBA1C 5.5 08/19/2017       Assessment and Plan:  Covid 19:  Encouraged rest and fluids She is improving will hold off on oral antiviral therapy at this time She has Coricidin HBP and she will start using this Continue Tylenol OTC as needed Return precautions discussed  Follow Up Instructions:    I discussed the assessment and treatment plan with the patient. The patient was provided an opportunity to ask questions and all were answered. The patient agreed with the plan and demonstrated an understanding of the instructions.   The patient was advised to call back or seek an in-person evaluation if the symptoms worsen or if the condition fails to  improve as anticipated.    Webb Silversmith, NP

## 2021-03-16 NOTE — Telephone Encounter (Signed)
Will discuss at upcoming appt.

## 2021-03-16 NOTE — Telephone Encounter (Signed)
Pt called in c/o coughing a lot, chest congestion, body aches, headache, no appetite, and is requesting something for the chest congestion.   She is positive for Covid.  Symptoms started on Friday.   Tested positive yesterday.  See triage notes.  I made her a virtual visit via MyChart for today with Webb Silversmith at 4:00.   She was familiar with doing virtual visit via [place].  I went over the quarantine and care advice with her and answered her questions.  I sent my notes to [employer].

## 2021-03-26 ENCOUNTER — Other Ambulatory Visit: Payer: Self-pay | Admitting: *Deleted

## 2021-03-26 DIAGNOSIS — D631 Anemia in chronic kidney disease: Secondary | ICD-10-CM

## 2021-03-26 DIAGNOSIS — D649 Anemia, unspecified: Secondary | ICD-10-CM

## 2021-03-26 DIAGNOSIS — E538 Deficiency of other specified B group vitamins: Secondary | ICD-10-CM

## 2021-03-27 ENCOUNTER — Telehealth: Payer: Self-pay | Admitting: Internal Medicine

## 2021-03-27 NOTE — Telephone Encounter (Signed)
Colette to reach out to patient.

## 2021-03-27 NOTE — Telephone Encounter (Signed)
Pt called to reschedule her appt. Please call back at [phone]

## 2021-04-01 ENCOUNTER — Inpatient Hospital Stay: Payer: PPO

## 2021-04-01 ENCOUNTER — Inpatient Hospital Stay: Payer: PPO | Admitting: Internal Medicine

## 2021-04-06 ENCOUNTER — Other Ambulatory Visit: Payer: Self-pay

## 2021-04-06 ENCOUNTER — Telehealth: Payer: Self-pay | Admitting: Internal Medicine

## 2021-04-06 ENCOUNTER — Inpatient Hospital Stay: Payer: PPO

## 2021-04-06 ENCOUNTER — Inpatient Hospital Stay: Payer: PPO | Attending: Internal Medicine | Admitting: Internal Medicine

## 2021-04-06 ENCOUNTER — Encounter: Payer: Self-pay | Admitting: Internal Medicine

## 2021-04-06 DIAGNOSIS — Z87891 Personal history of nicotine dependence: Secondary | ICD-10-CM | POA: Diagnosis not present

## 2021-04-06 DIAGNOSIS — Z79899 Other long term (current) drug therapy: Secondary | ICD-10-CM | POA: Insufficient documentation

## 2021-04-06 DIAGNOSIS — N183 Chronic kidney disease, stage 3 unspecified: Secondary | ICD-10-CM | POA: Insufficient documentation

## 2021-04-06 DIAGNOSIS — D631 Anemia in chronic kidney disease: Secondary | ICD-10-CM

## 2021-04-06 DIAGNOSIS — E538 Deficiency of other specified B group vitamins: Secondary | ICD-10-CM

## 2021-04-06 DIAGNOSIS — D649 Anemia, unspecified: Secondary | ICD-10-CM

## 2021-04-06 DIAGNOSIS — Z8616 Personal history of COVID-19: Secondary | ICD-10-CM | POA: Diagnosis not present

## 2021-04-06 LAB — CBC WITH DIFFERENTIAL/PLATELET
Abs Immature Granulocytes: 0.01 10*3/uL (ref 0.00–0.07)
Basophils Absolute: 0 10*3/uL (ref 0.0–0.1)
Basophils Relative: 1 %
Eosinophils Absolute: 0 10*3/uL (ref 0.0–0.5)
Eosinophils Relative: 0 %
HCT: 40.4 % (ref 36.0–46.0)
Hemoglobin: 12.7 g/dL (ref 12.0–15.0)
Immature Granulocytes: 0 %
Lymphocytes Relative: 17 %
Lymphs Abs: 1.1 10*3/uL (ref 0.7–4.0)
MCH: 29.5 pg (ref 26.0–34.0)
MCHC: 31.4 g/dL (ref 30.0–36.0)
MCV: 93.7 fL (ref 80.0–100.0)
Monocytes Absolute: 0.6 10*3/uL (ref 0.1–1.0)
Monocytes Relative: 10 %
Neutro Abs: 4.7 10*3/uL (ref 1.7–7.7)
Neutrophils Relative %: 72 %
Platelets: 264 10*3/uL (ref 150–400)
RBC: 4.31 MIL/uL (ref 3.87–5.11)
RDW: 13.2 % (ref 11.5–15.5)
WBC: 6.5 10*3/uL (ref 4.0–10.5)
nRBC: 0 % (ref 0.0–0.2)

## 2021-04-06 LAB — COMPREHENSIVE METABOLIC PANEL
ALT: 17 U/L (ref 0–44)
AST: 20 U/L (ref 15–41)
Albumin: 4.2 g/dL (ref 3.5–5.0)
Alkaline Phosphatase: 85 U/L (ref 38–126)
Anion gap: 6 (ref 5–15)
BUN: 23 mg/dL (ref 8–23)
CO2: 29 mmol/L (ref 22–32)
Calcium: 9.1 mg/dL (ref 8.9–10.3)
Chloride: 99 mmol/L (ref 98–111)
Creatinine, Ser: 1.15 mg/dL — ABNORMAL HIGH (ref 0.44–1.00)
GFR, Estimated: 50 mL/min — ABNORMAL LOW (ref >60)
Glucose, Bld: 93 mg/dL (ref 70–99)
Potassium: 4.3 mmol/L (ref 3.5–5.1)
Sodium: 134 mmol/L — ABNORMAL LOW (ref 135–145)
Total Bilirubin: 0.2 mg/dL — ABNORMAL LOW (ref 0.3–1.2)
Total Protein: 7.4 g/dL (ref 6.5–8.1)

## 2021-04-06 LAB — FERRITIN: Ferritin: 142 ng/mL (ref 11–307)

## 2021-04-06 LAB — IRON AND TIBC
Iron: 41 ug/dL (ref 28–170)
Saturation Ratios: 13 % (ref 10.4–31.8)
TIBC: 326 ug/dL (ref 250–450)
UIBC: 285 ug/dL

## 2021-04-06 LAB — VITAMIN B12: Vitamin B-12: 385 pg/mL (ref 180–914)

## 2021-04-06 NOTE — Telephone Encounter (Signed)
Referral Request - Has patient seen PCP for this complaint? yes *If NO, is insurance requiring patient see PCP for this issue before PCP can refer them? Referral for which specialty: sleep study Preferred provider/office: N/A Reason for referral: tired all the time

## 2021-04-06 NOTE — Assessment & Plan Note (Addendum)
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  Iron tablets: Constipation [Aurxya from neprhology].   # CKD- stage III [Dr.Lateef]-GFR 50  Followed by nephrology.  # fatigue: ? Etiology; ? OSA- recommend follow up with PCP re: referral; ? Recent COVID  # Vaccine: recommend cvid booster/ flu shot in 2 weeks or so.   # DISPOSITION: # HOLD Ferrahem infusion  # follow up in 6 months-labs/cbc/bmpiron studies/fertitin-/possible Ferrahem-Dr.B

## 2021-04-06 NOTE — Progress Notes (Signed)
[facility] NOTE  Patient Care Team: Jearld Fenton, NP as PCP - General (Internal Medicine) Juanito Doom, MD as Consulting Physician (Pulmonary Disease) Forest Gleason, MD as Consulting Physician (Oncology) Gatha Mayer, MD as Consulting Physician (Gastroenterology) Leandrew Koyanagi, MD as Referring Physician (Ophthalmology)  CHIEF COMPLAINTS/PURPOSE OF CONSULTATION: Anemia  # ANEMIA- sec to IDA/CKD [Dr.Lateef-hemoglobin 11.5/iron saturation 13% ferritin 30; intolerant to p.o. iron.]# colonoscopy-~ 2016; [not until 2024]  # CKD stage III  # Hx of smoking quit  Oncology History   No history exists.     HISTORY OF PRESENTING ILLNESS:  Kristie Byrd 75 y.o.  female longstanding history of chronic kidney disease stage III/ongoing anemia is here for a follow up.  Patient recently had COVID infection.  Self-limited.  Complains of ongoing fatigue. On p.o. iron as per nephrology.  No nausea no vomiting. No blood in stools or black or stools.   Review of Systems  Constitutional:  Positive for malaise/fatigue. Negative for chills, diaphoresis, fever and weight loss.  HENT:  Negative for nosebleeds and sore throat.   Eyes:  Negative for double vision.  Respiratory:  Negative for cough, hemoptysis, sputum production, shortness of breath and wheezing.   Cardiovascular:  Negative for chest pain, palpitations, orthopnea and leg swelling.  Gastrointestinal:  Positive for constipation. Negative for abdominal pain, blood in stool, diarrhea, heartburn, melena, nausea and vomiting.  Genitourinary:  Negative for dysuria, frequency and urgency.  Musculoskeletal:  Negative for back pain and joint pain.  Skin: Negative.  Negative for itching and rash.  Neurological:  Negative for dizziness, tingling, focal weakness, weakness and headaches.  Endo/Heme/Allergies:  Does not bruise/bleed easily.  Psychiatric/Behavioral:  Negative for depression. The patient is not  nervous/anxious and does not have insomnia.     MEDICAL HISTORY:  Past Medical History:  Diagnosis Date   Arthritis    Cancer ([place])    cervix, skin   Colon polyps    COPD (chronic obstructive pulmonary disease) (HCC)    Depression    GERD (gastroesophageal reflux disease)    Headache    Hypertension    Iron deficiency anemia 09/27/2012   PONV (postoperative nausea and vomiting)    Shingles     SURGICAL HISTORY: Past Surgical History:  Procedure Laterality Date   BREAST BIOPSY Bilateral    neg   CARPAL TUNNEL RELEASE Bilateral    COLONOSCOPY  multiple   KNEE ARTHROPLASTY Right 05/12/2016   Procedure: COMPUTER ASSISTED TOTAL KNEE ARTHROPLASTY;  Surgeon: Dereck Leep, MD;  Location: [facility] ORS;  Service: Orthopedics;  Laterality: Right;   KNEE SURGERY Right    NOSE SURGERY     TONSILLECTOMY      SOCIAL HISTORY: Social History   Socioeconomic History   Marital status: Single    Spouse name: Not on file   Number of children: 2   Years of education: Not on file   Highest education level: Not on file  Occupational History   Occupation: retired  Tobacco Use   Smoking status: Former    Packs/day: 1.00    Years: 37.00    Pack years: 37.00    Types: Cigarettes    Quit date: 11/15/1996    Years since quitting: 24.4   Smokeless tobacco: Never  Vaping Use   Vaping Use: Never used  Substance and Sexual Activity   Alcohol use: No    Alcohol/week: 0.0 standard drinks   Drug use: No    Comment: uses CBD  oil    Sexual activity: Never  Other Topics Concern   Not on file  Social History Narrative   Lives alone.  Divorced. Two daughters- in 57s, 3 grandchildren.   Does not know family history- raised in [facility].        Desires CPR.  Does not have HPOA.   She would possibly want life support if for short period of time . Unsure about feeding tube.   Social Determinants of Health   Financial Resource Strain: Not on file  Food Insecurity: Not on file   Transportation Needs: Not on file  Physical Activity: Not on file  Stress: Not on file  Social Connections: Not on file  Intimate Partner Violence: Not on file    FAMILY HISTORY: Family History  Family history unknown: Yes    ALLERGIES:  is allergic to codeine, demerol [meperidine], fleet phospho-soda [sodium phosphates], latex, other, and prednisone.  MEDICATIONS:  Current Outpatient Medications  Medication Sig Dispense Refill   buPROPion (WELLBUTRIN XL) 300 MG 24 hr tablet TAKE 1 TABLET BY MOUTH ONCE DAILY 90 tablet 0   calcitRIOL (ROCALTROL) 0.25 MCG capsule Take 0.25 mcg by mouth daily.     Cholecalciferol (VITAMIN D3) 1000 units CAPS Take 1,000 Units by mouth daily.     ferric citrate (AURYXIA) 1 GM 210 MG(Fe) tablet Take 2 tablets (420 mg total) by mouth 2 (two) times daily with a meal. 120 tablet 5   lisinopril (ZESTRIL) 5 MG tablet TAKE 1 TABLET BY MOUTH ONCE DAILY 90 tablet 0   Multiple Vitamin (MULTIVITAMIN) tablet Take 1 tablet by mouth at bedtime.      sertraline (ZOLOFT) 100 MG tablet TAKE 1 TABLET BY MOUTH ONCE DAILY 90 tablet 0   acetaminophen (TYLENOL) 650 MG CR tablet Take 1,000 mg by mouth every 4 (four) hours as needed. (Patient not taking: Reported on 04/06/2021)     DM-APAP-CPM (CORICIDIN HBP) 10-325-2 MG TABS Take by mouth. (Patient not taking: Reported on 04/06/2021)     NON FORMULARY HEMP OIL: DAILY (Patient not taking: Reported on 04/06/2021)     propranolol (INDERAL) 40 MG tablet TAKE 1 TABLET BY MOUTH TWICE DAILY (Patient not taking: Reported on 04/06/2021) 180 tablet 0   Turmeric 450 MG CAPS Take 1 capsule by mouth daily. (Patient not taking: Reported on 04/06/2021)     No current facility-administered medications for this visit.      Marland Kitchen  PHYSICAL EXAMINATION: ECOG PERFORMANCE STATUS: 1 - Symptomatic but completely ambulatory  Vitals:   04/06/21 1349  BP: 132/81  Pulse: 68  Resp: 18  Temp: 97.6 F (36.4 C)   Filed Weights   04/06/21 1349   Weight: 226 lb 4.8 oz (102.6 kg)    Physical Exam HENT:     Head: Normocephalic and atraumatic.     Mouth/Throat:     Pharynx: No oropharyngeal exudate.  Eyes:     Pupils: Pupils are equal, round, and reactive to light.  Cardiovascular:     Rate and Rhythm: Normal rate and regular rhythm.  Pulmonary:     Effort: No respiratory distress.     Breath sounds: No wheezing.  Abdominal:     General: Bowel sounds are normal. There is no distension.     Palpations: Abdomen is soft. There is no mass.     Tenderness: There is no abdominal tenderness. There is no guarding or rebound.  Musculoskeletal:        General: No tenderness. Normal range  of motion.     Cervical back: Normal range of motion and neck supple.  Skin:    General: Skin is warm.  Neurological:     Mental Status: She is alert and oriented to person, place, and time.  Psychiatric:        Mood and Affect: Affect normal.     LABORATORY DATA:  I have reviewed the data as listed Lab Results  Component Value Date   WBC 6.5 04/06/2021   HGB 12.7 04/06/2021   HCT 40.4 04/06/2021   MCV 93.7 04/06/2021   PLT 264 04/06/2021   Recent Labs    10/01/20 1300 04/06/21 1314  NA 137 134*  K 4.2 4.3  CL 99 99  CO2 30 29  GLUCOSE 98 93  BUN 23 23  CREATININE 1.16* 1.15*  CALCIUM 9.4  9.8 9.1  GFRNONAA 50* 50*  PROT 7.2 7.4  ALBUMIN 3.8 4.2  AST 18 20  ALT 15 17  ALKPHOS 79 85  BILITOT 0.4 0.2*    RADIOGRAPHIC STUDIES: I have personally reviewed the radiological images as listed and agreed with the findings in the report. No results found.  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  Iron tablets: Constipation [Aurxya from neprhology].   # CKD- stage III [Dr.Lateef]-GFR 50  Followed by nephrology.  # fatigue: ? Etiology; ? OSA- recommend follow up with PCP re:  referral; ? Recent COVID  # Vaccine: recommend cvid booster/ flu shot in 2 weeks or so.   # DISPOSITION: # HOLD Ferrahem infusion  # follow up in 6 months-labs/cbc/bmpiron studies/fertitin-/possible Ferrahem-Dr.B   All questions were answered. The patient knows to call the clinic with any problems, questions or concerns.    Cammie Sickle, MD 04/06/2021 2:34 PM

## 2021-04-06 NOTE — Progress Notes (Signed)
Pt here for follow up. Reports increased hand tremors, especially in the mornings.

## 2021-04-07 LAB — PTH, INTACT AND CALCIUM
Calcium, Total (PTH): 9.7 mg/dL (ref 8.7–10.3)
PTH: 38 pg/mL (ref 15–65)

## 2021-04-14 NOTE — Telephone Encounter (Signed)
She needs to make an appt to  discuss this

## 2021-04-15 NOTE — Telephone Encounter (Signed)
Appt scheduled to come in on Dec 8th to discuss getting a referral for a sleep study.

## 2021-04-17 ENCOUNTER — Ambulatory Visit: Payer: Self-pay | Admitting: *Deleted

## 2021-04-17 NOTE — Telephone Encounter (Signed)
Pt stated she possibly has a UTI. Has a lot of pressure during urination.  Pt has appointment on 04/23/21 she stated she cannot wait no available appointments until 04/20/2021   Please advise.   Attempted to call patient- left message to call office

## 2021-04-17 NOTE — Telephone Encounter (Signed)
Pt. Reports she started having urinary pressure 2 days ago. Thinks she may have a UTI. No availability in the practice today. Instructed to go to UC. Declines. Asking if she can come in and have her urine checked. Office currently closed for lunch. Please advise pt.    Answer Assessment - Initial Assessment Questions 1. SYMPTOM: "What's the main symptom you're concerned about?" (e.g., frequency, incontinence)     Pressure 2. ONSET: "When did the  pressure  start?"     2 days ago 3. PAIN: "Is there any pain?" If Yes, ask: "How bad is it?" (Scale: 1-10; mild, moderate, severe)     No 4. CAUSE: "What do you think is causing the symptoms?"     UTI 5. OTHER SYMPTOMS: "Do you have any other symptoms?" (e.g., fever, flank pain, blood in urine, pain with urination)     nO 6. PREGNANCY: "Is there any chance you are pregnant?" "When was your last menstrual period?"     No  Protocols used: Urinary Symptoms-A-AH

## 2021-04-21 NOTE — Telephone Encounter (Signed)
The pt was offered a sooner appt, but she declined. She said she do not want to miss time out of work.

## 2021-04-23 ENCOUNTER — Ambulatory Visit (INDEPENDENT_AMBULATORY_CARE_PROVIDER_SITE_OTHER): Payer: PPO | Admitting: Internal Medicine

## 2021-04-23 ENCOUNTER — Other Ambulatory Visit: Payer: Self-pay

## 2021-04-23 ENCOUNTER — Encounter: Payer: Self-pay | Admitting: Internal Medicine

## 2021-04-23 VITALS — BP 123/51 | HR 67 | Temp 97.6°F | Resp 17 | Ht 64.0 in | Wt 227.0 lb

## 2021-04-23 DIAGNOSIS — R3989 Other symptoms and signs involving the genitourinary system: Secondary | ICD-10-CM | POA: Diagnosis not present

## 2021-04-23 DIAGNOSIS — R0683 Snoring: Secondary | ICD-10-CM | POA: Diagnosis not present

## 2021-04-23 DIAGNOSIS — R300 Dysuria: Secondary | ICD-10-CM | POA: Diagnosis not present

## 2021-04-23 DIAGNOSIS — R5382 Chronic fatigue, unspecified: Secondary | ICD-10-CM

## 2021-04-23 DIAGNOSIS — Z6838 Body mass index (BMI) 38.0-38.9, adult: Secondary | ICD-10-CM

## 2021-04-23 DIAGNOSIS — R400 Somnolence: Secondary | ICD-10-CM | POA: Diagnosis not present

## 2021-04-23 LAB — POCT URINALYSIS DIPSTICK
Bilirubin, UA: NEGATIVE
Blood, UA: NEGATIVE
Glucose, UA: NEGATIVE
Ketones, UA: NEGATIVE
Nitrite, UA: NEGATIVE
Protein, UA: NEGATIVE
Spec Grav, UA: 1.015 (ref 1.010–1.025)
Urobilinogen, UA: 0.2 E.U./dL
pH, UA: 5 (ref 5.0–8.0)

## 2021-04-23 MED ORDER — NITROFURANTOIN MONOHYD MACRO 100 MG PO CAPS: 100.0000 mg | ORAL_CAPSULE | Freq: Two times a day (BID) | ORAL | 0 refills | Status: DC

## 2021-04-23 NOTE — Patient Instructions (Signed)
Sleep Apnea ?Sleep apnea affects breathing during sleep. It causes breathing to stop for 10 seconds or more, or to become shallow. People with sleep apnea usually snore loudly. ?It can also increase the risk of: ?Heart attack. ?Stroke. ?Being very overweight (obese). ?Diabetes. ?Heart failure. ?Irregular heartbeat. ?High blood pressure. ?The goal of treatment is to help you breathe normally again. ?What are the causes? ?The most common cause of this condition is a collapsed or blocked airway. ?There are three kinds of sleep apnea: ?Obstructive sleep apnea. This is caused by a blocked or collapsed airway. ?Central sleep apnea. This happens when the brain does not send the right signals to the muscles that control breathing. ?Mixed sleep apnea. This is a combination of obstructive and central sleep apnea. ?What increases the risk? ?Being overweight. ?Smoking. ?Having a small airway. ?Being older. ?Being female. ?Drinking alcohol. ?Taking medicines to calm yourself (sedatives or tranquilizers). ?Having family members with the condition. ?Having a tongue or tonsils that are larger than normal. ?What are the signs or symptoms? ?Trouble staying asleep. ?Loud snoring. ?Headaches in the morning. ?Waking up gasping. ?Dry mouth or sore throat in the morning. ?Being sleepy or tired during the day. ?If you are sleepy or tired during the day, you may also: ?Not be able to focus your mind (concentrate). ?Forget things. ?Get angry a lot and have mood swings. ?Feel sad (depressed). ?Have changes in your personality. ?Have less interest in sex, if you are female. ?Be unable to have an erection, if you are female. ?How is this treated? ? ?Sleeping on your side. ?Using a medicine to get rid of mucus in your nose (decongestant). ?Avoiding the use of alcohol, medicines to help you relax, or certain pain medicines (narcotics). ?Losing weight, if needed. ?Changing your diet. ?Quitting smoking. ?Using a machine to open your airway while you  sleep, such as: ?An oral appliance. This is a mouthpiece that shifts your lower jaw forward. ?A CPAP device. This device blows air through a mask when you breathe out (exhale). ?An EPAP device. This has valves that you put in each nostril. ?A BIPAP device. This device blows air through a mask when you breathe in (inhale) and breathe out. ?Having surgery if other treatments do not work. ?Follow these instructions at home: ?Lifestyle ?Make changes that your doctor recommends. ?Eat a healthy diet. ?Lose weight if needed. ?Avoid alcohol, medicines to help you relax, and some pain medicines. ?Do not smoke or use any products that contain nicotine or tobacco. If you need help quitting, ask your doctor. ?General instructions ?Take over-the-counter and prescription medicines only as told by your doctor. ?If you were given a machine to use while you sleep, use it only as told by your doctor. ?If you are having surgery, make sure to tell your doctor you have sleep apnea. You may need to bring your device with you. ?Keep all follow-up visits. ?Contact a doctor if: ?The machine that you were given to use during sleep bothers you or does not seem to be working. ?You do not get better. ?You get worse. ?Get help right away if: ?Your chest hurts. ?You have trouble breathing in enough air. ?You have an uncomfortable feeling in your back, arms, or stomach. ?You have trouble talking. ?One side of your body feels weak. ?A part of your face is hanging down. ?These symptoms may be an emergency. Get help right away. Call your local emergency services (911 in the [place] ?Do not wait to see if the symptoms   will go away. ?Do not drive yourself to the hospital. ?Summary ?This condition affects breathing during sleep. ?The most common cause is a collapsed or blocked airway. ?The goal of treatment is to help you breathe normally while you sleep. ?This information is not intended to replace advice given to you by your health care provider. Make  sure you discuss any questions you have with your health care provider. ?Document Revised: 12/10/2020 Document Reviewed: 04/11/2020 ?Elsevier Patient Education ? 2022 [employer] ? ?

## 2021-04-23 NOTE — Addendum Note (Signed)
Addended by: Wilson Singer on: 04/23/2021 03:45 PM   Modules accepted: Orders

## 2021-04-23 NOTE — Progress Notes (Signed)
Subjective:    Patient ID: Kristie Byrd, female    DOB: Nov 27, 1945, 75 y.o.   MRN: [id number]  HPI  Pt presents to the clinic today requesting a referral for a sleep study. She reports chronic fatigue, snoring at night and daytime somnolence. She has never had a sleep study in the past.  She also reports bladder pressure and burning with urination. She reports this started 1 week ago. She denies urgency, frequency, dysuria, blood in her urine, fever, chills, nausea or low back pain. She has not tried anything OTC for this.  Review of Systems     Past Medical History:  Diagnosis Date   Arthritis    Cancer ([place])    cervix, skin   Colon polyps    COPD (chronic obstructive pulmonary disease) (HCC)    Depression    GERD (gastroesophageal reflux disease)    Headache    Hypertension    Iron deficiency anemia 09/27/2012   PONV (postoperative nausea and vomiting)    Shingles     Current Outpatient Medications  Medication Sig Dispense Refill   acetaminophen (TYLENOL) 650 MG CR tablet Take 1,000 mg by mouth every 4 (four) hours as needed. (Patient not taking: Reported on 04/06/2021)     buPROPion (WELLBUTRIN XL) 300 MG 24 hr tablet TAKE 1 TABLET BY MOUTH ONCE DAILY 90 tablet 0   calcitRIOL (ROCALTROL) 0.25 MCG capsule Take 0.25 mcg by mouth daily.     Cholecalciferol (VITAMIN D3) 1000 units CAPS Take 1,000 Units by mouth daily.     DM-APAP-CPM (CORICIDIN HBP) 10-325-2 MG TABS Take by mouth. (Patient not taking: Reported on 04/06/2021)     ferric citrate (AURYXIA) 1 GM 210 MG(Fe) tablet Take 2 tablets (420 mg total) by mouth 2 (two) times daily with a meal. 120 tablet 5   lisinopril (ZESTRIL) 5 MG tablet TAKE 1 TABLET BY MOUTH ONCE DAILY 90 tablet 0   Multiple Vitamin (MULTIVITAMIN) tablet Take 1 tablet by mouth at bedtime.      NON FORMULARY HEMP OIL: DAILY (Patient not taking: Reported on 04/06/2021)     propranolol (INDERAL) 40 MG tablet TAKE 1 TABLET BY MOUTH TWICE DAILY (Patient  not taking: Reported on 04/06/2021) 180 tablet 0   sertraline (ZOLOFT) 100 MG tablet TAKE 1 TABLET BY MOUTH ONCE DAILY 90 tablet 0   Turmeric 450 MG CAPS Take 1 capsule by mouth daily. (Patient not taking: Reported on 04/06/2021)     No current facility-administered medications for this visit.    Allergies  Allergen Reactions   Codeine Nausea And Vomiting   Demerol [Meperidine] Nausea And Vomiting   Fleet Phospho-Soda [Sodium Phosphates] Nausea And Vomiting    Vomiting    Latex Rash    Blisters   Other Other (See Comments)    Band-Aid, blister   Prednisone Other (See Comments)    Chest pressure    Family History  Family history unknown: Yes    Social History   Socioeconomic History   Marital status: Single    Spouse name: Not on file   Number of children: 2   Years of education: Not on file   Highest education level: Not on file  Occupational History   Occupation: retired  Tobacco Use   Smoking status: Former    Packs/day: 1.00    Years: 37.00    Pack years: 37.00    Types: Cigarettes    Quit date: 11/15/1996    Years since quitting: 24.4  Smokeless tobacco: Never  Vaping Use   Vaping Use: Never used  Substance and Sexual Activity   Alcohol use: No    Alcohol/week: 0.0 standard drinks   Drug use: No    Comment: uses CBD oil    Sexual activity: Never  Other Topics Concern   Not on file  Social History Narrative   Lives alone.  Divorced. Two daughters- in 27s, 3 grandchildren.   Does not know family history- raised in [facility].        Desires CPR.  Does not have HPOA.   She would possibly want life support if for short period of time . Unsure about feeding tube.   Social Determinants of Health   Financial Resource Strain: Not on file  Food Insecurity: Not on file  Transportation Needs: Not on file  Physical Activity: Not on file  Stress: Not on file  Social Connections: Not on file  Intimate Partner Violence: Not on file      Constitutional: Pt reports fatigue. Denies fever, malaise, headache or abrupt weight changes.  Respiratory: Denies difficulty breathing, shortness of breath, cough or sputum production.   Cardiovascular: Denies chest pain, chest tightness, palpitations or swelling in the hands or feet.  Gastrointestinal: Pt reports bladder pressure. Denies abdominal pain, bloating, constipation, diarrhea or blood in the stool.  GU: Pt reports burning with urination. Denies urgency, frequency, pain with urination,  blood in urine, odor or discharge.  No other specific complaints in a complete review of systems (except as listed in HPI above).  Objective:   Physical Exam  BP (!) 123/51 (BP Location: Right Arm, Patient Position: Sitting, Cuff Size: Large)   Pulse 67   Temp 97.6 F (36.4 C) (Temporal)   Resp 17   Ht 5\' 4"  (1.626 m)   Wt 227 lb (103 kg)   SpO2 99%   BMI 38.96 kg/m   Wt Readings from Last 3 Encounters:  04/06/21 226 lb 4.8 oz (102.6 kg)  10/01/20 238 lb (108 kg)  09/16/20 238 lb 0.2 oz (108 kg)    General: Appears her stated age, obese, in NAD. HEENT: Head: normal shape and size; Eyes: sclera white and EOMs intact;  Cardiovascular: Normal rate. Pulmonary/Chest: Normal effort and positive vesicular breath sounds.  Abdomen: Soft and nontender. No CVA tenderness noted. Neurological: Alert and oriented.  BMET    Component Value Date/Time   NA 134 (L) 04/06/2021 1314   NA 137 02/25/2014 1404   K 4.3 04/06/2021 1314   K 4.2 02/25/2014 1404   CL 99 04/06/2021 1314   CL 102 02/25/2014 1404   CO2 29 04/06/2021 1314   CO2 28 02/25/2014 1404   GLUCOSE 93 04/06/2021 1314   GLUCOSE 124 (H) 02/25/2014 1404   BUN 23 04/06/2021 1314   BUN 20 02/10/2017 0000   BUN 24 (H) 02/25/2014 1404   CREATININE 1.15 (H) 04/06/2021 1314   CREATININE 1.28 02/25/2014 1404   CALCIUM 9.7 04/06/2021 1322   CALCIUM 9.1 04/06/2021 1314   GFRNONAA 50 (L) 04/06/2021 1314   GFRNONAA 44 (L)  02/25/2014 1404   GFRNONAA 45 (L) 10/26/2013 1330   GFRAA 56 (L) 10/03/2019 1324   GFRAA 54 (L) 02/25/2014 1404   GFRAA 52 (L) 10/26/2013 1330    Lipid Panel     Component Value Date/Time   CHOL 191 04/02/2020 1242   TRIG 186 (H) 04/02/2020 1242   HDL 58 04/02/2020 1242   CHOLHDL 3.3 04/02/2020 1242  VLDL 37 04/02/2020 1242   LDLCALC 96 04/02/2020 1242    CBC    Component Value Date/Time   WBC 6.5 04/06/2021 1314   RBC 4.31 04/06/2021 1314   HGB 12.7 04/06/2021 1314   HGB 11.4 (L) 02/25/2014 1404   HCT 40.4 04/06/2021 1314   HCT 34.9 (L) 02/25/2014 1404   PLT 264 04/06/2021 1314   PLT 256 02/25/2014 1404   MCV 93.7 04/06/2021 1314   MCV 89 02/25/2014 1404   MCH 29.5 04/06/2021 1314   MCHC 31.4 04/06/2021 1314   RDW 13.2 04/06/2021 1314   RDW 14.7 (H) 02/25/2014 1404   LYMPHSABS 1.1 04/06/2021 1314   LYMPHSABS 1.1 02/25/2014 1404   MONOABS 0.6 04/06/2021 1314   MONOABS 0.5 02/25/2014 1404   EOSABS 0.0 04/06/2021 1314   EOSABS 0.0 02/25/2014 1404   BASOSABS 0.0 04/06/2021 1314   BASOSABS 0.1 02/25/2014 1404    Hgb A1C Lab Results  Component Value Date   HGBA1C 5.5 08/19/2017           Assessment & Plan:   Chronic Fatigue, Daytime Somnolence, Snoring:  ESS score of 14 Referral to pulmonology placed for further evaluation and sleep study  Burning with Urination, Bladder Pressure:  Urinalysis: 3+ blood Will send urine culture Push fluids RX for Macrobid 100 mg BID x 5 days  Return precautions discussed Webb Silversmith, NP This visit occurred during the SARS-CoV-2 public health emergency.  Safety protocols were in place, including screening questions prior to the visit, additional usage of staff PPE, and extensive cleaning of exam room while observing appropriate contact time as indicated for disinfecting solutions.

## 2021-04-24 ENCOUNTER — Telehealth: Payer: Self-pay

## 2021-04-24 NOTE — Telephone Encounter (Signed)
Copied from [place] [phone]. Topic: General - Other >> Apr 23, 2021  3:25 PM Tessa Lerner A wrote: Reason for CRM: The patient would like to be contacted regarding a previously discussed urine culture  The patient has additional questions about testing and potential diagnosis   Please contact when available

## 2021-04-24 NOTE — Telephone Encounter (Signed)
The pt question was answer. She verbalize understanding.

## 2021-04-25 LAB — URINE CULTURE
MICRO NUMBER:: 12736595
SPECIMEN QUALITY:: ADEQUATE

## 2021-05-04 ENCOUNTER — Encounter: Payer: Self-pay | Admitting: Internal Medicine

## 2021-05-04 DIAGNOSIS — I10 Essential (primary) hypertension: Secondary | ICD-10-CM

## 2021-05-04 MED ORDER — LISINOPRIL 5 MG PO TABS: 5.0000 mg | ORAL_TABLET | Freq: Every day | ORAL | 0 refills | Status: DC

## 2021-05-05 ENCOUNTER — Other Ambulatory Visit: Payer: Self-pay | Admitting: Internal Medicine

## 2021-05-05 DIAGNOSIS — F32A Depression, unspecified: Secondary | ICD-10-CM

## 2021-05-05 DIAGNOSIS — F419 Anxiety disorder, unspecified: Secondary | ICD-10-CM

## 2021-05-05 DIAGNOSIS — R251 Tremor, unspecified: Secondary | ICD-10-CM

## 2021-05-06 ENCOUNTER — Other Ambulatory Visit: Payer: Self-pay | Admitting: Internal Medicine

## 2021-05-06 DIAGNOSIS — R251 Tremor, unspecified: Secondary | ICD-10-CM

## 2021-05-06 NOTE — Telephone Encounter (Signed)
Requested Prescriptions  Pending Prescriptions Disp Refills   propranolol (INDERAL) 40 MG tablet [Pharmacy Med Name: PROPRANOLOL HCL 40 MG TAB] 180 tablet 0    Sig: TAKE 1 TABLET BY MOUTH TWICE DAILY     Cardiovascular:  Beta Blockers Passed - 05/05/2021  1:51 PM      Passed - Last BP in normal range    BP Readings from Last 1 Encounters:  04/23/21 (!) 123/51         Passed - Last Heart Rate in normal range    Pulse Readings from Last 1 Encounters:  04/23/21 67         Passed - Valid encounter within last 6 months    Recent Outpatient Visits          1 week ago Burning with urination   [facility] [place], Coralie Keens, NP   1 month ago [facility] [place], Coralie Keens, NP   7 months ago Medicare annual wellness visit, subsequent   [facility] [place], Coralie Keens, NP      Future Appointments            In 4 months [place], Coralie Keens, NP [facility]            buPROPion (WELLBUTRIN XL) 300 MG 24 hr tablet [Pharmacy Med Name: BUPROPION HCL ER (XL) 300 MG TAB] 90 tablet 0    Sig: TAKE 1 TABLET BY MOUTH ONCE DAILY     Psychiatry: Antidepressants - bupropion Passed - 05/05/2021  1:51 PM      Passed - Completed PHQ-2 or PHQ-9 in the last 360 days      Passed - Last BP in normal range    BP Readings from Last 1 Encounters:  04/23/21 (!) 123/51         Passed - Valid encounter within last 6 months    Recent Outpatient Visits          1 week ago Burning with urination   [facility] [place], Coralie Keens, NP   1 month ago [facility] [place], Coralie Keens, NP   7 months ago Medicare annual wellness visit, subsequent   [facility] [place], Coralie Keens, NP      Future Appointments            In 4 months Baity, Coralie Keens, NP [facility], [place]            sertraline (ZOLOFT) 100 MG tablet [Pharmacy Med Name: SERTRALINE HCL 100 MG TAB] 90 tablet 0     Sig: TAKE 1 TABLET BY MOUTH ONCE DAILY     Psychiatry:  Antidepressants - SSRI Passed - 05/05/2021  1:51 PM      Passed - Completed PHQ-2 or PHQ-9 in the last 360 days      Passed - Valid encounter within last 6 months    Recent Outpatient Visits          1 week ago Burning with urination   [facility] [place], Coralie Keens, NP   1 month ago [facility] [place], Coralie Keens, NP   7 months ago Medicare annual wellness visit, subsequent   [facility] [place], Coralie Keens, NP      Future Appointments            In 4 months [place], [place]  W, NP [facility], [place]

## 2021-05-15 ENCOUNTER — Encounter: Payer: Self-pay | Admitting: Internal Medicine

## 2021-05-15 MED ORDER — SULFAMETHOXAZOLE-TRIMETHOPRIM 400-80 MG PO TABS: 1.0000 | ORAL_TABLET | Freq: Two times a day (BID) | ORAL | 0 refills | Status: DC

## 2021-05-15 NOTE — Telephone Encounter (Signed)
Please review. Does pt need office visit? Last seen 04/23/21.  KP

## 2021-05-22 ENCOUNTER — Institutional Professional Consult (permissible substitution): Payer: PPO | Admitting: Adult Health

## 2021-06-23 ENCOUNTER — Encounter: Payer: Self-pay | Admitting: Internal Medicine

## 2021-06-30 ENCOUNTER — Telehealth: Payer: Self-pay

## 2021-06-30 ENCOUNTER — Encounter: Payer: Self-pay | Admitting: Adult Health

## 2021-06-30 ENCOUNTER — Other Ambulatory Visit: Payer: Self-pay

## 2021-06-30 ENCOUNTER — Encounter: Payer: Self-pay | Admitting: Internal Medicine

## 2021-06-30 ENCOUNTER — Ambulatory Visit: Payer: Medicare HMO | Admitting: Adult Health

## 2021-06-30 VITALS — BP 126/62 | HR 76 | Temp 98.3°F | Ht 64.0 in | Wt 226.8 lb

## 2021-06-30 DIAGNOSIS — R400 Somnolence: Secondary | ICD-10-CM | POA: Diagnosis not present

## 2021-06-30 DIAGNOSIS — G4719 Other hypersomnia: Secondary | ICD-10-CM | POA: Diagnosis not present

## 2021-06-30 DIAGNOSIS — E6609 Other obesity due to excess calories: Secondary | ICD-10-CM | POA: Insufficient documentation

## 2021-06-30 NOTE — Assessment & Plan Note (Signed)
Excessive daytime sleepiness, snoring, restless sleep, obesity with BMI at 38 all suspicious for underlying sleep apnea.  Set patient up for home sleep study  - discussed how weight can impact sleep and risk for sleep disordered breathing - discussed options to assist with weight loss: combination of diet modification, cardiovascular and strength training exercises   - had an extensive discussion regarding the adverse health consequences related to untreated sleep disordered breathing - specifically discussed the risks for hypertension, coronary artery disease, cardiac dysrhythmias, cerebrovascular disease, and diabetes - lifestyle modification discussed   - discussed how sleep disruption can increase risk of accidents, particularly when driving - safe driving practices were discussed    Plan    Patient Instructions  Set up for home sleep study .  Healthy sleep regimen  Work on healthy weight .  Do not drive if sleepy .  Follow up in 2 months to review results and go over treatment plan.

## 2021-06-30 NOTE — Telephone Encounter (Signed)
Patient has never had sleep study before nothing further needed.

## 2021-06-30 NOTE — Assessment & Plan Note (Signed)
Healthy weight loss 

## 2021-06-30 NOTE — Progress Notes (Signed)
@Patient  ID: Kristie Byrd, female    DOB: 08/13/1945, 76 y.o.   MRN: [id number]  Chief Complaint  Patient presents with   sleep consult    Referring provider: Jearld Fenton, NP  HPI: 76 year old female former smoker seen for sleep consult June 30, 2021 for daytime fatigue, snoring, daytime sleepiness and restless sleep  TEST/EVENTS :   06/30/2021 Sleep consult  Patient presents for a sleep consult.  Patient complains of ongoing fatigue, low energy, daytime sleepiness, restless sleep and snoring for several years she is concerned she may have underlying sleep apnea. Epworth score is 16.  Patient complains of significant sleepiness with sitting still, watching TV, after eating.  She has loud snoring.  Current weight is 226 pounds with a BMI at 38. She typically goes to bed at 11 PM, takes about 20 minutes to go to sleep, gets up about 530 a.m.  Is up 2-3 times each night.  Weight is up about 50 pounds over the last 2 years.  Has never had a sleep study in the past. Caffeine intake is 3 glasses of tea.  No suspicious symptoms for cataplexy or sleep paralysis Works partime - takes at least 1 nap daily and 2 times on the weekend.  Screen time is 3hr each day.   Social history-patient is single,/divorced has 2 children, lives alone.  Works as an Glass blower/designer.part time. ,  Former smoker 20 years ago.  Negative alcohol Walks with walker. Limited due to back issues.   Family history-unknown -orphan .   Surgical history-nasal surgery , right total knee replacement .   Past medical history significant for hypertension, allergic rhinitis , Hypertension, Chronic kidney disease-stage III. , Anemia of chronic kidney disease. , essential tremor. , COPD , Vertebral compression fracture-chronic back pain . Sleeps in recliner . Depression  No hx of stroke or chf. Not on oxygen .   Allergies  Allergen Reactions   Codeine Nausea And Vomiting   Demerol [Meperidine] Nausea And Vomiting    Fleet Phospho-Soda [Sodium Phosphates] Nausea And Vomiting    Vomiting    Latex Rash    Blisters   Other Other (See Comments)    Band-Aid, blister   Prednisone Other (See Comments)    Chest pressure    Immunization History  Administered Date(s) Administered   Fluad Quad(high Dose 65+) 03/21/2019, 03/31/2020   Influenza Split 02/15/2013   Influenza, High Dose Seasonal PF 02/02/2014, 01/21/2015, 01/25/2017, 07/15/2018   Influenza, Seasonal, Injecte, Preservative Fre 01/02/2016   Influenza-Unspecified 01/25/2017, 04/17/2021   PFIZER(Purple Top)SARS-COV-2 Vaccination 06/29/2019, 07/20/2019   Pneumococcal Conjugate-13 02/02/2014   Pneumococcal Polysaccharide-23 01/25/2017   Pneumococcal-Unspecified 02/19/2013   Td 11/05/2013   Tdap 02/20/2015   Zoster Recombinat (Shingrix) 01/25/2017   Zoster, Live 12/27/2012    Past Medical History:  Diagnosis Date   Arthritis    Cancer ([place])    cervix, skin   Colon polyps    COPD (chronic obstructive pulmonary disease) (HCC)    Depression    GERD (gastroesophageal reflux disease)    Headache    Hypertension    Iron deficiency anemia 09/27/2012   PONV (postoperative nausea and vomiting)    Shingles     Tobacco History: Social History   Tobacco Use  Smoking Status Former   Packs/day: 1.00   Years: 37.00   Pack years: 37.00   Types: Cigarettes   Quit date: 11/15/1996   Years since quitting: 24.6  Smokeless Tobacco Never   Counseling given: Not  Answered   Outpatient Medications Prior to Visit  Medication Sig Dispense Refill   acetaminophen (TYLENOL) 650 MG CR tablet Take 1,000 mg by mouth every 4 (four) hours as needed.     buPROPion (WELLBUTRIN XL) 300 MG 24 hr tablet TAKE 1 TABLET BY MOUTH ONCE DAILY 90 tablet 0   Cholecalciferol (VITAMIN D3) 1000 units CAPS Take 1,000 Units by mouth daily.     fluticasone (FLONASE) 50 MCG/ACT nasal spray Place 2 sprays into both nostrils daily.     lisinopril (ZESTRIL) 5 MG tablet Take 1  tablet (5 mg total) by mouth daily. 90 tablet 0   Multiple Vitamin (MULTIVITAMIN) tablet Take 1 tablet by mouth at bedtime.      nitrofurantoin, macrocrystal-monohydrate, (MACROBID) 100 MG capsule Take 1 capsule (100 mg total) by mouth 2 (two) times daily. 10 capsule 0   propranolol (INDERAL) 40 MG tablet TAKE 1 TABLET BY MOUTH TWICE DAILY 180 tablet 3   sertraline (ZOLOFT) 100 MG tablet TAKE 1 TABLET BY MOUTH ONCE DAILY 90 tablet 0   ferric citrate (AURYXIA) 1 GM 210 MG(Fe) tablet Take 2 tablets (420 mg total) by mouth 2 (two) times daily with a meal. 120 tablet 5   NON FORMULARY HEMP OIL: DAILY     sulfamethoxazole-trimethoprim (BACTRIM) 400-80 MG tablet Take 1 tablet by mouth 2 (two) times daily. 10 tablet 0   Turmeric 450 MG CAPS Take 1 capsule by mouth daily.     calcitRIOL (ROCALTROL) 0.25 MCG capsule Take 0.25 mcg by mouth daily. (Patient not taking: Reported on 06/30/2021)     No facility-administered medications prior to visit.     Review of Systems:   Constitutional:   No  weight loss, night sweats,  Fevers, chills,  +fatigue, or  lassitude.  HEENT:   No headaches,  Difficulty swallowing,  Tooth/dental problems, or  Sore throat,                No sneezing, itching, ear ache, nasal congestion, post nasal drip,   CV:  No chest pain,  Orthopnea, PND, swelling in lower extremities, anasarca, dizziness, palpitations, syncope.   GI  No heartburn, indigestion, abdominal pain, nausea, vomiting, diarrhea, change in bowel habits, loss of appetite, bloody stools.   Resp: No shortness of breath with exertion or at rest.  No excess mucus, no productive cough,  No non-productive cough,  No coughing up of blood.  No change in color of mucus.  No wheezing.  No chest wall deformity  Skin: no rash or lesions.  GU: no dysuria, change in color of urine, no urgency or frequency.  No flank pain, no hematuria   MS:  No joint pain or swelling.  No decreased range of motion.  + back  pain.    Physical Exam  BP 126/62 (BP Location: Left Arm, Cuff Size: Large)    Pulse 76    Temp 98.3 F (36.8 C) (Temporal)    Ht 5\' 4"  (1.626 m)    Wt 226 lb 12.8 oz (102.9 kg)    SpO2 95%    BMI 38.93 kg/m   GEN: A/Ox3; pleasant , NAD, well nourished , walker    HEENT:  [place]/AT,  NOSE-clear, THROAT-clear, no lesions, no postnasal drip or exudate noted. Class 2 MP airway   NECK:  Supple w/ fair ROM; no JVD; normal carotid impulses w/o bruits; no thyromegaly or nodules palpated; no lymphadenopathy.    RESP  Clear  P & A; w/o, wheezes/ rales/ or  rhonchi. no accessory muscle use, no dullness to percussion  CARD:  RRR, no m/r/g, no peripheral edema, pulses intact, no cyanosis or clubbing.  GI:   Soft & nt; nml bowel sounds; no organomegaly or masses detected.   Musco: Warm bil, no deformities or joint swelling noted.   Neuro: alert, no focal deficits noted.    Skin: Warm, no lesions or rashes    Lab Results:  CBC     BNP No results found for: BNP  ProBNP No results found for: PROBNP  Imaging: No results found.    No flowsheet data found.  No results found for: NITRICOXIDE      Assessment & Plan:   Daytime sleepiness Excessive daytime sleepiness, snoring, restless sleep, obesity with BMI at 38 all suspicious for underlying sleep apnea.  Set patient up for home sleep study  - discussed how weight can impact sleep and risk for sleep disordered breathing - discussed options to assist with weight loss: combination of diet modification, cardiovascular and strength training exercises   - had an extensive discussion regarding the adverse health consequences related to untreated sleep disordered breathing - specifically discussed the risks for hypertension, coronary artery disease, cardiac dysrhythmias, cerebrovascular disease, and diabetes - lifestyle modification discussed   - discussed how sleep disruption can increase risk of accidents, particularly when  driving - safe driving practices were discussed    Plan    Patient Instructions  Set up for home sleep study .  Healthy sleep regimen  Work on healthy weight .  Do not drive if sleepy .  Follow up in 2 months to review results and go over treatment plan.        Morbid obesity ([place]) Healthy weight loss      Rexene Edison, NP 06/30/2021

## 2021-06-30 NOTE — Patient Instructions (Addendum)
Set up for home sleep study .  Healthy sleep regimen  Work on healthy weight .  Do not drive if sleepy .  Follow up in 2 months to review results and go over treatment plan.

## 2021-07-01 NOTE — Progress Notes (Signed)
Reviewed and agree with assessment/plan.   Chesley Mires, MD [facility] Pulmonary/Critical Care 07/01/2021, 8:48 AM Pager:  [phone]

## 2021-07-24 ENCOUNTER — Other Ambulatory Visit: Payer: Self-pay

## 2021-07-24 DIAGNOSIS — F419 Anxiety disorder, unspecified: Secondary | ICD-10-CM

## 2021-07-24 DIAGNOSIS — R251 Tremor, unspecified: Secondary | ICD-10-CM

## 2021-07-24 DIAGNOSIS — I10 Essential (primary) hypertension: Secondary | ICD-10-CM

## 2021-07-24 DIAGNOSIS — F32A Depression, unspecified: Secondary | ICD-10-CM

## 2021-07-24 MED ORDER — BUPROPION HCL ER (XL) 300 MG PO TB24: 300.0000 mg | ORAL_TABLET | Freq: Every day | ORAL | 1 refills | Status: DC

## 2021-07-24 MED ORDER — PROPRANOLOL HCL 40 MG PO TABS: 40.0000 mg | ORAL_TABLET | Freq: Two times a day (BID) | ORAL | 1 refills | Status: DC

## 2021-07-24 MED ORDER — SERTRALINE HCL 100 MG PO TABS: 100.0000 mg | ORAL_TABLET | Freq: Every day | ORAL | 1 refills | Status: DC

## 2021-07-24 MED ORDER — LISINOPRIL 5 MG PO TABS: 5.0000 mg | ORAL_TABLET | Freq: Every day | ORAL | 1 refills | Status: DC

## 2021-07-31 ENCOUNTER — Other Ambulatory Visit: Payer: Self-pay

## 2021-07-31 ENCOUNTER — Ambulatory Visit: Payer: Medicare HMO

## 2021-07-31 DIAGNOSIS — G4719 Other hypersomnia: Secondary | ICD-10-CM

## 2021-07-31 DIAGNOSIS — G4733 Obstructive sleep apnea (adult) (pediatric): Secondary | ICD-10-CM | POA: Diagnosis not present

## 2021-08-04 DIAGNOSIS — G4733 Obstructive sleep apnea (adult) (pediatric): Secondary | ICD-10-CM | POA: Diagnosis not present

## 2021-08-07 ENCOUNTER — Other Ambulatory Visit: Payer: Self-pay

## 2021-08-11 ENCOUNTER — Other Ambulatory Visit: Payer: Self-pay | Admitting: Internal Medicine

## 2021-08-11 NOTE — Telephone Encounter (Signed)
Medication Refill - Medication: 90 day supply calcitRIOL (ROCALTROL) 0.25 MCG capsule ? ?Has the patient contacted their pharmacy? Yes.   ? ? ?(Agent: If yes, when and what did the pharmacy advise?) Pharmacy faxed over request on 08/04/2021. Patient is new to using this pharmacy therefore medication is not in pharmacy profile ? ?Preferred Pharmacy (with phone number or street name):  ?[place], [place]. Phone:  [phone]  ?Fax:  [phone]  ?  ? ? ?Has the patient been seen for an appointment in the last year OR does the patient have an upcoming appointment? Yes.   ? ?Agent: Please be advised that RX refills may take up to 3 business days. We ask that you follow-up with your pharmacy. ?

## 2021-08-12 ENCOUNTER — Other Ambulatory Visit: Payer: Self-pay

## 2021-08-13 NOTE — Telephone Encounter (Signed)
Requested medication (s) are due for refill today:   Not sure ? ?Requested medication (s) are on the active medication list:   Yes as a historical medication ? ?Future visit scheduled:   Yes ? ? ?Last ordered: 06/30/2021 it's reported that she is not taking this. ? ?Returned because last prescribed 2 yrs ago by a historical provider, unable to fill per protocol.  ? ?Requested Prescriptions  ?Pending Prescriptions Disp Refills  ? calcitRIOL (ROCALTROL) 0.25 MCG capsule    ?  Sig: Take 1 capsule (0.25 mcg total) by mouth daily.  ?  ? Endocrinology:  Vitamins - Vitamin D Supplementation - calcitriol Failed - 08/11/2021  6:06 PM  ?  ?  Failed - Phosphate in normal range and within 360 days  ?  No results found for: PHOS  ?  ?  ?  Passed - PTH in normal range and within 360 days  ?  PTH Interp  ?Date Value Ref Range Status  ?04/06/2021 Comment  Final  ?  Comment:  ?  (NOTE) ?Interpretation                 Intact PTH    Calcium ?                               (pg/mL)      (mg/dL) ?Normal                          15 - 65     8.6 - 10.2 ?Primary Hyperparathyroidism         >65          >10.2 ?Secondary Hyperparathyroidism       >65          <10.2 ?Non-Parathyroid Hypercalcemia       <65          >10.2 ?Hypoparathyroidism                  <15          < 8.6 ?Non-Parathyroid Hypocalcemia    15 - 65          < 8.6 ?Performed At: [facility] ?[address] [place], [place] [id number] ?Rush Farmer MD [id number] ?  ? ?PTH  ?Date Value Ref Range Status  ?04/06/2021 38 15 - 65 pg/mL Final  ?  ?  ?  ?  Passed - Ca in normal range and within 360 days  ?  Calcium  ?Date Value Ref Range Status  ?04/06/2021 9.1 8.9 - 10.3 mg/dL Final  ? ?Calcium, Total (PTH)  ?Date Value Ref Range Status  ?04/06/2021 9.7 8.7 - 10.3 mg/dL Final  ?  ?  ?  ?  Passed - Valid encounter within last 12 months  ?  Recent Outpatient Visits   ? ?      ? 3 months ago Burning with urination  ? [facility] [place], [place] W, NP  ? 5  months ago COVID-19  ? [facility] [place], Coralie Keens, NP  ? 11 months ago Medicare annual wellness visit, subsequent  ? [facility] [place], Coralie Keens, NP  ? ?  ?  ?Future Appointments   ? ?        ? In 1 month Baity, Coralie Keens, NP [facility], [place]  ? ?  ? ?  ?  ?  ? ?

## 2021-08-14 NOTE — Telephone Encounter (Signed)
Pt recently reported she wasn't taking this. Does she need this refilled? ?

## 2021-08-14 NOTE — Telephone Encounter (Signed)
Pt states this was suppose to go to Dr. Holley Raring.  He has already refilled it.  ? ?Thanks,  ? ?-Mickel Baas  ?

## 2021-08-19 ENCOUNTER — Other Ambulatory Visit: Payer: Self-pay

## 2021-08-20 ENCOUNTER — Encounter: Payer: Self-pay | Admitting: Adult Health

## 2021-08-20 ENCOUNTER — Telehealth (INDEPENDENT_AMBULATORY_CARE_PROVIDER_SITE_OTHER): Payer: Medicare HMO | Admitting: Adult Health

## 2021-08-20 DIAGNOSIS — G4733 Obstructive sleep apnea (adult) (pediatric): Secondary | ICD-10-CM

## 2021-08-20 NOTE — Patient Instructions (Addendum)
Begin CPAP at bedtime goal was to wear all night long. ?Work on healthy weight loss ?Healthy sleep regimen ?Do not drive if sleepy ?Follow-up in 2 to 3 months with Dr. Halford Chessman or [place] office  and As needed   ? ?

## 2021-08-20 NOTE — Progress Notes (Signed)
Reviewed and agree with assessment/plan. ? ? ?Chesley Mires, MD ?[place] ?08/20/2021, 11:27 AM ?Pager:  [phone] ? ?

## 2021-08-20 NOTE — Progress Notes (Signed)
Virtual Visit via Video Note ? ?I connected with [name] on 08/20/21 at  9:30 AM EDT by a video enabled telemedicine application and verified that I am speaking with the correct person using two identifiers. ? ?Location: ?Patient: Home  ?Provider: Home  ?  ?I discussed the limitations of evaluation and management by telemedicine and the availability of in person appointments. The patient expressed understanding and agreed to proceed. ? ?History of Present Illness: ?76 year old female seen for sleep consult June 30, 2021 for daytime sleepiness snoring and restless sleep.  She was set up for home sleep study that was completed on August 02, 2021 that showed moderate sleep apnea with AHI 24.5/hour and SPO2 low at 75%.  We discussed her sleep study results and went over treatment options including weight loss, oral appliance and CPAP.  Patient has significant symptom burden.  And significant number of events.  She will proceed with a CPAP therapy.  Patient education was given on CPAP and CPAP care. ? ?Past Medical History:  ?Diagnosis Date  ? Arthritis   ? Cancer [facility])   ? cervix, skin  ? Colon polyps   ? COPD (chronic obstructive pulmonary disease) ([place])   ? Depression   ? GERD (gastroesophageal reflux disease)   ? Headache   ? Hypertension   ? Iron deficiency anemia 09/27/2012  ? PONV (postoperative nausea and vomiting)   ? Shingles   ?  ?. ?Current Outpatient Medications on File Prior to Visit  ?Medication Sig Dispense Refill  ? acetaminophen (TYLENOL) 650 MG CR tablet Take 1,000 mg by mouth every 4 (four) hours as needed.    ? buPROPion (WELLBUTRIN XL) 300 MG 24 hr tablet Take 1 tablet (300 mg total) by mouth daily. 90 tablet 1  ? calcitRIOL (ROCALTROL) 0.25 MCG capsule Take 0.25 mcg by mouth daily.    ? Cholecalciferol (VITAMIN D3) 1000 units CAPS Take 1,000 Units by mouth daily.    ? fluticasone (FLONASE) 50 MCG/ACT nasal spray Place 2 sprays into both nostrils daily.    ? lisinopril (ZESTRIL) 5 MG tablet  Take 1 tablet (5 mg total) by mouth daily. 90 tablet 1  ? Multiple Vitamin (MULTIVITAMIN) tablet Take 1 tablet by mouth at bedtime.     ? propranolol (INDERAL) 40 MG tablet Take 1 tablet (40 mg total) by mouth 2 (two) times daily. 180 tablet 1  ? sertraline (ZOLOFT) 100 MG tablet Take 1 tablet (100 mg total) by mouth daily. 90 tablet 1  ? ?No current facility-administered medications on file prior to visit.  ?  ? ?  ?Observations/Objective: ?Appears well without any apparent distress ? ?Assessment and Plan: ?Moderate obstructive sleep apnea-patient has multiple comorbidities (hypertension, chronic kidney disease, COPD) along with significant symptom burden and significant number of events with nocturnal hypoxemia on sleep study.  She will proceed with CPAP therapy.  Patient education was given. ?We will begin CPAP AutoSet 5 to 15 cm H2O.  Mask of choice  ? ?Hypertension -continue follow-up with primary care ? ? ?Plan  ?Patient Instructions  ?Begin CPAP at bedtime goal was to wear all night long. ?Work on healthy weight loss ?Healthy sleep regimen ?Do not drive if sleepy ?Follow-up in 2 to 3 months with Dr. Halford Chessman or [place] office  and As needed   ?  ? ? ? ? ?Follow Up Instructions: ? ?  ?I discussed the assessment and treatment plan with the patient. The patient was provided an opportunity to ask questions  and all were answered. The patient agreed with the plan and demonstrated an understanding of the instructions. ?  ?The patient was advised to call back or seek an in-person evaluation if the symptoms worsen or if the condition fails to improve as anticipated. ? ?I provided 22 minutes of non-face-to-face time during this encounter. ? ? ?Rexene Edison, NP ? ?

## 2021-09-01 ENCOUNTER — Telehealth: Payer: Self-pay | Admitting: Adult Health

## 2021-09-01 ENCOUNTER — Telehealth: Payer: Self-pay

## 2021-09-01 DIAGNOSIS — G4733 Obstructive sleep apnea (adult) (pediatric): Secondary | ICD-10-CM

## 2021-09-01 NOTE — Telephone Encounter (Signed)
Copied from [place] [id number]. Topic: General - Other ?>> Sep 01, 2021  3:19 PM Tessa Lerner A wrote: ?Reason for CRM: The pulmonologist office to contact their PCP ? ?The patient would like to review the information from their sleep study as well as order a CPAP machine  ? ?Please contact further when possible to discuss ?

## 2021-09-01 NOTE — Telephone Encounter (Signed)
CPAP autoset 5-15 cmH2O .  ?

## 2021-09-01 NOTE — Telephone Encounter (Signed)
Spoke to patient.  ?She is requesting a update on cpap. According to our records, it does not appear that cpap was ordered, however it is mentioned in AVS but settings are not listed. ? ?Tammy, please advise on settings. Thanks ?

## 2021-09-01 NOTE — Telephone Encounter (Signed)
Pt states the pulmonologist has resolved the issue. ? ?Thanks,  ? ?-Mickel Baas  ?

## 2021-09-02 NOTE — Telephone Encounter (Signed)
Lm for patient.  

## 2021-09-02 NOTE — Addendum Note (Signed)
Addended by: Claudette Head A on: 09/02/2021 10:01 AM ? ? Modules accepted: Orders ? ?

## 2021-09-02 NOTE — Telephone Encounter (Signed)
Spoke to patient and relayed below message/recommendations. Order placed for cpap. ?Patient will call back to schedule ROV once setup on machine.  ?Nothing further needed.  ? ?

## 2021-09-22 ENCOUNTER — Encounter: Payer: PPO | Admitting: Internal Medicine

## 2021-09-24 ENCOUNTER — Telehealth: Payer: Self-pay | Admitting: Adult Health

## 2021-09-24 ENCOUNTER — Encounter: Payer: Self-pay | Admitting: Internal Medicine

## 2021-09-24 ENCOUNTER — Ambulatory Visit (INDEPENDENT_AMBULATORY_CARE_PROVIDER_SITE_OTHER): Payer: Medicare HMO | Admitting: Internal Medicine

## 2021-09-24 ENCOUNTER — Ambulatory Visit (INDEPENDENT_AMBULATORY_CARE_PROVIDER_SITE_OTHER): Payer: Medicare HMO

## 2021-09-24 VITALS — BP 134/80 | HR 69 | Temp 96.9°F | Resp 17 | Ht 64.0 in | Wt 230.0 lb

## 2021-09-24 VITALS — BP 134/80 | HR 69 | Temp 96.9°F | Ht 64.0 in | Wt 230.0 lb

## 2021-09-24 DIAGNOSIS — Z0000 Encounter for general adult medical examination without abnormal findings: Secondary | ICD-10-CM | POA: Diagnosis not present

## 2021-09-24 DIAGNOSIS — Z6839 Body mass index (BMI) 39.0-39.9, adult: Secondary | ICD-10-CM | POA: Diagnosis not present

## 2021-09-24 DIAGNOSIS — J439 Emphysema, unspecified: Secondary | ICD-10-CM

## 2021-09-24 DIAGNOSIS — Z0001 Encounter for general adult medical examination with abnormal findings: Secondary | ICD-10-CM | POA: Diagnosis not present

## 2021-09-24 DIAGNOSIS — E66812 Obesity, class 2: Secondary | ICD-10-CM

## 2021-09-24 DIAGNOSIS — Z1231 Encounter for screening mammogram for malignant neoplasm of breast: Secondary | ICD-10-CM

## 2021-09-24 DIAGNOSIS — N390 Urinary tract infection, site not specified: Secondary | ICD-10-CM | POA: Diagnosis not present

## 2021-09-24 DIAGNOSIS — E785 Hyperlipidemia, unspecified: Secondary | ICD-10-CM | POA: Diagnosis not present

## 2021-09-24 DIAGNOSIS — R7309 Other abnormal glucose: Secondary | ICD-10-CM | POA: Diagnosis not present

## 2021-09-24 DIAGNOSIS — D649 Anemia, unspecified: Secondary | ICD-10-CM | POA: Diagnosis not present

## 2021-09-24 MED ORDER — BREZTRI AEROSPHERE 160-9-4.8 MCG/ACT IN AERO: 2.0000 | INHALATION_SPRAY | Freq: Two times a day (BID) | RESPIRATORY_TRACT | 11 refills | Status: DC

## 2021-09-24 MED ORDER — ALBUTEROL SULFATE HFA 108 (90 BASE) MCG/ACT IN AERS: 2.0000 | INHALATION_SPRAY | Freq: Four times a day (QID) | RESPIRATORY_TRACT | 0 refills | Status: DC | PRN

## 2021-09-24 NOTE — Progress Notes (Signed)
? ?Subjective:  ? Kristie Byrd is a 76 y.o. female who presents for Medicare Annual (Subsequent) preventive examination. ? ?Review of Systems    ?Per HPI unless specifically indicated below ? ?Cardiac Risk Factors include: advanced age (>61mn, >>59women);sedentary lifestyle ? ?   ?Objective:  ?  ?Today's Vitals  ? 09/24/21 1457  ?BP: 134/80  ?Pulse: 69  ?Resp: 17  ?Temp: (!) 96.9 ?F (36.1 ?C)  ?TempSrc: Temporal  ?SpO2: 98%  ?Weight: 230 lb (104.3 kg)  ?Height: '5\' 4"'$  (1.626 m)  ?PainSc: 5   ?PainLoc: Back  ? ?Body mass index is 39.48 kg/m?. ? ? ?  04/02/2020  ?  1:17 PM 10/03/2019  ?  1:54 PM 04/03/2019  ?  2:10 PM 10/04/2018  ? 11:01 AM 08/28/2018  ?  2:21 PM 08/22/2017  ?  2:40 PM 11/30/2016  ?  1:48 PM  ?Advanced Directives  ?Does Patient Have a Medical Advance Directive?  No No No No No No  ?Would patient like information on creating a medical advance directive? No - Patient declined No - Patient declined No - Patient declined No - Patient declined Yes (MAU/Ambulatory/Procedural Areas - Information given) No - Patient declined   ? ? ?Current Medications (verified) ?Outpatient Encounter Medications as of 09/24/2021  ?Medication Sig  ? acetaminophen (TYLENOL) 650 MG CR tablet Take 1,000 mg by mouth every 4 (four) hours as needed.  ? albuterol (VENTOLIN HFA) 108 (90 Base) MCG/ACT inhaler Inhale 2 puffs into the lungs every 6 (six) hours as needed for wheezing or shortness of breath.  ? Budeson-Glycopyrrol-Formoterol (BREZTRI AEROSPHERE) 160-9-4.8 MCG/ACT AERO Inhale 2 puffs into the lungs 2 (two) times daily.  ? buPROPion (WELLBUTRIN XL) 300 MG 24 hr tablet Take 1 tablet (300 mg total) by mouth daily.  ? calcitRIOL (ROCALTROL) 0.25 MCG capsule Take 0.25 mcg by mouth daily.  ? Cholecalciferol (VITAMIN D3) 1000 units CAPS Take 1,000 Units by mouth daily.  ? fluticasone (FLONASE) 50 MCG/ACT nasal spray Place 2 sprays into both nostrils daily.  ? lisinopril (ZESTRIL) 5 MG tablet Take 1 tablet (5 mg total) by mouth daily.   ? Multiple Vitamin (MULTIVITAMIN) tablet Take 1 tablet by mouth at bedtime.   ? propranolol (INDERAL) 40 MG tablet Take 1 tablet (40 mg total) by mouth 2 (two) times daily.  ? sertraline (ZOLOFT) 100 MG tablet Take 1 tablet (100 mg total) by mouth daily.  ? ?No facility-administered encounter medications on file as of 09/24/2021.  ? ? ?Allergies (verified) ?Codeine, Demerol [meperidine], Fleet phospho-soda [sodium phosphates], Latex, Other, and Prednisone  ? ?History: ?Past Medical History:  ?Diagnosis Date  ? Arthritis   ? Cancer ([facility]   ? cervix, skin  ? Colon polyps   ? [place]   ? Depression   ? GERD (gastroesophageal reflux disease)   ? Headache   ? Hypertension   ? Iron deficiency anemia 09/27/2012  ? PONV (postoperative nausea and vomiting)   ? Shingles   ? ?Past Surgical History:  ?Procedure Laterality Date  ? BREAST BIOPSY Bilateral   ? neg  ? CARPAL TUNNEL RELEASE Bilateral   ? COLONOSCOPY  multiple  ? KNEE ARTHROPLASTY Right 05/12/2016  ? Procedure: COMPUTER ASSISTED TOTAL KNEE ARTHROPLASTY;  Surgeon: JDereck Leep MD;  Location: [facility] ORS;  Service: Orthopedics;  Laterality: Right;  ? KNEE SURGERY Right   ? NOSE SURGERY    ? TONSILLECTOMY    ? ?Family History  ?Family history unknown:  Yes  ? ?Social History  ? ?Socioeconomic History  ? Marital status: Divorced  ?  Spouse name: Not on file  ? Number of children: 2  ? Years of education: Not on file  ? Highest education level: Not on file  ?Occupational History  ? Occupation: retired  ?Tobacco Use  ? Smoking status: Former  ?  Packs/day: 1.00  ?  Years: 37.00  ?  Pack years: 37.00  ?  Types: Cigarettes  ?  Quit date: 11/15/1996  ?  Years since quitting: 24.8  ? Smokeless tobacco: Never  ?Vaping Use  ? Vaping Use: Never used  ?Substance and Sexual Activity  ? Alcohol use: No  ?  Alcohol/week: 0.0 standard drinks  ? Drug use: No  ?  Comment: uses CBD oil   ? Sexual activity: Never  ?Other Topics Concern  ? Not on file   ?Social History Narrative  ? Lives alone.  Divorced. Two daughters- in 4s, 3 grandchildren.  ? Does not know family history- raised in [facility].    ?   ? Desires CPR.  Does not have HPOA.  ? She would possibly want life support if for short period of time . Unsure about feeding tube.  ? ?Social Determinants of Health  ? ?Financial Resource Strain: Low Risk   ? Difficulty of Paying Living Expenses: Not very hard  ?Food Insecurity: No Food Insecurity  ? Worried About Charity fundraiser in the Last Year: Never true  ? Ran Out of Food in the Last Year: Never true  ?Transportation Needs: No Transportation Needs  ? Lack of Transportation (Medical): No  ? Lack of Transportation (Non-Medical): No  ?Physical Activity: Inactive  ? Days of Exercise per Week: 0 days  ? Minutes of Exercise per Session: 0 min  ?Stress: No Stress Concern Present  ? Feeling of Stress : Not at all  ?Social Connections: Socially Isolated  ? Frequency of Communication with Friends and Family: More than three times a week  ? Frequency of Social Gatherings with Friends and Family: Never  ? Attends Religious Services: Never  ? Active Member of Clubs or Organizations: No  ? Attends Archivist Meetings: Never  ? Marital Status: Divorced  ? ? ?Tobacco Counseling ?Counseling given: Not Answered ? ? ?Clinical Intake: ? ?Pre-visit preparation completed: No ? ?Pain : 0-10 ?Pain Score: 5  ?Pain Type: Chronic pain ?Pain Location: Back ?Pain Descriptors / Indicators: Constant, Aching ?Pain Onset: Other (comment) ?Pain Frequency: Constant ? ?  ? ?Nutritional Status: BMI > 30  Obese ?Nutritional Risks: None ? ?How often do you need to have someone help you when you read instructions, pamphlets, or other written materials from your doctor or pharmacy?: 1 - Never ? ?Diabetic? No ? ?  ? ?Information entered by :: Donnie Mesa, CMA ? ? ?Activities of Daily Living ? ?  09/24/2021  ?  3:07 PM  ?In your present state of health, do you have any  difficulty performing the following activities:  ?Hearing? 1  ?Comment rIGHT EAR  ?Vision? 1  ?Difficulty concentrating or making decisions? 0  ?Walking or climbing stairs? 1  ?Dressing or bathing? 0  ?Doing errands, shopping? 0  ?Preparing Food and eating ? N  ?Using the Toilet? N  ?In the past six months, have you accidently leaked urine? N  ?Do you have problems with loss of bowel control? N  ?Managing your Medications? N  ?Managing your Finances? N  ?Housekeeping  or managing your Housekeeping? Y  ? ? ?Patient Care Team: ?Jearld Fenton, NP as PCP - General (Internal Medicine) ?Juanito Doom, MD as Consulting Physician (Pulmonary Disease) ?Forest Gleason, MD as Consulting Physician (Oncology) ?Gatha Mayer, MD as Consulting Physician (Gastroenterology) ?Leandrew Koyanagi, MD as Referring Physician (Ophthalmology) ? ?Indicate any recent Medical Services you may have received from other than [facility] providers in the past year (date may be approximate). ? ?   ?Assessment:  ? This is a routine wellness examination for Aprill. ? ?Hearing/Vision screen ?No results found. ? ?Dietary issues and exercise activities discussed: ?Current Exercise Habits: The patient does not participate in regular exercise at present, Exercise limited by: orthopedic condition(s) ? ?She does eat some meat. She eats more veggies than fruits. She tries to avoid fried food. She drinks mostly tea, water and coffee. ? ? ? Goals Addressed   ? ?  ?  ?  ?  ? This Visit's Progress  ?  Home and Family Safety Maintained     ?  Evidence-based guidance:  ?Identify and review with patient potential safety risks from home or living environment, automobile driving and/or riding, as well as firearms.   ?Provide anticipatory guidance related to specific risks to safety; brainstorm acceptable strategies to reduce risk.  ?Identify resources needed to improve or maintain safety.  ?Provide emergency services contact information; encourage placement of  contact information in easy to locate place at home and in wallet, purse or backpack.   ?Notes:  ?  ? ?  ? ?Depression Screen ? ?  09/16/2020  ?  2:13 PM 03/31/2020  ? 12:21 PM 08/28/2018  ?  2:20 PM 7/17/2

## 2021-09-24 NOTE — Telephone Encounter (Signed)
Urgent message sent to [employer] asking them to check on this issue 

## 2021-09-24 NOTE — Assessment & Plan Note (Signed)
Encouraged diet and exercise for weight loss ?

## 2021-09-24 NOTE — Patient Instructions (Signed)

## 2021-09-24 NOTE — Patient Instructions (Signed)
Fall Prevention in the Home, Adult ?Falls can cause injuries and can happen to people of all ages. There are many things you can do to make your home safe and to help prevent falls. Ask for help when making these changes. ?What actions can I take to prevent falls? ?General Instructions ?Use good lighting in all rooms. Replace any light bulbs that burn out. ?Turn on the lights in dark areas. Use night-lights. ?Keep items that you use often in easy-to-reach places. Lower the shelves around your home if needed. ?Set up your furniture so you have a clear path. Avoid moving your furniture around. ?Do not have throw rugs or other things on the floor that can make you trip. ?Avoid walking on wet floors. ?If any of your floors are uneven, fix them. ?Add color or contrast paint or tape to clearly mark and help you see: ?Grab bars or handrails. ?First and last steps of staircases. ?Where the edge of each step is. ?If you use a stepladder: ?Make sure that it is fully opened. Do not climb a closed stepladder. ?Make sure the sides of the stepladder are locked in place. ?Ask someone to hold the stepladder while you use it. ?Know where your pets are when moving through your home. ?What can I do in the bathroom? ? ?  ? ?Keep the floor dry. Clean up any water on the floor right away. ?Remove soap buildup in the tub or shower. ?Use nonskid mats or decals on the floor of the tub or shower. ?Attach bath mats securely with double-sided, nonslip rug tape. ?If you need to sit down in the shower, use a plastic, nonslip stool. ?Install grab bars by the toilet and in the tub and shower. Do not use towel bars as grab bars. ?What can I do in the bedroom? ?Make sure that you have a light by your bed that is easy to reach. ?Do not use any sheets or blankets for your bed that hang to the floor. ?Have a firm chair with side arms that you can use for support when you get dressed. ?What can I do in the kitchen? ?Clean up any spills right away. ?If  you need to reach something above you, use a step stool with a grab bar. ?Keep electrical cords out of the way. ?Do not use floor polish or wax that makes floors slippery. ?What can I do with my stairs? ?Do not leave any items on the stairs. ?Make sure that you have a light switch at the top and the bottom of the stairs. ?Make sure that there are handrails on both sides of the stairs. Fix handrails that are broken or loose. ?Install nonslip stair treads on all your stairs. ?Avoid having throw rugs at the top or bottom of the stairs. ?Choose a carpet that does not hide the edge of the steps on the stairs. ?Check carpeting to make sure that it is firmly attached to the stairs. Fix carpet that is loose or worn. ?What can I do on the outside of my home? ?Use bright outdoor lighting. ?Fix the edges of walkways and driveways and fix any cracks. ?Remove anything that might make you trip as you walk through a door, such as a raised step or threshold. ?Trim any bushes or trees on paths to your home. ?Check to see if handrails are loose or broken and that both sides of all steps have handrails. ?Install guardrails along the edges of any raised decks and porches. ?Clear paths of anything  that can make you trip, such as tools or rocks. ?Have leaves, snow, or ice cleared regularly. ?Use sand or salt on paths during winter. ?Clean up any spills in your garage right away. This includes grease or oil spills. ?What other actions can I take? ?Wear shoes that: ?Have a low heel. Do not wear high heels. ?Have rubber bottoms. ?Feel good on your feet and fit well. ?Are closed at the toe. Do not wear open-toe sandals. ?Use tools that help you move around if needed. These include: ?Canes. ?Walkers. ?Scooters. ?Crutches. ?Review your medicines with your doctor. Some medicines can make you feel dizzy. This can increase your chance of falling. ?Ask your doctor what else you can do to help prevent falls. ?Where to find more information ?Centers  for Disease Control and Prevention, [employer]: http://www.wolf.info/ ?[employer] on Aging: http://kim-miller.com/ ?Contact a doctor if: ?You are afraid of falling at home. ?You feel weak, drowsy, or dizzy at home. ?You fall at home. ?Summary ?There are many simple things that you can do to make your home safe and to help prevent falls. ?Ways to make your home safe include removing things that can make you trip and installing grab bars in the bathroom. ?Ask for help when making these changes in your home. ?This information is not intended to replace advice given to you by your health care provider. Make sure you discuss any questions you have with your health care provider. ?Document Revised: 02/02/2021 Document Reviewed: 12/05/2019 ?Elsevier Patient Education ? [place]. ? ?Health Maintenance, Female ?Adopting a healthy lifestyle and getting preventive care are important in promoting health and wellness. Ask your health care provider about: ?The right schedule for you to have regular tests and exams. ?Things you can do on your own to prevent diseases and keep yourself healthy. ?What should I know about diet, weight, and exercise? ?Eat a healthy diet ? ?Eat a diet that includes plenty of vegetables, fruits, low-fat dairy products, and lean protein. ?Do not eat a lot of foods that are high in solid fats, added sugars, or sodium. ?Maintain a healthy weight ?Body mass index (BMI) is used to identify weight problems. It estimates body fat based on height and weight. Your health care provider can help determine your BMI and help you achieve or maintain a healthy weight. ?Get regular exercise ?Get regular exercise. This is one of the most important things you can do for your health. Most adults should: ?Exercise for at least 150 minutes each week. The exercise should increase your heart rate and make you sweat (moderate-intensity exercise). ?Do strengthening exercises at least twice a week. This is in addition to the  moderate-intensity exercise. ?Spend less time sitting. Even light physical activity can be beneficial. ?Watch cholesterol and blood lipids ?Have your blood tested for lipids and cholesterol at 75 years of age, then have this test every 5 years. ?Have your cholesterol levels checked more often if: ?Your lipid or cholesterol levels are high. ?You are older than 75 years of age. ?You are at high risk for heart disease. ?What should I know about cancer screening? ?Depending on your health history and family history, you may need to have cancer screening at various ages. This may include screening for: ?Breast cancer. ?Cervical cancer. ?Colorectal cancer. ?Skin cancer. ?Lung cancer. ?What should I know about heart disease, diabetes, and high blood pressure? ?Blood pressure and heart disease ?High blood pressure causes heart disease and increases the risk of stroke. This is more likely to  develop in people who have high blood pressure readings or are overweight. ?Have your blood pressure checked: ?Every 3-5 years if you are 25-82 years of age. ?Every year if you are 43 years old or older. ?Diabetes ?Have regular diabetes screenings. This checks your fasting blood sugar level. Have the screening done: ?Once every three years after age 67 if you are at a normal weight and have a low risk for diabetes. ?More often and at a younger age if you are overweight or have a high risk for diabetes. ?What should I know about preventing infection? ?Hepatitis B ?If you have a higher risk for hepatitis B, you should be screened for this virus. Talk with your health care provider to find out if you are at risk for hepatitis B infection. ?Hepatitis C ?Testing is recommended for: ?Everyone born from [birth year] through 1964. ?Anyone with known risk factors for hepatitis C. ?Sexually transmitted infections (STIs) ?Get screened for STIs, including gonorrhea and chlamydia, if: ?You are sexually active and are younger than 75 years of age. ?You are  older than 75 years of age and your health care provider tells you that you are at risk for this type of infection. ?Your sexual activity has changed since you were last screened, and you are at increased risk

## 2021-09-24 NOTE — Assessment & Plan Note (Signed)
Deteriorated ?Will start Breztri (sample provided) ?RX for Albuterol as needed for breakthrough shortness of breath ?

## 2021-09-24 NOTE — Progress Notes (Signed)
? ?Subjective:  ? ? Patient ID: Kristie Byrd, female    DOB: Dec 30, 1945, 76 y.o.   MRN: [id number] ? ?HPI ? ?Patient presents to clinic today for her annual exam. ? ?Flu: 04/2021 ?Tetanus: 02/2015 ?Pneumovax: 02/03/2017 ?Prevnar: 02/03/2014 ?COVID: Pfizer x2 ?Zostavax: 12/2012 ?Shingrix: 01/2017 ?Pap smear: 06/2016 ?Mammogram: 09/2020 ?Bone density: 09/2020 ?Colon screening: 01/2013 ?Vision screening: as needed ?Dentist: as needed ? ?Diet: She does eat some meat. She eats more veggies than fruits. She tries to avoid fried food. She drinks mostly tea, water and coffee. ?Exercise: None ? ? ?Review of Systems ? ?   ?Past Medical History:  ?Diagnosis Date  ? Arthritis   ? Cancer [facility])   ? cervix, skin  ? Colon polyps   ? COPD (chronic obstructive pulmonary disease) ([place])   ? Depression   ? GERD (gastroesophageal reflux disease)   ? Headache   ? Hypertension   ? Iron deficiency anemia 09/27/2012  ? PONV (postoperative nausea and vomiting)   ? Shingles   ? ? ?Current Outpatient Medications  ?Medication Sig Dispense Refill  ? acetaminophen (TYLENOL) 650 MG CR tablet Take 1,000 mg by mouth every 4 (four) hours as needed.    ? buPROPion (WELLBUTRIN XL) 300 MG 24 hr tablet Take 1 tablet (300 mg total) by mouth daily. 90 tablet 1  ? calcitRIOL (ROCALTROL) 0.25 MCG capsule Take 0.25 mcg by mouth daily.    ? Cholecalciferol (VITAMIN D3) 1000 units CAPS Take 1,000 Units by mouth daily.    ? fluticasone (FLONASE) 50 MCG/ACT nasal spray Place 2 sprays into both nostrils daily.    ? lisinopril (ZESTRIL) 5 MG tablet Take 1 tablet (5 mg total) by mouth daily. 90 tablet 1  ? Multiple Vitamin (MULTIVITAMIN) tablet Take 1 tablet by mouth at bedtime.     ? propranolol (INDERAL) 40 MG tablet Take 1 tablet (40 mg total) by mouth 2 (two) times daily. 180 tablet 1  ? sertraline (ZOLOFT) 100 MG tablet Take 1 tablet (100 mg total) by mouth daily. 90 tablet 1  ? ?No current facility-administered medications for this visit.  ? ? ?Allergies  ?Allergen  Reactions  ? Codeine Nausea And Vomiting  ? Demerol [Meperidine] Nausea And Vomiting  ? Fleet Phospho-Soda [Sodium Phosphates] Nausea And Vomiting  ?  Vomiting ?  ? Latex Rash  ?  Blisters  ? Other Other (See Comments)  ?  Band-Aid, blister  ? Prednisone Other (See Comments)  ?  Chest pressure  ? ? ?Family History  ?Family history unknown: Yes  ? ? ?Social History  ? ?Socioeconomic History  ? Marital status: Single  ?  Spouse name: Not on file  ? Number of children: 2  ? Years of education: Not on file  ? Highest education level: Not on file  ?Occupational History  ? Occupation: retired  ?Tobacco Use  ? Smoking status: Former  ?  Packs/day: 1.00  ?  Years: 37.00  ?  Pack years: 37.00  ?  Types: Cigarettes  ?  Quit date: 11/15/1996  ?  Years since quitting: 24.8  ? Smokeless tobacco: Never  ?Vaping Use  ? Vaping Use: Never used  ?Substance and Sexual Activity  ? Alcohol use: No  ?  Alcohol/week: 0.0 standard drinks  ? Drug use: No  ?  Comment: uses CBD oil   ? Sexual activity: Never  ?Other Topics Concern  ? Not on file  ?Social History Narrative  ? Lives alone.  Divorced. Two daughters-  in 66s, 3 grandchildren.  ? Does not know family history- raised in [facility].    ?   ? Desires CPR.  Does not have HPOA.  ? She would possibly want life support if for short period of time . Unsure about feeding tube.  ? ?Social Determinants of Health  ? ?Financial Resource Strain: Not on file  ?Food Insecurity: Not on file  ?Transportation Needs: Not on file  ?Physical Activity: Not on file  ?Stress: Not on file  ?Social Connections: Not on file  ?Intimate Partner Violence: Not on file  ? ? ? ?Constitutional: Denies fever, malaise, fatigue, headache or abrupt weight changes.  ?HEENT: Denies eye pain, eye redness, ear pain, ringing in the ears, wax buildup, runny nose, nasal congestion, bloody nose, or sore throat. ?Respiratory: Pt reports shortness of breath. Denies difficulty breathing, or sputum production.    ?Cardiovascular: Denies chest pain, chest tightness, palpitations or swelling in the hands or feet.  ?Gastrointestinal: Denies abdominal pain, bloating, constipation, diarrhea or blood in the stool.  ?GU: Denies urgency, frequency, pain with urination, burning sensation, blood in urine, odor or discharge. ?Musculoskeletal: Patient reports chronic back pain, intermittent joint pain.  Denies decrease in range of motion, difficulty with gait, muscle pain or joint swelling.  ?Skin: Denies redness, rashes, lesions or ulcercations.  ?Neurological: Pt reports difficulty with memory. Denies dizziness, difficulty with speech or problems with balance and coordination.  ?Psych: Patient has a history of anxiety and depression.  Denies SI/HI. ? ?No other specific complaints in a complete review of systems (except as listed in HPI above). ? ?Objective:  ? Physical Exam ? ?BP 134/80 (BP Location: Left Arm, Patient Position: Sitting, Cuff Size: Large)   Pulse 69   Temp (!) 96.9 ?F (36.1 ?C) (Temporal)   Ht '5\' 4"'$  (1.626 m)   Wt 230 lb (104.3 kg)   SpO2 98%   BMI 39.48 kg/m?  ? ?Wt Readings from Last 3 Encounters:  ?06/30/21 226 lb 12.8 oz (102.9 kg)  ?04/23/21 227 lb (103 kg)  ?04/06/21 226 lb 4.8 oz (102.6 kg)  ? ? ?General: Appears her stated age, obese in NAD. ?Skin: Warm, dry and intact.  ?HEENT: Head: normal shape and size; Eyes: sclera white, no icterus, conjunctiva pink, PERRLA and EOMs intact; ?Neck:  Neck supple, trachea midline. No masses, lumps or thyromegaly present.  ?Cardiovascular: Normal rate and rhythm. S1,S2 noted.  No murmur, rubs or gallops noted. No JVD or BLE edema. No carotid bruits noted. ?Pulmonary/Chest: Normal effort and diminished vesicular breath sounds. No respiratory distress. No wheezes, rales or ronchi noted.  ?Abdomen: Soft and nontender. Normal bowel sounds. No distention or masses noted. Liver, spleen and kidneys non palpable. ?Musculoskeletal: Strength 5/5 BUE/BLE.  Gait slow and steady  with use of rolling walker. ?Neurological: Alert and oriented. Cranial nerves II-XII grossly intact. Coordination normal.  ?Psychiatric: Mood and affect normal. Behavior is normal. Judgment and thought content normal.  ? ?BMET ?   ?Component Value Date/Time  ? NA 134 (L) 04/06/2021 1314  ? NA 137 02/25/2014 1404  ? K 4.3 04/06/2021 1314  ? K 4.2 02/25/2014 1404  ? CL 99 04/06/2021 1314  ? CL 102 02/25/2014 1404  ? CO2 29 04/06/2021 1314  ? CO2 28 02/25/2014 1404  ? GLUCOSE 93 04/06/2021 1314  ? GLUCOSE 124 (H) 02/25/2014 1404  ? BUN 23 04/06/2021 1314  ? BUN 20 02/10/2017 0000  ? BUN 24 (H) 02/25/2014 1404  ? CREATININE  1.15 (H) 04/06/2021 1314  ? CREATININE 1.28 02/25/2014 1404  ? CALCIUM 9.7 04/06/2021 1322  ? CALCIUM 9.1 04/06/2021 1314  ? GFRNONAA 50 (L) 04/06/2021 1314  ? GFRNONAA 44 (L) 02/25/2014 1404  ? GFRNONAA 45 (L) 10/26/2013 1330  ? GFRAA 56 (L) 10/03/2019 1324  ? GFRAA 54 (L) 02/25/2014 1404  ? GFRAA 52 (L) 10/26/2013 1330  ? ? ?Lipid Panel  ?   ?Component Value Date/Time  ? CHOL 191 04/02/2020 1242  ? TRIG 186 (H) 04/02/2020 1242  ? HDL 58 04/02/2020 1242  ? CHOLHDL 3.3 04/02/2020 1242  ? VLDL 37 04/02/2020 1242  ? [place] 96 04/02/2020 1242  ? ? ?CBC ?   ?Component Value Date/Time  ? WBC 6.5 04/06/2021 1314  ? RBC 4.31 04/06/2021 1314  ? HGB 12.7 04/06/2021 1314  ? HGB 11.4 (L) 02/25/2014 1404  ? HCT 40.4 04/06/2021 1314  ? HCT 34.9 (L) 02/25/2014 1404  ? PLT 264 04/06/2021 1314  ? PLT 256 02/25/2014 1404  ? MCV 93.7 04/06/2021 1314  ? MCV 89 02/25/2014 1404  ? MCH 29.5 04/06/2021 1314  ? MCHC 31.4 04/06/2021 1314  ? RDW 13.2 04/06/2021 1314  ? RDW 14.7 (H) 02/25/2014 1404  ? LYMPHSABS 1.1 04/06/2021 1314  ? LYMPHSABS 1.1 02/25/2014 1404  ? MONOABS 0.6 04/06/2021 1314  ? MONOABS 0.5 02/25/2014 1404  ? EOSABS 0.0 04/06/2021 1314  ? EOSABS 0.0 02/25/2014 1404  ? BASOSABS 0.0 04/06/2021 1314  ? BASOSABS 0.1 02/25/2014 1404  ? ? ?Hgb A1C ?Lab Results  ?Component Value Date  ? HGBA1C 5.5 08/19/2017   ? ? ? ? ? ? ?   ?Assessment & Plan:  ? ?Preventative Health Maintenance: ? ?Encouraged her to get a flu shot in the fall ?Tetanus UTD ?Pneumovax and Prevnar UTD ?Encouraged her to get her second Shingrix vaccine ?Zostavax UTD

## 2021-09-25 ENCOUNTER — Ambulatory Visit: Payer: Self-pay

## 2021-09-25 LAB — CBC
HCT: 35.2 % (ref 35.0–45.0)
Hemoglobin: 11.5 g/dL — ABNORMAL LOW (ref 11.7–15.5)
MCH: 29 pg (ref 27.0–33.0)
MCHC: 32.7 g/dL (ref 32.0–36.0)
MCV: 88.9 fL (ref 80.0–100.0)
MPV: 9.7 fL (ref 7.5–12.5)
Platelets: 245 10*3/uL (ref 140–400)
RBC: 3.96 10*6/uL (ref 3.80–5.10)
RDW: 13.1 % (ref 11.0–15.0)
WBC: 5.1 10*3/uL (ref 3.8–10.8)

## 2021-09-25 LAB — COMPLETE METABOLIC PANEL WITH GFR
AG Ratio: 1.6 (calc) (ref 1.0–2.5)
ALT: 12 U/L (ref 6–29)
AST: 16 U/L (ref 10–35)
Albumin: 4 g/dL (ref 3.6–5.1)
Alkaline phosphatase (APISO): 97 U/L (ref 37–153)
BUN: 18 mg/dL (ref 7–25)
CO2: 32 mmol/L (ref 20–32)
Calcium: 9.7 mg/dL (ref 8.6–10.4)
Chloride: 100 mmol/L (ref 98–110)
Creat: 1 mg/dL (ref 0.60–1.00)
Globulin: 2.5 g/dL (calc) (ref 1.9–3.7)
Glucose, Bld: 85 mg/dL (ref 65–139)
Potassium: 4.6 mmol/L (ref 3.5–5.3)
Sodium: 139 mmol/L (ref 135–146)
Total Bilirubin: 0.3 mg/dL (ref 0.2–1.2)
Total Protein: 6.5 g/dL (ref 6.1–8.1)
eGFR: 59 mL/min/{1.73_m2} — ABNORMAL LOW (ref >=60)

## 2021-09-25 LAB — HEMOGLOBIN A1C
Hgb A1c MFr Bld: 5.5 % of total Hgb (ref <5.7)
Mean Plasma Glucose: 111 mg/dL
eAG (mmol/L): 6.2 mmol/L

## 2021-09-25 LAB — LIPID PANEL
Cholesterol: 186 mg/dL (ref <200)
HDL: 63 mg/dL (ref >=50)
LDL Cholesterol (Calc): 97 mg/dL (calc)
Non-HDL Cholesterol (Calc): 123 mg/dL (calc) (ref <130)
Total CHOL/HDL Ratio: 3 (calc) (ref <5.0)
Triglycerides: 160 mg/dL — ABNORMAL HIGH (ref <150)

## 2021-09-25 NOTE — Telephone Encounter (Signed)
?  Chief Complaint: results advice ?Symptoms: NA ?Frequency: NA ?Pertinent Negatives: NA ?Disposition: '[]'$ ED /'[]'$ Urgent Care (no appt availability in office) / '[]'$ Appointment(In office/virtual)/ '[]'$  [facility]/ '[x]'$ Home Care/ '[]'$ Refused Recommended Disposition /'[]'$ [facility]/ '[]'$  Follow-up with PCP ?Additional Notes: pt called, was given advice on kidney function and lab results from [place], NP from [place] 09/24/21. Pt states she wasn't taking anything but Tylenol but was just more concerned about GFR and will increase fluids.  ? ?Summary: Kidney advice  ? Pt has a question regarding her lab results - GFR remains slightly abnormal. Pt would like to know at what level are her kidneys functioning?   ?  ? ?Reason for Disposition ? Caller requesting routine or non-urgent lab result ? ?Answer Assessment - Initial Assessment Questions ?1. REASON FOR CALL or QUESTION: "What is your reason for calling today?" or "How can I best help you?" or "What question do you have that I can help answer?" ?    Pt reviewed labs and wanted to see about kidney function ? ?Protocols used: Information Only Call - No Triage-A-AH, PCP Call - No Triage-A-AH ? ?

## 2021-09-28 ENCOUNTER — Other Ambulatory Visit: Payer: Self-pay | Admitting: Internal Medicine

## 2021-09-29 ENCOUNTER — Telehealth: Payer: Self-pay

## 2021-09-29 NOTE — Telephone Encounter (Signed)
Message received from Kristie Byrd in in [place] insurance auth dept:  Good morning, patient is on 5/22 schedule for feraheme. patient now has [employer] and feraheme is non preferred. venofer is the preferred. can this be changed to the preferred ? ?Patient is notified and agrees with the change. ?

## 2021-10-05 ENCOUNTER — Inpatient Hospital Stay: Payer: Medicare HMO | Attending: Internal Medicine

## 2021-10-05 ENCOUNTER — Inpatient Hospital Stay (HOSPITAL_BASED_OUTPATIENT_CLINIC_OR_DEPARTMENT_OTHER): Payer: Medicare HMO | Admitting: Internal Medicine

## 2021-10-05 ENCOUNTER — Inpatient Hospital Stay: Payer: Medicare HMO

## 2021-10-05 ENCOUNTER — Encounter: Payer: Self-pay | Admitting: Internal Medicine

## 2021-10-05 DIAGNOSIS — N183 Chronic kidney disease, stage 3 unspecified: Secondary | ICD-10-CM

## 2021-10-05 DIAGNOSIS — D631 Anemia in chronic kidney disease: Secondary | ICD-10-CM | POA: Diagnosis not present

## 2021-10-05 DIAGNOSIS — Z79899 Other long term (current) drug therapy: Secondary | ICD-10-CM | POA: Insufficient documentation

## 2021-10-05 LAB — CBC WITH DIFFERENTIAL/PLATELET
Abs Immature Granulocytes: 0.02 10*3/uL (ref 0.00–0.07)
Basophils Absolute: 0 10*3/uL (ref 0.0–0.1)
Basophils Relative: 1 %
Eosinophils Absolute: 0 10*3/uL (ref 0.0–0.5)
Eosinophils Relative: 0 %
HCT: 37.3 % (ref 36.0–46.0)
Hemoglobin: 12 g/dL (ref 12.0–15.0)
Immature Granulocytes: 0 %
Lymphocytes Relative: 21 %
Lymphs Abs: 1.1 10*3/uL (ref 0.7–4.0)
MCH: 29.6 pg (ref 26.0–34.0)
MCHC: 32.2 g/dL (ref 30.0–36.0)
MCV: 92.1 fL (ref 80.0–100.0)
Monocytes Absolute: 0.6 10*3/uL (ref 0.1–1.0)
Monocytes Relative: 11 %
Neutro Abs: 3.5 10*3/uL (ref 1.7–7.7)
Neutrophils Relative %: 67 %
Platelets: 220 10*3/uL (ref 150–400)
RBC: 4.05 MIL/uL (ref 3.87–5.11)
RDW: 13.5 % (ref 11.5–15.5)
WBC: 5.2 10*3/uL (ref 4.0–10.5)
nRBC: 0 % (ref 0.0–0.2)

## 2021-10-05 LAB — FERRITIN: Ferritin: 23 ng/mL (ref 11–307)

## 2021-10-05 LAB — BASIC METABOLIC PANEL
Anion gap: 8 (ref 5–15)
BUN: 18 mg/dL (ref 8–23)
CO2: 32 mmol/L (ref 22–32)
Calcium: 9.1 mg/dL (ref 8.9–10.3)
Chloride: 97 mmol/L — ABNORMAL LOW (ref 98–111)
Creatinine, Ser: 1.11 mg/dL — ABNORMAL HIGH (ref 0.44–1.00)
GFR, Estimated: 52 mL/min — ABNORMAL LOW (ref >60)
Glucose, Bld: 94 mg/dL (ref 70–99)
Potassium: 4.4 mmol/L (ref 3.5–5.1)
Sodium: 137 mmol/L (ref 135–145)

## 2021-10-05 LAB — IRON AND TIBC
Iron: 62 ug/dL (ref 28–170)
Saturation Ratios: 15 % (ref 10.4–31.8)
TIBC: 405 ug/dL (ref 250–450)
UIBC: 343 ug/dL

## 2021-10-05 NOTE — Assessment & Plan Note (Addendum)
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  Iron tablets: not on PO iron sec to lack of sample s[Aurxya from neprhology]. Recommend gentle iron 1 pill a day; should not upset the stomach or cause constipation.     # CKD- stage III [Dr.Lateef]-GFR 52  Followed by nephrology.  # DISPOSITION: #  HOLD venofer # follow up in 1st week dec-labs/cbc/bmpiron studies/fertitin-/possible venofer-Dr.B

## 2021-10-05 NOTE — Patient Instructions (Signed)
#  Recommend gentle iron 1 pill a day; should not upset your stomach or cause constipation.  Talk to the pharmacist if you can find it/it is over-the-counter.  

## 2021-10-05 NOTE — Progress Notes (Signed)
[facility] NOTE  Patient Care Team: Jearld Fenton, NP as PCP - General (Internal Medicine) Juanito Doom, MD as Consulting Physician (Pulmonary Disease) Forest Gleason, MD as Consulting Physician (Oncology) Gatha Mayer, MD as Consulting Physician (Gastroenterology) Leandrew Koyanagi, MD as Referring Physician (Ophthalmology) Cammie Sickle, MD as Consulting Physician (Oncology)  CHIEF COMPLAINTS/PURPOSE OF CONSULTATION: Anemia  # ANEMIA- sec to IDA/CKD [Dr.Lateef-hemoglobin 11.5/iron saturation 13% ferritin 30; intolerant to p.o. iron.]# colonoscopy-~ 2016; [not until 2024]; STOPPED Aurexa [JAN 2023]  # CKD stage III  # Hx of smoking quit  Oncology History   No history exists.     HISTORY OF PRESENTING ILLNESS: Walks with a rolling walker because of back pain.  Alone. Kristie Byrd 76 y.o.  female longstanding history of chronic kidney disease stage III/ongoing anemia is here for a follow up.  Patient came of iron tablets about 3 to 4 months ago as she ran out of samples/nephrology.  None available.  No nausea no vomiting. No blood in stools or black or stools.   Review of Systems  Constitutional:  Positive for malaise/fatigue. Negative for chills, diaphoresis, fever and weight loss.  HENT:  Negative for nosebleeds and sore throat.   Eyes:  Negative for double vision.  Respiratory:  Negative for cough, hemoptysis, sputum production, shortness of breath and wheezing.   Cardiovascular:  Negative for chest pain, palpitations, orthopnea and leg swelling.  Gastrointestinal:  Positive for constipation. Negative for abdominal pain, blood in stool, diarrhea, heartburn, melena, nausea and vomiting.  Genitourinary:  Negative for dysuria, frequency and urgency.  Musculoskeletal:  Negative for back pain and joint pain.  Skin: Negative.  Negative for itching and rash.  Neurological:  Negative for dizziness, tingling, focal weakness, weakness  and headaches.  Endo/Heme/Allergies:  Does not bruise/bleed easily.  Psychiatric/Behavioral:  Negative for depression. The patient is not nervous/anxious and does not have insomnia.     MEDICAL HISTORY:  Past Medical History:  Diagnosis Date   Arthritis    Cancer ([place])    cervix, skin   Colon polyps    COPD (chronic obstructive pulmonary disease) ([facility])    Depression    GERD (gastroesophageal reflux disease)    Headache    Hypertension    Iron deficiency anemia 09/27/2012   PONV (postoperative nausea and vomiting)    Shingles     SURGICAL HISTORY: Past Surgical History:  Procedure Laterality Date   BREAST BIOPSY Bilateral    neg   CARPAL TUNNEL RELEASE Bilateral    COLONOSCOPY  multiple   KNEE ARTHROPLASTY Right 05/12/2016   Procedure: COMPUTER ASSISTED TOTAL KNEE ARTHROPLASTY;  Surgeon: Dereck Leep, MD;  Location: [facility] ORS;  Service: Orthopedics;  Laterality: Right;   KNEE SURGERY Right    NOSE SURGERY     TONSILLECTOMY      SOCIAL HISTORY: Social History   Socioeconomic History   Marital status: Divorced    Spouse name: Not on file   Number of children: 2   Years of education: Not on file   Highest education level: Not on file  Occupational History   Occupation: retired  Tobacco Use   Smoking status: Former    Packs/day: 1.00    Years: 37.00    Pack years: 37.00    Types: Cigarettes    Quit date: 11/15/1996    Years since quitting: 24.9   Smokeless tobacco: Never  Vaping Use   Vaping Use: Never used  Substance and Sexual  Activity   Alcohol use: No    Alcohol/week: 0.0 standard drinks   Drug use: No    Comment: uses CBD oil    Sexual activity: Never  Other Topics Concern   Not on file  Social History Narrative   Lives alone.  Divorced. Two daughters- in 34s, 3 grandchildren.   Does not know family history- raised in [facility].        Desires CPR.  Does not have HPOA.   She would possibly want life support if for short period of time .  Unsure about feeding tube.   Social Determinants of Health   Financial Resource Strain: Low Risk    Difficulty of Paying Living Expenses: Not very hard  Food Insecurity: No Food Insecurity   Worried About Charity fundraiser in the Last Year: Never true   Ran Out of Food in the Last Year: Never true  Transportation Needs: No Transportation Needs   Lack of Transportation (Medical): No   Lack of Transportation (Non-Medical): No  Physical Activity: Inactive   Days of Exercise per Week: 0 days   Minutes of Exercise per Session: 0 min  Stress: No Stress Concern Present   Feeling of Stress : Not at all  Social Connections: Socially Isolated   Frequency of Communication with Friends and Family: More than three times a week   Frequency of Social Gatherings with Friends and Family: Never   Attends Religious Services: Never   Marine scientist or Organizations: No   Attends Music therapist: Never   Marital Status: Divorced  Human resources officer Violence: Not At Risk   Fear of Current or Ex-Partner: No   Emotionally Abused: No   Physically Abused: No   Sexually Abused: No    FAMILY HISTORY: Family History  Problem Relation Age of Onset   Colon cancer Daughter     ALLERGIES:  is allergic to codeine, demerol [meperidine], fleet phospho-soda [sodium phosphates], latex, other, and prednisone.  MEDICATIONS:  Current Outpatient Medications  Medication Sig Dispense Refill   acetaminophen (TYLENOL) 650 MG CR tablet Take 1,000 mg by mouth every 4 (four) hours as needed.     albuterol (VENTOLIN HFA) 108 (90 Base) MCG/ACT inhaler Inhale 2 puffs into the lungs every 6 (six) hours as needed for wheezing or shortness of breath. 8 g 0   Budeson-Glycopyrrol-Formoterol (BREZTRI AEROSPHERE) 160-9-4.8 MCG/ACT AERO Inhale 2 puffs into the lungs 2 (two) times daily. 10.7 g 11   buPROPion (WELLBUTRIN XL) 300 MG 24 hr tablet Take 1 tablet (300 mg total) by mouth daily. 90 tablet 1    calcitRIOL (ROCALTROL) 0.25 MCG capsule Take 0.25 mcg by mouth daily.     Cholecalciferol (VITAMIN D3) 1000 units CAPS Take 1,000 Units by mouth daily.     fluticasone (FLONASE) 50 MCG/ACT nasal spray Place 2 sprays into both nostrils daily.     lisinopril (ZESTRIL) 5 MG tablet Take 1 tablet (5 mg total) by mouth daily. 90 tablet 1   Multiple Vitamin (MULTIVITAMIN) tablet Take 1 tablet by mouth at bedtime.      propranolol (INDERAL) 40 MG tablet Take 1 tablet (40 mg total) by mouth 2 (two) times daily. 180 tablet 1   sertraline (ZOLOFT) 100 MG tablet Take 1 tablet (100 mg total) by mouth daily. 90 tablet 1   No current facility-administered medications for this visit.      Marland Kitchen  PHYSICAL EXAMINATION: ECOG PERFORMANCE STATUS: 1 - Symptomatic but completely  ambulatory  Vitals:   10/05/21 1301  BP: 137/66  Pulse: 65  Temp: 98 F (36.7 C)  SpO2: 95%   Filed Weights   10/05/21 1301  Weight: 229 lb 11.2 oz (104.2 kg)    Physical Exam HENT:     Head: Normocephalic and atraumatic.     Mouth/Throat:     Pharynx: No oropharyngeal exudate.  Eyes:     Pupils: Pupils are equal, round, and reactive to light.  Cardiovascular:     Rate and Rhythm: Normal rate and regular rhythm.  Pulmonary:     Effort: No respiratory distress.     Breath sounds: No wheezing.  Abdominal:     General: Bowel sounds are normal. There is no distension.     Palpations: Abdomen is soft. There is no mass.     Tenderness: There is no abdominal tenderness. There is no guarding or rebound.  Musculoskeletal:        General: No tenderness. Normal range of motion.     Cervical back: Normal range of motion and neck supple.  Skin:    General: Skin is warm.  Neurological:     Mental Status: She is alert and oriented to person, place, and time.  Psychiatric:        Mood and Affect: Affect normal.     LABORATORY DATA:  I have reviewed the data as listed Lab Results  Component Value Date   WBC 5.2 10/05/2021    HGB 12.0 10/05/2021   HCT 37.3 10/05/2021   MCV 92.1 10/05/2021   PLT 220 10/05/2021   Recent Labs    04/06/21 1314 04/06/21 1322 09/24/21 1514 10/05/21 1232  NA 134*  --  139 137  K 4.3  --  4.6 4.4  CL 99  --  100 97*  CO2 29  --  32 32  GLUCOSE 93  --  85 94  BUN 23  --  18 18  CREATININE 1.15*  --  1.00 1.11*  CALCIUM 9.1 9.7 9.7 9.1  GFRNONAA 50*  --   --  52*  PROT 7.4  --  6.5  --   ALBUMIN 4.2  --   --   --   AST 20  --  16  --   ALT 17  --  12  --   ALKPHOS 85  --   --   --   BILITOT 0.2*  --  0.3  --     RADIOGRAPHIC STUDIES: I have personally reviewed the radiological images as listed and agreed with the findings in the report. No results found.  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  Iron tablets: not on PO iron sec to lack of sample s[Aurxya from neprhology]. Recommend gentle iron 1 pill a day; should not upset the stomach or cause constipation.     # CKD- stage III [Dr.Lateef]-GFR 52  Followed by nephrology.  # DISPOSITION: #  HOLD venofer # follow up in 1st week dec-labs/cbc/bmpiron studies/fertitin-/possible venofer-Dr.B   All questions were answered. The patient knows to call the clinic with any problems, questions or concerns.    Cammie Sickle, MD 10/05/2021 1:31 PM

## 2021-10-05 NOTE — Addendum Note (Signed)
Addended by: Leeann Must on: 10/05/2021 01:37 PM   Modules accepted: Orders

## 2021-10-08 ENCOUNTER — Ambulatory Visit
Admission: RE | Admit: 2021-10-08 | Discharge: 2021-10-08 | Disposition: A | Discharge status: Home / Self Care | Payer: Medicare HMO | Source: Ambulatory Visit | Attending: Internal Medicine | Admitting: Internal Medicine

## 2021-10-08 DIAGNOSIS — Z1231 Encounter for screening mammogram for malignant neoplasm of breast: Secondary | ICD-10-CM | POA: Diagnosis not present

## 2021-10-16 ENCOUNTER — Other Ambulatory Visit: Payer: Self-pay | Admitting: Internal Medicine

## 2021-10-19 NOTE — Telephone Encounter (Signed)
Requested Prescriptions  Pending Prescriptions Disp Refills  . albuterol (VENTOLIN HFA) 108 (90 Base) MCG/ACT inhaler [Pharmacy Med Name: ALBUTEROL SULFATE HFA 108 (90 Base) MCG/ACT Aerosol Solution] 1 each 2    Sig: INHALE 2 PUFFS EVERY 6 HOURS AS NEEDED FOR WHEEZING OR SHORTNESS OF BREATH.     Pulmonology:  Beta Agonists 2 Passed - 10/16/2021  5:08 PM      Passed - Last BP in normal range    BP Readings from Last 1 Encounters:  10/05/21 137/66         Passed - Last Heart Rate in normal range    Pulse Readings from Last 1 Encounters:  10/05/21 65         Passed - Valid encounter within last 12 months    Recent Outpatient Visits          3 weeks ago Encounter for general adult medical examination with abnormal findings   [place], NP   5 months ago Burning with urination   [facility] [place], Coralie Keens, NP   7 months ago [facility], Coralie Keens, NP   1 year ago Medicare annual wellness visit, subsequent   [facility] [place], Coralie Keens, [place]

## 2021-11-04 NOTE — Telephone Encounter (Signed)
Received response from [employer]  Patient was setup on 09-30-21 with an AirSence 10 auto set. I added you to have viewing rights in Air view.   Currently last 30 days usage is 57%   Thank you,   Demetrius Charity

## 2021-12-04 ENCOUNTER — Ambulatory Visit: Payer: Medicare HMO | Admitting: Adult Health

## 2021-12-04 ENCOUNTER — Encounter: Payer: Self-pay | Admitting: Adult Health

## 2021-12-04 VITALS — BP 122/60 | HR 70 | Temp 98.4°F | Ht 64.0 in | Wt 232.6 lb

## 2021-12-04 DIAGNOSIS — G4733 Obstructive sleep apnea (adult) (pediatric): Secondary | ICD-10-CM | POA: Diagnosis not present

## 2021-12-04 DIAGNOSIS — Z6839 Body mass index (BMI) 39.0-39.9, adult: Secondary | ICD-10-CM | POA: Diagnosis not present

## 2021-12-04 NOTE — Progress Notes (Signed)
$'@Patient'M$  ID: Kristie Byrd, female    DOB: 1946/02/10, 76 y.o.   MRN: [id number]  Chief Complaint  Patient presents with   Follow-up    Referring provider: Jearld Fenton, NP  HPI: 76 year old female seen for sleep consult June 30, 2021 for daytime sleepiness, snoring and restless sleep found to have moderate obstructive sleep apnea on home sleep study  TEST/EVENTS :  home sleep study that was completed on August 02, 2021 that showed moderate sleep apnea with AHI 24.5/hour and SPO2 low at 75%.  12/04/2021 OSA Patient returns for a 61-monthfollow-up.  Patient was seen for sleep consult June 30, 2021 for daytime sleepiness, snoring and restless sleep.  She was set up for home sleep study that was completed on August 02, 2021 that showed moderate sleep apnea with AHI 24.5/hour and SPO2 low at 75%.  Patient was started on CPAP.  Unfortunately patient says she could not tolerate.  And is turned her CPAP machine back in to the homecare company.Has upper/lower dentures.  We discussed treatment options including weight loss, positional sleep restarting CPAP and inspire device.  Patient is not a candidate for oral appliance.  Patient has significant sleep apnea with associated hypoxemia.  Advised that this would be beneficial to have treatment for her sleep apnea.  Patient declines inspire referral.  Patient says she will try CPAP again.  Patient says she tried multiple mask but was unable to tolerate.  Also had pressure issues.  We discussed setting up for an in lab CPAP titration study.  Allergies  Allergen Reactions   Codeine Nausea And Vomiting   Demerol [Meperidine] Nausea And Vomiting   Fleet Phospho-Soda [Sodium Phosphates] Nausea And Vomiting    Vomiting    Latex Rash    Blisters   Other Other (See Comments)    Band-Aid, blister   Prednisone Other (See Comments)    Chest pressure    Immunization History  Administered Date(s) Administered   Fluad Quad(high Dose 65+)  03/21/2019, 03/31/2020   Influenza Split 02/15/2013   Influenza, High Dose Seasonal PF 02/02/2014, 01/21/2015, 01/25/2017, 07/15/2018   Influenza, Seasonal, Injecte, Preservative Fre 01/02/2016   Influenza-Unspecified 01/25/2017, 04/17/2021   PFIZER(Purple Top)SARS-COV-2 Vaccination 06/29/2019, 07/20/2019   Pneumococcal Conjugate-13 02/02/2014   Pneumococcal Polysaccharide-23 01/25/2017   Pneumococcal-Unspecified 02/19/2013   Td 11/05/2013   Tdap 02/20/2015   Zoster Recombinat (Shingrix) 01/25/2017   Zoster, Live 12/27/2012    Past Medical History:  Diagnosis Date   Arthritis    Cancer ([place]    cervix, skin   Colon polyps    COPD (chronic obstructive pulmonary disease) (HCC)    Depression    GERD (gastroesophageal reflux disease)    Headache    Hypertension    Iron deficiency anemia 09/27/2012   PONV (postoperative nausea and vomiting)    Shingles     Tobacco History: Social History   Tobacco Use  Smoking Status Former   Packs/day: 1.00   Years: 37.00   Total pack years: 37.00   Types: Cigarettes   Quit date: 11/15/1996   Years since quitting: 25.0  Smokeless Tobacco Never   Counseling given: Not Answered   Outpatient Medications Prior to Visit  Medication Sig Dispense Refill   acetaminophen (TYLENOL) 650 MG CR tablet Take 1,000 mg by mouth every 4 (four) hours as needed.     albuterol (VENTOLIN HFA) 108 (90 Base) MCG/ACT inhaler INHALE 2 PUFFS EVERY 6 HOURS AS NEEDED FOR WHEEZING OR SHORTNESS OF BREATH. 8  g 2   Budeson-Glycopyrrol-Formoterol (BREZTRI AEROSPHERE) 160-9-4.8 MCG/ACT AERO Inhale 2 puffs into the lungs 2 (two) times daily. 10.7 g 11   buPROPion (WELLBUTRIN XL) 300 MG 24 hr tablet Take 1 tablet (300 mg total) by mouth daily. 90 tablet 1   calcitRIOL (ROCALTROL) 0.25 MCG capsule Take 0.25 mcg by mouth daily.     Cholecalciferol (VITAMIN D3) 1000 units CAPS Take 1,000 Units by mouth daily.     lisinopril (ZESTRIL) 5 MG tablet Take 1 tablet (5 mg total) by  mouth daily. 90 tablet 1   Multiple Vitamin (MULTIVITAMIN) tablet Take 1 tablet by mouth at bedtime.      propranolol (INDERAL) 40 MG tablet Take 1 tablet (40 mg total) by mouth 2 (two) times daily. 180 tablet 1   sertraline (ZOLOFT) 100 MG tablet Take 1 tablet (100 mg total) by mouth daily. 90 tablet 1   fluticasone (FLONASE) 50 MCG/ACT nasal spray Place 2 sprays into both nostrils daily.     No facility-administered medications prior to visit.     Review of Systems:   Constitutional:   No  weight loss, night sweats,  Fevers, chills,  +fatigue, or  lassitude.  HEENT:   No headaches,  Difficulty swallowing,  Tooth/dental problems, or  Sore throat,                No sneezing, itching, ear ache, nasal congestion, post nasal drip,   CV:  No chest pain,  Orthopnea, PND, swelling in lower extremities, anasarca, dizziness, palpitations, syncope.   GI  No heartburn, indigestion, abdominal pain, nausea, vomiting, diarrhea, change in bowel habits, loss of appetite, bloody stools.   Resp: No shortness of breath with exertion or at rest.  No excess mucus, no productive cough,  No non-productive cough,  No coughing up of blood.  No change in color of mucus.  No wheezing.  No chest wall deformity  Skin: no rash or lesions.  GU: no dysuria, change in color of urine, no urgency or frequency.  No flank pain, no hematuria   MS:  No joint pain or swelling.  No decreased range of motion.  No back pain.    Physical Exam  BP 122/60 (BP Location: Left Arm, Cuff Size: Large)   Pulse 70   Temp 98.4 F (36.9 C) (Temporal)   Ht '5\' 4"'$  (1.626 m)   Wt 232 lb 9.6 oz (105.5 kg)   SpO2 95%   BMI 39.93 kg/m   GEN: A/Ox3; pleasant , NAD, well nourished    HEENT:  [place]/AT,    NOSE-clear, THROAT-clear, no lesions, no postnasal drip or exudate noted.  Clas 2-3 MP airway   NECK:  Supple w/ fair ROM; no JVD; normal carotid impulses w/o bruits; no thyromegaly or nodules palpated; no lymphadenopathy.    RESP   Clear  P & A; w/o, wheezes/ rales/ or rhonchi. no accessory muscle use, no dullness to percussion  CARD:  RRR, no m/r/g, no peripheral edema, pulses intact, no cyanosis or clubbing.  GI:   Soft & nt; nml bowel sounds; no organomegaly or masses detected.   Musco: Warm bil, no deformities or joint swelling noted.   Neuro: alert, no focal deficits noted.    Skin: Warm, no lesions or rashes    Lab Results:  CBC    Component Value Date/Time   WBC 5.2 10/05/2021 1232   RBC 4.05 10/05/2021 1232   HGB 12.0 10/05/2021 1232   HGB 11.4 (L) 02/25/2014 1404  HCT 37.3 10/05/2021 1232   HCT 34.9 (L) 02/25/2014 1404   PLT 220 10/05/2021 1232   PLT 256 02/25/2014 1404   MCV 92.1 10/05/2021 1232   MCV 89 02/25/2014 1404   MCH 29.6 10/05/2021 1232   MCHC 32.2 10/05/2021 1232   RDW 13.5 10/05/2021 1232   RDW 14.7 (H) 02/25/2014 1404   LYMPHSABS 1.1 10/05/2021 1232   LYMPHSABS 1.1 02/25/2014 1404   MONOABS 0.6 10/05/2021 1232   MONOABS 0.5 02/25/2014 1404   EOSABS 0.0 10/05/2021 1232   EOSABS 0.0 02/25/2014 1404   BASOSABS 0.0 10/05/2021 1232   BASOSABS 0.1 02/25/2014 1404    BMET    Component Value Date/Time   NA 137 10/05/2021 1232   NA 137 02/25/2014 1404   K 4.4 10/05/2021 1232   K 4.2 02/25/2014 1404   CL 97 (L) 10/05/2021 1232   CL 102 02/25/2014 1404   CO2 32 10/05/2021 1232   CO2 28 02/25/2014 1404   GLUCOSE 94 10/05/2021 1232   GLUCOSE 124 (H) 02/25/2014 1404   BUN 18 10/05/2021 1232   BUN 20 02/10/2017 0000   BUN 24 (H) 02/25/2014 1404   CREATININE 1.11 (H) 10/05/2021 1232   CREATININE 1.00 09/24/2021 1514   CALCIUM 9.1 10/05/2021 1232   CALCIUM 9.7 04/06/2021 1322   GFRNONAA 52 (L) 10/05/2021 1232   GFRNONAA 44 (L) 02/25/2014 1404   GFRNONAA 45 (L) 10/26/2013 1330   GFRAA 56 (L) 10/03/2019 1324   GFRAA 54 (L) 02/25/2014 1404   GFRAA 52 (L) 10/26/2013 1330    BNP No results found for: "BNP"  ProBNP No results found for: "PROBNP"  Imaging: No  results found.        No data to display          No results found for: "NITRICOXIDE"      Assessment & Plan:   OSA (obstructive sleep apnea) Moderate obstructive sleep apnea.  Unfortunately patient returned to her CPAP machine.  Advised on the dangers of untreated sleep apnea.  Patient declines inspire device referral.  She is not a candidate for oral appliance.  Patient says she will try CPAP again.  Recommend setting up for a CPAP titration study to help with tolerability.  Plan  . Patient Instructions  Set up for CPAP titration study  Work on healthy weight loss Healthy sleep regimen Do not drive if sleepy Follow-up in 3 months and As needed       Class 2 obesity due to excess calories with body mass index (BMI) of 39.0 to 39.9 in adult Healthy weight loss      Rexene Edison, NP 12/04/2021

## 2021-12-04 NOTE — Assessment & Plan Note (Signed)
Moderate obstructive sleep apnea.  Unfortunately patient returned to her CPAP machine.  Advised on the dangers of untreated sleep apnea.  Patient declines inspire device referral.  She is not a candidate for oral appliance.  Patient says she will try CPAP again.  Recommend setting up for a CPAP titration study to help with tolerability.  Plan  . Patient Instructions  Set up for CPAP titration study  Work on healthy weight loss Healthy sleep regimen Do not drive if sleepy Follow-up in 3 months and As needed

## 2021-12-04 NOTE — Patient Instructions (Signed)
Set up for CPAP titration study  Work on healthy weight loss Healthy sleep regimen Do not drive if sleepy Follow-up in 3 months and As needed

## 2021-12-04 NOTE — Assessment & Plan Note (Signed)
Healthy weight loss 

## 2021-12-17 NOTE — Progress Notes (Signed)
Reviewed and agree with assessment/plan.   Sherlyn Ebbert, MD [facility] 12/17/2021, 9:58 AM Pager:  [phone]  

## 2021-12-20 ENCOUNTER — Other Ambulatory Visit: Payer: Self-pay | Admitting: Internal Medicine

## 2021-12-20 DIAGNOSIS — I10 Essential (primary) hypertension: Secondary | ICD-10-CM

## 2021-12-20 DIAGNOSIS — F419 Anxiety disorder, unspecified: Secondary | ICD-10-CM

## 2021-12-20 DIAGNOSIS — F32A Depression, unspecified: Secondary | ICD-10-CM

## 2021-12-20 DIAGNOSIS — R251 Tremor, unspecified: Secondary | ICD-10-CM

## 2021-12-21 NOTE — Telephone Encounter (Signed)
Requested Prescriptions  Pending Prescriptions Disp Refills  . lisinopril (ZESTRIL) 5 MG tablet [Pharmacy Med Name: LISINOPRIL 5 MG Tablet] 90 tablet 0    Sig: TAKE 1 TABLET EVERY DAY     Cardiovascular:  ACE Inhibitors Failed - 12/20/2021  3:16 AM      Failed - Cr in normal range and within 180 days    Creat  Date Value Ref Range Status  09/24/2021 1.00 0.60 - 1.00 mg/dL Final   Creatinine, Ser  Date Value Ref Range Status  10/05/2021 1.11 (H) 0.44 - 1.00 mg/dL Final         Passed - K in normal range and within 180 days    Potassium  Date Value Ref Range Status  10/05/2021 4.4 3.5 - 5.1 mmol/L Final  02/25/2014 4.2 3.5 - 5.1 mmol/L Final         Passed - Patient is not pregnant      Passed - Last BP in normal range    BP Readings from Last 1 Encounters:  12/04/21 122/60         Passed - Valid encounter within last 6 months    Recent Outpatient Visits          2 months ago Encounter for general adult medical examination with abnormal findings   [facility] [place], Coralie Keens, NP   8 months ago Burning with urination   [facility] [place], Coralie Keens, NP   9 months ago [facility] [place], Coralie Keens, NP   1 year ago Medicare annual wellness visit, subsequent   [facility] [place], [place] W, NP             . buPROPion (WELLBUTRIN XL) 300 MG 24 hr tablet [Pharmacy Med Name: BUPROPION HYDROCHLORIDE ER (XL) 300 MG Tablet Extended Release 24 Hour] 90 tablet 0    Sig: TAKE 1 TABLET EVERY DAY     Psychiatry: Antidepressants - bupropion Failed - 12/20/2021  3:16 AM      Failed - Cr in normal range and within 360 days    Creat  Date Value Ref Range Status  09/24/2021 1.00 0.60 - 1.00 mg/dL Final   Creatinine, Ser  Date Value Ref Range Status  10/05/2021 1.11 (H) 0.44 - 1.00 mg/dL Final         Failed - Completed PHQ-2 or PHQ-9 in the last 360 days      Passed - AST in normal range and within 360 days     AST  Date Value Ref Range Status  09/24/2021 16 10 - 35 U/L Final   SGOT(AST)  Date Value Ref Range Status  02/25/2014 15 15 - 37 Unit/L Final         Passed - ALT in normal range and within 360 days    ALT  Date Value Ref Range Status  09/24/2021 12 6 - 29 U/L Final   SGPT (ALT)  Date Value Ref Range Status  02/25/2014 23 U/L Final    Comment:    14-63 NOTE: New Reference Range 12/04/13          Passed - Last BP in normal range    BP Readings from Last 1 Encounters:  12/04/21 122/60         Passed - Valid encounter within last 6 months    Recent Outpatient Visits          2 months ago Encounter for general  adult medical examination with abnormal findings   [facility] [place], Coralie Keens, NP   8 months ago Burning with urination   [facility] [place], [place], NP   9 months ago [facility] [place], [place] W, NP   1 year ago Medicare annual wellness visit, subsequent   [facility] [place], [place] W, NP             . sertraline (ZOLOFT) 100 MG tablet [Pharmacy Med Name: SERTRALINE HYDROCHLORIDE 100 MG Tablet] 90 tablet 0    Sig: TAKE 1 TABLET EVERY DAY     Psychiatry:  Antidepressants - SSRI - sertraline Failed - 12/20/2021  3:16 AM      Failed - Completed PHQ-2 or PHQ-9 in the last 360 days      Passed - AST in normal range and within 360 days    AST  Date Value Ref Range Status  09/24/2021 16 10 - 35 U/L Final   SGOT(AST)  Date Value Ref Range Status  02/25/2014 15 15 - 37 Unit/L Final         Passed - ALT in normal range and within 360 days    ALT  Date Value Ref Range Status  09/24/2021 12 6 - 29 U/L Final   SGPT (ALT)  Date Value Ref Range Status  02/25/2014 23 U/L Final    Comment:    14-63 NOTE: New Reference Range 12/04/13          Passed - Valid encounter within last 6 months    Recent Outpatient Visits          2 months ago Encounter for general adult medical  examination with abnormal findings   [facility] [place], Coralie Keens, NP   8 months ago Burning with urination   [facility] [place], Coralie Keens, NP   9 months ago [facility] [place], Coralie Keens, NP   1 year ago Medicare annual wellness visit, subsequent   [facility] [place], [place], NP             . propranolol (INDERAL) 40 MG tablet [Pharmacy Med Name: PROPRANOLOL HCL 40 MG Tablet] 180 tablet 0    Sig: TAKE 1 TABLET TWICE DAILY     Cardiovascular:  Beta Blockers Passed - 12/20/2021  3:16 AM      Passed - Last BP in normal range    BP Readings from Last 1 Encounters:  12/04/21 122/60         Passed - Last Heart Rate in normal range    Pulse Readings from Last 1 Encounters:  12/04/21 70         Passed - Valid encounter within last 6 months    Recent Outpatient Visits          2 months ago Encounter for general adult medical examination with abnormal findings   [place], NP   8 months ago Burning with urination   [facility] [place], Coralie Keens, NP   9 months ago [facility] [place], Coralie Keens, NP   1 year ago Medicare annual wellness visit, subsequent   [facility] [place], Coralie Keens, NP

## 2022-01-12 ENCOUNTER — Ambulatory Visit: Payer: Medicare HMO

## 2022-01-15 ENCOUNTER — Other Ambulatory Visit: Payer: Self-pay | Admitting: Internal Medicine

## 2022-01-19 ENCOUNTER — Other Ambulatory Visit: Payer: Self-pay | Admitting: Internal Medicine

## 2022-01-19 DIAGNOSIS — F419 Anxiety disorder, unspecified: Secondary | ICD-10-CM

## 2022-01-19 NOTE — Telephone Encounter (Signed)
Requested Prescriptions  Pending Prescriptions Disp Refills  . albuterol (VENTOLIN HFA) 108 (90 Base) MCG/ACT inhaler [Pharmacy Med Name: ALBUTEROL SULFATE HFA 108 (90 Base) MCG/ACT Aerosol Solution] 1 each 1    Sig: INHALE 2 PUFFS EVERY 6 HOURS AS NEEDED FOR WHEEZING OR SHORTNESS OF BREATH.     Pulmonology:  Beta Agonists 2 Passed - 01/15/2022 10:18 AM      Passed - Last BP in normal range    BP Readings from Last 1 Encounters:  12/04/21 122/60         Passed - Last Heart Rate in normal range    Pulse Readings from Last 1 Encounters:  12/04/21 70         Passed - Valid encounter within last 12 months    Recent Outpatient Visits          3 months ago Encounter for general adult medical examination with abnormal findings   [place], NP   9 months ago Burning with urination   [facility] [place], Coralie Keens, NP   10 months ago [facility] [place], Coralie Keens, NP   1 year ago Medicare annual wellness visit, subsequent   [facility] [place], Coralie Keens, [place]

## 2022-01-19 NOTE — Telephone Encounter (Unsigned)
Copied from [place] [phone]. Topic: General - Other >> Jan 19, 2022  4:10 PM Everette C wrote: Reason for CRM: Medication Refill - Medication: buPROPion (WELLBUTRIN XL) 300 MG 24 hr tablet [[id number]]   Has the patient contacted their pharmacy? Yes.  The patient has been directed by their pharmacy and told to contact their PCP. The patient was also provided an additional fax number to use if needed (Agent: If no, request that the patient contact the pharmacy for the refill. If patient does not wish to contact the pharmacy document the reason why and proceed with request.) (Agent: If yes, when and what did the pharmacy advise?)  Preferred Pharmacy (with phone number or street name): [place], [place] [address] Phone: [phone] Fax: [phone] Hours: Not open 24 hours   Has the patient been seen for an appointment in the last year OR does the patient have an upcoming appointment? Yes.    Agent: Please be advised that RX refills may take up to 3 business days. We ask that you follow-up with your pharmacy.  The additional fax number is [phone]

## 2022-01-20 NOTE — Telephone Encounter (Signed)
Refilled 12/21/2021 #90 0 refills. Requested Prescriptions  Pending Prescriptions Disp Refills  . buPROPion (WELLBUTRIN XL) 300 MG 24 hr tablet 90 tablet 0    Sig: Take 1 tablet (300 mg total) by mouth daily.     Psychiatry: Antidepressants - bupropion Failed - 01/19/2022  5:25 PM      Failed - Cr in normal range and within 360 days    Creat  Date Value Ref Range Status  09/24/2021 1.00 0.60 - 1.00 mg/dL Final   Creatinine, Ser  Date Value Ref Range Status  10/05/2021 1.11 (H) 0.44 - 1.00 mg/dL Final         Failed - Completed PHQ-2 or PHQ-9 in the last 360 days      Passed - AST in normal range and within 360 days    AST  Date Value Ref Range Status  09/24/2021 16 10 - 35 U/L Final   SGOT(AST)  Date Value Ref Range Status  02/25/2014 15 15 - 37 Unit/L Final         Passed - ALT in normal range and within 360 days    ALT  Date Value Ref Range Status  09/24/2021 12 6 - 29 U/L Final   SGPT (ALT)  Date Value Ref Range Status  02/25/2014 23 U/L Final    Comment:    14-63 NOTE: New Reference Range 12/04/13          Passed - Last BP in normal range    BP Readings from Last 1 Encounters:  12/04/21 122/60         Passed - Valid encounter within last 6 months    Recent Outpatient Visits          3 months ago Encounter for general adult medical examination with abnormal findings   [place], NP   9 months ago Burning with urination   [facility] [place], Coralie Keens, NP   10 months ago [facility] [place], Coralie Keens, NP   1 year ago Medicare annual wellness visit, subsequent   [facility] [place], Coralie Keens, NP

## 2022-01-21 ENCOUNTER — Ambulatory Visit: Payer: Medicare HMO | Attending: Pulmonary Disease

## 2022-01-21 DIAGNOSIS — G4733 Obstructive sleep apnea (adult) (pediatric): Secondary | ICD-10-CM | POA: Diagnosis not present

## 2022-01-21 DIAGNOSIS — G4761 Periodic limb movement disorder: Secondary | ICD-10-CM | POA: Diagnosis not present

## 2022-01-21 DIAGNOSIS — Z6839 Body mass index (BMI) 39.0-39.9, adult: Secondary | ICD-10-CM | POA: Diagnosis not present

## 2022-01-26 ENCOUNTER — Telehealth (HOSPITAL_BASED_OUTPATIENT_CLINIC_OR_DEPARTMENT_OTHER): Payer: Medicare HMO | Admitting: Pulmonary Disease

## 2022-01-26 DIAGNOSIS — G4733 Obstructive sleep apnea (adult) (pediatric): Secondary | ICD-10-CM

## 2022-01-26 NOTE — Telephone Encounter (Signed)
Titration study >> needed BiPAP , sub optimal on 17/13  SUggest try auto BiPAP, EPAP min 8, PS +4, IPAP max 20 woth med airfit F20 mask

## 2022-01-27 ENCOUNTER — Encounter: Payer: Self-pay | Admitting: Internal Medicine

## 2022-01-28 NOTE — Telephone Encounter (Signed)
Titration study shows that patient needs BiPAP. Please set up an office visit or virtual visit to discuss sleep study titration results and treatment plan.  Patient had been seen previously and started on CPAP but was unable to tolerate and turned back in machine.  Want to make sure that we discussed her treatment plan and full before she starts on therapy.

## 2022-02-03 ENCOUNTER — Ambulatory Visit (INDEPENDENT_AMBULATORY_CARE_PROVIDER_SITE_OTHER): Payer: Medicare HMO

## 2022-02-03 DIAGNOSIS — Z23 Encounter for immunization: Secondary | ICD-10-CM

## 2022-02-03 NOTE — Telephone Encounter (Signed)
Patient has a f/u in place on 03/15/22.  Nothing further needed.

## 2022-03-15 ENCOUNTER — Encounter: Payer: Self-pay | Admitting: Adult Health

## 2022-03-15 ENCOUNTER — Ambulatory Visit: Payer: Medicare HMO | Admitting: Adult Health

## 2022-03-15 VITALS — BP 126/80 | HR 78 | Temp 97.7°F | Ht 64.0 in | Wt 237.2 lb

## 2022-03-15 DIAGNOSIS — E6609 Other obesity due to excess calories: Secondary | ICD-10-CM | POA: Insufficient documentation

## 2022-03-15 DIAGNOSIS — G4733 Obstructive sleep apnea (adult) (pediatric): Secondary | ICD-10-CM

## 2022-03-15 NOTE — Assessment & Plan Note (Signed)
Healthy weight loss 

## 2022-03-15 NOTE — Assessment & Plan Note (Signed)
Moderate obstructive sleep apnea with significant symptom burden.  Titration study showed patient would be more optimally controlled on BiPAP.  We will begin BiPAP auto with IPAP max at 20 and EPAP minimum 8 with pressure support of 4.  She wore the medium AirFit F20 mask.   Plan  Patient Instructions  Begin BIPAP At bedtime , wear all night long, for at least 6hr  Work on healthy weight loss Healthy sleep regimen Do not drive if sleepy Follow-up in 3 months and As needed

## 2022-03-15 NOTE — Patient Instructions (Addendum)
Begin BIPAP At bedtime , wear all night long, for at least 6hr  Work on healthy weight loss Healthy sleep regimen Do not drive if sleepy Follow-up in 3 months and As needed

## 2022-03-15 NOTE — Progress Notes (Signed)
$'@Patient'D$  ID: Kristie Byrd, female    DOB: Jul 21, 1945, 76 y.o.   MRN: [id number]  No chief complaint on file.   Referring provider: Jearld Fenton, NP  HPI: 76 yo female seen for sleep consult 06/30/21 found to have moderate OSA   TEST/EVENTS :  home sleep study that was completed on August 02, 2021 that showed moderate sleep apnea with AHI 24.5/hour and SPO2 low at 75%.  03/02/22 Titration study >> needed BiPAP , sub optimal on 17/13  SUggest try auto BiPAP, EPAP min 8, PS +4, IPAP max 20 woth med airfit F20 mask  03/15/2022 Follow up ; OSA  Patient presents for 82-monthfollow-up.  Patient was found to have moderate obstructive sleep apnea.  She was tried on CPAP but unfortunate was unable to tolerate.  We set her up for a CPAP titration study.  Titration study done on March 02, 2022 showed patient required BiPAP .  We discussed her titration study in detail. She is okay to start BIPAP , wants to feel better and be less tired.  Patient continues to have ongoing restless sleep, snoring and daytime sleepiness.  Feels tired all the time   Allergies  Allergen Reactions   Codeine Nausea And Vomiting   Demerol [Meperidine] Nausea And Vomiting   Fleet Phospho-Soda [Sodium Phosphates] Nausea And Vomiting    Vomiting    Latex Rash    Blisters   Other Other (See Comments)    Band-Aid, blister   Prednisone Other (See Comments)    Chest pressure    Immunization History  Administered Date(s) Administered   Fluad Quad(high Dose 65+) 03/21/2019, 03/31/2020, 02/03/2022   Influenza Split 02/15/2013   Influenza, High Dose Seasonal PF 02/02/2014, 01/21/2015, 01/25/2017, 07/15/2018   Influenza, Seasonal, Injecte, Preservative Fre 01/02/2016   Influenza-Unspecified 01/25/2017, 04/17/2021   PFIZER(Purple Top)SARS-COV-2 Vaccination 06/29/2019, 07/20/2019   Pneumococcal Conjugate-13 02/02/2014   Pneumococcal Polysaccharide-23 01/25/2017   Pneumococcal-Unspecified 02/19/2013   Td  11/05/2013   Tdap 02/20/2015   Zoster Recombinat (Shingrix) 01/25/2017   Zoster, Live 12/27/2012    Past Medical History:  Diagnosis Date   Arthritis    Cancer ([place]    cervix, skin   Colon polyps    COPD (chronic obstructive pulmonary disease) (HCC)    Depression    GERD (gastroesophageal reflux disease)    Headache    Hypertension    Iron deficiency anemia 09/27/2012   PONV (postoperative nausea and vomiting)    Shingles     Tobacco History: Social History   Tobacco Use  Smoking Status Former   Packs/day: 1.00   Years: 37.00   Total pack years: 37.00   Types: Cigarettes   Quit date: 11/15/1996   Years since quitting: 25.3  Smokeless Tobacco Never   Counseling given: Not Answered   Outpatient Medications Prior to Visit  Medication Sig Dispense Refill   acetaminophen (TYLENOL) 650 MG CR tablet Take 1,000 mg by mouth every 4 (four) hours as needed.     albuterol (VENTOLIN HFA) 108 (90 Base) MCG/ACT inhaler INHALE 2 PUFFS EVERY 6 HOURS AS NEEDED FOR WHEEZING OR SHORTNESS OF BREATH. 1 each 1   Budeson-Glycopyrrol-Formoterol (BREZTRI AEROSPHERE) 160-9-4.8 MCG/ACT AERO Inhale 2 puffs into the lungs 2 (two) times daily. 10.7 g 11   buPROPion (WELLBUTRIN XL) 300 MG 24 hr tablet TAKE 1 TABLET EVERY DAY 90 tablet 0   calcitRIOL (ROCALTROL) 0.25 MCG capsule Take 0.25 mcg by mouth daily.     Cholecalciferol (VITAMIN D3)  1000 units CAPS Take 1,000 Units by mouth daily.     fluticasone (FLONASE) 50 MCG/ACT nasal spray Place 1 spray into both nostrils as needed for allergies or rhinitis.     lisinopril (ZESTRIL) 5 MG tablet TAKE 1 TABLET EVERY DAY 90 tablet 0   Multiple Vitamin (MULTIVITAMIN) tablet Take 1 tablet by mouth at bedtime.      propranolol (INDERAL) 40 MG tablet TAKE 1 TABLET TWICE DAILY 180 tablet 0   sertraline (ZOLOFT) 100 MG tablet TAKE 1 TABLET EVERY DAY 90 tablet 0   No facility-administered medications prior to visit.     Review of Systems:   Constitutional:    No  weight loss, night sweats,  Fevers, chills, +fatigue, or  lassitude.  HEENT:   No headaches,  Difficulty swallowing,  Tooth/dental problems, or  Sore throat,                No sneezing, itching, ear ache, nasal congestion, post nasal drip,   CV:  No chest pain,  Orthopnea, PND, swelling in lower extremities, anasarca, dizziness, palpitations, syncope.   GI  No heartburn, indigestion, abdominal pain, nausea, vomiting, diarrhea, change in bowel habits, loss of appetite, bloody stools.   Resp: No shortness of breath with exertion or at rest.  No excess mucus, no productive cough,  No non-productive cough,  No coughing up of blood.  No change in color of mucus.  No wheezing.  No chest wall deformity  Skin: no rash or lesions.  GU: no dysuria, change in color of urine, no urgency or frequency.  No flank pain, no hematuria   MS:  No joint pain or swelling.  No decreased range of motion.  No back pain.    Physical Exam  BP 126/80 (BP Location: Left Wrist, Cuff Size: Normal)   Pulse 78   Temp 97.7 F (36.5 C)   Ht '5\' 4"'$  (1.626 m)   Wt 237 lb 3.2 oz (107.6 kg)   SpO2 97%   BMI 40.72 kg/m   GEN: A/Ox3; pleasant , NAD, well nourished    HEENT:  [place]/AT,   NOSE-clear, THROAT-clear, no lesions, no postnasal drip or exudate noted.   NECK:  Supple w/ fair ROM; no JVD; normal carotid impulses w/o bruits; no thyromegaly or nodules palpated; no lymphadenopathy.    RESP  Clear  P & A; w/o, wheezes/ rales/ or rhonchi. no accessory muscle use, no dullness to percussion  CARD:  RRR, no m/r/g, no peripheral edema, pulses intact, no cyanosis or clubbing.  GI:   Soft & nt; nml bowel sounds; no organomegaly or masses detected.   Musco: Warm bil, no deformities or joint swelling noted.   Neuro: alert, no focal deficits noted.    Skin: Warm, no lesions or rashes    Lab Results:  CBC    Component Value Date/Time   WBC 5.2 10/05/2021 1232   RBC 4.05 10/05/2021 1232   HGB 12.0  10/05/2021 1232   HGB 11.4 (L) 02/25/2014 1404   HCT 37.3 10/05/2021 1232   HCT 34.9 (L) 02/25/2014 1404   PLT 220 10/05/2021 1232   PLT 256 02/25/2014 1404   MCV 92.1 10/05/2021 1232   MCV 89 02/25/2014 1404   MCH 29.6 10/05/2021 1232   MCHC 32.2 10/05/2021 1232   RDW 13.5 10/05/2021 1232   RDW 14.7 (H) 02/25/2014 1404   LYMPHSABS 1.1 10/05/2021 1232   LYMPHSABS 1.1 02/25/2014 1404   MONOABS 0.6 10/05/2021 1232   MONOABS 0.5  02/25/2014 1404   EOSABS 0.0 10/05/2021 1232   EOSABS 0.0 02/25/2014 1404   BASOSABS 0.0 10/05/2021 1232   BASOSABS 0.1 02/25/2014 1404    BMET    Component Value Date/Time   NA 137 10/05/2021 1232   NA 137 02/25/2014 1404   K 4.4 10/05/2021 1232   K 4.2 02/25/2014 1404   CL 97 (L) 10/05/2021 1232   CL 102 02/25/2014 1404   CO2 32 10/05/2021 1232   CO2 28 02/25/2014 1404   GLUCOSE 94 10/05/2021 1232   GLUCOSE 124 (H) 02/25/2014 1404   BUN 18 10/05/2021 1232   BUN 20 02/10/2017 0000   BUN 24 (H) 02/25/2014 1404   CREATININE 1.11 (H) 10/05/2021 1232   CREATININE 1.00 09/24/2021 1514   CALCIUM 9.1 10/05/2021 1232   CALCIUM 9.7 04/06/2021 1322   GFRNONAA 52 (L) 10/05/2021 1232   GFRNONAA 44 (L) 02/25/2014 1404   GFRNONAA 45 (L) 10/26/2013 1330   GFRAA 56 (L) 10/03/2019 1324   GFRAA 54 (L) 02/25/2014 1404   GFRAA 52 (L) 10/26/2013 1330    BNP No results found for: "BNP"  ProBNP No results found for: "PROBNP"  Imaging: SLEEP STUDY DOCUMENTS  Result Date: 03/02/2022 Ordered by an unspecified provider.  SLEEP STUDY DOCUMENTS  Result Date: 03/02/2022 Ordered by an unspecified provider.         No data to display          No results found for: "NITRICOXIDE"      Assessment & Plan:   OSA (obstructive sleep apnea) Moderate obstructive sleep apnea with significant symptom burden.  Titration study showed patient would be more optimally controlled on BiPAP.  We will begin BiPAP auto with IPAP max at 20 and EPAP minimum 8 with  pressure support of 4.  She wore the medium AirFit F20 mask.   Plan  Patient Instructions  Begin BIPAP At bedtime , wear all night long, for at least 6hr  Work on healthy weight loss Healthy sleep regimen Do not drive if sleepy Follow-up in 3 months and As needed       Morbid obesity ([place]) Healthy weight loss      Rexene Edison, NP 03/15/2022

## 2022-04-01 DIAGNOSIS — H524 Presbyopia: Secondary | ICD-10-CM | POA: Diagnosis not present

## 2022-04-01 DIAGNOSIS — H353131 Nonexudative age-related macular degeneration, bilateral, early dry stage: Secondary | ICD-10-CM | POA: Diagnosis not present

## 2022-04-16 MED FILL — Iron Sucrose Inj 20 MG/ML (Fe Equiv): INTRAVENOUS | Qty: 10 | Status: AC

## 2022-04-19 ENCOUNTER — Inpatient Hospital Stay: Payer: Medicare HMO | Admitting: Internal Medicine

## 2022-04-19 ENCOUNTER — Inpatient Hospital Stay: Payer: Medicare HMO | Attending: Internal Medicine

## 2022-04-19 ENCOUNTER — Encounter: Payer: Self-pay | Admitting: Internal Medicine

## 2022-04-19 ENCOUNTER — Inpatient Hospital Stay: Payer: Medicare HMO

## 2022-04-19 VITALS — BP 121/66 | HR 74 | Temp 96.6°F | Resp 19 | Wt 235.4 lb

## 2022-04-19 DIAGNOSIS — D631 Anemia in chronic kidney disease: Secondary | ICD-10-CM | POA: Insufficient documentation

## 2022-04-19 DIAGNOSIS — N183 Chronic kidney disease, stage 3 unspecified: Secondary | ICD-10-CM

## 2022-04-19 DIAGNOSIS — Z79899 Other long term (current) drug therapy: Secondary | ICD-10-CM | POA: Diagnosis not present

## 2022-04-19 LAB — CBC WITH DIFFERENTIAL/PLATELET
Abs Immature Granulocytes: 0.02 10*3/uL (ref 0.00–0.07)
Basophils Absolute: 0 10*3/uL (ref 0.0–0.1)
Basophils Relative: 1 %
Eosinophils Absolute: 0 10*3/uL (ref 0.0–0.5)
Eosinophils Relative: 0 %
HCT: 38.1 % (ref 36.0–46.0)
Hemoglobin: 12.2 g/dL (ref 12.0–15.0)
Immature Granulocytes: 0 %
Lymphocytes Relative: 18 %
Lymphs Abs: 1 10*3/uL (ref 0.7–4.0)
MCH: 30 pg (ref 26.0–34.0)
MCHC: 32 g/dL (ref 30.0–36.0)
MCV: 93.6 fL (ref 80.0–100.0)
Monocytes Absolute: 0.5 10*3/uL (ref 0.1–1.0)
Monocytes Relative: 10 %
Neutro Abs: 4.1 10*3/uL (ref 1.7–7.7)
Neutrophils Relative %: 71 %
Platelets: 252 10*3/uL (ref 150–400)
RBC: 4.07 MIL/uL (ref 3.87–5.11)
RDW: 13.6 % (ref 11.5–15.5)
WBC: 5.7 10*3/uL (ref 4.0–10.5)
nRBC: 0 % (ref 0.0–0.2)

## 2022-04-19 LAB — BASIC METABOLIC PANEL
Anion gap: 8 (ref 5–15)
BUN: 25 mg/dL — ABNORMAL HIGH (ref 8–23)
CO2: 30 mmol/L (ref 22–32)
Calcium: 9.3 mg/dL (ref 8.9–10.3)
Chloride: 100 mmol/L (ref 98–111)
Creatinine, Ser: 1.38 mg/dL — ABNORMAL HIGH (ref 0.44–1.00)
GFR, Estimated: 40 mL/min — ABNORMAL LOW (ref >60)
Glucose, Bld: 112 mg/dL — ABNORMAL HIGH (ref 70–99)
Potassium: 4.7 mmol/L (ref 3.5–5.1)
Sodium: 138 mmol/L (ref 135–145)

## 2022-04-19 LAB — IRON AND TIBC
Iron: 81 ug/dL (ref 28–170)
Saturation Ratios: 19 % (ref 10.4–31.8)
TIBC: 419 ug/dL (ref 250–450)
UIBC: 338 ug/dL

## 2022-04-19 LAB — FERRITIN: Ferritin: 22 ng/mL (ref 11–307)

## 2022-04-19 NOTE — Progress Notes (Signed)
Patient has no concerns 

## 2022-04-19 NOTE — Progress Notes (Signed)
[facility] NOTE  Patient Care Team: Jearld Fenton, NP as PCP - General (Internal Medicine) Juanito Doom, MD as Consulting Physician (Pulmonary Disease) Forest Gleason, MD as Consulting Physician (Oncology) Gatha Mayer, MD as Consulting Physician (Gastroenterology) Leandrew Koyanagi, MD as Referring Physician (Ophthalmology) Cammie Sickle, MD as Consulting Physician (Oncology)  CHIEF COMPLAINTS/PURPOSE OF CONSULTATION: Anemia  # ANEMIA- sec to IDA/CKD [Dr.Lateef-hemoglobin 11.5/iron saturation 13% ferritin 30; intolerant to p.o. iron.]# colonoscopy-~ 2016; [not until 2024]; STOPPED Aurexa [JAN 2023]  # CKD stage III  # Hx of smoking quit  Oncology History   No history exists.     HISTORY OF PRESENTING ILLNESS: Walks with a rolling walker because of back pain.  Alone. Kristie Byrd 76 y.o.  female longstanding history of chronic kidney disease stage III/ongoing anemia is here for a follow up.  Patient has no concerns. Patient compliant with her PO iron.   No nausea no vomiting. No blood in stools or black or stools.   Review of Systems  Constitutional:  Positive for malaise/fatigue. Negative for chills, diaphoresis, fever and weight loss.  HENT:  Negative for nosebleeds and sore throat.   Eyes:  Negative for double vision.  Respiratory:  Negative for cough, hemoptysis, sputum production, shortness of breath and wheezing.   Cardiovascular:  Negative for chest pain, palpitations, orthopnea and leg swelling.  Gastrointestinal:  Positive for constipation. Negative for abdominal pain, blood in stool, diarrhea, heartburn, melena, nausea and vomiting.  Genitourinary:  Negative for dysuria, frequency and urgency.  Musculoskeletal:  Negative for back pain and joint pain.  Skin: Negative.  Negative for itching and rash.  Neurological:  Negative for dizziness, tingling, focal weakness, weakness and headaches.  Endo/Heme/Allergies:  Does not  bruise/bleed easily.  Psychiatric/Behavioral:  Negative for depression. The patient is not nervous/anxious and does not have insomnia.      MEDICAL HISTORY:  Past Medical History:  Diagnosis Date   Arthritis    Cancer ([place])    cervix, skin   Colon polyps    COPD (chronic obstructive pulmonary disease) (HCC)    Depression    GERD (gastroesophageal reflux disease)    Headache    Hypertension    Iron deficiency anemia 09/27/2012   PONV (postoperative nausea and vomiting)    Shingles     SURGICAL HISTORY: Past Surgical History:  Procedure Laterality Date   BREAST BIOPSY Bilateral    neg   CARPAL TUNNEL RELEASE Bilateral    COLONOSCOPY  multiple   KNEE ARTHROPLASTY Right 05/12/2016   Procedure: COMPUTER ASSISTED TOTAL KNEE ARTHROPLASTY;  Surgeon: Dereck Leep, MD;  Location: [facility] ORS;  Service: Orthopedics;  Laterality: Right;   KNEE SURGERY Right    NOSE SURGERY     TONSILLECTOMY      SOCIAL HISTORY: Social History   Socioeconomic History   Marital status: Divorced    Spouse name: Not on file   Number of children: 2   Years of education: Not on file   Highest education level: Not on file  Occupational History   Occupation: retired  Tobacco Use   Smoking status: Former    Packs/day: 1.00    Years: 37.00    Total pack years: 37.00    Types: Cigarettes    Quit date: 11/15/1996    Years since quitting: 25.4   Smokeless tobacco: Never  Vaping Use   Vaping Use: Never used  Substance and Sexual Activity   Alcohol use: No  Alcohol/week: 0.0 standard drinks of alcohol   Drug use: No    Comment: uses CBD oil    Sexual activity: Never  Other Topics Concern   Not on file  Social History Narrative   Lives alone.  Divorced. Two daughters- in 39s, 3 grandchildren.   Does not know family history- raised in [facility].        Desires CPR.  Does not have HPOA.   She would possibly want life support if for short period of time . Unsure about feeding tube.    Social Determinants of Health   Financial Resource Strain: Low Risk  (09/24/2021)   Overall Financial Resource Strain (CARDIA)    Difficulty of Paying Living Expenses: Not very hard  Food Insecurity: No Food Insecurity (09/24/2021)   Hunger Vital Sign    Worried About Running Out of Food in the Last Year: Never true    Ran Out of Food in the Last Year: Never true  Transportation Needs: No Transportation Needs (09/24/2021)   PRAPARE - Hydrologist (Medical): No    Lack of Transportation (Non-Medical): No  Physical Activity: Inactive (09/24/2021)   Exercise Vital Sign    Days of Exercise per Week: 0 days    Minutes of Exercise per Session: 0 min  Stress: No Stress Concern Present (09/24/2021)   [place]    Feeling of Stress : Not at all  Social Connections: Socially Isolated (09/24/2021)   Social Connection and Isolation Panel [NHANES]    Frequency of Communication with Friends and Family: More than three times a week    Frequency of Social Gatherings with Friends and Family: Never    Attends Religious Services: Never    Marine scientist or Organizations: No    Attends Archivist Meetings: Never    Marital Status: Divorced  Human resources officer Violence: Not At Risk (09/24/2021)   Humiliation, Afraid, Rape, and Kick questionnaire    Fear of Current or Ex-Partner: No    Emotionally Abused: No    Physically Abused: No    Sexually Abused: No    FAMILY HISTORY: Family History  Problem Relation Age of Onset   Colon cancer Daughter     ALLERGIES:  is allergic to codeine, demerol [meperidine], fleet phospho-soda [sodium phosphates], latex, other, and prednisone.  MEDICATIONS:  Current Outpatient Medications  Medication Sig Dispense Refill   acetaminophen (TYLENOL) 650 MG CR tablet Take 1,000 mg by mouth every 4 (four) hours as needed.     albuterol (VENTOLIN HFA) 108 (90  Base) MCG/ACT inhaler INHALE 2 PUFFS EVERY 6 HOURS AS NEEDED FOR WHEEZING OR SHORTNESS OF BREATH. 1 each 1   Budeson-Glycopyrrol-Formoterol (BREZTRI AEROSPHERE) 160-9-4.8 MCG/ACT AERO Inhale 2 puffs into the lungs 2 (two) times daily. 10.7 g 11   buPROPion (WELLBUTRIN XL) 300 MG 24 hr tablet TAKE 1 TABLET EVERY DAY 90 tablet 0   calcitRIOL (ROCALTROL) 0.25 MCG capsule Take 0.25 mcg by mouth daily.     Cholecalciferol (VITAMIN D3) 1000 units CAPS Take 1,000 Units by mouth daily.     ferrous sulfate 325 (65 FE) MG EC tablet Take 325 mg by mouth 3 (three) times daily with meals.     fluticasone (FLONASE) 50 MCG/ACT nasal spray Place 1 spray into both nostrils as needed for allergies or rhinitis.     lisinopril (ZESTRIL) 5 MG tablet TAKE 1 TABLET EVERY DAY 90 tablet 0  Multiple Vitamin (MULTIVITAMIN) tablet Take 1 tablet by mouth at bedtime.      propranolol (INDERAL) 40 MG tablet TAKE 1 TABLET TWICE DAILY 180 tablet 0   sertraline (ZOLOFT) 100 MG tablet TAKE 1 TABLET EVERY DAY 90 tablet 0   No current facility-administered medications for this visit.      Marland Kitchen  PHYSICAL EXAMINATION: ECOG PERFORMANCE STATUS: 1 - Symptomatic but completely ambulatory  Vitals:   04/19/22 1314  BP: 121/66  Pulse: 74  Resp: 19  Temp: (!) 96.6 F (35.9 C)  SpO2: 94%   Filed Weights   04/19/22 1314  Weight: 235 lb 6.4 oz (106.8 kg)    Physical Exam HENT:     Head: Normocephalic and atraumatic.     Mouth/Throat:     Pharynx: No oropharyngeal exudate.  Eyes:     Pupils: Pupils are equal, round, and reactive to light.  Cardiovascular:     Rate and Rhythm: Normal rate and regular rhythm.  Pulmonary:     Effort: No respiratory distress.     Breath sounds: No wheezing.  Abdominal:     General: Bowel sounds are normal. There is no distension.     Palpations: Abdomen is soft. There is no mass.     Tenderness: There is no abdominal tenderness. There is no guarding or rebound.  Musculoskeletal:         General: No tenderness. Normal range of motion.     Cervical back: Normal range of motion and neck supple.  Skin:    General: Skin is warm.  Neurological:     Mental Status: She is alert and oriented to person, place, and time.  Psychiatric:        Mood and Affect: Affect normal.      LABORATORY DATA:  I have reviewed the data as listed Lab Results  Component Value Date   WBC 5.7 04/19/2022   HGB 12.2 04/19/2022   HCT 38.1 04/19/2022   MCV 93.6 04/19/2022   PLT 252 04/19/2022   Recent Labs    09/24/21 1514 10/05/21 1232 04/19/22 1255  NA 139 137 138  K 4.6 4.4 4.7  CL 100 97* 100  CO2 32 32 30  GLUCOSE 85 94 112*  BUN 18 18 25*  CREATININE 1.00 1.11* 1.38*  CALCIUM 9.7 9.1 9.3  GFRNONAA  --  52* 40*  PROT 6.5  --   --   AST 16  --   --   ALT 12  --   --   BILITOT 0.3  --   --     RADIOGRAPHIC STUDIES: I have personally reviewed the radiological images as listed and agreed with the findings in the report. No results found.  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  Iron tablets: not on PO iron sec to lack of sample s[Aurxya from neprhology]. Recommend gentle iron 1 pill a day; should not upset the stomach or cause constipation.     # CKD- stage III [Dr.Lateef]-GFR 52  Followed by nephrology.  # DISPOSITION: #  HOLD venofer # follow up in 1st week dec-labs/cbc/bmpiron studies/fertitin-/possible venofer-Dr.B   All questions were answered. The patient knows to call the clinic with any problems, questions or concerns.    Cammie Sickle, MD 04/19/2022 1:44 PM

## 2022-04-19 NOTE — Assessment & Plan Note (Addendum)
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  Iron tablets: not on PO iron sec to lack of sample s[Aurxya from neprhology]. Continue gentle iron 1 pill a day;- Hb 12.2; HOLD venofer.   # CKD- stage III [Dr.Lateef-X]-GFR 40-   STABLE.   # DISPOSITION: #  HOLD venofer # follow up in 6 Months- labs/cbc/bmpiron studies/fertitin-/possible venofer-Dr.B

## 2022-04-19 NOTE — Progress Notes (Signed)
Per MD no Iron today

## 2022-05-04 DIAGNOSIS — H2511 Age-related nuclear cataract, right eye: Secondary | ICD-10-CM | POA: Diagnosis not present

## 2022-05-06 ENCOUNTER — Encounter: Payer: Self-pay | Admitting: Ophthalmology

## 2022-05-07 ENCOUNTER — Encounter: Payer: Self-pay | Admitting: Internal Medicine

## 2022-05-07 ENCOUNTER — Ambulatory Visit: Payer: Self-pay

## 2022-05-07 ENCOUNTER — Ambulatory Visit (INDEPENDENT_AMBULATORY_CARE_PROVIDER_SITE_OTHER): Payer: Medicare HMO | Admitting: Internal Medicine

## 2022-05-07 VITALS — BP 128/82 | HR 84 | Ht 64.0 in | Wt 230.0 lb

## 2022-05-07 DIAGNOSIS — R82998 Other abnormal findings in urine: Secondary | ICD-10-CM | POA: Diagnosis not present

## 2022-05-07 DIAGNOSIS — R8290 Unspecified abnormal findings in urine: Secondary | ICD-10-CM | POA: Diagnosis not present

## 2022-05-07 LAB — POCT URINALYSIS DIPSTICK
Bilirubin, UA: NEGATIVE
Blood, UA: NEGATIVE
Glucose, UA: NEGATIVE
Ketones, UA: NEGATIVE
Nitrite, UA: NEGATIVE
Protein, UA: NEGATIVE
Spec Grav, UA: 1.01 (ref 1.010–1.025)
Urobilinogen, UA: 0.2 E.U./dL
pH, UA: 7.5 (ref 5.0–8.0)

## 2022-05-07 NOTE — Telephone Encounter (Signed)
Patient called, left VM to return the call to the office to discuss symptoms with a nurse.  Summary: dark urine w/ a smell   Pt states she has dark urine w/ a smell  Pt denies any other symptoms  Pt inquiring if she can drop off a urine sample today  Please assist further

## 2022-05-07 NOTE — Telephone Encounter (Signed)
Pt reached out to [employer] and said she has appt today at 1100 with PCP that pt will be there. No further triage needed.

## 2022-05-07 NOTE — Progress Notes (Signed)
HPI  Pt presents to the clinic today with c/o dark urine and urine odor.  This is an intermittent issue.  She denies urinary urgency, frequency, dysuria, blood in her urine, vaginal irritation, vaginal odor, abnormal vaginal bleeding.  She denies fever, chills, nausea, vomiting or worsening low back pain.  She has not tried anything OTC for this.   Review of Systems  Past Medical History:  Diagnosis Date   Arthritis    Cancer ([place])    cervix, skin   CKD (chronic kidney disease), stage III (HCC)    Colon polyps    COPD (chronic obstructive pulmonary disease) (HCC)    Depression    GERD (gastroesophageal reflux disease)    Headache    Hypertension    Iron deficiency anemia 09/27/2012   PONV (postoperative nausea and vomiting)    Shingles    Sleep apnea    starting CPAP in 2024   Uses walker    or cane   Wears dentures    full upper and lower   Wears hearing aid in right ear     Family History  Problem Relation Age of Onset   Colon cancer Daughter     Social History   Socioeconomic History   Marital status: Divorced    Spouse name: Not on file   Number of children: 2   Years of education: Not on file   Highest education level: Not on file  Occupational History   Occupation: retired  Tobacco Use   Smoking status: Former    Packs/day: 1.00    Years: 37.00    Total pack years: 37.00    Types: Cigarettes    Quit date: 11/15/1996    Years since quitting: 25.4   Smokeless tobacco: Never  Vaping Use   Vaping Use: Never used  Substance and Sexual Activity   Alcohol use: No    Alcohol/week: 0.0 standard drinks of alcohol   Drug use: No    Comment: uses CBD oil    Sexual activity: Never  Other Topics Concern   Not on file  Social History Narrative   Lives alone.  Divorced. Two daughters- in 15s, 3 grandchildren.   Does not know family history- raised in [facility].        Desires CPR.  Does not have HPOA.   She would possibly want life support if for  short period of time . Unsure about feeding tube.   Social Determinants of Health   Financial Resource Strain: Low Risk  (09/24/2021)   Overall Financial Resource Strain (CARDIA)    Difficulty of Paying Living Expenses: Not very hard  Food Insecurity: No Food Insecurity (09/24/2021)   Hunger Vital Sign    Worried About Running Out of Food in the Last Year: Never true    Ran Out of Food in the Last Year: Never true  Transportation Needs: No Transportation Needs (09/24/2021)   PRAPARE - Hydrologist (Medical): No    Lack of Transportation (Non-Medical): No  Physical Activity: Inactive (09/24/2021)   Exercise Vital Sign    Days of Exercise per Week: 0 days    Minutes of Exercise per Session: 0 min  Stress: No Stress Concern Present (09/24/2021)   [place]    Feeling of Stress : Not at all  Social Connections: Socially Isolated (09/24/2021)   Social Connection and Isolation Panel [NHANES]    Frequency of Communication with  Friends and Family: More than three times a week    Frequency of Social Gatherings with Friends and Family: Never    Attends Religious Services: Never    Marine scientist or Organizations: No    Attends Archivist Meetings: Never    Marital Status: Divorced  Human resources officer Violence: Not At Risk (09/24/2021)   Humiliation, Afraid, Rape, and Kick questionnaire    Fear of Current or Ex-Partner: No    Emotionally Abused: No    Physically Abused: No    Sexually Abused: No    Allergies  Allergen Reactions   Codeine Nausea And Vomiting   Demerol [Meperidine] Nausea And Vomiting   Fleet Phospho-Soda [Sodium Phosphates] Nausea And Vomiting    Vomiting    Latex Rash    Blisters   Other Other (See Comments)    Band-Aid, blister   Prednisone Other (See Comments)    Chest pressure     Constitutional: Denies fever, malaise, fatigue, headache or abrupt weight  changes.   GU: Pt reports dark urine and urine odor.  Denies urgency, frequency, dysuria, burning sensation, blood in urine, or discharge. Skin: Denies redness, rashes, lesions or ulcercations.   No other specific complaints in a complete review of systems (except as listed in HPI above).    Objective:   Physical Exam  BP 128/82   Pulse 84   Ht '5\' 4"'$  (1.626 m)   Wt 230 lb (104.3 kg)   SpO2 95%   BMI 39.48 kg/m   Wt Readings from Last 3 Encounters:  04/19/22 235 lb 6.4 oz (106.8 kg)  03/15/22 237 lb 3.2 oz (107.6 kg)  12/04/21 232 lb 9.6 oz (105.5 kg)    General: Appears her stated age, well developed, well nourished in NAD. Cardiovascular: Normal rate and rhythm. S1,S2 noted.   Pulmonary/Chest: Normal effort and positive vesicular breath sounds. No respiratory distress. No wheezes, rales or ronchi noted.  Abdomen: Soft. Normal bowel sounds. No distention or masses noted.  Tender to palpation over the bladder area. No CVA tenderness.        Assessment & Plan:   Dark Urine, Urine Odor  Urinalysis: Small leuks Will send urine culture Drink plenty of fluids  Schedule follow-up of conditions Webb Silversmith, NP

## 2022-05-08 LAB — URINE CULTURE
MICRO NUMBER:: 14350925
SPECIMEN QUALITY:: ADEQUATE

## 2022-05-13 IMAGING — MG MM DIGITAL SCREENING BILAT W/ TOMO AND CAD
8 series · 8 of 24 positions shown · non-contrast
Comparison: Previous exam(s).

CLINICAL DATA: Screening.

EXAM:
DIGITAL SCREENING BILATERAL MAMMOGRAM WITH TOMOSYNTHESIS AND CAD
TECHNIQUE: Bilateral screening digital craniocaudal and mediolateral oblique
mammograms were obtained. Bilateral screening digital breast
tomosynthesis was performed. The images were evaluated with
computer-aided detection.

[R MLO synth-2D]
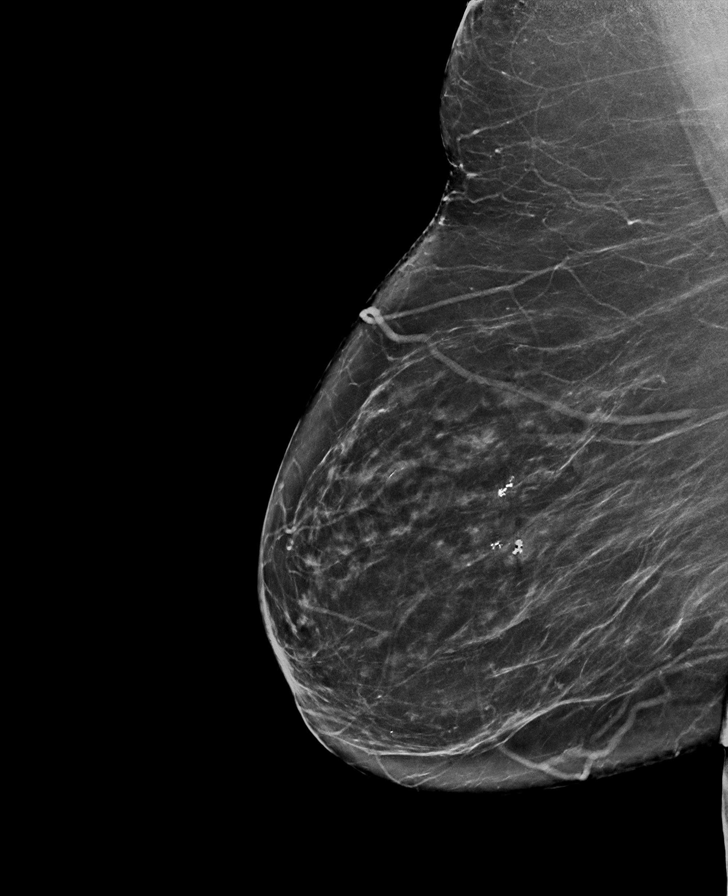

[R CC synth-2D]
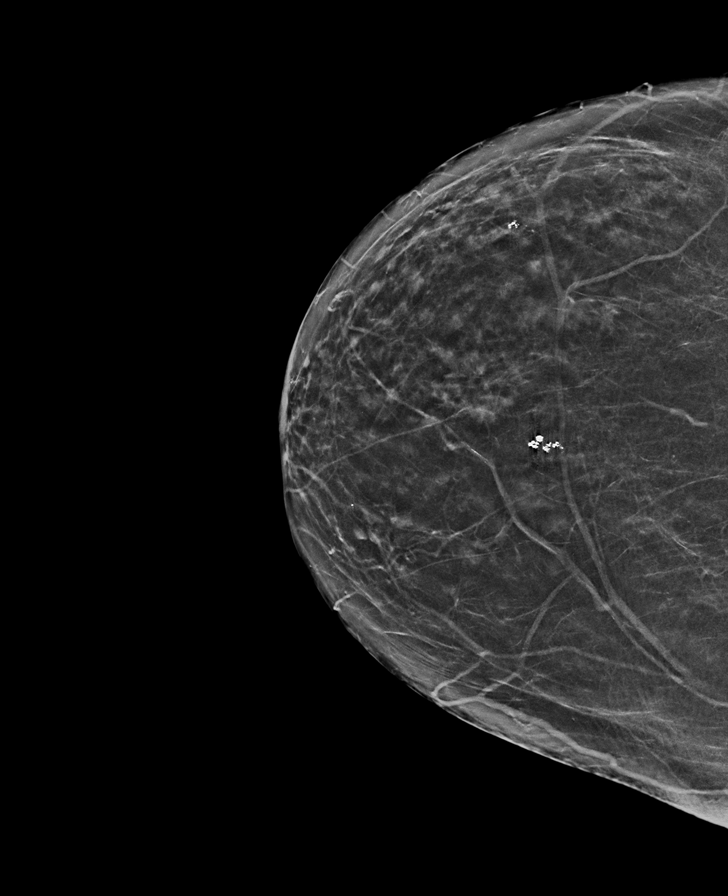

[L MLO synth-2D]
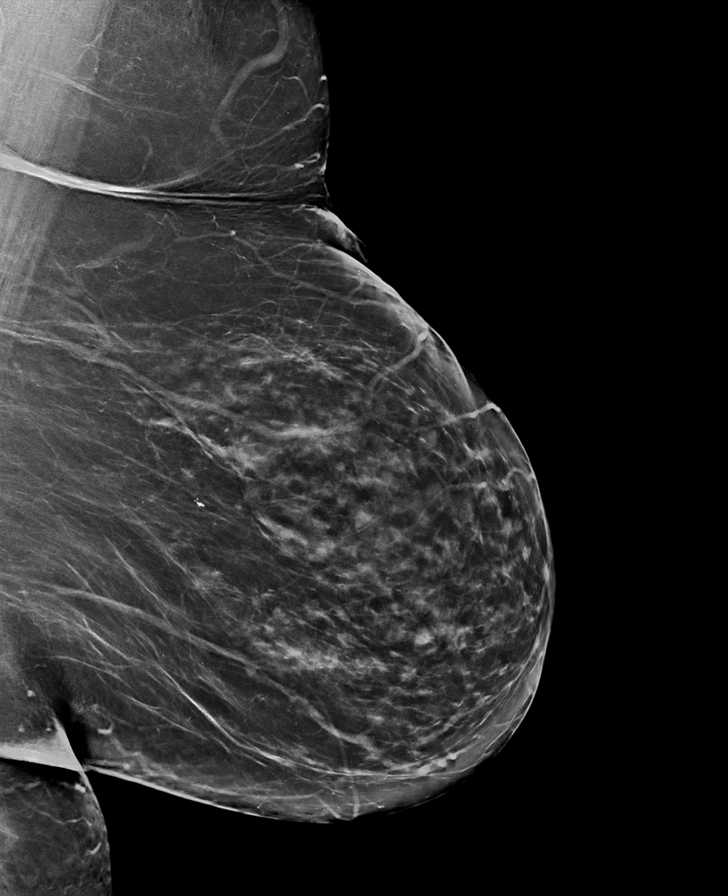

[L CC synth-2D]
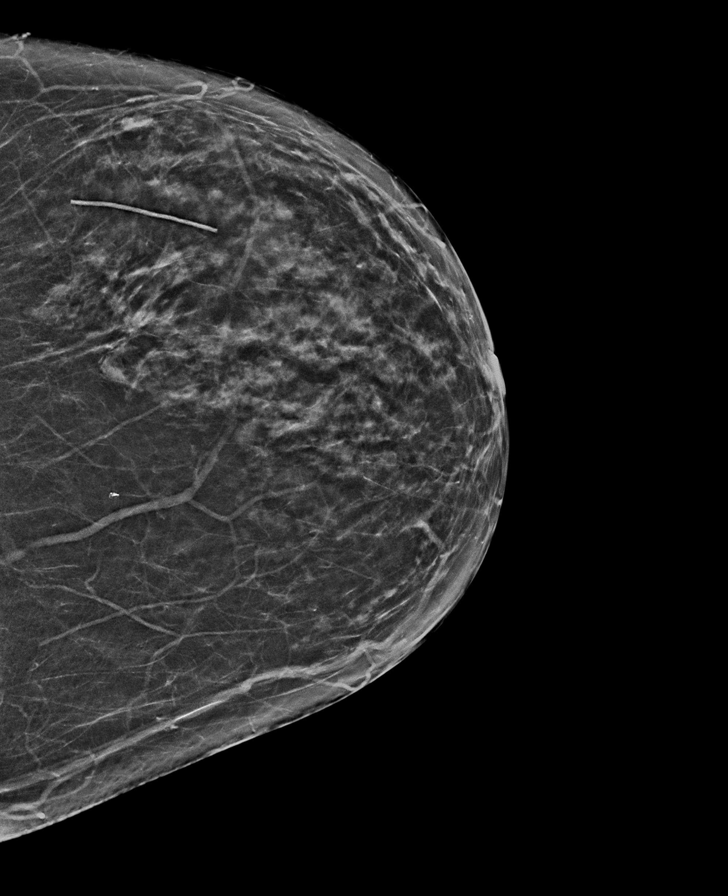

[R MLO tomo · tomo slice 39/77.0]
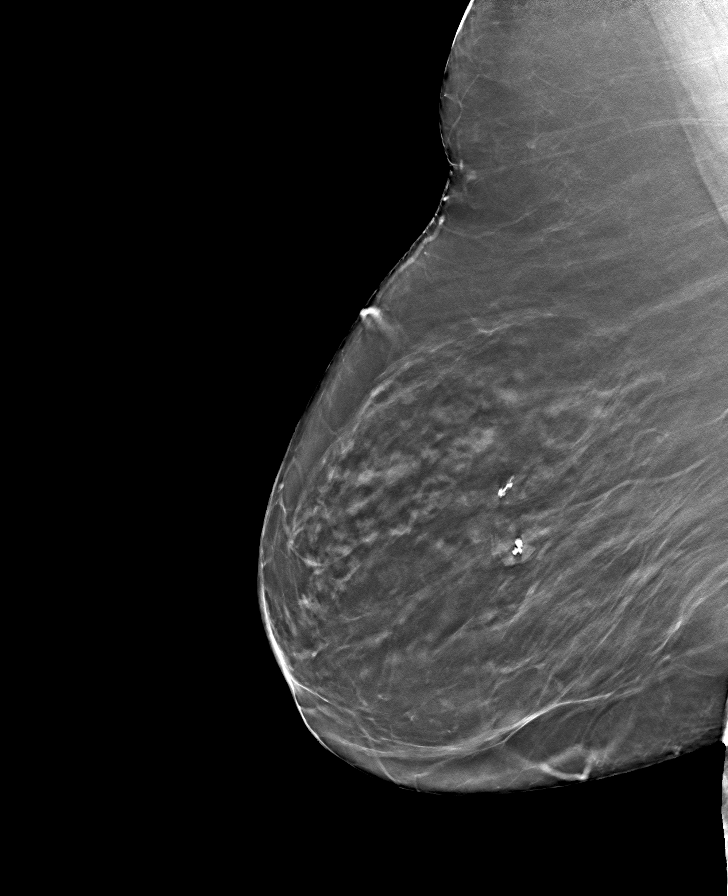

[L MLO tomo · tomo slice 41/80.0]
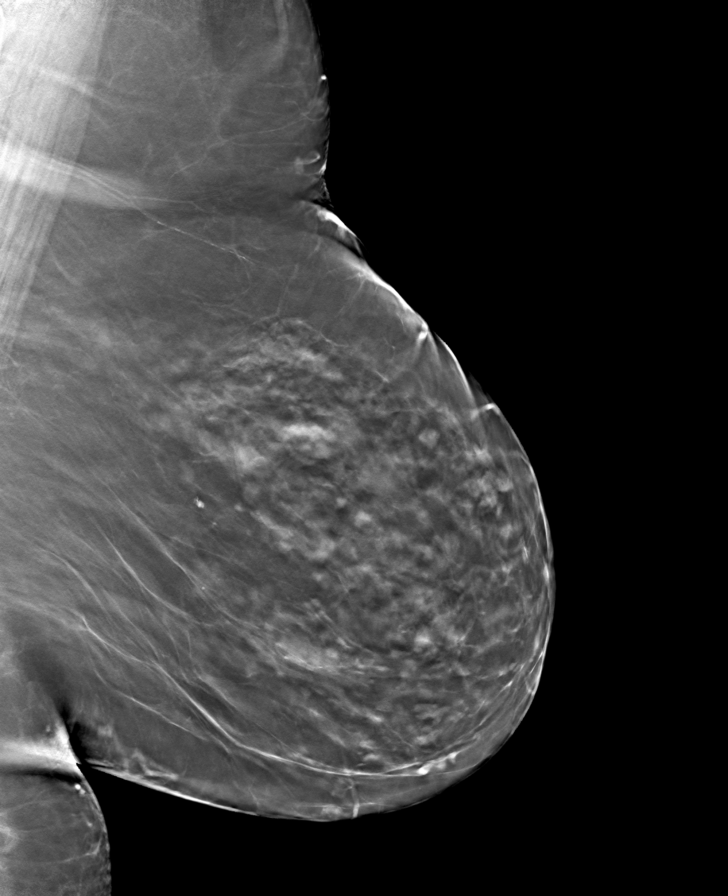

[R CC tomo · tomo slice 31/62.0]
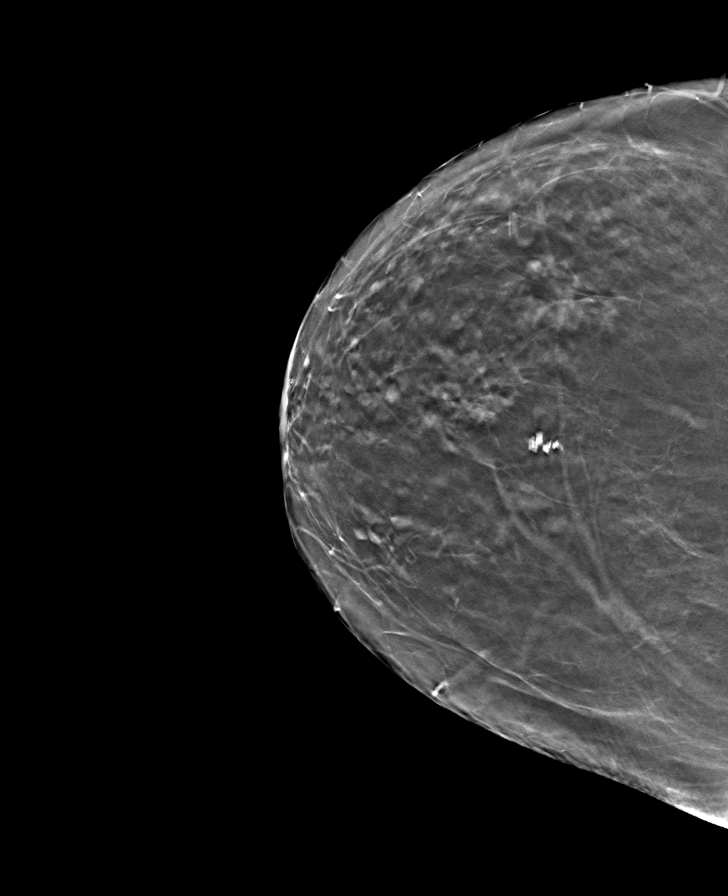

[L CC tomo · tomo slice 30/59.0]
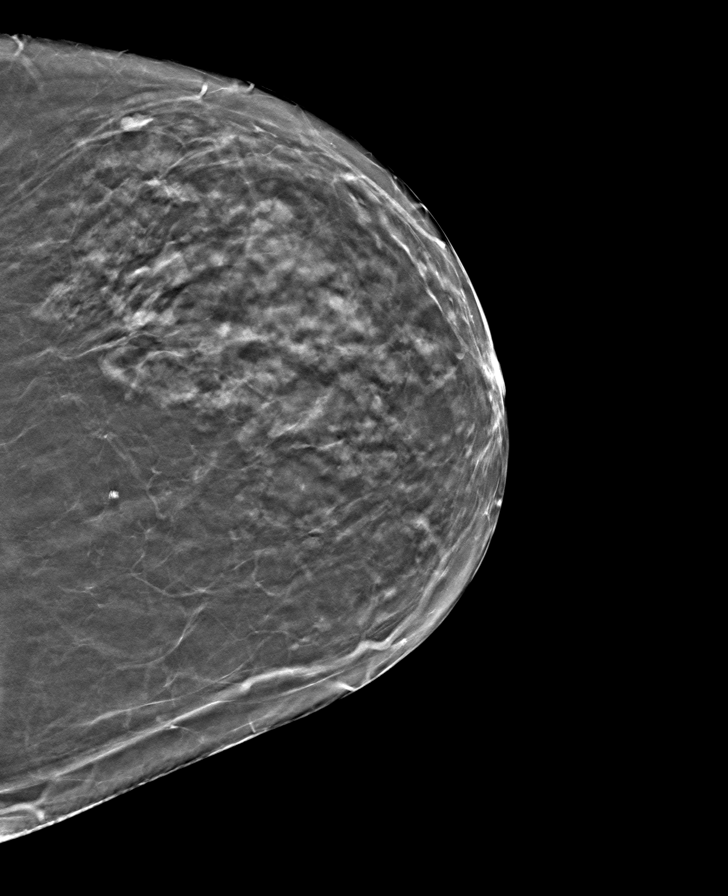

[8 of 24 positions shown; findings below may reference images not displayed]

ACR Breast Density Category b: There are scattered areas of
fibroglandular density.
FINDINGS: There are no findings suspicious for malignancy. The images were
evaluated with computer-aided detection.
IMPRESSION: No mammographic evidence of malignancy. A result letter of this
screening mammogram will be mailed directly to the patient.

RECOMMENDATION:
Screening mammogram in one year. (Code:[id number])

BI-RADS CATEGORY  1: Negative.

## 2022-05-13 NOTE — Discharge Instructions (Signed)

## 2022-05-14 ENCOUNTER — Other Ambulatory Visit: Payer: Self-pay | Admitting: Internal Medicine

## 2022-05-14 DIAGNOSIS — F419 Anxiety disorder, unspecified: Secondary | ICD-10-CM

## 2022-05-14 DIAGNOSIS — I10 Essential (primary) hypertension: Secondary | ICD-10-CM

## 2022-05-14 DIAGNOSIS — F32A Depression, unspecified: Secondary | ICD-10-CM

## 2022-05-14 DIAGNOSIS — R251 Tremor, unspecified: Secondary | ICD-10-CM

## 2022-05-15 NOTE — Telephone Encounter (Signed)
Requested medication (s) are due for refill today: yes  Requested medication (s) are on the active medication list: yes  Last refill:  sertraline: 12/21/21 #90           Propanolol: 12/21/21 #180                       Bupropion: 12/21/21 #90  Future visit scheduled: yes  Notes to clinic:  med not addressed at visit 7 months ago   Requested Prescriptions  Pending Prescriptions Disp Refills   sertraline (ZOLOFT) 100 MG tablet [Pharmacy Med Name: SERTRALINE HYDROCHLORIDE 100 MG Tablet] 90 tablet     Sig: TAKE [place]     Psychiatry:  Antidepressants - SSRI - sertraline Failed - 05/14/2022  1:12 PM      Failed - Valid encounter within last 6 months    Recent Outpatient Visits           1 week ago Dark urine   [place], Coralie Keens, NP   7 months ago Encounter for general adult medical examination with abnormal findings   [facility], Coralie Keens, NP   1 year ago Burning with urination   [facility], Coralie Keens, NP   1 year ago [facility] [place], Coralie Keens, NP   1 year ago Medicare annual wellness visit, subsequent   [facility] [place], Coralie Keens, NP       Future Appointments             In 3 weeks [place], Coralie Keens, NP [facility] [place] - AST in normal range and within 360 days    AST  Date Value Ref Range Status  09/24/2021 16 10 - 35 U/L Final   SGOT(AST)  Date Value Ref Range Status  02/25/2014 15 15 - 37 Unit/L Final         Passed - ALT in normal range and within 360 days    ALT  Date Value Ref Range Status  09/24/2021 12 6 - 29 U/L Final   SGPT (ALT)  Date Value Ref Range Status  02/25/2014 23 U/L Final    Comment:    14-63 NOTE: New Reference Range 12/04/13          Passed - Completed PHQ-2 or PHQ-9 in the last 360 days       propranolol (INDERAL) 40 MG tablet [Pharmacy Med Name: PROPRANOLOL HYDROCHLORIDE 40  MG Tablet] 180 tablet     Sig: TAKE 1 TABLET TWICE DAILY     Cardiovascular:  Beta Blockers Failed - 05/14/2022  1:12 PM      Failed - Valid encounter within last 6 months    Recent Outpatient Visits           1 week ago Dark urine   [place], Coralie Keens, NP   7 months ago Encounter for general adult medical examination with abnormal findings   [facility] [place], Coralie Keens, NP   1 year ago Burning with urination   [facility] [place], Coralie Keens, NP   1 year ago [facility] [place], Coralie Keens, NP   1 year ago Medicare annual wellness visit, subsequent   [facility] [place], Coralie Keens, NP  Future Appointments             In 3 weeks [place], Coralie Keens, NP [place] BP in normal range    BP Readings from Last 1 Encounters:  05/07/22 128/82         Passed - Last Heart Rate in normal range    Pulse Readings from Last 1 Encounters:  05/07/22 84          buPROPion (WELLBUTRIN XL) 300 MG 24 hr tablet [Pharmacy Med Name: BUPROPION HYDROCHLORIDE ER (XL) 300 MG Tablet Extended Release 24 Hour] 90 tablet     Sig: TAKE 1 TABLET EVERY DAY     Psychiatry: Antidepressants - bupropion Failed - 05/14/2022  1:12 PM      Failed - Cr in normal range and within 360 days    Creat  Date Value Ref Range Status  09/24/2021 1.00 0.60 - 1.00 mg/dL Final   Creatinine, Ser  Date Value Ref Range Status  04/19/2022 1.38 (H) 0.44 - 1.00 mg/dL Final         Failed - Valid encounter within last 6 months    Recent Outpatient Visits           1 week ago Dark urine   [place], Coralie Keens, NP   7 months ago Encounter for general adult medical examination with abnormal findings   [facility], Coralie Keens, NP   1 year ago Burning with urination   [facility] [place], Coralie Keens, NP   1 year ago [facility] [place], Coralie Keens, NP   1 year ago Medicare annual wellness visit, subsequent   [facility] [place], Coralie Keens, NP       Future Appointments             In 3 weeks [place], Coralie Keens, NP [place] - AST in normal range and within 360 days    AST  Date Value Ref Range Status  09/24/2021 16 10 - 35 U/L Final   SGOT(AST)  Date Value Ref Range Status  02/25/2014 15 15 - 37 Unit/L Final         Passed - ALT in normal range and within 360 days    ALT  Date Value Ref Range Status  09/24/2021 12 6 - 29 U/L Final   SGPT (ALT)  Date Value Ref Range Status  02/25/2014 23 U/L Final    Comment:    14-63 NOTE: New Reference Range 12/04/13          Passed - Completed PHQ-2 or PHQ-9 in the last 360 days      Passed - Last BP in normal range    BP Readings from Last 1 Encounters:  05/07/22 128/82         Signed Prescriptions Disp Refills   lisinopril (ZESTRIL) 5 MG tablet 90 tablet 0    Sig: TAKE 1 TABLET EVERY DAY     Cardiovascular:  ACE Inhibitors Failed - 05/14/2022  1:12 PM      Failed - Cr in normal range and within 180 days    Creat  Date Value Ref Range Status  09/24/2021 1.00 0.60 - 1.00 mg/dL Final   Creatinine, Ser  Date Value  Ref Range Status  04/19/2022 1.38 (H) 0.44 - 1.00 mg/dL Final         Failed - Valid encounter within last 6 months    Recent Outpatient Visits           1 week ago Dark urine   [facility] [place], Coralie Keens, NP   7 months ago Encounter for general adult medical examination with abnormal findings   [facility], Coralie Keens, NP   1 year ago Burning with urination   [facility] [place], Coralie Keens, NP   1 year ago [facility] [place], Coralie Keens, NP   1 year ago Medicare annual wellness visit, subsequent   [facility] [place], Coralie Keens, NP       Future  Appointments             In 3 weeks [place], Coralie Keens, NP [facility] [place] in normal range and within 180 days    Potassium  Date Value Ref Range Status  04/19/2022 4.7 3.5 - 5.1 mmol/L Final  02/25/2014 4.2 3.5 - 5.1 mmol/L Final         Passed - Patient is not pregnant      Passed - Last BP in normal range    BP Readings from Last 1 Encounters:  05/07/22 128/82          albuterol (VENTOLIN HFA) 108 (90 Base) MCG/ACT inhaler 1 each 0    Sig: INHALE 2 PUFFS EVERY 6 HOURS AS NEEDED FOR WHEEZING OR SHORTNESS OF BREATH.     Pulmonology:  Beta Agonists 2 Passed - 05/14/2022  1:12 PM      Passed - Last BP in normal range    BP Readings from Last 1 Encounters:  05/07/22 128/82         Passed - Last Heart Rate in normal range    Pulse Readings from Last 1 Encounters:  05/07/22 84         Passed - Valid encounter within last 12 months    Recent Outpatient Visits           1 week ago Dark urine   [facility] [place], Coralie Keens, NP   7 months ago Encounter for general adult medical examination with abnormal findings   [facility] [place], Coralie Keens, NP   1 year ago Burning with urination   [facility] [place], Coralie Keens, NP   1 year ago [facility] [place], Coralie Keens, NP   1 year ago Medicare annual wellness visit, subsequent   [facility] [place], Coralie Keens, NP       Future Appointments             In 3 weeks [place] Gun[place], Coralie Keens, NP [facility], [facility]

## 2022-05-15 NOTE — Telephone Encounter (Signed)
Requested Prescriptions  Pending Prescriptions Disp Refills   sertraline (ZOLOFT) 100 MG tablet [Pharmacy Med Name: SERTRALINE HYDROCHLORIDE 100 MG Tablet] 90 tablet     Sig: TAKE 1 TABLET EVERY DAY     Psychiatry:  Antidepressants - SSRI - sertraline Failed - 05/14/2022  1:12 PM      Failed - Valid encounter within last 6 months    Recent Outpatient Visits           1 week ago Dark urine   [facility], Coralie Keens, NP   7 months ago Encounter for general adult medical examination with abnormal findings   [facility], Coralie Keens, NP   1 year ago Burning with urination   [facility] [place], Coralie Keens, NP   1 year ago [facility] [place], Coralie Keens, NP   1 year ago Medicare annual wellness visit, subsequent   [facility] [place], Coralie Keens, NP       Future Appointments             In 3 weeks [place], Coralie Keens, NP [facility] [place] - AST in normal range and within 360 days    AST  Date Value Ref Range Status  09/24/2021 16 10 - 35 U/L Final   SGOT(AST)  Date Value Ref Range Status  02/25/2014 15 15 - 37 Unit/L Final         Passed - ALT in normal range and within 360 days    ALT  Date Value Ref Range Status  09/24/2021 12 6 - 29 U/L Final   SGPT (ALT)  Date Value Ref Range Status  02/25/2014 23 U/L Final    Comment:    14-63 NOTE: New Reference Range 12/04/13          Passed - Completed PHQ-2 or PHQ-9 in the last 360 days       propranolol (INDERAL) 40 MG tablet [Pharmacy Med Name: PROPRANOLOL HYDROCHLORIDE 40 MG Tablet] 180 tablet     Sig: TAKE 1 TABLET TWICE DAILY     Cardiovascular:  Beta Blockers Failed - 05/14/2022  1:12 PM      Failed - Valid encounter within last 6 months    Recent Outpatient Visits           1 week ago Dark urine   [facility] [place], Coralie Keens, NP   7 months ago Encounter for general adult  medical examination with abnormal findings   [facility] [place], Coralie Keens, NP   1 year ago Burning with urination   [facility] [place], Coralie Keens, NP   1 year ago [facility] [place], Coralie Keens, NP   1 year ago Medicare annual wellness visit, subsequent   [facility] [place], Coralie Keens, NP       Future Appointments             In 3 weeks Garnette Gun[place], Coralie Keens, NP [place] BP in normal range    BP Readings from Last 1 Encounters:  05/07/22 128/82         Passed - Last Heart Rate in normal range    Pulse Readings from Last 1 Encounters:  05/07/22 84          buPROPion (WELLBUTRIN XL) 300 MG 24 hr tablet [Pharmacy Med Name: BUPROPION HYDROCHLORIDE ER (XL) 300 MG Tablet Extended Release 24 Hour] 90 tablet     Sig: TAKE 1 TABLET EVERY DAY     Psychiatry: Antidepressants - bupropion Failed - 05/14/2022  1:12 PM      Failed - Cr in normal range and within 360 days    Creat  Date Value Ref Range Status  09/24/2021 1.00 0.60 - 1.00 mg/dL Final   Creatinine, Ser  Date Value Ref Range Status  04/19/2022 1.38 (H) 0.44 - 1.00 mg/dL Final         Failed - Valid encounter within last 6 months    Recent Outpatient Visits           1 week ago Dark urine   [facility] [place], Coralie Keens, NP   7 months ago Encounter for general adult medical examination with abnormal findings   [facility], Coralie Keens, NP   1 year ago Burning with urination   [facility] [place], Coralie Keens, NP   1 year ago [facility] [place], Coralie Keens, NP   1 year ago Medicare annual wellness visit, subsequent   [facility] [place], Coralie Keens, NP       Future Appointments             In 3 weeks [place], Coralie Keens, NP [place] - AST in normal range and within 360 days     AST  Date Value Ref Range Status  09/24/2021 16 10 - 35 U/L Final   SGOT(AST)  Date Value Ref Range Status  02/25/2014 15 15 - 37 Unit/L Final         Passed - ALT in normal range and within 360 days    ALT  Date Value Ref Range Status  09/24/2021 12 6 - 29 U/L Final   SGPT (ALT)  Date Value Ref Range Status  02/25/2014 23 U/L Final    Comment:    14-63 NOTE: New Reference Range 12/04/13          Passed - Completed PHQ-2 or PHQ-9 in the last 360 days      Passed - Last BP in normal range    BP Readings from Last 1 Encounters:  05/07/22 128/82          lisinopril (ZESTRIL) 5 MG tablet [Pharmacy Med Name: LISINOPRIL 5 MG Tablet] 90 tablet 0    Sig: TAKE 1 TABLET EVERY DAY     Cardiovascular:  ACE Inhibitors Failed - 05/14/2022  1:12 PM      Failed - Cr in normal range and within 180 days    Creat  Date Value Ref Range Status  09/24/2021 1.00 0.60 - 1.00 mg/dL Final   Creatinine, Ser  Date Value Ref Range Status  04/19/2022 1.38 (H) 0.44 - 1.00 mg/dL Final         Failed - Valid encounter within last 6 months    Recent Outpatient Visits           1 week ago Dark urine   [facility] [place], Coralie Keens, NP   7 months ago Encounter for general adult medical examination with abnormal findings   [facility],  Coralie Keens, NP   1 year ago Burning with urination   [facility] [place], Coralie Keens, NP   1 year ago [facility] [place], Coralie Keens, NP   1 year ago Medicare annual wellness visit, subsequent   [facility] [place], Coralie Keens, NP       Future Appointments             In 3 weeks [place], Coralie Keens, NP [facility], [place] in normal range and within 180 days    Potassium  Date Value Ref Range Status  04/19/2022 4.7 3.5 - 5.1 mmol/L Final  02/25/2014 4.2 3.5 - 5.1 mmol/L Final         Passed - Patient is not pregnant       Passed - Last BP in normal range    BP Readings from Last 1 Encounters:  05/07/22 128/82          albuterol (VENTOLIN HFA) 108 (90 Base) MCG/ACT inhaler [Pharmacy Med Name: ALBUTEROL SULFATE HFA 108 (90 Base) MCG/ACT Aerosol Solution] 1 each 0    Sig: INHALE 2 PUFFS EVERY 6 HOURS AS NEEDED FOR WHEEZING OR SHORTNESS OF BREATH.     Pulmonology:  Beta Agonists 2 Passed - 05/14/2022  1:12 PM      Passed - Last BP in normal range    BP Readings from Last 1 Encounters:  05/07/22 128/82         Passed - Last Heart Rate in normal range    Pulse Readings from Last 1 Encounters:  05/07/22 84         Passed - Valid encounter within last 12 months    Recent Outpatient Visits           1 week ago Dark urine   [facility] [place], Coralie Keens, NP   7 months ago Encounter for general adult medical examination with abnormal findings   [facility] [place], Coralie Keens, NP   1 year ago Burning with urination   [facility] [place], Coralie Keens, NP   1 year ago [facility] [place], Coralie Keens, NP   1 year ago Medicare annual wellness visit, subsequent   [facility] [place], Coralie Keens, NP       Future Appointments             In 3 weeks Garnette Gun[place], Coralie Keens, NP [facility]

## 2022-05-19 ENCOUNTER — Ambulatory Visit
Admission: RE | Admit: 2022-05-19 | Discharge: 2022-05-19 | Disposition: A | Discharge status: Home / Self Care | Payer: Medicare HMO | Attending: Ophthalmology | Admitting: Ophthalmology

## 2022-05-19 ENCOUNTER — Ambulatory Visit: Payer: Medicare HMO | Admitting: Anesthesiology

## 2022-05-19 ENCOUNTER — Encounter: Payer: Self-pay | Admitting: Ophthalmology

## 2022-05-19 ENCOUNTER — Encounter
Admission: RE | Disposition: A | Discharge status: Home / Self Care | Payer: Self-pay | Source: Home / Self Care | Attending: Ophthalmology

## 2022-05-19 ENCOUNTER — Other Ambulatory Visit: Payer: Self-pay

## 2022-05-19 DIAGNOSIS — Z85828 Personal history of other malignant neoplasm of skin: Secondary | ICD-10-CM | POA: Diagnosis not present

## 2022-05-19 DIAGNOSIS — J449 Chronic obstructive pulmonary disease, unspecified: Secondary | ICD-10-CM | POA: Insufficient documentation

## 2022-05-19 DIAGNOSIS — F32A Depression, unspecified: Secondary | ICD-10-CM | POA: Insufficient documentation

## 2022-05-19 DIAGNOSIS — N183 Chronic kidney disease, stage 3 unspecified: Secondary | ICD-10-CM | POA: Insufficient documentation

## 2022-05-19 DIAGNOSIS — Z6841 Body Mass Index (BMI) 40.0 and over, adult: Secondary | ICD-10-CM | POA: Diagnosis not present

## 2022-05-19 DIAGNOSIS — D631 Anemia in chronic kidney disease: Secondary | ICD-10-CM | POA: Insufficient documentation

## 2022-05-19 DIAGNOSIS — H2511 Age-related nuclear cataract, right eye: Secondary | ICD-10-CM | POA: Diagnosis not present

## 2022-05-19 DIAGNOSIS — Z87891 Personal history of nicotine dependence: Secondary | ICD-10-CM | POA: Insufficient documentation

## 2022-05-19 DIAGNOSIS — I129 Hypertensive chronic kidney disease with stage 1 through stage 4 chronic kidney disease, or unspecified chronic kidney disease: Secondary | ICD-10-CM | POA: Diagnosis not present

## 2022-05-19 DIAGNOSIS — Z8541 Personal history of malignant neoplasm of cervix uteri: Secondary | ICD-10-CM | POA: Diagnosis not present

## 2022-05-19 DIAGNOSIS — M1990 Unspecified osteoarthritis, unspecified site: Secondary | ICD-10-CM | POA: Diagnosis not present

## 2022-05-19 DIAGNOSIS — Z79899 Other long term (current) drug therapy: Secondary | ICD-10-CM | POA: Insufficient documentation

## 2022-05-19 DIAGNOSIS — T7840XA Allergy, unspecified, initial encounter: Secondary | ICD-10-CM | POA: Diagnosis not present

## 2022-05-19 HISTORY — DX: Chronic kidney disease, stage 3 unspecified: N18.30

## 2022-05-19 HISTORY — DX: Sleep apnea, unspecified: G47.30

## 2022-05-19 HISTORY — DX: Presence of external hearing-aid: Z97.4

## 2022-05-19 HISTORY — DX: Dependence on other enabling machines and devices: Z99.89

## 2022-05-19 HISTORY — DX: Presence of dental prosthetic device (complete) (partial): Z97.2

## 2022-05-19 HISTORY — PX: CATARACT EXTRACTION W/PHACO: SHX586

## 2022-05-19 SURGERY — PHACOEMULSIFICATION, CATARACT, WITH IOL INSERTION
Anesthesia: Monitor Anesthesia Care | Site: Eye | Laterality: Right

## 2022-05-19 MED ORDER — SIGHTPATH DOSE#1 BSS IO SOLN
INTRAOCULAR | Status: DC | PRN
  Administered 2022-05-19: 1 mL via INTRAMUSCULAR

## 2022-05-19 MED ORDER — CEFUROXIME OPHTHALMIC INJECTION 1 MG/0.1 ML
INJECTION | OPHTHALMIC | Status: DC | PRN
  Administered 2022-05-19: .1 mL via INTRACAMERAL

## 2022-05-19 MED ORDER — SIGHTPATH DOSE#1 NA HYALUR & NA CHOND-NA HYALUR IO KIT
PACK | INTRAOCULAR | Status: DC | PRN
  Administered 2022-05-19: 1 via OPHTHALMIC

## 2022-05-19 MED ORDER — SIGHTPATH DOSE#1 BSS IO SOLN
INTRAOCULAR | Status: DC | PRN
  Administered 2022-05-19: 15 mL

## 2022-05-19 MED ORDER — BRIMONIDINE TARTRATE-TIMOLOL 0.2-0.5 % OP SOLN
OPHTHALMIC | Status: DC | PRN
  Administered 2022-05-19: 1 [drp] via OPHTHALMIC

## 2022-05-19 MED ORDER — DEXMEDETOMIDINE HCL IN NACL 80 MCG/20ML IV SOLN
INTRAVENOUS | Status: DC | PRN
  Administered 2022-05-19: 4 ug via BUCCAL

## 2022-05-19 MED ORDER — LACTATED RINGERS IV SOLN: INTRAVENOUS | Status: DC

## 2022-05-19 MED ORDER — FENTANYL CITRATE (PF) 100 MCG/2ML IJ SOLN
INTRAMUSCULAR | Status: DC | PRN
  Administered 2022-05-19: 25 ug via INTRAVENOUS

## 2022-05-19 MED ORDER — TETRACAINE HCL 0.5 % OP SOLN
1.0000 [drp] | OPHTHALMIC | Status: DC | PRN
  Administered 2022-05-19 (×3): 1 [drp] via OPHTHALMIC

## 2022-05-19 MED ORDER — SODIUM CHLORIDE 0.9% FLUSH
INTRAVENOUS | Status: DC | PRN
  Administered 2022-05-19: 10 mL via INTRAVENOUS

## 2022-05-19 MED ORDER — MIDAZOLAM HCL 2 MG/2ML IJ SOLN
INTRAMUSCULAR | Status: DC | PRN
  Administered 2022-05-19: 1 mg via INTRAVENOUS

## 2022-05-19 MED ORDER — ARMC OPHTHALMIC DILATING DROPS
1.0000 | OPHTHALMIC | Status: DC | PRN
  Administered 2022-05-19 (×3): 1 via OPHTHALMIC

## 2022-05-19 SURGICAL SUPPLY — 10 items
CATARACT SUITE SIGHTPATH (MISCELLANEOUS) ×1 IMPLANT
FEE CATARACT SUITE SIGHTPATH (MISCELLANEOUS) ×1 IMPLANT
GLOVE SRG 8 PF TXTR STRL LF DI (GLOVE) ×1 IMPLANT
GLOVE SURG ENC TEXT LTX SZ7.5 (GLOVE) ×1 IMPLANT
GLOVE SURG UNDER POLY LF SZ8 (GLOVE) ×1
LENS IOL TECNIS EYHANCE 22.5 (Intraocular Lens) IMPLANT
NDL FILTER BLUNT 18X1 1/2 (NEEDLE) ×1 IMPLANT
NEEDLE FILTER BLUNT 18X1 1/2 (NEEDLE) ×1 IMPLANT
SYR 3ML LL SCALE MARK (SYRINGE) ×1 IMPLANT
WATER STERILE IRR 250ML POUR (IV SOLUTION) ×1 IMPLANT

## 2022-05-19 NOTE — Op Note (Signed)
  LOCATION:  [place]   PREOPERATIVE DIAGNOSIS:    Nuclear sclerotic cataract right eye. H25.11   POSTOPERATIVE DIAGNOSIS:  Nuclear sclerotic cataract right eye.     PROCEDURE:  Phacoemusification with posterior chamber intraocular lens placement of the right eye   ULTRASOUND TIME: Procedure(s) with comments: CATARACT EXTRACTION PHACO AND INTRAOCULAR LENS PLACEMENT (IOC) RIGHT  5.64  00:50.6 (Right) - Sleep apnea  LENS:   Implant Name Type Inv. Item Serial No. Manufacturer Lot No. LRB No. Used Action  LENS IOL TECNIS EYHANCE 22.5 - [id number] Intraocular Lens LENS IOL TECNIS EYHANCE 22.5 [id number] [employer]  Right 1 Implanted         SURGEON:  Wyonia Hough, MD   ANESTHESIA:  Topical with tetracaine drops and 2% Xylocaine jelly, augmented with 1% preservative-free intracameral lidocaine.    COMPLICATIONS:  None.   DESCRIPTION OF PROCEDURE:  The patient was identified in the holding room and transported to the operating room and placed in the supine position under the operating microscope.  The right eye was identified as the operative eye and it was prepped and draped in the usual sterile ophthalmic fashion.   A 1 millimeter clear-corneal paracentesis was made at the 12:00 position.  0.5 ml of preservative-free 1% lidocaine was injected into the anterior chamber. The anterior chamber was filled with Viscoat viscoelastic.  A 2.4 millimeter keratome was used to make a near-clear corneal incision at the 9:00 position.  A curvilinear capsulorrhexis was made with a cystotome and capsulorrhexis forceps.  Balanced salt solution was used to hydrodissect and hydrodelineate the nucleus.   Phacoemulsification was then used in stop and chop fashion to remove the lens nucleus and epinucleus.  The remaining cortex was then removed using the irrigation and aspiration handpiece. Provisc was then placed into the capsular bag to distend it for lens placement.  A lens was then  injected into the capsular bag.  The remaining viscoelastic was aspirated.   Wounds were hydrated with balanced salt solution.  The anterior chamber was inflated to a physiologic pressure with balanced salt solution.  No wound leaks were noted. Cefuroxime 0.1 ml of a '10mg'$ /ml solution was injected into the anterior chamber for a dose of 1 mg of intracameral antibiotic at the completion of the case.   Timolol and Brimonidine drops were applied to the eye.  The patient was taken to the recovery room in stable condition without complications of anesthesia or surgery.   Kristie Byrd 05/19/2022, 2:53 PM

## 2022-05-19 NOTE — H&P (Signed)
[facility]   Primary Care Physician:  Jearld Fenton, NP Ophthalmologist: Dr. Leandrew Koyanagi  Pre-Procedure History & Physical: HPI:  Kristie Byrd is a 77 y.o. female here for ophthalmic surgery.   Past Medical History:  Diagnosis Date   Arthritis    Cancer ([place])    cervix, skin   CKD (chronic kidney disease), stage III (HCC)    Colon polyps    COPD (chronic obstructive pulmonary disease) (HCC)    Depression    GERD (gastroesophageal reflux disease)    Headache    Hypertension    Iron deficiency anemia 09/27/2012   PONV (postoperative nausea and vomiting)    Shingles    Sleep apnea    starting CPAP in 2024   Uses walker    or cane   Wears dentures    full upper and lower   Wears hearing aid in right ear     Past Surgical History:  Procedure Laterality Date   BREAST BIOPSY Bilateral    neg   CARPAL TUNNEL RELEASE Bilateral    COLONOSCOPY  multiple   KNEE ARTHROPLASTY Right 05/12/2016   Procedure: COMPUTER ASSISTED TOTAL KNEE ARTHROPLASTY;  Surgeon: Dereck Leep, MD;  Location: [facility];  Service: Orthopedics;  Laterality: Right;   KNEE SURGERY Right    NOSE SURGERY     TONSILLECTOMY      Prior to Admission medications   Medication Sig Start Date End Date Taking? Authorizing Provider  acetaminophen (TYLENOL) 650 MG CR tablet Take 1,000 mg by mouth every 4 (four) hours as needed.   Yes [provider]  Budeson-Glycopyrrol-Formoterol (BREZTRI AEROSPHERE) 160-9-4.8 MCG/ACT AERO Inhale 2 puffs into the lungs 2 (two) times daily. 09/24/21  Yes Baity, Coralie Keens, NP  calcitRIOL (ROCALTROL) 0.25 MCG capsule Take 0.25 mcg by mouth daily.   Yes [provider]  Cholecalciferol (VITAMIN D3) 1000 units CAPS Take 1,000 Units by mouth daily.   Yes [provider]  ferrous sulfate 325 (65 FE) MG EC tablet Take 325 mg by mouth 3 (three) times daily with meals.   Yes [provider]  Multiple Vitamin (MULTIVITAMIN) tablet Take 1  tablet by mouth at bedtime.    Yes [provider]  albuterol (VENTOLIN HFA) 108 (90 Base) MCG/ACT inhaler INHALE 2 PUFFS EVERY 6 HOURS AS NEEDED FOR WHEEZING OR SHORTNESS OF BREATH. 05/15/22   Jearld Fenton, NP  buPROPion (WELLBUTRIN XL) 300 MG 24 hr tablet TAKE 1 TABLET EVERY DAY 05/18/22   Jearld Fenton, NP  fluticasone (FLONASE) 50 MCG/ACT nasal spray Place 1 spray into both nostrils as needed for allergies or rhinitis. Patient not taking: Reported on 05/06/2022    [provider]  lisinopril (ZESTRIL) 5 MG tablet TAKE 1 TABLET EVERY DAY 05/15/22   Jearld Fenton, NP  propranolol (INDERAL) 40 MG tablet TAKE 1 TABLET TWICE DAILY 05/18/22   Jearld Fenton, NP  sertraline (ZOLOFT) 100 MG tablet TAKE 1 TABLET EVERY DAY 05/18/22   Jearld Fenton, NP    Allergies as of 04/05/2022 - Review Complete 03/15/2022  Allergen Reaction Noted   Codeine Nausea And Vomiting 08/16/2012   Demerol [meperidine] Nausea And Vomiting 08/16/2012   Fleet phospho-soda [sodium phosphates] Nausea And Vomiting 08/16/2012   Latex Rash 03/25/2016   Other Other (See Comments) 04/28/2016   Prednisone Other (See Comments) 08/16/2012    Family History  Problem Relation Age of Onset   Colon cancer Daughter  Social History   Socioeconomic History   Marital status: Divorced    Spouse name: Not on file   Number of children: 2   Years of education: Not on file   Highest education level: Not on file  Occupational History   Occupation: retired  Tobacco Use   Smoking status: Former    Packs/day: 1.00    Years: 37.00    Total pack years: 37.00    Types: Cigarettes    Quit date: 11/15/1996    Years since quitting: 25.5   Smokeless tobacco: Never  Vaping Use   Vaping Use: Never used  Substance and Sexual Activity   Alcohol use: No    Alcohol/week: 0.0 standard drinks of alcohol   Drug use: No    Comment: uses CBD oil    Sexual activity: Never  Other Topics Concern   Not on file  Social  History Narrative   Lives alone.  Divorced. Two daughters- in 20s, 3 grandchildren.   Does not know family history- raised in [facility].        Desires CPR.  Does not have HPOA.   She would possibly want life support if for short period of time . Unsure about feeding tube.   Social Determinants of Health   Financial Resource Strain: Low Risk  (09/24/2021)   Overall Financial Resource Strain (CARDIA)    Difficulty of Paying Living Expenses: Not very hard  Food Insecurity: No Food Insecurity (09/24/2021)   Hunger Vital Sign    Worried About Running Out of Food in the Last Year: Never true    Ran Out of Food in the Last Year: Never true  Transportation Needs: No Transportation Needs (09/24/2021)   PRAPARE - Hydrologist (Medical): No    Lack of Transportation (Non-Medical): No  Physical Activity: Inactive (09/24/2021)   Exercise Vital Sign    Days of Exercise per Week: 0 days    Minutes of Exercise per Session: 0 min  Stress: No Stress Concern Present (09/24/2021)   [place]    Feeling of Stress : Not at all  Social Connections: Socially Isolated (09/24/2021)   Social Connection and Isolation Panel [NHANES]    Frequency of Communication with Friends and Family: More than three times a week    Frequency of Social Gatherings with Friends and Family: Never    Attends Religious Services: Never    Marine scientist or Organizations: No    Attends Archivist Meetings: Never    Marital Status: Divorced  Human resources officer Violence: Not At Risk (09/24/2021)   Humiliation, Afraid, Rape, and Kick questionnaire    Fear of Current or Ex-Partner: No    Emotionally Abused: No    Physically Abused: No    Sexually Abused: No    Review of Systems: See HPI, otherwise negative ROS  Physical Exam: Ht '5\' 4"'$  (1.626 m)   Wt 108.4 kg   BMI 41.02 kg/m  General:   Alert,  pleasant  and cooperative in NAD Head:  Normocephalic and atraumatic. Lungs:  Clear to auscultation.    Heart:  Regular rate and rhythm.   Impression/Plan: Kristie Byrd is here for ophthalmic surgery.  Risks, benefits, limitations, and alternatives regarding ophthalmic surgery have been reviewed with the patient.  Questions have been answered.  All parties agreeable.   Leandrew Koyanagi, MD  05/19/2022, 1:11 PM

## 2022-05-19 NOTE — Anesthesia Preprocedure Evaluation (Signed)
Anesthesia Evaluation  Patient identified by MRN, date of birth, ID band Patient awake    Reviewed: Allergy & Precautions, NPO status , Patient's Chart, lab work & pertinent test results  History of Anesthesia Complications (+) PONV and history of anesthetic complications  Airway Mallampati: II  TM Distance: >3 FB Neck ROM: Full    Dental  (+) Lower Dentures, Upper Dentures   Pulmonary neg sleep apnea, COPD,  COPD inhaler, former smoker   breath sounds clear to auscultation- rhonchi (-) wheezing      Cardiovascular hypertension, Pt. on medications (-) CAD and (-) Past MI  Rhythm:Regular Rate:Normal - Systolic murmurs and - Diastolic murmurs    Neuro/Psych  Headaches   Depression       GI/Hepatic Neg liver ROS,GERD  ,,  Endo/Other  negative endocrine ROSneg diabetes    Renal/GU negative Renal ROS     Musculoskeletal  (+) Arthritis ,    Abdominal  (+) + obese  Peds  Hematology  (+) Blood dyscrasia, anemia   Anesthesia Other Findings Past Medical History: No date: Arthritis No date: Cancer (HCC)     Comment: cervix, skin No date: Colon polyps No date: COPD (chronic obstructive pulmonary disease) (* No date: Depression No date: GERD (gastroesophageal reflux disease) No date: Headache No date: Hypertension 09/27/2012: Iron deficiency anemia No date: PONV (postoperative nausea and vomiting) No date: Shingles   Reproductive/Obstetrics                             Anesthesia Physical Anesthesia Plan  ASA: 3  Anesthesia Plan: MAC   Post-op Pain Management: Minimal or no pain anticipated   Induction: Intravenous  PONV Risk Score and Plan: Midazolam  Airway Management Planned: Natural Airway  Additional Equipment:   Intra-op Plan:   Post-operative Plan:   Informed Consent: I have reviewed the patients History and Physical, chart, labs and discussed the procedure including the  risks, benefits and alternatives for the proposed anesthesia with the patient or authorized representative who has indicated his/her understanding and acceptance.     Dental advisory given  Plan Discussed with: Anesthesiologist and CRNA  Anesthesia Plan Comments: (Patient consented for risks of anesthesia including but not limited to:  - adverse reactions to medications - damage to eyes, teeth, lips or other oral mucosa - nerve damage due to positioning  - sore throat or hoarseness - Damage to heart, brain, nerves, lungs, other parts of body or loss of life  Patient voiced understanding.)        Lab Results  Component Value Date   WBC 5.7 04/19/2022   HGB 12.2 04/19/2022   HCT 38.1 04/19/2022   MCV 93.6 04/19/2022   PLT 252 04/19/2022    Anesthesia Quick Evaluation

## 2022-05-19 NOTE — Transfer of Care (Signed)
Immediate Anesthesia Transfer of Care Note  Patient: Kristie Byrd  Procedure(s) Performed: CATARACT EXTRACTION PHACO AND INTRAOCULAR LENS PLACEMENT (IOC) RIGHT  5.64  00:50.6 (Right: Eye)  Patient Location: PACU  Anesthesia Type: MAC  Level of Consciousness: awake, alert  and patient cooperative  Airway and Oxygen Therapy: Patient Spontanous Breathing and Patient connected to supplemental oxygen  Post-op Assessment: Post-op Vital signs reviewed, Patient's Cardiovascular Status Stable, Respiratory Function Stable, Patent Airway and No signs of Nausea or vomiting  Post-op Vital Signs: Reviewed and stable  Complications: No notable events documented.

## 2022-05-20 ENCOUNTER — Encounter: Payer: Self-pay | Admitting: Ophthalmology

## 2022-05-21 NOTE — Anesthesia Postprocedure Evaluation (Signed)
Anesthesia Post Note  Patient: Kristie Byrd  Procedure(s) Performed: CATARACT EXTRACTION PHACO AND INTRAOCULAR LENS PLACEMENT (IOC) RIGHT  5.64  00:50.6 (Right: Eye)  Patient location during evaluation: PACU Anesthesia Type: MAC Level of consciousness: awake and alert Pain management: pain level controlled Vital Signs Assessment: post-procedure vital signs reviewed and stable Respiratory status: spontaneous breathing, nonlabored ventilation, respiratory function stable and patient connected to nasal cannula oxygen Cardiovascular status: blood pressure returned to baseline and stable Postop Assessment: no apparent nausea or vomiting Anesthetic complications: no  No notable events documented.   Last Vitals:  Vitals:   05/19/22 1313 05/19/22 1456  BP: (!) 148/92 106/69  Pulse: 87 84  Resp: 14 18  Temp: (!) 36.1 C (!) 36.4 C  SpO2: 94% 94%    Last Pain:  Vitals:   05/20/22 1318  TempSrc:   PainSc: 0-No pain                 Dimas Millin

## 2022-05-24 ENCOUNTER — Encounter: Payer: Self-pay | Admitting: Ophthalmology

## 2022-05-27 DIAGNOSIS — H2512 Age-related nuclear cataract, left eye: Secondary | ICD-10-CM | POA: Diagnosis not present

## 2022-05-31 NOTE — Discharge Instructions (Signed)
   Cataract Surgery, Care After ? ?This sheet gives you information about how to care for yourself after your surgery.  Your ophthalmologist may also give you more specific instructions.  If you have problems or questions, contact your doctor at [facility], [phone]. ? ?What can I expect after the surgery? ?It is common to have: ?Itching ?Foreign body sensation (feels like a grain of sand in the eye) ?Watery discharge (excess tearing) ?Sensitivity to light and touch ?Bruising in or around the eye ?Mild blurred vision ? ?Follow these instructions at home: ?Do not touch or rub your eyes. ?You may be told to wear a protective shield or sunglasses to protect your eyes. ?Do not put a contact lens in the operative eye unless your doctor approves. ?Keep the lids and face clean and dry. ?Do not allow water to hit you directly in the face while showering. ?Keep soap and shampoo out of your eyes. ?Do not use eye makeup for 1 week. ? ?Check your eye every day for signs of infection.  Watch for: ?Redness, swelling, or pain. ?Fluid, blood or pus. ?Worsening vision. ?Worsening sensitivity to light or touch. ? ?Activity: ?During the first day, avoid bending over and reading.  You may resume reading and bending the next day. ?Do not drive or use heavy machinery for at least 24 hours. ?Avoid strenuous activities for 1 week.  Activities such as walking, treadmill, exercise bike, and climbing stairs are okay. ?Do not lift heavy (>20 pound) objects for 1 week. ?Do not do yardwork, gardening, or dirty housework (mopping, cleaning bathrooms, vacuuming, etc.) for 1 week. ?Do not swim or use a hot tub for 2 weeks. ?Ask your doctor when you can return to work. ? ?General Instructions: ?Take or apply prescription and over-the-counter medicines as directed by your doctor, including eyedrops and ointments. ?Resume medications discontinued prior to surgery, unless told otherwise by your doctor. ?Keep all follow up appointments as  scheduled. ? ?Contact a health care provider if: ?You have increased bruising around your eye. ?You have pain that is not helped with medication. ?You have a fever. ?You have fluid, pus, or blood coming from your eye or incision. ?Your sensitivity to light gets worse. ?You have spots (floaters) of flashing lights in your vision. ?You have nausea or vomiting. ? ?Go to the nearest emergency room or call 911 if: ?You have sudden loss of vision. ?You have severe, worsening eye pain. ? ?

## 2022-06-02 ENCOUNTER — Other Ambulatory Visit: Payer: Self-pay

## 2022-06-02 ENCOUNTER — Ambulatory Visit
Admission: RE | Admit: 2022-06-02 | Discharge: 2022-06-02 | Disposition: A | Discharge status: Home / Self Care | Payer: Medicare HMO | Attending: Ophthalmology | Admitting: Ophthalmology

## 2022-06-02 ENCOUNTER — Ambulatory Visit: Payer: Self-pay

## 2022-06-02 ENCOUNTER — Ambulatory Visit: Payer: Medicare HMO | Admitting: Anesthesiology

## 2022-06-02 ENCOUNTER — Encounter: Payer: Self-pay | Admitting: Ophthalmology

## 2022-06-02 ENCOUNTER — Encounter
Admission: RE | Disposition: A | Discharge status: Home / Self Care | Payer: Self-pay | Source: Home / Self Care | Attending: Ophthalmology

## 2022-06-02 DIAGNOSIS — K219 Gastro-esophageal reflux disease without esophagitis: Secondary | ICD-10-CM | POA: Insufficient documentation

## 2022-06-02 DIAGNOSIS — G4730 Sleep apnea, unspecified: Secondary | ICD-10-CM | POA: Diagnosis not present

## 2022-06-02 DIAGNOSIS — I129 Hypertensive chronic kidney disease with stage 1 through stage 4 chronic kidney disease, or unspecified chronic kidney disease: Secondary | ICD-10-CM | POA: Insufficient documentation

## 2022-06-02 DIAGNOSIS — Z7951 Long term (current) use of inhaled steroids: Secondary | ICD-10-CM | POA: Diagnosis not present

## 2022-06-02 DIAGNOSIS — Z87891 Personal history of nicotine dependence: Secondary | ICD-10-CM | POA: Diagnosis not present

## 2022-06-02 DIAGNOSIS — N183 Chronic kidney disease, stage 3 unspecified: Secondary | ICD-10-CM | POA: Insufficient documentation

## 2022-06-02 DIAGNOSIS — H2512 Age-related nuclear cataract, left eye: Secondary | ICD-10-CM | POA: Diagnosis not present

## 2022-06-02 DIAGNOSIS — J449 Chronic obstructive pulmonary disease, unspecified: Secondary | ICD-10-CM | POA: Insufficient documentation

## 2022-06-02 DIAGNOSIS — H269 Unspecified cataract: Secondary | ICD-10-CM | POA: Diagnosis not present

## 2022-06-02 HISTORY — PX: CATARACT EXTRACTION W/PHACO: SHX586

## 2022-06-02 SURGERY — PHACOEMULSIFICATION, CATARACT, WITH IOL INSERTION
Anesthesia: Monitor Anesthesia Care | Site: Eye | Laterality: Left

## 2022-06-02 MED ORDER — SIGHTPATH DOSE#1 NA HYALUR & NA CHOND-NA HYALUR IO KIT
PACK | INTRAOCULAR | Status: DC | PRN
  Administered 2022-06-02: 1 via OPHTHALMIC

## 2022-06-02 MED ORDER — SIGHTPATH DOSE#1 BSS IO SOLN
INTRAOCULAR | Status: DC | PRN
  Administered 2022-06-02: 48 mL via OPHTHALMIC

## 2022-06-02 MED ORDER — ARMC OPHTHALMIC DILATING DROPS
1.0000 | OPHTHALMIC | Status: DC | PRN
  Administered 2022-06-02 (×3): 1 via OPHTHALMIC

## 2022-06-02 MED ORDER — FENTANYL CITRATE (PF) 100 MCG/2ML IJ SOLN
INTRAMUSCULAR | Status: DC | PRN
  Administered 2022-06-02 (×2): 50 ug via INTRAVENOUS

## 2022-06-02 MED ORDER — BRIMONIDINE TARTRATE-TIMOLOL 0.2-0.5 % OP SOLN
OPHTHALMIC | Status: DC | PRN
  Administered 2022-06-02: 1 [drp] via OPHTHALMIC

## 2022-06-02 MED ORDER — CEFUROXIME OPHTHALMIC INJECTION 1 MG/0.1 ML
INJECTION | OPHTHALMIC | Status: DC | PRN
  Administered 2022-06-02: .1 mL via INTRACAMERAL

## 2022-06-02 MED ORDER — SIGHTPATH DOSE#1 BSS IO SOLN
INTRAOCULAR | Status: DC | PRN
  Administered 2022-06-02: 15 mL via INTRAOCULAR

## 2022-06-02 MED ORDER — MIDAZOLAM HCL 2 MG/2ML IJ SOLN
INTRAMUSCULAR | Status: DC | PRN
  Administered 2022-06-02: 1 mg via INTRAVENOUS
  Administered 2022-06-02 (×2): .5 mg via INTRAVENOUS

## 2022-06-02 MED ORDER — LACTATED RINGERS IV SOLN: INTRAVENOUS | Status: DC

## 2022-06-02 MED ORDER — TETRACAINE HCL 0.5 % OP SOLN
1.0000 [drp] | OPHTHALMIC | Status: DC | PRN
  Administered 2022-06-02 (×3): 1 [drp] via OPHTHALMIC

## 2022-06-02 MED ORDER — SIGHTPATH DOSE#1 BSS IO SOLN
INTRAOCULAR | Status: DC | PRN
  Administered 2022-06-02: 2 mL

## 2022-06-02 SURGICAL SUPPLY — 12 items
CANNULA ANT/CHMB 27G (MISCELLANEOUS) IMPLANT
CANNULA ANT/CHMB 27GA (MISCELLANEOUS) IMPLANT
CATARACT SUITE SIGHTPATH (MISCELLANEOUS) ×1 IMPLANT
FEE CATARACT SUITE SIGHTPATH (MISCELLANEOUS) ×1 IMPLANT
GLOVE SRG 8 PF TXTR STRL LF DI (GLOVE) ×1 IMPLANT
GLOVE SURG GAMMEX PI TX LF 7.5 (GLOVE) IMPLANT
GLOVE SURG UNDER POLY LF SZ8 (GLOVE) ×1
LENS IOL TECNIS EYHANCE 21.5 (Intraocular Lens) IMPLANT
NDL FILTER BLUNT 18X1 1/2 (NEEDLE) ×1 IMPLANT
NEEDLE FILTER BLUNT 18X1 1/2 (NEEDLE) ×1 IMPLANT
SYR 3ML LL SCALE MARK (SYRINGE) ×1 IMPLANT
WATER STERILE IRR 250ML POUR (IV SOLUTION) ×1 IMPLANT

## 2022-06-02 NOTE — Telephone Encounter (Signed)
Pt mentioned she has pressure from UTI / pt has appt for tomorrow but wanted to see if possible to get in this afternoon / please advise / no opening for today   Left message to call back.

## 2022-06-02 NOTE — Anesthesia Postprocedure Evaluation (Signed)
Anesthesia Post Note  Patient: Kristie Byrd  Procedure(s) Performed: CATARACT EXTRACTION PHACO AND INTRAOCULAR LENS PLACEMENT (IOC) LEFT 4.93 52.5 (Left: Eye)  Patient location during evaluation: PACU Anesthesia Type: MAC Level of consciousness: awake and alert Pain management: pain level controlled Vital Signs Assessment: post-procedure vital signs reviewed and stable Respiratory status: spontaneous breathing, nonlabored ventilation and respiratory function stable Cardiovascular status: blood pressure returned to baseline and stable Postop Assessment: no apparent nausea or vomiting Anesthetic complications: no   No notable events documented.   Last Vitals:  Vitals:   06/02/22 1304 06/02/22 1309  BP: 114/70 118/75  Pulse: 67 63  Resp: 14 20  Temp: (!) 36.2 C (!) 36.2 C  SpO2: 93% 94%    Last Pain:  Vitals:   06/02/22 1309  TempSrc:   PainSc: 0-No pain                 Precious Haws Annika Selke

## 2022-06-02 NOTE — Telephone Encounter (Signed)
2nd attempt, Patient called, left VM to return the call to the office to discuss symptoms with a nurse.  

## 2022-06-02 NOTE — Anesthesia Preprocedure Evaluation (Signed)
Anesthesia Evaluation  Patient identified by MRN, date of birth, ID band Patient awake    Reviewed: Allergy & Precautions, NPO status , Patient's Chart, lab work & pertinent test results  History of Anesthesia Complications (+) PONV and history of anesthetic complications  Airway Mallampati: III  TM Distance: <3 FB Neck ROM: full    Dental  (+) Lower Dentures, Upper Dentures   Pulmonary shortness of breath and with exertion, sleep apnea , COPD, former smoker   Pulmonary exam normal        Cardiovascular hypertension, (-) angina Normal cardiovascular exam     Neuro/Psych  PSYCHIATRIC DISORDERS      negative neurological ROS     GI/Hepatic Neg liver ROS,GERD  Controlled,,  Endo/Other  negative endocrine ROS    Renal/GU Renal disease     Musculoskeletal   Abdominal   Peds  Hematology negative hematology ROS (+)   Anesthesia Other Findings Past Medical History: No date: Arthritis No date: CKD (chronic kidney disease), stage III (HCC) No date: Colon polyps No date: COPD (chronic obstructive pulmonary disease) (HCC) No date: Hypertension 09/27/2012: Iron deficiency anemia No date: PONV (postoperative nausea and vomiting) No date: Shingles No date: Sleep apnea     Comment:  starting CPAP in 2024 No date: Uses walker     Comment:  or cane No date: Wears dentures     Comment:  full upper and lower No date: Wears hearing aid in right ear  Past Surgical History: No date: BREAST BIOPSY; Bilateral     Comment:  neg No date: CARPAL TUNNEL RELEASE; Bilateral 05/19/2022: CATARACT EXTRACTION W/PHACO; Right     Comment:  Procedure: CATARACT EXTRACTION PHACO AND INTRAOCULAR               LENS PLACEMENT ([place]) RIGHT  5.64  00:50.6;  Surgeon:               Leandrew Koyanagi, MD;  Location: [place];              Service: Ophthalmology;  Laterality: Right;  Sleep apnea multiple: COLONOSCOPY 05/12/2016: KNEE  ARTHROPLASTY; Right     Comment:  Procedure: COMPUTER ASSISTED TOTAL KNEE ARTHROPLASTY;                Surgeon: Dereck Leep, MD;  Location: [facility];                Service: Orthopedics;  Laterality: Right; No date: KNEE SURGERY; Right No date: NOSE SURGERY No date: TONSILLECTOMY  BMI    Body Mass Index: 40.21 kg/m      Reproductive/Obstetrics negative OB ROS                             Anesthesia Physical Anesthesia Plan  ASA: 3  Anesthesia Plan: MAC   Post-op Pain Management:    Induction: Intravenous  PONV Risk Score and Plan:   Airway Management Planned: Natural Airway and Nasal Cannula  Additional Equipment:   Intra-op Plan:   Post-operative Plan:   Informed Consent: I have reviewed the patients History and Physical, chart, labs and discussed the procedure including the risks, benefits and alternatives for the proposed anesthesia with the patient or authorized representative who has indicated his/her understanding and acceptance.     Dental Advisory Given  Plan Discussed with: Anesthesiologist, CRNA and Surgeon  Anesthesia Plan Comments: (Patient consented for risks of anesthesia including but not limited to:  -  adverse reactions to medications - damage to eyes, teeth, lips or other oral mucosa - nerve damage due to positioning  - sore throat or hoarseness - Damage to heart, brain, nerves, lungs, other parts of body or loss of life  Patient voiced understanding.)       Anesthesia Quick Evaluation

## 2022-06-02 NOTE — H&P (Signed)
[facility]   Primary Care Physician:  Jearld Fenton, NP Ophthalmologist: Dr. Leandrew Koyanagi  Pre-Procedure History & Physical: HPI:  Kristie Byrd is a 77 y.o. female here for ophthalmic surgery.   Past Medical History:  Diagnosis Date   Arthritis    CKD (chronic kidney disease), stage III (HCC)    Colon polyps    COPD (chronic obstructive pulmonary disease) ([place])    Hypertension    Iron deficiency anemia 09/27/2012   PONV (postoperative nausea and vomiting)    Shingles    Sleep apnea    starting CPAP in 2024   Uses walker    or cane   Wears dentures    full upper and lower   Wears hearing aid in right ear     Past Surgical History:  Procedure Laterality Date   BREAST BIOPSY Bilateral    neg   CARPAL TUNNEL RELEASE Bilateral    CATARACT EXTRACTION W/PHACO Right 05/19/2022   Procedure: CATARACT EXTRACTION PHACO AND INTRAOCULAR LENS PLACEMENT ([place]) RIGHT  5.64  00:50.6;  Surgeon: Leandrew Koyanagi, MD;  Location: [place];  Service: Ophthalmology;  Laterality: Right;  Sleep apnea   COLONOSCOPY  multiple   KNEE ARTHROPLASTY Right 05/12/2016   Procedure: COMPUTER ASSISTED TOTAL KNEE ARTHROPLASTY;  Surgeon: Dereck Leep, MD;  Location: [facility];  Service: Orthopedics;  Laterality: Right;   KNEE SURGERY Right    NOSE SURGERY     TONSILLECTOMY      Prior to Admission medications   Medication Sig Start Date End Date Taking? Authorizing Provider  acetaminophen (TYLENOL) 650 MG CR tablet Take 1,000 mg by mouth every 4 (four) hours as needed.   Yes [provider]  albuterol (VENTOLIN HFA) 108 (90 Base) MCG/ACT inhaler INHALE 2 PUFFS EVERY 6 HOURS AS NEEDED FOR WHEEZING OR SHORTNESS OF BREATH. 05/15/22  Yes Baity, Coralie Keens, NP  Budeson-Glycopyrrol-Formoterol (BREZTRI AEROSPHERE) 160-9-4.8 MCG/ACT AERO Inhale 2 puffs into the lungs 2 (two) times daily. 09/24/21  Yes Jearld Fenton, NP  buPROPion (WELLBUTRIN XL) 300 MG 24 hr tablet TAKE 1  TABLET EVERY DAY 05/18/22  Yes Baity, Coralie Keens, NP  calcitRIOL (ROCALTROL) 0.25 MCG capsule Take 0.25 mcg by mouth daily.   Yes [provider]  Cholecalciferol (VITAMIN D3) 1000 units CAPS Take 1,000 Units by mouth daily.   Yes [provider]  ferrous sulfate 325 (65 FE) MG EC tablet Take 325 mg by mouth 3 (three) times daily with meals.   Yes [provider]  fluticasone (FLONASE) 50 MCG/ACT nasal spray Place 1 spray into both nostrils as needed for allergies or rhinitis.   Yes [provider]  lisinopril (ZESTRIL) 5 MG tablet TAKE 1 TABLET EVERY DAY 05/15/22  Yes 63, Coralie Keens, NP  Multiple Vitamin (MULTIVITAMIN) tablet Take 1 tablet by mouth at bedtime.    Yes [provider]  propranolol (INDERAL) 40 MG tablet TAKE 1 TABLET TWICE DAILY 05/18/22  Yes Jearld Fenton, NP  sertraline (ZOLOFT) 100 MG tablet TAKE 1 TABLET EVERY DAY 05/18/22  Yes Jearld Fenton, NP    Allergies as of 04/05/2022 - Review Complete 03/15/2022  Allergen Reaction Noted   Codeine Nausea And Vomiting 08/16/2012   Demerol [meperidine] Nausea And Vomiting 08/16/2012   Fleet phospho-soda [sodium phosphates] Nausea And Vomiting 08/16/2012   Latex Rash 03/25/2016   Other Other (See Comments) 04/28/2016   Prednisone Other (See Comments) 08/16/2012    Family History  Problem Relation  Age of Onset   Colon cancer Daughter     Social History   Socioeconomic History   Marital status: Divorced    Spouse name: Not on file   Number of children: 2   Years of education: Not on file   Highest education level: Not on file  Occupational History   Occupation: retired  Tobacco Use   Smoking status: Former    Packs/day: 1.00    Years: 37.00    Total pack years: 37.00    Types: Cigarettes    Quit date: 11/15/1996    Years since quitting: 25.5   Smokeless tobacco: Never  Vaping Use   Vaping Use: Never used  Substance and Sexual Activity   Alcohol use: No    Alcohol/week: 0.0  standard drinks of alcohol   Drug use: No    Comment: uses CBD oil    Sexual activity: Never  Other Topics Concern   Not on file  Social History Narrative   Lives alone.  Divorced. Two daughters- in 65s, 3 grandchildren.   Does not know family history- raised in [facility].        Desires CPR.  Does not have HPOA.   She would possibly want life support if for short period of time . Unsure about feeding tube.   Social Determinants of Health   Financial Resource Strain: Low Risk  (09/24/2021)   Overall Financial Resource Strain (CARDIA)    Difficulty of Paying Living Expenses: Not very hard  Food Insecurity: No Food Insecurity (09/24/2021)   Hunger Vital Sign    Worried About Running Out of Food in the Last Year: Never true    Ran Out of Food in the Last Year: Never true  Transportation Needs: No Transportation Needs (09/24/2021)   PRAPARE - Hydrologist (Medical): No    Lack of Transportation (Non-Medical): No  Physical Activity: Inactive (09/24/2021)   Exercise Vital Sign    Days of Exercise per Week: 0 days    Minutes of Exercise per Session: 0 min  Stress: No Stress Concern Present (09/24/2021)   [place]    Feeling of Stress : Not at all  Social Connections: Socially Isolated (09/24/2021)   Social Connection and Isolation Panel [NHANES]    Frequency of Communication with Friends and Family: More than three times a week    Frequency of Social Gatherings with Friends and Family: Never    Attends Religious Services: Never    Marine scientist or Organizations: No    Attends Archivist Meetings: Never    Marital Status: Divorced  Human resources officer Violence: Not At Risk (09/24/2021)   Humiliation, Afraid, Rape, and Kick questionnaire    Fear of Current or Ex-Partner: No    Emotionally Abused: No    Physically Abused: No    Sexually Abused: No    Review of  Systems: See HPI, otherwise negative ROS  Physical Exam: BP (!) 140/75   Temp (!) 97.4 F (36.3 C) (Tympanic)   Resp (!) 21   Ht 5' 4.02" (1.626 m)   Wt 106.3 kg   SpO2 97%   BMI 40.21 kg/m  General:   Alert,  pleasant and cooperative in NAD Head:  Normocephalic and atraumatic. Lungs:  Clear to auscultation.    Heart:  Regular rate and rhythm.   Impression/Plan: Kristie Byrd is here for ophthalmic surgery.  Risks, benefits, limitations,  and alternatives regarding ophthalmic surgery have been reviewed with the patient.  Questions have been answered.  All parties agreeable.   Leandrew Koyanagi, MD  06/02/2022, 12:37 PM

## 2022-06-02 NOTE — Transfer of Care (Signed)
Immediate Anesthesia Transfer of Care Note  Patient: Kristie Byrd  Procedure(s) Performed: CATARACT EXTRACTION PHACO AND INTRAOCULAR LENS PLACEMENT (IOC) LEFT 4.93 52.5 (Left: Eye)  Patient Location: PACU  Anesthesia Type: MAC  Level of Consciousness: awake, alert  and patient cooperative  Airway and Oxygen Therapy: Patient Spontanous Breathing and Patient connected to supplemental oxygen  Post-op Assessment: Post-op Vital signs reviewed, Patient's Cardiovascular Status Stable, Respiratory Function Stable, Patent Airway and No signs of Nausea or vomiting  Post-op Vital Signs: Reviewed and stable  Complications: No notable events documented.

## 2022-06-02 NOTE — H&P (Signed)
[facility]   Primary Care Physician:  Jearld Fenton, NP Ophthalmologist: Dr. Leandrew Koyanagi  Pre-Procedure History & Physical: HPI:  Kristie Byrd is a 77 y.o. female here for ophthalmic surgery.   Past Medical History:  Diagnosis Date   Arthritis    CKD (chronic kidney disease), stage III (HCC)    Colon polyps    COPD (chronic obstructive pulmonary disease) ([place])    Hypertension    Iron deficiency anemia 09/27/2012   PONV (postoperative nausea and vomiting)    Shingles    Sleep apnea    starting CPAP in 2024   Uses walker    or cane   Wears dentures    full upper and lower   Wears hearing aid in right ear     Past Surgical History:  Procedure Laterality Date   BREAST BIOPSY Bilateral    neg   CARPAL TUNNEL RELEASE Bilateral    CATARACT EXTRACTION W/PHACO Right 05/19/2022   Procedure: CATARACT EXTRACTION PHACO AND INTRAOCULAR LENS PLACEMENT ([place]) RIGHT  5.64  00:50.6;  Surgeon: Leandrew Koyanagi, MD;  Location: [facility];  Service: Ophthalmology;  Laterality: Right;  Sleep apnea   COLONOSCOPY  multiple   KNEE ARTHROPLASTY Right 05/12/2016   Procedure: COMPUTER ASSISTED TOTAL KNEE ARTHROPLASTY;  Surgeon: Dereck Leep, MD;  Location: [facility];  Service: Orthopedics;  Laterality: Right;   KNEE SURGERY Right    NOSE SURGERY     TONSILLECTOMY      Prior to Admission medications   Medication Sig Start Date End Date Taking? Authorizing Provider  acetaminophen (TYLENOL) 650 MG CR tablet Take 1,000 mg by mouth every 4 (four) hours as needed.   Yes [provider]  albuterol (VENTOLIN HFA) 108 (90 Base) MCG/ACT inhaler INHALE 2 PUFFS EVERY 6 HOURS AS NEEDED FOR WHEEZING OR SHORTNESS OF BREATH. 05/15/22  Yes Baity, Coralie Keens, NP  Budeson-Glycopyrrol-Formoterol (BREZTRI AEROSPHERE) 160-9-4.8 MCG/ACT AERO Inhale 2 puffs into the lungs 2 (two) times daily. 09/24/21  Yes Jearld Fenton, NP  buPROPion (WELLBUTRIN XL) 300 MG 24 hr tablet TAKE 1  TABLET EVERY DAY 05/18/22  Yes Baity, Coralie Keens, NP  calcitRIOL (ROCALTROL) 0.25 MCG capsule Take 0.25 mcg by mouth daily.   Yes [provider]  Cholecalciferol (VITAMIN D3) 1000 units CAPS Take 1,000 Units by mouth daily.   Yes [provider]  ferrous sulfate 325 (65 FE) MG EC tablet Take 325 mg by mouth 3 (three) times daily with meals.   Yes [provider]  fluticasone (FLONASE) 50 MCG/ACT nasal spray Place 1 spray into both nostrils as needed for allergies or rhinitis.   Yes [provider]  lisinopril (ZESTRIL) 5 MG tablet TAKE 1 TABLET EVERY DAY 05/15/22  Yes 35, Coralie Keens, NP  Multiple Vitamin (MULTIVITAMIN) tablet Take 1 tablet by mouth at bedtime.    Yes [provider]  propranolol (INDERAL) 40 MG tablet TAKE 1 TABLET TWICE DAILY 05/18/22  Yes Jearld Fenton, NP  sertraline (ZOLOFT) 100 MG tablet TAKE 1 TABLET EVERY DAY 05/18/22  Yes Jearld Fenton, NP    Allergies as of 04/05/2022 - Review Complete 03/15/2022  Allergen Reaction Noted   Codeine Nausea And Vomiting 08/16/2012   Demerol [meperidine] Nausea And Vomiting 08/16/2012   Fleet phospho-soda [sodium phosphates] Nausea And Vomiting 08/16/2012   Latex Rash 03/25/2016   Other Other (See Comments) 04/28/2016   Prednisone Other (See Comments) 08/16/2012    Family History  Problem Relation  Age of Onset   Colon cancer Daughter     Social History   Socioeconomic History   Marital status: Divorced    Spouse name: Not on file   Number of children: 2   Years of education: Not on file   Highest education level: Not on file  Occupational History   Occupation: retired  Tobacco Use   Smoking status: Former    Packs/day: 1.00    Years: 37.00    Total pack years: 37.00    Types: Cigarettes    Quit date: 11/15/1996    Years since quitting: 25.5   Smokeless tobacco: Never  Vaping Use   Vaping Use: Never used  Substance and Sexual Activity   Alcohol use: No    Alcohol/week: 0.0  standard drinks of alcohol   Drug use: No    Comment: uses CBD oil    Sexual activity: Never  Other Topics Concern   Not on file  Social History Narrative   Lives alone.  Divorced. Two daughters- in 4s, 3 grandchildren.   Does not know family history- raised in [facility].        Desires CPR.  Does not have HPOA.   She would possibly want life support if for short period of time . Unsure about feeding tube.   Social Determinants of Health   Financial Resource Strain: Low Risk  (09/24/2021)   Overall Financial Resource Strain (CARDIA)    Difficulty of Paying Living Expenses: Not very hard  Food Insecurity: No Food Insecurity (09/24/2021)   Hunger Vital Sign    Worried About Running Out of Food in the Last Year: Never true    Ran Out of Food in the Last Year: Never true  Transportation Needs: No Transportation Needs (09/24/2021)   PRAPARE - Hydrologist (Medical): No    Lack of Transportation (Non-Medical): No  Physical Activity: Inactive (09/24/2021)   Exercise Vital Sign    Days of Exercise per Week: 0 days    Minutes of Exercise per Session: 0 min  Stress: No Stress Concern Present (09/24/2021)   [place]    Feeling of Stress : Not at all  Social Connections: Socially Isolated (09/24/2021)   Social Connection and Isolation Panel [NHANES]    Frequency of Communication with Friends and Family: More than three times a week    Frequency of Social Gatherings with Friends and Family: Never    Attends Religious Services: Never    Marine scientist or Organizations: No    Attends Archivist Meetings: Never    Marital Status: Divorced  Human resources officer Violence: Not At Risk (09/24/2021)   Humiliation, Afraid, Rape, and Kick questionnaire    Fear of Current or Ex-Partner: No    Emotionally Abused: No    Physically Abused: No    Sexually Abused: No    Review of  Systems: See HPI, otherwise negative ROS  Physical Exam: BP (!) 140/75   Temp (!) 97.4 F (36.3 C) (Tympanic)   Resp (!) 21   Ht 5' 4.02" (1.626 m)   Wt 106.3 kg   SpO2 97%   BMI 40.21 kg/m  General:   Alert,  pleasant and cooperative in NAD Head:  Normocephalic and atraumatic. Lungs:  Clear to auscultation.    Heart:  Regular rate and rhythm.   Impression/Plan: [name] is here for ophthalmic surgery.  Risks, benefits, limitations,  and alternatives regarding ophthalmic surgery have been reviewed with the patient.  Questions have been answered.  All parties agreeable.   Leandrew Koyanagi, MD  06/02/2022, 11:53 AM

## 2022-06-02 NOTE — Op Note (Signed)
  OPERATIVE NOTE  Kristie Byrd [id number] 06/02/2022   PREOPERATIVE DIAGNOSIS:  Nuclear sclerotic cataract left eye. H25.12   POSTOPERATIVE DIAGNOSIS:    Nuclear sclerotic cataract left eye.     PROCEDURE:  Phacoemusification with posterior chamber intraocular lens placement of the left eye  Ultrasound time: Procedure(s) with comments: CATARACT EXTRACTION PHACO AND INTRAOCULAR LENS PLACEMENT (IOC) LEFT 4.93 52.5 (Left) - sleep apnea  LENS:   Implant Name Type Inv. Item Serial No. Manufacturer Lot No. LRB No. Used Action  LENS IOL TECNIS EYHANCE 21.5 - [id number] Intraocular Lens LENS IOL TECNIS EYHANCE 21.5 [id number] [employer]  Left 1 Implanted      SURGEON:  Wyonia Hough, MD   ANESTHESIA:  Topical with tetracaine drops and 2% Xylocaine jelly, augmented with 1% preservative-free intracameral lidocaine.    COMPLICATIONS:  None.   DESCRIPTION OF PROCEDURE:  The patient was identified in the holding room and transported to the operating room and placed in the supine position under the operating microscope.  The left eye was identified as the operative eye and it was prepped and draped in the usual sterile ophthalmic fashion.   A 1 millimeter clear-corneal paracentesis was made at the 1:30 position.  0.5 ml of preservative-free 1% lidocaine was injected into the anterior chamber.  The anterior chamber was filled with Viscoat viscoelastic.  A 2.4 millimeter keratome was used to make a near-clear corneal incision at the 10:30 position.  .  A curvilinear capsulorrhexis was made with a cystotome and capsulorrhexis forceps.  Balanced salt solution was used to hydrodissect and hydrodelineate the nucleus.   Phacoemulsification was then used in stop and chop fashion to remove the lens nucleus and epinucleus.  The remaining cortex was then removed using the irrigation and aspiration handpiece. Provisc was then placed into the capsular bag to distend it for lens placement.  A lens was  then injected into the capsular bag.  The remaining viscoelastic was aspirated.   Wounds were hydrated with balanced salt solution.  The anterior chamber was inflated to a physiologic pressure with balanced salt solution.  No wound leaks were noted. ,Cefuroxime 0.1 ml of a '10mg'$ /ml solution was injected into the anterior chamber for a dose of 1 mg of intracameral antibiotic at the completion of the case.    Timolol and Brimonidine drops were applied to the eye.  The patient was taken to the recovery room in stable condition without complications of anesthesia or surgery.  [name] 06/02/2022, 1:02 PM

## 2022-06-02 NOTE — Telephone Encounter (Signed)
Third attempt to reach pt. Left message to call back. 

## 2022-06-03 ENCOUNTER — Ambulatory Visit: Payer: Medicare HMO | Admitting: Internal Medicine

## 2022-06-03 ENCOUNTER — Encounter: Payer: Self-pay | Admitting: Ophthalmology

## 2022-06-03 VITALS — BP 134/86 | HR 66 | Temp 97.1°F | Wt 234.0 lb

## 2022-06-03 DIAGNOSIS — N1832 Chronic kidney disease, stage 3b: Secondary | ICD-10-CM

## 2022-06-03 DIAGNOSIS — D631 Anemia in chronic kidney disease: Secondary | ICD-10-CM

## 2022-06-03 DIAGNOSIS — J439 Emphysema, unspecified: Secondary | ICD-10-CM

## 2022-06-03 DIAGNOSIS — M1711 Unilateral primary osteoarthritis, right knee: Secondary | ICD-10-CM

## 2022-06-03 DIAGNOSIS — N3000 Acute cystitis without hematuria: Secondary | ICD-10-CM

## 2022-06-03 DIAGNOSIS — M810 Age-related osteoporosis without current pathological fracture: Secondary | ICD-10-CM

## 2022-06-03 DIAGNOSIS — F32A Depression, unspecified: Secondary | ICD-10-CM

## 2022-06-03 DIAGNOSIS — E781 Pure hyperglyceridemia: Secondary | ICD-10-CM | POA: Diagnosis not present

## 2022-06-03 DIAGNOSIS — N183 Chronic kidney disease, stage 3 unspecified: Secondary | ICD-10-CM

## 2022-06-03 DIAGNOSIS — G4733 Obstructive sleep apnea (adult) (pediatric): Secondary | ICD-10-CM

## 2022-06-03 DIAGNOSIS — G250 Essential tremor: Secondary | ICD-10-CM

## 2022-06-03 DIAGNOSIS — Z6841 Body Mass Index (BMI) 40.0 and over, adult: Secondary | ICD-10-CM

## 2022-06-03 DIAGNOSIS — K219 Gastro-esophageal reflux disease without esophagitis: Secondary | ICD-10-CM

## 2022-06-03 DIAGNOSIS — I10 Essential (primary) hypertension: Secondary | ICD-10-CM

## 2022-06-03 DIAGNOSIS — F419 Anxiety disorder, unspecified: Secondary | ICD-10-CM

## 2022-06-03 LAB — POCT URINALYSIS DIPSTICK
Bilirubin, UA: NEGATIVE
Glucose, UA: NEGATIVE
Ketones, UA: NEGATIVE
Nitrite, UA: NEGATIVE
Protein, UA: POSITIVE — AB
Spec Grav, UA: <=1.005 — AB (ref 1.010–1.025)
Urobilinogen, UA: 0.2 E.U./dL
pH, UA: 6 (ref 5.0–8.0)

## 2022-06-03 MED ORDER — NITROFURANTOIN MONOHYD MACRO 100 MG PO CAPS: 100.0000 mg | ORAL_CAPSULE | Freq: Two times a day (BID) | ORAL | 0 refills | Status: DC

## 2022-06-03 MED ORDER — ALBUTEROL SULFATE (2.5 MG/3ML) 0.083% IN NEBU: 2.5000 mg | INHALATION_SOLUTION | Freq: Four times a day (QID) | RESPIRATORY_TRACT | 1 refills | Status: DC | PRN

## 2022-06-03 NOTE — Assessment & Plan Note (Signed)
Encourage diet and exercise for weight loss 

## 2022-06-03 NOTE — Assessment & Plan Note (Signed)
C-Met and lipid profile today Encouraged her to consume low-fat diet 

## 2022-06-03 NOTE — Patient Instructions (Signed)
Urinary Tract Infection, Adult  A urinary tract infection (UTI) is an infection of any part of the urinary tract. The urinary tract includes the kidneys, ureters, bladder, and urethra. These organs make, store, and get rid of urine in the body. An upper UTI affects the ureters and kidneys. A lower UTI affects the bladder and urethra. What are the causes? Most urinary tract infections are caused by bacteria in your genital area around your urethra, where urine leaves your body. These bacteria grow and cause inflammation of your urinary tract. What increases the risk? You are more likely to develop this condition if: You have a urinary catheter that stays in place. You are not able to control when you urinate or have a bowel movement (incontinence). You are female and you: Use a spermicide or diaphragm for birth control. Have low estrogen levels. Are pregnant. You have certain genes that increase your risk. You are sexually active. You take antibiotic medicines. You have a condition that causes your flow of urine to slow down, such as: An enlarged prostate, if you are female. Blockage in your urethra. A kidney stone. A nerve condition that affects your bladder control (neurogenic bladder). Not getting enough to drink, or not urinating often. You have certain medical conditions, such as: Diabetes. A weak disease-fighting system (immunesystem). Sickle cell disease. Gout. Spinal cord injury. What are the signs or symptoms? Symptoms of this condition include: Needing to urinate right away (urgency). Frequent urination. This may include small amounts of urine each time you urinate. Pain or burning with urination. Blood in the urine. Urine that smells bad or unusual. Trouble urinating. Cloudy urine. Vaginal discharge, if you are female. Pain in the abdomen or the lower back. You may also have: Vomiting or a decreased appetite. Confusion. Irritability or tiredness. A fever or  chills. Diarrhea. The first symptom in older adults may be confusion. In some cases, they may not have any symptoms until the infection has worsened. How is this diagnosed? This condition is diagnosed based on your medical history and a physical exam. You may also have other tests, including: Urine tests. Blood tests. Tests for STIs (sexually transmitted infections). If you have had more than one UTI, a cystoscopy or imaging studies may be done to determine the cause of the infections. How is this treated? Treatment for this condition includes: Antibiotic medicine. Over-the-counter medicines to treat discomfort. Drinking enough water to stay hydrated. If you have frequent infections or have other conditions such as a kidney stone, you may need to see a health care provider who specializes in the urinary tract (urologist). In rare cases, urinary tract infections can cause sepsis. Sepsis is a life-threatening condition that occurs when the body responds to an infection. Sepsis is treated in the hospital with IV antibiotics, fluids, and other medicines. Follow these instructions at home:  Medicines Take over-the-counter and prescription medicines only as told by your health care provider. If you were prescribed an antibiotic medicine, take it as told by your health care provider. Do not stop using the antibiotic even if you start to feel better. General instructions Make sure you: Empty your bladder often and completely. Do not hold urine for long periods of time. Empty your bladder after sex. Wipe from front to back after urinating or having a bowel movement if you are female. Use each tissue only one time when you wipe. Drink enough fluid to keep your urine pale yellow. Keep all follow-up visits. This is important. Contact a health   care provider if: Your symptoms do not get better after 1-2 days. Your symptoms go away and then return. Get help right away if: You have severe pain in  your back or your lower abdomen. You have a fever or chills. You have nausea or vomiting. Summary A urinary tract infection (UTI) is an infection of any part of the urinary tract, which includes the kidneys, ureters, bladder, and urethra. Most urinary tract infections are caused by bacteria in your genital area. Treatment for this condition often includes antibiotic medicines. If you were prescribed an antibiotic medicine, take it as told by your health care provider. Do not stop using the antibiotic even if you start to feel better. Keep all follow-up visits. This is important. This information is not intended to replace advice given to you by your health care provider. Make sure you discuss any questions you have with your health care provider. Document Revised: 12/14/2019 Document Reviewed: 12/14/2019 Elsevier Patient Education  2023 [employer]  

## 2022-06-03 NOTE — Progress Notes (Signed)
Subjective:    Patient ID: Kristie Byrd, female    DOB: 03/12/1946, 77 y.o.   MRN: [id number]  HPI  Patient presents to clinic today for follow-up of chronic conditions.  HTN: Her BP today is 134/86.  She is taking Lisinopril and Propranolol as prescribed.  ECG from 04/2016 reviewed.  COPD: She has intermittent shortness of breath but denies chronic cough..  She is taking Breztri as prescribed but is not taking Albuterol as needed.  PFTs from 11/2013 reviewed.  OSA: She averages 7 hours of sleep per night without the use of her CPAP.  Sleep study from 02/2022 reviewed.  GERD: Triggered by tomato based foods. She does not take anything OTC for this.  She takes as needed with good relief of symptoms.  There is no upper GI on file.  HLD: Her last LDL was 93, triglycerides 160, 09/2021.  She is not taking any cholesterol-lowering medication at this time.  She does not consume a low-fat diet.  Tremor: Managed with Propranolol.  She does not follow with neurology.  OA: Mainly in her back.  She takes Tylenol as needed with good relief of symptoms.  She does not follow with orthopedics.  CKD 3: Her last creatinine was 1.38, GFR 40, 04/2022.  She is on Lisinopril for renal protection.  She does not follow with nephrology.  Anemia: Her last H/H was normal 04/2022.  She follows with hematology.  Anxiety and Depression: Chronic, managed on Sertraline and Bupropion.  She is not currently seeing a therapist.  She denies SI/HI.  Osteoporosis: She is taking Calcium and Vitamin D OTC.  She does not get weightbearing exercise.  Bone density from 09/2020 reviewed.  She also reports pressure with urination.  She reports this started 3 days ago.  She denies urgency, frequency, dysuria or blood in her urine.  She denies fever, chills, nausea or vomiting.  She has tried Cranberry juice OTC with minimal relief of symptoms.  Review of Systems     Past Medical History:  Diagnosis Date   Arthritis    CKD  (chronic kidney disease), stage III (HCC)    Colon polyps    COPD (chronic obstructive pulmonary disease) (HCC)    Hypertension    Iron deficiency anemia 09/27/2012   PONV (postoperative nausea and vomiting)    Shingles    Sleep apnea    starting CPAP in 2024   Uses walker    or cane   Wears dentures    full upper and lower   Wears hearing aid in right ear     Current Outpatient Medications  Medication Sig Dispense Refill   acetaminophen (TYLENOL) 650 MG CR tablet Take 1,000 mg by mouth every 4 (four) hours as needed.     albuterol (VENTOLIN HFA) 108 (90 Base) MCG/ACT inhaler INHALE 2 PUFFS EVERY 6 HOURS AS NEEDED FOR WHEEZING OR SHORTNESS OF BREATH. 1 each 0   Budeson-Glycopyrrol-Formoterol (BREZTRI AEROSPHERE) 160-9-4.8 MCG/ACT AERO Inhale 2 puffs into the lungs 2 (two) times daily. 10.7 g 11   buPROPion (WELLBUTRIN XL) 300 MG 24 hr tablet TAKE 1 TABLET EVERY DAY 90 tablet 0   calcitRIOL (ROCALTROL) 0.25 MCG capsule Take 0.25 mcg by mouth daily.     Cholecalciferol (VITAMIN D3) 1000 units CAPS Take 1,000 Units by mouth daily.     ferrous sulfate 325 (65 FE) MG EC tablet Take 325 mg by mouth 3 (three) times daily with meals.     fluticasone (FLONASE) 50 MCG/ACT  nasal spray Place 1 spray into both nostrils as needed for allergies or rhinitis.     lisinopril (ZESTRIL) 5 MG tablet TAKE 1 TABLET EVERY DAY 90 tablet 0   Multiple Vitamin (MULTIVITAMIN) tablet Take 1 tablet by mouth at bedtime.      propranolol (INDERAL) 40 MG tablet TAKE 1 TABLET TWICE DAILY 180 tablet 0   sertraline (ZOLOFT) 100 MG tablet TAKE 1 TABLET EVERY DAY 90 tablet 0   No current facility-administered medications for this visit.    Allergies  Allergen Reactions   Codeine Nausea And Vomiting   Demerol [Meperidine] Nausea And Vomiting   Fleet Phospho-Soda [Sodium Phosphates] Nausea And Vomiting    Vomiting    Latex Rash    Blisters   Other Other (See Comments)    Band-Aid, blister   Prednisone Other (See  Comments)    Chest pressure    Family History  Problem Relation Age of Onset   Colon cancer Daughter     Social History   Socioeconomic History   Marital status: Divorced    Spouse name: Not on file   Number of children: 2   Years of education: Not on file   Highest education level: Not on file  Occupational History   Occupation: retired  Tobacco Use   Smoking status: Former    Packs/day: 1.00    Years: 37.00    Total pack years: 37.00    Types: Cigarettes    Quit date: 11/15/1996    Years since quitting: 25.5   Smokeless tobacco: Never  Vaping Use   Vaping Use: Never used  Substance and Sexual Activity   Alcohol use: No    Alcohol/week: 0.0 standard drinks of alcohol   Drug use: No    Comment: uses CBD oil    Sexual activity: Never  Other Topics Concern   Not on file  Social History Narrative   Lives alone.  Divorced. Two daughters- in 1s, 3 grandchildren.   Does not know family history- raised in [facility].        Desires CPR.  Does not have HPOA.   She would possibly want life support if for short period of time . Unsure about feeding tube.   Social Determinants of Health   Financial Resource Strain: Low Risk  (09/24/2021)   Overall Financial Resource Strain (CARDIA)    Difficulty of Paying Living Expenses: Not very hard  Food Insecurity: No Food Insecurity (09/24/2021)   Hunger Vital Sign    Worried About Running Out of Food in the Last Year: Never true    Ran Out of Food in the Last Year: Never true  Transportation Needs: No Transportation Needs (09/24/2021)   PRAPARE - Hydrologist (Medical): No    Lack of Transportation (Non-Medical): No  Physical Activity: Inactive (09/24/2021)   Exercise Vital Sign    Days of Exercise per Week: 0 days    Minutes of Exercise per Session: 0 min  Stress: No Stress Concern Present (09/24/2021)   [place]     Feeling of Stress : Not at all  Social Connections: Socially Isolated (09/24/2021)   Social Connection and Isolation Panel [NHANES]    Frequency of Communication with Friends and Family: More than three times a week    Frequency of Social Gatherings with Friends and Family: Never    Attends Religious Services: Never    Retail buyer of [employer]  or Organizations: No    Attends Archivist Meetings: Never    Marital Status: Divorced  Human resources officer Violence: Not At Risk (09/24/2021)   Humiliation, Afraid, Rape, and Kick questionnaire    Fear of Current or Ex-Partner: No    Emotionally Abused: No    Physically Abused: No    Sexually Abused: No     Constitutional: Denies fever, malaise, fatigue, headache or abrupt weight changes.  HEENT: Denies eye pain, eye redness, ear pain, ringing in the ears, wax buildup, runny nose, nasal congestion, bloody nose, or sore throat. Respiratory: Denies difficulty breathing, shortness of breath, cough or sputum production.   Cardiovascular: Denies chest pain, chest tightness, palpitations or swelling in the hands or feet.  Gastrointestinal: Patient reports intermittent reflux.  Denies abdominal pain, bloating, constipation, diarrhea or blood in the stool.  GU: Patient reports bladder pressure.  Denies urgency, frequency, pain with urination, burning sensation, blood in urine, odor or discharge. Musculoskeletal: Patient reports joint pain.  Denies decrease in range of motion, difficulty with gait, muscle pain or joint swelling.  Skin: Denies redness, rashes, lesions or ulcercations.  Neurological: Patient's daughter reports difficulty with memory.  Denies dizziness, difficulty with speech or problems with balance and coordination.  Psych: Patient has a history of anxiety and depression.  Denies SI/HI.  No other specific complaints in a complete review of systems (except as listed in HPI above).  Objective:   Physical Exam   BP 134/86 (BP  Location: Right Arm, Patient Position: Sitting, Cuff Size: Normal)   Pulse 66   Temp (!) 97.1 F (36.2 C) (Temporal)   Wt 234 lb (106.1 kg)   SpO2 97%   BMI 40.15 kg/m   Wt Readings from Last 3 Encounters:  06/02/22 234 lb 6.4 oz (106.3 kg)  05/19/22 234 lb (106.1 kg)  05/07/22 230 lb (104.3 kg)    General: Appears her stated age, obese, in NAD. Skin: Warm, dry and intact. No ulcerations noted. HEENT: Head: normal shape and size; Eyes: sclera white, no icterus, conjunctiva pink, PERRLA and EOMs intact;  Cardiovascular: Normal rate and rhythm.  Pulmonary/Chest: Normal effort and positive vesicular breath sounds. No respiratory distress. No wheezes, rales or ronchi noted.  Abdomen: Soft and nontender over the bladder.  No CVA tenderness noted. Musculoskeletal: Gait slow and steady with use of rolling walker. Neurological: Alert and oriented.  Coordination normal.  Psychiatric: Mood and affect normal. Behavior is normal. Judgment and thought content normal.  BMET    Component Value Date/Time   NA 138 04/19/2022 1255   NA 137 02/25/2014 1404   K 4.7 04/19/2022 1255   K 4.2 02/25/2014 1404   CL 100 04/19/2022 1255   CL 102 02/25/2014 1404   CO2 30 04/19/2022 1255   CO2 28 02/25/2014 1404   GLUCOSE 112 (H) 04/19/2022 1255   GLUCOSE 124 (H) 02/25/2014 1404   BUN 25 (H) 04/19/2022 1255   BUN 20 02/10/2017 0000   BUN 24 (H) 02/25/2014 1404   CREATININE 1.38 (H) 04/19/2022 1255   CREATININE 1.00 09/24/2021 1514   CALCIUM 9.3 04/19/2022 1255   CALCIUM 9.7 04/06/2021 1322   GFRNONAA 40 (L) 04/19/2022 1255   GFRNONAA 44 (L) 02/25/2014 1404   GFRNONAA 45 (L) 10/26/2013 1330   GFRAA 56 (L) 10/03/2019 1324   GFRAA 54 (L) 02/25/2014 1404   GFRAA 52 (L) 10/26/2013 1330    Lipid Panel     Component Value Date/Time   CHOL 186 09/24/2021 1514  TRIG 160 (H) 09/24/2021 1514   HDL 63 09/24/2021 1514   CHOLHDL 3.0 09/24/2021 1514   VLDL 37 04/02/2020 1242   LDLCALC 97 09/24/2021  1514    CBC    Component Value Date/Time   WBC 5.7 04/19/2022 1255   RBC 4.07 04/19/2022 1255   HGB 12.2 04/19/2022 1255   HGB 11.4 (L) 02/25/2014 1404   HCT 38.1 04/19/2022 1255   HCT 34.9 (L) 02/25/2014 1404   PLT 252 04/19/2022 1255   PLT 256 02/25/2014 1404   MCV 93.6 04/19/2022 1255   MCV 89 02/25/2014 1404   MCH 30.0 04/19/2022 1255   MCHC 32.0 04/19/2022 1255   RDW 13.6 04/19/2022 1255   RDW 14.7 (H) 02/25/2014 1404   LYMPHSABS 1.0 04/19/2022 1255   LYMPHSABS 1.1 02/25/2014 1404   MONOABS 0.5 04/19/2022 1255   MONOABS 0.5 02/25/2014 1404   EOSABS 0.0 04/19/2022 1255   EOSABS 0.0 02/25/2014 1404   BASOSABS 0.0 04/19/2022 1255   BASOSABS 0.1 02/25/2014 1404    Hgb A1C Lab Results  Component Value Date   HGBA1C 5.5 09/24/2021           Assessment & Plan:    Bladder Pressure:  Urinalysis concerning for infection We will send urine culture Rx for Macrobid 100 mg twice daily x 5 days Push fluids  RTC in 6 months for your annual exam Webb Silversmith, NP

## 2022-06-03 NOTE — Assessment & Plan Note (Signed)
Encourage weight loss as this can produce sleep apnea symptoms She has not picked up her CPAP machine yet

## 2022-06-03 NOTE — Assessment & Plan Note (Signed)
Controlled on lisinopril and propranolol Reinforced diet and exercise weight loss C-Met today

## 2022-06-03 NOTE — Assessment & Plan Note (Signed)
Encourage weight loss as this can help reduce joint pain Okay to continue Tylenol OTC as needed

## 2022-06-03 NOTE — Assessment & Plan Note (Signed)
Stable on sertraline and bupropion Support offered

## 2022-06-03 NOTE — Assessment & Plan Note (Signed)
Avoid foods that trigger reflux Encourage weight loss as this can help reduce reflux symptoms Okay to take Tums OTC as needed

## 2022-06-03 NOTE — Assessment & Plan Note (Signed)
Continue calcium and vitamin D Encourage daily weightbearing exercise 

## 2022-06-03 NOTE — Assessment & Plan Note (Signed)
-

## 2022-06-03 NOTE — Assessment & Plan Note (Signed)
Continue Breztri Does not tolerate albuterol inhaler well Rx for DME nebulizer machine and albuterol nebulizer solution

## 2022-06-03 NOTE — Assessment & Plan Note (Signed)
Continue lisinopril for renal protection 

## 2022-06-03 NOTE — Assessment & Plan Note (Signed)
Follows with hematology

## 2022-06-04 LAB — LIPID PANEL
Cholesterol: 181 mg/dL (ref <200)
HDL: 66 mg/dL (ref >=50)
LDL Cholesterol (Calc): 95 mg/dL (calc)
Non-HDL Cholesterol (Calc): 115 mg/dL (calc) (ref <130)
Total CHOL/HDL Ratio: 2.7 (calc) (ref <5.0)
Triglycerides: 102 mg/dL (ref <150)

## 2022-06-05 LAB — URINE CULTURE
MICRO NUMBER:: 14444244
SPECIMEN QUALITY:: ADEQUATE

## 2022-06-08 ENCOUNTER — Other Ambulatory Visit: Payer: Self-pay | Admitting: Internal Medicine

## 2022-06-08 DIAGNOSIS — N3000 Acute cystitis without hematuria: Secondary | ICD-10-CM

## 2022-06-09 ENCOUNTER — Encounter: Payer: Medicare HMO | Admitting: Internal Medicine

## 2022-07-26 ENCOUNTER — Encounter: Payer: Self-pay | Admitting: Internal Medicine

## 2022-07-26 ENCOUNTER — Ambulatory Visit (INDEPENDENT_AMBULATORY_CARE_PROVIDER_SITE_OTHER): Payer: Medicare HMO | Admitting: Internal Medicine

## 2022-07-26 VITALS — BP 146/82 | HR 81 | Temp 96.1°F | Wt 237.0 lb

## 2022-07-26 DIAGNOSIS — G44201 Tension-type headache, unspecified, intractable: Secondary | ICD-10-CM | POA: Diagnosis not present

## 2022-07-26 DIAGNOSIS — R42 Dizziness and giddiness: Secondary | ICD-10-CM | POA: Diagnosis not present

## 2022-07-26 DIAGNOSIS — I10 Essential (primary) hypertension: Secondary | ICD-10-CM

## 2022-07-26 MED ORDER — LISINOPRIL 20 MG PO TABS: 20.0000 mg | ORAL_TABLET | Freq: Every day | ORAL | 0 refills | Status: DC

## 2022-07-26 NOTE — Patient Instructions (Signed)

## 2022-07-26 NOTE — Progress Notes (Signed)
Subjective:    Patient ID: Kristie Byrd, female    DOB: January 27, 1946, 77 y.o.   MRN: [id number]  HPI  Patient presents to clinic today with complaint of headache.  This started about 1 week ago. The headache is located in the back of her neck. She describes the pain as dull and achy.  She reports associated neck pain, lightheadedness.  She denies sensitivity to light, sound, chest pain, nausea or vomiting. She denies numbness, tingling or weakness of her upper extremities.  She does feel like she has a lot of tension in her neck.  She is concerned that it may be related to her blood pressure.  She currently takes Lisinopril and Propranolol but admits that she is not consistent with this.  Her BP today is 158/94.  ECG from 04/2016 reviewed.  Review of Systems     Past Medical History:  Diagnosis Date   Arthritis    CKD (chronic kidney disease), stage III (HCC)    Colon polyps    COPD (chronic obstructive pulmonary disease) (HCC)    Hypertension    Iron deficiency anemia 09/27/2012   PONV (postoperative nausea and vomiting)    Shingles    Sleep apnea    starting CPAP in 2024   Uses walker    or cane   Wears dentures    full upper and lower   Wears hearing aid in right ear     Current Outpatient Medications  Medication Sig Dispense Refill   acetaminophen (TYLENOL) 650 MG CR tablet Take 1,000 mg by mouth every 4 (four) hours as needed.     albuterol (PROVENTIL) (2.5 MG/3ML) 0.083% nebulizer solution Take 3 mLs (2.5 mg total) by nebulization every 6 (six) hours as needed for wheezing or shortness of breath. 150 mL 1   Budeson-Glycopyrrol-Formoterol (BREZTRI AEROSPHERE) 160-9-4.8 MCG/ACT AERO Inhale 2 puffs into the lungs 2 (two) times daily. 10.7 g 11   buPROPion (WELLBUTRIN XL) 300 MG 24 hr tablet TAKE 1 TABLET EVERY DAY 90 tablet 0   calcitRIOL (ROCALTROL) 0.25 MCG capsule Take 0.25 mcg by mouth daily.     Cholecalciferol (VITAMIN D3) 1000 units CAPS Take 1,000 Units by mouth  daily.     ferrous sulfate 325 (65 FE) MG EC tablet Take 325 mg by mouth 3 (three) times daily with meals.     fluticasone (FLONASE) 50 MCG/ACT nasal spray Place 1 spray into both nostrils as needed for allergies or rhinitis.     lisinopril (ZESTRIL) 5 MG tablet TAKE 1 TABLET EVERY DAY 90 tablet 0   Multiple Vitamin (MULTIVITAMIN) tablet Take 1 tablet by mouth at bedtime.      nitrofurantoin, macrocrystal-monohydrate, (MACROBID) 100 MG capsule Take 1 capsule (100 mg total) by mouth 2 (two) times daily. 10 capsule 0   propranolol (INDERAL) 40 MG tablet TAKE 1 TABLET TWICE DAILY 180 tablet 0   sertraline (ZOLOFT) 100 MG tablet TAKE 1 TABLET EVERY DAY 90 tablet 0   No current facility-administered medications for this visit.    Allergies  Allergen Reactions   Codeine Nausea And Vomiting   Demerol [Meperidine] Nausea And Vomiting   Fleet Phospho-Soda [Sodium Phosphates] Nausea And Vomiting    Vomiting    Latex Rash    Blisters   Other Other (See Comments)    Band-Aid, blister   Prednisone Other (See Comments)    Chest pressure    Family History  Problem Relation Age of Onset   Colon cancer Daughter  Social History   Socioeconomic History   Marital status: Divorced    Spouse name: Not on file   Number of children: 2   Years of education: Not on file   Highest education level: Not on file  Occupational History   Occupation: retired  Tobacco Use   Smoking status: Former    Packs/day: 1.00    Years: 37.00    Total pack years: 37.00    Types: Cigarettes    Quit date: 11/15/1996    Years since quitting: 25.7   Smokeless tobacco: Never  Vaping Use   Vaping Use: Never used  Substance and Sexual Activity   Alcohol use: No    Alcohol/week: 0.0 standard drinks of alcohol   Drug use: No    Comment: uses CBD oil    Sexual activity: Never  Other Topics Concern   Not on file  Social History Narrative   Lives alone.  Divorced. Two daughters- in 73s, 3 grandchildren.   Does  not know family history- raised in [facility].        Desires CPR.  Does not have HPOA.   She would possibly want life support if for short period of time . Unsure about feeding tube.   Social Determinants of Health   Financial Resource Strain: Low Risk  (09/24/2021)   Overall Financial Resource Strain (CARDIA)    Difficulty of Paying Living Expenses: Not very hard  Food Insecurity: No Food Insecurity (09/24/2021)   Hunger Vital Sign    Worried About Running Out of Food in the Last Year: Never true    Ran Out of Food in the Last Year: Never true  Transportation Needs: No Transportation Needs (09/24/2021)   PRAPARE - Hydrologist (Medical): No    Lack of Transportation (Non-Medical): No  Physical Activity: Inactive (09/24/2021)   Exercise Vital Sign    Days of Exercise per Week: 0 days    Minutes of Exercise per Session: 0 min  Stress: No Stress Concern Present (09/24/2021)   [place]    Feeling of Stress : Not at all  Social Connections: Socially Isolated (09/24/2021)   Social Connection and Isolation Panel [NHANES]    Frequency of Communication with Friends and Family: More than three times a week    Frequency of Social Gatherings with Friends and Family: Never    Attends Religious Services: Never    Marine scientist or Organizations: No    Attends Archivist Meetings: Never    Marital Status: Divorced  Human resources officer Violence: Not At Risk (09/24/2021)   Humiliation, Afraid, Rape, and Kick questionnaire    Fear of Current or Ex-Partner: No    Emotionally Abused: No    Physically Abused: No    Sexually Abused: No     Constitutional: Patient reports headache.  Denies fever, malaise, fatigue, or abrupt weight changes.  HEENT: Denies eye pain, eye redness, ear pain, ringing in the ears, wax buildup, runny nose, nasal congestion, bloody nose, or sore  throat. Respiratory: Denies difficulty breathing, shortness of breath, cough or sputum production.   Cardiovascular: Denies chest pain, chest tightness, palpitations or swelling in the hands or feet.  Musculoskeletal: Patient reports neck pain, difficulty with gait.  Denies decrease in range of motion, or joint swelling.  Skin: Denies redness, rashes, lesions or ulcercations.  Neurological: Denies dizziness, difficulty with memory, difficulty with speech, numbness, tingling, weakness or  problems with coordination.  Psych: Patient reports stress at work, has a history of anxiety and depression.  Denies SI/HI.  No other specific complaints in a complete review of systems (except as listed in HPI above).  Objective:   Physical Exam  BP (!) 146/82 (BP Location: Left Arm, Patient Position: Sitting, Cuff Size: Large)   Pulse 81   Temp (!) 96.1 F (35.6 C) (Temporal)   Wt 237 lb (107.5 kg)   SpO2 97%   BMI 40.66 kg/m   Wt Readings from Last 3 Encounters:  07/26/22 237 lb (107.5 kg)  06/03/22 234 lb (106.1 kg)  06/02/22 234 lb 6.4 oz (106.3 kg)    General: Appears her stated age, obese, chronically ill-appearing, in NAD. HEENT: Head: normal shape and size; Eyes: sclera white, no icterus, conjunctiva pink, PERRLA and EOMs intact;  Cardiovascular: Normal rate and rhythm. S1,S2 noted.  No murmur, rubs or gallops noted. No JVD or BLE edema. No carotid bruits noted. Pulmonary/Chest: Normal effort and positive vesicular breath sounds. No respiratory distress. No wheezes, rales or ronchi noted.  Musculoskeletal: Normal flexion, extension, rotation and lateral bending of the cervical spine.  No bony tenderness over the cervical spine or paracervical muscles.  Shoulder shrug equal.  Gait slow and steady with use of rolling walker. Neurological: Alert and oriented. Coordination normal.  Psychiatric: Mood and affect normal. Behavior is normal. Judgment and thought content normal.    BMET     Component Value Date/Time   NA 138 04/19/2022 1255   NA 137 02/25/2014 1404   K 4.7 04/19/2022 1255   K 4.2 02/25/2014 1404   CL 100 04/19/2022 1255   CL 102 02/25/2014 1404   CO2 30 04/19/2022 1255   CO2 28 02/25/2014 1404   GLUCOSE 112 (H) 04/19/2022 1255   GLUCOSE 124 (H) 02/25/2014 1404   BUN 25 (H) 04/19/2022 1255   BUN 20 02/10/2017 0000   BUN 24 (H) 02/25/2014 1404   CREATININE 1.38 (H) 04/19/2022 1255   CREATININE 1.00 09/24/2021 1514   CALCIUM 9.3 04/19/2022 1255   CALCIUM 9.7 04/06/2021 1322   GFRNONAA 40 (L) 04/19/2022 1255   GFRNONAA 44 (L) 02/25/2014 1404   GFRNONAA 45 (L) 10/26/2013 1330   GFRAA 56 (L) 10/03/2019 1324   GFRAA 54 (L) 02/25/2014 1404   GFRAA 52 (L) 10/26/2013 1330    Lipid Panel     Component Value Date/Time   CHOL 181 06/03/2022 1143   TRIG 102 06/03/2022 1143   HDL 66 06/03/2022 1143   CHOLHDL 2.7 06/03/2022 1143   VLDL 37 04/02/2020 1242   LDLCALC 95 06/03/2022 1143    CBC    Component Value Date/Time   WBC 5.7 04/19/2022 1255   RBC 4.07 04/19/2022 1255   HGB 12.2 04/19/2022 1255   HGB 11.4 (L) 02/25/2014 1404   HCT 38.1 04/19/2022 1255   HCT 34.9 (L) 02/25/2014 1404   PLT 252 04/19/2022 1255   PLT 256 02/25/2014 1404   MCV 93.6 04/19/2022 1255   MCV 89 02/25/2014 1404   MCH 30.0 04/19/2022 1255   MCHC 32.0 04/19/2022 1255   RDW 13.6 04/19/2022 1255   RDW 14.7 (H) 02/25/2014 1404   LYMPHSABS 1.0 04/19/2022 1255   LYMPHSABS 1.1 02/25/2014 1404   MONOABS 0.5 04/19/2022 1255   MONOABS 0.5 02/25/2014 1404   EOSABS 0.0 04/19/2022 1255   EOSABS 0.0 02/25/2014 1404   BASOSABS 0.0 04/19/2022 1255   BASOSABS 0.1 02/25/2014 1404    Hgb  A1C Lab Results  Component Value Date   HGBA1C 5.5 09/24/2021            Assessment & Plan:   Acute Headache, Lightheadedness, HTN, Neck Pain:  Offered x-ray of cervical spine however she would like to hold off at this time, does not really think that the neck pain is  associated Discussed the importance of medication compliance Will increase Lisinopril to 20 mg daily Continue Propranolol as previously prescribed Reinforced DASH diet and exercise for weight loss  RTC in in 2 weeks for follow-up HTN, 4 months for your annual exam Webb Silversmith, NP

## 2022-08-03 ENCOUNTER — Other Ambulatory Visit: Payer: Self-pay | Admitting: Internal Medicine

## 2022-08-03 DIAGNOSIS — J439 Emphysema, unspecified: Secondary | ICD-10-CM

## 2022-08-03 MED ORDER — ALBUTEROL SULFATE (2.5 MG/3ML) 0.083% IN NEBU: 2.5000 mg | INHALATION_SOLUTION | Freq: Four times a day (QID) | RESPIRATORY_TRACT | 1 refills | Status: AC | PRN

## 2022-08-05 ENCOUNTER — Other Ambulatory Visit: Payer: Self-pay | Admitting: Internal Medicine

## 2022-08-05 NOTE — Telephone Encounter (Signed)
Requested medication (s) are due for refill today -no  Requested medication (s) are on the active medication list -yes  Future visit scheduled -yes  Last refill: 09/24/21 10.7g  11RF  Notes to clinic: off protocol- provider review   Requested Prescriptions  Pending Prescriptions Disp [place] 160-9-4.8 MCG/ACT AERO [Pharmacy Med Name: BREZTRI AEROSPHERE 160-9-4.8 MCG/ACT Aerosol] 3 each 3    Sig: INHALE 2 PUFFS TWICE DAILY     Off-Protocol Failed - 08/05/2022  3:22 AM      Failed - Medication not assigned to a protocol, review manually.      Passed - Valid encounter within last 12 months    Recent Outpatient Visits           1 week ago Primary hypertension   [facility] [place], Coralie Keens, NP   2 months ago Age-related osteoporosis without current pathological fracture   [facility] [place], Coralie Keens, NP   3 months ago Dark urine   [facility] [place], Coralie Keens, NP   10 months ago Encounter for general adult medical examination with abnormal findings   [facility] [place], Coralie Keens, NP   1 year ago Burning with urination   [facility] [place], Coralie Keens, NP       Future Appointments             In 4 days Garnette Gunner, Coralie Keens, NP [facility] [place]   In 3 months [place], Coralie Keens, NP [facility] [place] Prescriptions  Pending Prescriptions Disp Refills   BREZTRI AEROSPHERE 160-9-4.8 MCG/ACT [place] Med Name: BREZTRI AEROSPHERE 160-9-4.8 MCG/ACT Aerosol] 3 each 3    Sig: INHALE 2 PUFFS TWICE DAILY     Off-Protocol Failed - 08/05/2022  3:22 AM      Failed - Medication not assigned to a protocol, review manually.      Passed - Valid encounter within last 12 months    Recent Outpatient Visits           1 week ago Primary hypertension   [facility] [place], Coralie Keens, NP   2 months ago Age-related osteoporosis without current pathological fracture   [facility] [place], Coralie Keens, NP   3 months ago Dark urine   [facility] [place], Coralie Keens, NP   10 months ago Encounter for general adult medical examination with abnormal findings   [facility] [place], Coralie Keens, NP   1 year ago Burning with urination   [facility] [place], Coralie Keens, NP       Future Appointments             In 4 days [place], Coralie Keens, NP [facility] [place]   In 3 months [place], Coralie Keens, NP [facility], [place]

## 2022-08-09 ENCOUNTER — Ambulatory Visit (INDEPENDENT_AMBULATORY_CARE_PROVIDER_SITE_OTHER): Payer: Medicare HMO | Admitting: Internal Medicine

## 2022-08-09 ENCOUNTER — Encounter: Payer: Self-pay | Admitting: Internal Medicine

## 2022-08-09 VITALS — BP 118/66 | HR 76 | Temp 96.8°F | Wt 237.0 lb

## 2022-08-09 DIAGNOSIS — Z6841 Body Mass Index (BMI) 40.0 and over, adult: Secondary | ICD-10-CM | POA: Diagnosis not present

## 2022-08-09 DIAGNOSIS — I10 Essential (primary) hypertension: Secondary | ICD-10-CM | POA: Diagnosis not present

## 2022-08-09 NOTE — Assessment & Plan Note (Signed)
Encourage diet and exercise for weight loss 

## 2022-08-09 NOTE — Progress Notes (Signed)
Subjective:    Patient ID: Kristie Byrd, female    DOB: 1945-08-27, 77 y.o.   MRN: [id number]  HPI  Patient presents to clinic today for 2-week follow-up of HTN.  At her last visit, her Lisinopril was increased to 20 mg daily and she was advised to continue her Propranolol.  She has been taking the medication as prescribed.  Her BP today is 118/66.  ECG from 04/2016 reviewed.  Review of Systems     Past Medical History:  Diagnosis Date   Arthritis    CKD (chronic kidney disease), stage III (HCC)    Colon polyps    COPD (chronic obstructive pulmonary disease) (HCC)    Hypertension    Iron deficiency anemia 09/27/2012   PONV (postoperative nausea and vomiting)    Shingles    Sleep apnea    starting CPAP in 2024   Uses walker    or cane   Wears dentures    full upper and lower   Wears hearing aid in right ear     Current Outpatient Medications  Medication Sig Dispense Refill   acetaminophen (TYLENOL) 650 MG CR tablet Take 1,000 mg by mouth every 4 (four) hours as needed.     albuterol (PROVENTIL) (2.5 MG/3ML) 0.083% nebulizer solution Take 3 mLs (2.5 mg total) by nebulization every 6 (six) hours as needed for wheezing or shortness of breath. 150 mL 1   Budeson-Glycopyrrol-Formoterol (BREZTRI AEROSPHERE) 160-9-4.8 MCG/ACT AERO INHALE 2 PUFFS TWICE DAILY 3 each 0   buPROPion (WELLBUTRIN XL) 300 MG 24 hr tablet TAKE 1 TABLET EVERY DAY 90 tablet 0   calcitRIOL (ROCALTROL) 0.25 MCG capsule Take 0.25 mcg by mouth daily.     Cholecalciferol (VITAMIN D3) 1000 units CAPS Take 1,000 Units by mouth daily.     ferrous sulfate 325 (65 FE) MG EC tablet Take 325 mg by mouth 3 (three) times daily with meals.     fluticasone (FLONASE) 50 MCG/ACT nasal spray Place 1 spray into both nostrils as needed for allergies or rhinitis.     lisinopril (ZESTRIL) 20 MG tablet Take 1 tablet (20 mg total) by mouth daily. 90 tablet 0   Multiple Vitamin (MULTIVITAMIN) tablet Take 1 tablet by mouth at  bedtime.      propranolol (INDERAL) 40 MG tablet TAKE 1 TABLET TWICE DAILY 180 tablet 0   sertraline (ZOLOFT) 100 MG tablet TAKE 1 TABLET EVERY DAY 90 tablet 0   No current facility-administered medications for this visit.    Allergies  Allergen Reactions   Codeine Nausea And Vomiting   Demerol [Meperidine] Nausea And Vomiting   Fleet Phospho-Soda [Sodium Phosphates] Nausea And Vomiting    Vomiting    Latex Rash    Blisters   Other Other (See Comments)    Band-Aid, blister   Prednisone Other (See Comments)    Chest pressure    Family History  Problem Relation Age of Onset   Colon cancer Daughter     Social History   Socioeconomic History   Marital status: Divorced    Spouse name: Not on file   Number of children: 2   Years of education: Not on file   Highest education level: Not on file  Occupational History   Occupation: retired  Tobacco Use   Smoking status: Former    Packs/day: 1.00    Years: 37.00    Additional pack years: 0.00    Total pack years: 37.00    Types: Cigarettes  Quit date: 11/15/1996    Years since quitting: 25.7   Smokeless tobacco: Never  Vaping Use   Vaping Use: Never used  Substance and Sexual Activity   Alcohol use: No    Alcohol/week: 0.0 standard drinks of alcohol   Drug use: No    Comment: uses CBD oil    Sexual activity: Never  Other Topics Concern   Not on file  Social History Narrative   Lives alone.  Divorced. Two daughters- in 22s, 3 grandchildren.   Does not know family history- raised in [facility].        Desires CPR.  Does not have HPOA.   She would possibly want life support if for short period of time . Unsure about feeding tube.   Social Determinants of Health   Financial Resource Strain: Low Risk  (09/24/2021)   Overall Financial Resource Strain (CARDIA)    Difficulty of Paying Living Expenses: Not very hard  Food Insecurity: No Food Insecurity (09/24/2021)   Hunger Vital Sign    Worried About  Running Out of Food in the Last Year: Never true    Ran Out of Food in the Last Year: Never true  Transportation Needs: No Transportation Needs (09/24/2021)   PRAPARE - Hydrologist (Medical): No    Lack of Transportation (Non-Medical): No  Physical Activity: Inactive (09/24/2021)   Exercise Vital Sign    Days of Exercise per Week: 0 days    Minutes of Exercise per Session: 0 min  Stress: No Stress Concern Present (09/24/2021)   [place]    Feeling of Stress : Not at all  Social Connections: Socially Isolated (09/24/2021)   Social Connection and Isolation Panel [NHANES]    Frequency of Communication with Friends and Family: More than three times a week    Frequency of Social Gatherings with Friends and Family: Never    Attends Religious Services: Never    Marine scientist or Organizations: No    Attends Archivist Meetings: Never    Marital Status: Divorced  Human resources officer Violence: Not At Risk (09/24/2021)   Humiliation, Afraid, Rape, and Kick questionnaire    Fear of Current or Ex-Partner: No    Emotionally Abused: No    Physically Abused: No    Sexually Abused: No     Constitutional: Denies fever, malaise, fatigue, headache or abrupt weight changes.  Respiratory: Denies difficulty breathing, shortness of breath, cough or sputum production.   Cardiovascular: Denies chest pain, chest tightness, palpitations or swelling in the hands or feet.  Gastrointestinal: Denies abdominal pain, bloating, constipation, diarrhea or blood in the stool.  GU: Denies urgency, frequency, pain with urination, burning sensation, blood in urine, odor or discharge. Musculoskeletal: Denies decrease in range of motion, difficulty with gait, muscle pain or joint pain and swelling.  Neurological: Patient reports a tremor.  Denies dizziness, difficulty with memory, difficulty with speech or problems  with balance and coordination.    No other specific complaints in a complete review of systems (except as listed in HPI above).  Objective:   Physical Exam   BP 118/66 (BP Location: Left Arm, Patient Position: Sitting, Cuff Size: Normal)   Pulse 76   Temp (!) 96.8 F (36 C) (Temporal)   Wt 237 lb (107.5 kg)   SpO2 97%   BMI 40.66 kg/m   Wt Readings from Last 3 Encounters:  07/26/22 237 lb (107.5  kg)  06/03/22 234 lb (106.1 kg)  06/02/22 234 lb 6.4 oz (106.3 kg)    General: Appears her stated age, obese, in NAD. Cardiovascular: Normal rate and rhythm. S1,S2 noted.  No murmur, rubs or gallops noted. No JVD or BLE edema. Pulmonary/Chest: Normal effort and positive vesicular breath sounds. No respiratory distress. No wheezes, rales or ronchi noted.  Musculoskeletal: Gait slow and steady with rolling walker.  Neurological: Alert and oriented.     BMET    Component Value Date/Time   NA 138 04/19/2022 1255   NA 137 02/25/2014 1404   K 4.7 04/19/2022 1255   K 4.2 02/25/2014 1404   CL 100 04/19/2022 1255   CL 102 02/25/2014 1404   CO2 30 04/19/2022 1255   CO2 28 02/25/2014 1404   GLUCOSE 112 (H) 04/19/2022 1255   GLUCOSE 124 (H) 02/25/2014 1404   BUN 25 (H) 04/19/2022 1255   BUN 20 02/10/2017 0000   BUN 24 (H) 02/25/2014 1404   CREATININE 1.38 (H) 04/19/2022 1255   CREATININE 1.00 09/24/2021 1514   CALCIUM 9.3 04/19/2022 1255   CALCIUM 9.7 04/06/2021 1322   GFRNONAA 40 (L) 04/19/2022 1255   GFRNONAA 44 (L) 02/25/2014 1404   GFRNONAA 45 (L) 10/26/2013 1330   GFRAA 56 (L) 10/03/2019 1324   GFRAA 54 (L) 02/25/2014 1404   GFRAA 52 (L) 10/26/2013 1330    Lipid Panel     Component Value Date/Time   CHOL 181 06/03/2022 1143   TRIG 102 06/03/2022 1143   HDL 66 06/03/2022 1143   CHOLHDL 2.7 06/03/2022 1143   VLDL 37 04/02/2020 1242   LDLCALC 95 06/03/2022 1143    CBC    Component Value Date/Time   WBC 5.7 04/19/2022 1255   RBC 4.07 04/19/2022 1255   HGB 12.2  04/19/2022 1255   HGB 11.4 (L) 02/25/2014 1404   HCT 38.1 04/19/2022 1255   HCT 34.9 (L) 02/25/2014 1404   PLT 252 04/19/2022 1255   PLT 256 02/25/2014 1404   MCV 93.6 04/19/2022 1255   MCV 89 02/25/2014 1404   MCH 30.0 04/19/2022 1255   MCHC 32.0 04/19/2022 1255   RDW 13.6 04/19/2022 1255   RDW 14.7 (H) 02/25/2014 1404   LYMPHSABS 1.0 04/19/2022 1255   LYMPHSABS 1.1 02/25/2014 1404   MONOABS 0.5 04/19/2022 1255   MONOABS 0.5 02/25/2014 1404   EOSABS 0.0 04/19/2022 1255   EOSABS 0.0 02/25/2014 1404   BASOSABS 0.0 04/19/2022 1255   BASOSABS 0.1 02/25/2014 1404    Hgb A1C Lab Results  Component Value Date   HGBA1C 5.5 09/24/2021           Assessment & Plan:     RTC in 4 months for annual exam Webb Silversmith, NP

## 2022-08-09 NOTE — Patient Instructions (Signed)
How to Take Your Blood Pressure Blood pressure measures how strongly your blood is pressing against the walls of your arteries. Arteries are blood vessels that carry blood from your heart throughout your body. You can take your blood pressure at home with a machine. You may need to check your blood pressure at home: To check if you have high blood pressure (hypertension). To check your blood pressure over time. To make sure your blood pressure medicine is working. Supplies needed: Blood pressure machine, or monitor. A chair to sit in. This should be a chair where you can sit upright with your back supported. Do not sit on a soft couch or an armchair. Table or desk. Small notebook. Pencil or pen. How to prepare Avoid these things for 30 minutes before checking your blood pressure: Having drinks with caffeine in them, such as coffee or tea. Drinking alcohol. Eating. Smoking. Exercising. Do these things five minutes before checking your blood pressure: Go to the bathroom and pee (urinate). Sit in a chair. Be quiet. Do not talk. How to take your blood pressure Follow the instructions that came with your machine. If you have a digital blood pressure monitor, these may be the instructions: Sit up straight. Place your feet on the floor. Do not cross your ankles or legs. Rest your left arm at the level of your heart. You may rest it on a table, desk, or chair. Pull up your shirt sleeve. Wrap the blood pressure cuff around the upper part of your left arm. The cuff should be 1 inch (2.5 cm) above your elbow. It is best to wrap the cuff around bare skin. Fit the cuff snugly around your arm, but not too tightly. You should be able to place only one finger between the cuff and your arm. Place the cord so that it rests in the bend of your elbow. Press the power button. Sit quietly while the cuff fills with air and loses air. Write down the numbers on the screen. Wait 2-3 minutes and then repeat  steps 1-10. What do the numbers mean? Two numbers make up your blood pressure. The first number is called systolic pressure. The second is called diastolic pressure. An example of a blood pressure reading is "120 over 80" (or 120/80). If you are an adult and do not have a medical condition, use this guide to find out if your blood pressure is normal: Normal First number: below 120. Second number: below 80. Elevated First number: 120-129. Second number: below 80. Hypertension stage 1 First number: 130-139. Second number: 80-89. Hypertension stage 2 First number: 140 or above. Second number: 90 or above. Your blood pressure is above normal even if only the first or only the second number is above normal. Follow these instructions at home: Medicines Take over-the-counter and prescription medicines only as told by your doctor. Tell your doctor if your medicine is causing side effects. General instructions Check your blood pressure as often as your doctor tells you to. Check your blood pressure at the same time every day. Take your monitor to your next doctor's appointment. Your doctor will: Make sure you are using it correctly. Make sure it is working right. Understand what your blood pressure numbers should be. Keep all follow-up visits. General tips You will need a blood pressure machine or monitor. Your doctor can suggest a monitor. You can buy one at a drugstore or online. When choosing one: Choose one with an arm cuff. Choose one that wraps around your   upper arm. Only one finger should fit between your arm and the cuff. Do not choose one that measures your blood pressure from your wrist or finger. Where to find more information American Heart Association: www.heart.org Contact a doctor if: Your blood pressure keeps being high. Your blood pressure is suddenly low. Get help right away if: Your first blood pressure number is higher than 180. Your second blood pressure number is  higher than 120. These symptoms may be an emergency. Do not wait to see if the symptoms will go away. Get help right away. Call 911. Summary Check your blood pressure at the same time every day. Avoid caffeine, alcohol, smoking, and exercise for 30 minutes before checking your blood pressure. Make sure you understand what your blood pressure numbers should be. This information is not intended to replace advice given to you by your health care provider. Make sure you discuss any questions you have with your health care provider. Document Revised: 01/15/2021 Document Reviewed: 01/15/2021 Elsevier Patient Education  2023 Elsevier Inc.  

## 2022-08-09 NOTE — Assessment & Plan Note (Signed)
Controlled on lisinopril and propranolol Reinforced DASH diet and exercise for weight loss

## 2022-08-18 ENCOUNTER — Other Ambulatory Visit: Payer: Self-pay | Admitting: Internal Medicine

## 2022-08-18 DIAGNOSIS — R251 Tremor, unspecified: Secondary | ICD-10-CM

## 2022-08-18 DIAGNOSIS — F32A Depression, unspecified: Secondary | ICD-10-CM

## 2022-08-18 DIAGNOSIS — F419 Anxiety disorder, unspecified: Secondary | ICD-10-CM

## 2022-08-18 NOTE — Telephone Encounter (Signed)
Requested Prescriptions  Pending Prescriptions Disp Refills   lisinopril (ZESTRIL) 5 MG tablet [Pharmacy Med Name: LISINOPRIL 5 MG Tablet] 90 tablet 3    Sig: TAKE 1 TABLET EVERY DAY     Cardiovascular:  ACE Inhibitors Failed - 08/18/2022  3:06 AM      Failed - Cr in normal range and within 180 days    Creat  Date Value Ref Range Status  09/24/2021 1.00 0.60 - 1.00 mg/dL Final   Creatinine, Ser  Date Value Ref Range Status  04/19/2022 1.38 (H) 0.44 - 1.00 mg/dL Final         Passed - K in normal range and within 180 days    Potassium  Date Value Ref Range Status  04/19/2022 4.7 3.5 - 5.1 mmol/L Final  02/25/2014 4.2 3.5 - 5.1 mmol/L Final         Passed - Patient is not pregnant      Passed - Last BP in normal range    BP Readings from Last 1 Encounters:  08/09/22 118/66         Passed - Valid encounter within last 6 months    Recent Outpatient Visits           1 week ago Primary hypertension   [facility] [place], Coralie Keens, NP   3 weeks ago Primary hypertension   [facility] [place], [place], [place]   2 months ago Age-related osteoporosis without current pathological fracture   [facility] [place], Coralie Keens, NP   3 months ago Dark urine   [facility] [place], Coralie Keens, NP   10 months ago Encounter for general adult medical examination with abnormal findings   [facility] [place], Coralie Keens, NP       Future Appointments             In 3 months Baity, Coralie Keens, NP [facility]             propranolol (INDERAL) 40 MG tablet [Pharmacy Med Name: PROPRANOLOL HYDROCHLORIDE 40 MG Tablet] 180 tablet 3    Sig: TAKE 1 TABLET TWICE DAILY     Cardiovascular:  Beta Blockers Passed - 08/18/2022  3:06 AM      Passed - Last BP in normal range    BP Readings from Last 1 Encounters:  08/09/22 118/66         Passed -  Last Heart Rate in normal range    Pulse Readings from Last 1 Encounters:  08/09/22 76         Passed - Valid encounter within last 6 months    Recent Outpatient Visits           1 week ago Primary hypertension   [facility] [place], Coralie Keens, NP   3 weeks ago Primary hypertension   [facility] [place], Coralie Keens, NP   2 months ago Age-related osteoporosis without current pathological fracture   [facility] [place], Coralie Keens, NP   3 months ago Dark urine   [facility] [place], Coralie Keens, NP   10 months ago Encounter for general adult medical examination with abnormal findings   [facility] [place], Coralie Keens, NP       Future Appointments  In 3 months Baity, Coralie Keens, NP [facility]             buPROPion (WELLBUTRIN XL) 300 MG 24 hr tablet [Pharmacy Med Name: BUPROPION HYDROCHLORIDE ER (XL) 300 MG Tablet Extended Release 24 Hour] 90 tablet 3    Sig: TAKE 1 TABLET EVERY DAY     Psychiatry: Antidepressants - bupropion Failed - 08/18/2022  3:06 AM      Failed - Cr in normal range and within 360 days    Creat  Date Value Ref Range Status  09/24/2021 1.00 0.60 - 1.00 mg/dL Final   Creatinine, Ser  Date Value Ref Range Status  04/19/2022 1.38 (H) 0.44 - 1.00 mg/dL Final         Passed - AST in normal range and within 360 days    AST  Date Value Ref Range Status  09/24/2021 16 10 - 35 U/L Final   SGOT(AST)  Date Value Ref Range Status  02/25/2014 15 15 - 37 Unit/L Final         Passed - ALT in normal range and within 360 days    ALT  Date Value Ref Range Status  09/24/2021 12 6 - 29 U/L Final   SGPT (ALT)  Date Value Ref Range Status  02/25/2014 23 U/L Final    Comment:    14-63 NOTE: New Reference Range 12/04/13          Passed - Completed PHQ-2 or PHQ-9 in the last 360 days       Passed - Last BP in normal range    BP Readings from Last 1 Encounters:  08/09/22 118/66         Passed - Valid encounter within last 6 months    Recent Outpatient Visits           1 week ago Primary hypertension   [facility] [place], Coralie Keens, NP   3 weeks ago Primary hypertension   [facility] [place], Coralie Keens, NP   2 months ago Age-related osteoporosis without current pathological fracture   [facility] [place], Coralie Keens, NP   3 months ago Dark urine   [facility] [place], Coralie Keens, NP   10 months ago Encounter for general adult medical examination with abnormal findings   [facility] [place], Coralie Keens, NP       Future Appointments             In 3 months Baity, Coralie Keens, NP [facility]             sertraline (ZOLOFT) 100 MG tablet [Pharmacy Med Name: SERTRALINE HYDROCHLORIDE 100 MG Tablet] 90 tablet 3    Sig: TAKE 1 [place]     Psychiatry:  Antidepressants - SSRI - sertraline Passed - 08/18/2022  3:06 AM      Passed - AST in normal range and within 360 days    AST  Date Value Ref Range Status  09/24/2021 16 10 - 35 U/L Final   SGOT(AST)  Date Value Ref Range Status  02/25/2014 15 15 - 37 Unit/L Final         Passed - ALT in normal range and within 360 days    ALT  Date Value Ref Range Status  09/24/2021 12 6 - 29 U/L Final  SGPT (ALT)  Date Value Ref Range Status  02/25/2014 23 U/L Final    Comment:    14-63 NOTE: New Reference Range 12/04/13          Passed - Completed PHQ-2 or PHQ-9 in the last 360 days      Passed - Valid encounter within last 6 months    Recent Outpatient Visits           1 week ago Primary hypertension   [facility] [place], Coralie Keens, NP   3 weeks ago Primary hypertension   [facility] [place],  Coralie Keens, NP   2 months ago Age-related osteoporosis without current pathological fracture   [facility] [place], Coralie Keens, NP   3 months ago Dark urine   [facility] [place], Coralie Keens, NP   10 months ago Encounter for general adult medical examination with abnormal findings   [facility] [place], Coralie Keens, NP       Future Appointments             In 3 months Baity, Coralie Keens, NP [facility], [facility]

## 2022-08-20 ENCOUNTER — Other Ambulatory Visit: Payer: Self-pay

## 2022-08-20 MED ORDER — LISINOPRIL 20 MG PO TABS: 20.0000 mg | ORAL_TABLET | Freq: Every day | ORAL | 1 refills | Status: DC

## 2022-08-26 ENCOUNTER — Ambulatory Visit: Payer: Medicare HMO | Admitting: Pulmonary Disease

## 2022-09-03 ENCOUNTER — Telehealth (INDEPENDENT_AMBULATORY_CARE_PROVIDER_SITE_OTHER): Payer: Medicare HMO | Admitting: Primary Care

## 2022-09-03 ENCOUNTER — Encounter: Payer: Self-pay | Admitting: Primary Care

## 2022-09-03 DIAGNOSIS — G4733 Obstructive sleep apnea (adult) (pediatric): Secondary | ICD-10-CM

## 2022-09-03 NOTE — Progress Notes (Signed)
Reviewed and agree with assessment/plan.   Coralyn Helling, MD [facility] Pulmonary/Critical Care 09/03/2022, 3:57 PM Pager:  [phone]

## 2022-09-03 NOTE — Patient Instructions (Addendum)
  I want you to decrease humidification level  Try turning ramp off Aim to wear BIPAP every night 4 hours or more   Order: Decrease Max IPAP to 19 and EPAP to 10  Follow-up: 3-4 weeks with Waynetta Sandy NP

## 2022-09-03 NOTE — Progress Notes (Signed)
Virtual Visit via Video Note  I connected with Kristie Byrd on 09/03/22 at  4:00 PM EDT by a video enabled telemedicine application and verified that I am speaking with the correct person using two identifiers.  Location: Patient: Home  Provider: Office    I discussed the limitations of evaluation and management by telemedicine and the availability of in person appointments. The patient expressed understanding and agreed to proceed.  History of Present Illness: 77 year old female, former smoker quit 1998.  Past medical history significant for hypertension, COPD, OSA, osteoporosis, chronic kidney disease, anxiety/depression, obesity.  Previous [facility] pulmonary encounter:  03/15/2022 Follow up ; OSA  Patient presents for 75-month follow-up.  Patient was found to have moderate obstructive sleep apnea.  She was tried on CPAP but unfortunate was unable to tolerate.  We set her up for a CPAP titration study.  Titration study done on March 02, 2022 showed patient required BiPAP .  We discussed her titration study in detail. She is okay to start BIPAP , wants to feel better and be less tired.  Patient continues to have ongoing restless sleep, snoring and daytime sleepiness.  Feels tired all the time   09/03/2022- interim hx  Patient contacted today for BIPAP compliance. The humidifier on her BIPAP is running out of water after 4 hours of use. This started happening as soon as she got her machine. She wears her CPAP on average 4 hours a night. Feels pressure is too strong in the beginning of the night. No issues with mask fit. She has no trouble wearing BIPAP while sleeping. She reports sleeping well, she wakes up at 3am to let puppy's out and make coffee.   Airview download 08/02/22-08/31/22 Usage 27/30 days (90%); 16 days (53%) > 4 hours Pressure Mac IPAP 20 (17.7cm h20-95%); Min EPAP 8 (13.8cmh20 -95%) Airleaks 9.5L/min (95%) AHI 5.1   Observations/Objective:  - Appears well without overt  respiratory symptoms  TEST/EVENTS :  home sleep study that was completed on August 02, 2021 that showed moderate sleep apnea with AHI 24.5/hour and SPO2 low at 75%.   03/02/22 Titration study >> needed BiPAP , sub optimal on 17/13  SUggest try auto BiPAP, EPAP min 8, PS +4, IPAP max 20 woth med airfit F20 mask  Assessment and Plan:  Moderate OSA: - Patient is running out of water in humidifier on her BIPAP. She also feels pressure is not strong enough when she first goes to bed. She has no issues wearing BIPAP when sleeping.  - Advised patient try decreasing humidity level on BIPAP and turn of RAMP time - Current pressure 20/8 with PS 4; Residual AHI 5.1   - Decrease Max IPAP 19cm h20 and increase Min EPAP 10 cm h20   COPD: - No acute respiratory issues  - Continue Breztri Aerosphere BID and prn Albuterol   Follow Up Instructions:   - 3-4 week virtual follow-up with Beth NP  I discussed the assessment and treatment plan with the patient. The patient was provided an opportunity to ask questions and all were answered. The patient agreed with the plan and demonstrated an understanding of the instructions.   The patient was advised to call back or seek an in-person evaluation if the symptoms worsen or if the condition fails to improve as anticipated.  I provided 22 minutes of non-face-to-face time during this encounter.   Glenford Bayley, NP

## 2022-09-07 ENCOUNTER — Telehealth: Payer: Self-pay | Admitting: Internal Medicine

## 2022-09-07 NOTE — Telephone Encounter (Signed)
Contacted Eufelia A Dandridge to schedule their annual wellness visit. Appointment made for 09/30/2022.  Verlee Rossetti; Care Guide Ambulatory Clinical Support [place] [facility] Direct Dial: [phone]

## 2022-09-30 ENCOUNTER — Ambulatory Visit (INDEPENDENT_AMBULATORY_CARE_PROVIDER_SITE_OTHER): Payer: Medicare HMO

## 2022-09-30 VITALS — BP 128/76 | Ht 65.0 in | Wt 239.4 lb

## 2022-09-30 DIAGNOSIS — Z0000 Encounter for general adult medical examination without abnormal findings: Secondary | ICD-10-CM

## 2022-09-30 NOTE — Patient Instructions (Signed)
Kristie Byrd , Thank you for taking time to come for your Medicare Wellness Visit. I appreciate your ongoing commitment to your health goals. Please review the following plan we discussed and let me know if I can assist you in the future.   These are the goals we discussed:  Goals      DIET - EAT MORE FRUITS AND VEGETABLES     Home and Family Safety Maintained     Evidence-based guidance:  Identify and review with patient potential safety risks from home or living environment, automobile driving and/or riding, as well as firearms.   Provide anticipatory guidance related to specific risks to safety; brainstorm acceptable strategies to reduce risk.  Identify resources needed to improve or maintain safety.  Provide emergency services contact information; encourage placement of contact information in easy to locate place at home and in wallet, purse or backpack.   Notes:      Increase physical activity     Starting 08/28/2018, I will continue to walk 2000 steps daily and increase to 7000 steps daily when shelter in place is over.         This is a list of the screening recommended for you and due dates:  Health Maintenance  Topic Date Due   Zoster (Shingles) Vaccine (2 of 2) 03/22/2017   Colon Cancer Screening  02/07/2020   COVID-19 Vaccine (3 - 2023-24 season) 01/15/2022   Flu Shot  12/16/2022   Medicare Annual Wellness Visit  09/30/2023   DTaP/Tdap/Td vaccine (3 - Td or Tdap) 02/19/2025   Pneumonia Vaccine  Completed   DEXA scan (bone density measurement)  Completed   Hepatitis C Screening: USPSTF Recommendation to screen - Ages 40-79 yo.  Completed   HPV Vaccine  Aged Out    Advanced directives: no  Conditions/risks identified: none  Next appointment: Follow up in one year for your annual wellness visit 10/06/23 @ 3:00 pm in person   Preventive Care 65 Years and Older, Female Preventive care refers to lifestyle choices and visits with your health care provider that can promote  health and wellness. What does preventive care include? A yearly physical exam. This is also called an annual well check. Dental exams once or twice a year. Routine eye exams. Ask your health care provider how often you should have your eyes checked. Personal lifestyle choices, including: Daily care of your teeth and gums. Regular physical activity. Eating a healthy diet. Avoiding tobacco and drug use. Limiting alcohol use. Practicing safe sex. Taking low-dose aspirin every day. Taking vitamin and mineral supplements as recommended by your health care provider. What happens during an annual well check? The services and screenings done by your health care provider during your annual well check will depend on your age, overall health, lifestyle risk factors, and family history of disease. Counseling  Your health care provider may ask you questions about your: Alcohol use. Tobacco use. Drug use. Emotional well-being. Home and relationship well-being. Sexual activity. Eating habits. History of falls. Memory and ability to understand (cognition). Work and work Astronomer. Reproductive health. Screening  You may have the following tests or measurements: Height, weight, and BMI. Blood pressure. Lipid and cholesterol levels. These may be checked every 5 years, or more frequently if you are over 76 years old. Skin check. Lung cancer screening. You may have this screening every year starting at age 46 if you have a 30-pack-year history of smoking and currently smoke or have quit within the past 15 years. Fecal  occult blood test (FOBT) of the stool. You may have this test every year starting at age 70. Flexible sigmoidoscopy or colonoscopy. You may have a sigmoidoscopy every 5 years or a colonoscopy every 10 years starting at age 27. Hepatitis C blood test. Hepatitis B blood test. Sexually transmitted disease (STD) testing. Diabetes screening. This is done by checking your blood sugar  (glucose) after you have not eaten for a while (fasting). You may have this done every 1-3 years. Bone density scan. This is done to screen for osteoporosis. You may have this done starting at age 7. Mammogram. This may be done every 1-2 years. Talk to your health care provider about how often you should have regular mammograms. Talk with your health care provider about your test results, treatment options, and if necessary, the need for more tests. Vaccines  Your health care provider may recommend certain vaccines, such as: Influenza vaccine. This is recommended every year. Tetanus, diphtheria, and acellular pertussis (Tdap, Td) vaccine. You may need a Td booster every 10 years. Zoster vaccine. You may need this after age 67. Pneumococcal 13-valent conjugate (PCV13) vaccine. One dose is recommended after age 9. Pneumococcal polysaccharide (PPSV23) vaccine. One dose is recommended after age 22. Talk to your health care provider about which screenings and vaccines you need and how often you need them. This information is not intended to replace advice given to you by your health care provider. Make sure you discuss any questions you have with your health care provider. Document Released: 05/30/2015 Document Revised: 01/21/2016 Document Reviewed: 03/04/2015 Elsevier Interactive Patient Education  2017 [employer].  Fall Prevention in the Home Falls can cause injuries. They can happen to people of all ages. There are many things you can do to make your home safe and to help prevent falls. What can I do on the outside of my home? Regularly fix the edges of walkways and driveways and fix any cracks. Remove anything that might make you trip as you walk through a door, such as a raised step or threshold. Trim any bushes or trees on the path to your home. Use bright outdoor lighting. Clear any walking paths of anything that might make someone trip, such as rocks or tools. Regularly check to see  if handrails are loose or broken. Make sure that both sides of any steps have handrails. Any raised decks and porches should have guardrails on the edges. Have any leaves, snow, or ice cleared regularly. Use sand or salt on walking paths during winter. Clean up any spills in your garage right away. This includes oil or grease spills. What can I do in the bathroom? Use night lights. Install grab bars by the toilet and in the tub and shower. Do not use towel bars as grab bars. Use non-skid mats or decals in the tub or shower. If you need to sit down in the shower, use a plastic, non-slip stool. Keep the floor dry. Clean up any water that spills on the floor as soon as it happens. Remove soap buildup in the tub or shower regularly. Attach bath mats securely with double-sided non-slip rug tape. Do not have throw rugs and other things on the floor that can make you trip. What can I do in the bedroom? Use night lights. Make sure that you have a light by your bed that is easy to reach. Do not use any sheets or blankets that are too big for your bed. They should not hang down onto the  floor. Have a firm chair that has side arms. You can use this for support while you get dressed. Do not have throw rugs and other things on the floor that can make you trip. What can I do in the kitchen? Clean up any spills right away. Avoid walking on wet floors. Keep items that you use a lot in easy-to-reach places. If you need to reach something above you, use a strong step stool that has a grab bar. Keep electrical cords out of the way. Do not use floor polish or wax that makes floors slippery. If you must use wax, use non-skid floor wax. Do not have throw rugs and other things on the floor that can make you trip. What can I do with my stairs? Do not leave any items on the stairs. Make sure that there are handrails on both sides of the stairs and use them. Fix handrails that are broken or loose. Make sure that  handrails are as long as the stairways. Check any carpeting to make sure that it is firmly attached to the stairs. Fix any carpet that is loose or worn. Avoid having throw rugs at the top or bottom of the stairs. If you do have throw rugs, attach them to the floor with carpet tape. Make sure that you have a light switch at the top of the stairs and the bottom of the stairs. If you do not have them, ask someone to add them for you. What else can I do to help prevent falls? Wear shoes that: Do not have high heels. Have rubber bottoms. Are comfortable and fit you well. Are closed at the toe. Do not wear sandals. If you use a stepladder: Make sure that it is fully opened. Do not climb a closed stepladder. Make sure that both sides of the stepladder are locked into place. Ask someone to hold it for you, if possible. Clearly mark and make sure that you can see: Any grab bars or handrails. First and last steps. Where the edge of each step is. Use tools that help you move around (mobility aids) if they are needed. These include: Canes. Walkers. Scooters. Crutches. Turn on the lights when you go into a dark area. Replace any light bulbs as soon as they burn out. Set up your furniture so you have a clear path. Avoid moving your furniture around. If any of your floors are uneven, fix them. If there are any pets around you, be aware of where they are. Review your medicines with your doctor. Some medicines can make you feel dizzy. This can increase your chance of falling. Ask your doctor what other things that you can do to help prevent falls. This information is not intended to replace advice given to you by your health care provider. Make sure you discuss any questions you have with your health care provider. Document Released: 02/27/2009 Document Revised: 10/09/2015 Document Reviewed: 06/07/2014 Elsevier Interactive Patient Education  2017 [employer].

## 2022-09-30 NOTE — Progress Notes (Signed)
Subjective:   Kristie Byrd is a 77 y.o. female who presents for Medicare Annual (Subsequent) preventive examination.  Review of Systems     Cardiac Risk Factors include: advanced age (>44men, >54 women);sedentary lifestyle;hypertension     Objective:    Today's Vitals   09/30/22 1453 09/30/22 1454  BP: 128/76   Weight: 239 lb 6.4 oz (108.6 kg)   Height: 5\' 5"  (1.651 m)   PainSc:  4    Body mass index is 39.84 kg/m.     09/30/2022    3:01 PM 06/02/2022   11:43 AM 04/19/2022    1:15 PM 10/05/2021    1:01 PM 04/02/2020    1:17 PM 10/03/2019    1:54 PM 04/03/2019    2:10 PM  Advanced Directives  Does Patient Have a Medical Advance Directive? No No No No  No No  Would patient like information on creating a medical advance directive? No - Patient declined No - Patient declined No - Patient declined No - Patient declined No - Patient declined No - Patient declined No - Patient declined    Current Medications (verified) Outpatient Encounter Medications as of 09/30/2022  Medication Sig   acetaminophen (TYLENOL) 650 MG CR tablet Take 1,000 mg by mouth every 4 (four) hours as needed.   albuterol (PROVENTIL) (2.5 MG/3ML) 0.083% nebulizer solution Take 3 mLs (2.5 mg total) by nebulization every 6 (six) hours as needed for wheezing or shortness of breath.   Budeson-Glycopyrrol-Formoterol (BREZTRI AEROSPHERE) 160-9-4.8 MCG/ACT AERO INHALE 2 PUFFS TWICE DAILY   buPROPion (WELLBUTRIN XL) 300 MG 24 hr tablet TAKE 1 TABLET EVERY DAY   calcitRIOL (ROCALTROL) 0.25 MCG capsule Take 0.25 mcg by mouth daily.   Cholecalciferol (VITAMIN D3) 1000 units CAPS Take 1,000 Units by mouth daily.   ferrous sulfate 325 (65 FE) MG EC tablet Take 325 mg by mouth 3 (three) times daily with meals.   lisinopril (ZESTRIL) 20 MG tablet Take 1 tablet (20 mg total) by mouth daily.   Multiple Vitamin (MULTIVITAMIN) tablet Take 1 tablet by mouth at bedtime.    propranolol (INDERAL) 40 MG tablet TAKE 1 TABLET TWICE  DAILY   sertraline (ZOLOFT) 100 MG tablet TAKE 1 TABLET EVERY DAY   fluticasone (FLONASE) 50 MCG/ACT nasal spray Place 1 spray into both nostrils as needed for allergies or rhinitis. (Patient not taking: Reported on 09/30/2022)   No facility-administered encounter medications on file as of 09/30/2022.    Allergies (verified) Codeine, Demerol [meperidine], Fleet phospho-soda [sodium phosphates], Latex, Other, and Prednisone   History: Past Medical History:  Diagnosis Date   Arthritis    CKD (chronic kidney disease), stage III (HCC)    Colon polyps    COPD (chronic obstructive pulmonary disease) (HCC)    Hypertension    Iron deficiency anemia 09/27/2012   PONV (postoperative nausea and vomiting)    Shingles    Sleep apnea    starting CPAP in 2024   Uses walker    or cane   Wears dentures    full upper and lower   Wears hearing aid in right ear    Past Surgical History:  Procedure Laterality Date   BREAST BIOPSY Bilateral    neg   CARPAL TUNNEL RELEASE Bilateral    CATARACT EXTRACTION W/PHACO Right 05/19/2022   Procedure: CATARACT EXTRACTION PHACO AND INTRAOCULAR LENS PLACEMENT (IOC) RIGHT  5.64  00:50.6;  Surgeon: Lockie Mola, MD;  Location: [facility];  Service: Ophthalmology;  Laterality: Right;  Sleep apnea   CATARACT EXTRACTION W/PHACO Left 06/02/2022   Procedure: CATARACT EXTRACTION PHACO AND INTRAOCULAR LENS PLACEMENT (IOC) LEFT 4.93 52.5;  Surgeon: Lockie Mola, MD;  Location: [facility];  Service: Ophthalmology;  Laterality: Left;  sleep apnea   COLONOSCOPY  multiple   KNEE ARTHROPLASTY Right 05/12/2016   Procedure: COMPUTER ASSISTED TOTAL KNEE ARTHROPLASTY;  Surgeon: Donato Heinz, MD;  Location: [facility];  Service: Orthopedics;  Laterality: Right;   KNEE SURGERY Right    NOSE SURGERY     TONSILLECTOMY     Family History  Problem Relation Age of Onset   Colon cancer Daughter    Social History   Socioeconomic History   Marital  status: Divorced    Spouse name: Not on file   Number of children: 2   Years of education: Not on file   Highest education level: Not on file  Occupational History   Occupation: retired  Tobacco Use   Smoking status: Former    Packs/day: 1.00    Years: 37.00    Additional pack years: 0.00    Total pack years: 37.00    Types: Cigarettes    Quit date: 11/15/1996    Years since quitting: 25.8   Smokeless tobacco: Never  Vaping Use   Vaping Use: Never used  Substance and Sexual Activity   Alcohol use: No    Alcohol/week: 0.0 standard drinks of alcohol   Drug use: No    Comment: uses CBD oil    Sexual activity: Never  Other Topics Concern   Not on file  Social History Narrative   Lives alone.  Divorced. Two daughters- in 78s, 3 grandchildren.   Does not know family history- raised in [facility].        Desires CPR.  Does not have HPOA.   She would possibly want life support if for short period of time . Unsure about feeding tube.   Social Determinants of Health   Financial Resource Strain: Low Risk  (09/30/2022)   Overall Financial Resource Strain (CARDIA)    Difficulty of Paying Living Expenses: Not hard at all  Food Insecurity: No Food Insecurity (09/30/2022)   Hunger Vital Sign    Worried About Running Out of Food in the Last Year: Never true    Ran Out of Food in the Last Year: Never true  Transportation Needs: No Transportation Needs (09/30/2022)   PRAPARE - Administrator, Civil Service (Medical): No    Lack of Transportation (Non-Medical): No  Physical Activity: Inactive (09/30/2022)   Exercise Vital Sign    Days of Exercise per Week: 0 days    Minutes of Exercise per Session: 0 min  Stress: No Stress Concern Present (09/30/2022)   [employer] of Occupational Health - Occupational Stress Questionnaire    Feeling of Stress : Not at all  Social Connections: Socially Isolated (09/30/2022)   Social Connection and Isolation Panel [NHANES]     Frequency of Communication with Friends and Family: More than three times a week    Frequency of Social Gatherings with Friends and Family: Twice a week    Attends Religious Services: Never    Database administrator or Organizations: No    Attends Engineer, structural: Never    Marital Status: Divorced    Tobacco Counseling Counseling given: Not Answered   Clinical Intake:  Pre-visit preparation completed: Yes  Pain : 0-10 Pain Score: 4  Pain Type: Chronic pain  Pain Location: Back     Nutritional Risks: None Diabetes: No  How often do you need to have someone help you when you read instructions, pamphlets, or other written materials from your doctor or pharmacy?: 1 - Never  Diabetic?no  Interpreter Needed?: No  Information entered by :: Kennedy Bucker, LPN   Activities of Daily Living    09/30/2022    3:03 PM 08/09/2022    2:59 PM  In your present state of health, do you have any difficulty performing the following activities:  Hearing? 1 1  Vision? 0 0  Difficulty concentrating or making decisions? 0 0  Walking or climbing stairs? 1 1  Dressing or bathing? 0 0  Doing errands, shopping? 0 0  Preparing Food and eating ? N   Using the Toilet? N   In the past six months, have you accidently leaked urine? N   Do you have problems with loss of bowel control? N   Managing your Medications? N   Managing your Finances? N   Housekeeping or managing your Housekeeping? N     Patient Care Team: Lorre Munroe, NP as PCP - General (Internal Medicine) Lupita Leash, MD as Consulting Physician (Pulmonary Disease) Johney Maine, MD as Consulting Physician (Oncology) Iva Boop, MD as Consulting Physician (Gastroenterology) Lockie Mola, MD as Referring Physician (Ophthalmology) Earna Coder, MD as Consulting Physician (Oncology)  Indicate any recent Medical Services you may have received from other than [facility] providers in the past year  (date may be approximate).     Assessment:   This is a routine wellness examination for Milliani.  Hearing/Vision screen Hearing Screening - Comments:: Wears aid in one ear (right) Vision Screening - Comments:: Wears glasses- Dr.Brasington  Dietary issues and exercise activities discussed: Current Exercise Habits: The patient does not participate in regular exercise at present   Goals Addressed             This Visit's Progress    DIET - EAT MORE FRUITS AND VEGETABLES         Depression Screen    09/30/2022    2:58 PM 08/09/2022    2:58 PM 07/26/2022   10:37 AM 05/07/2022   11:20 AM 09/16/2020    2:13 PM 03/31/2020   12:21 PM 08/28/2018    2:20 PM  PHQ 2/9 Scores  PHQ - 2 Score 0 0 0 0 6 0 0  PHQ- 9 Score 0    21 3 0    Fall Risk    09/30/2022    3:02 PM 08/09/2022    2:59 PM 07/26/2022   10:37 AM 05/07/2022   11:20 AM 09/16/2020    2:14 PM  Fall Risk   Falls in the past year? 1 1 0 1 1  Number falls in past yr: 1 1  1 1   Injury with Fall? 0 0 0 0 0  Risk for fall due to : Orthopedic patient;History of fall(s) Impaired balance/gait;Impaired mobility No Fall Risks  Impaired mobility  Follow up Falls evaluation completed;Falls prevention discussed    Falls evaluation completed    FALL RISK PREVENTION PERTAINING TO THE HOME:  Any stairs in or around the home? Yes  If so, are there any without handrails? No  Home free of loose throw rugs in walkways, pet beds, electrical cords, etc? Yes  Adequate lighting in your home to reduce risk of falls? Yes   ASSISTIVE DEVICES UTILIZED TO PREVENT FALLS:  Life alert? No  Use of a cane, walker or w/c? Yes - walker Grab bars in the bathroom? No  Shower chair or bench in shower? No  Elevated toilet seat or a handicapped toilet? Yes   TIMED UP AND GO:  Was the test performed? Yes .  Length of time to ambulate 10 feet: 4 sec.   Gait steady and fast with assistive device  Cognitive Function:    09/19/2020    1:04 PM  08/28/2018    2:21 PM 08/22/2017    2:40 PM 07/05/2016    8:11 AM  MMSE - Mini Mental State Exam  Orientation to time 5 5 5 5   Orientation to Place 5 5 5 5   Registration 3 3 3 3   Attention/ Calculation 5 0 0 0  Recall 3 3 3 3   Language- name 2 objects 2 0 0 0  Language- repeat 1 1 1 1   Language- follow 3 step command 3 0 3 3  Language- read & follow direction 1 0 0 0  Write a sentence 1 0 0 0  Copy design 0 0 0 0  Total score 29 17 20 20         09/30/2022    3:14 PM 09/24/2021    3:09 PM  6CIT Screen  What Year? 0 points 0 points  What month? 0 points 0 points  What time? 0 points 0 points  Count back from 20 0 points 0 points  Months in reverse 2 points 0 points  Repeat phrase 0 points 0 points  Total Score 2 points 0 points    Immunizations Immunization History  Administered Date(s) Administered   Fluad Quad(high Dose 65+) 03/21/2019, 03/31/2020, 02/03/2022   Influenza Split 02/15/2013   Influenza, High Dose Seasonal PF 02/02/2014, 01/21/2015, 01/25/2017, 07/15/2018   Influenza, Seasonal, Injecte, Preservative Fre 01/02/2016   Influenza-Unspecified 01/25/2017, 04/17/2021   PFIZER(Purple Top)SARS-COV-2 Vaccination 06/29/2019, 07/20/2019   Pneumococcal Conjugate-13 02/02/2014   Pneumococcal Polysaccharide-23 01/25/2017   Pneumococcal-Unspecified 02/19/2013   Td 11/05/2013   Tdap 02/20/2015   Zoster Recombinat (Shingrix) 01/25/2017   Zoster, Live 12/27/2012    TDAP status: Up to date  Flu Vaccine status: Up to date  Pneumococcal vaccine status: Up to date  Covid-19 vaccine status: Completed vaccines  Qualifies for Shingles Vaccine? Yes   Zostavax completed Yes   Shingrix Completed?: No.    Education has been provided regarding the importance of this vaccine. Patient has been advised to call insurance company to determine out of pocket expense if they have not yet received this vaccine. Advised may also receive vaccine at local pharmacy or Health Dept. Verbalized  acceptance and understanding.  Screening Tests Health Maintenance  Topic Date Due   Zoster Vaccines- Shingrix (2 of 2) 03/22/2017   COLONOSCOPY (Pts 45-31yrs Insurance coverage will need to be confirmed)  02/07/2020   COVID-19 Vaccine (3 - 2023-24 season) 01/15/2022   INFLUENZA VACCINE  12/16/2022   Medicare Annual Wellness (AWV)  09/30/2023   DTaP/Tdap/Td (3 - Td or Tdap) 02/19/2025   Pneumonia Vaccine 36+ Years old  Completed   DEXA SCAN  Completed   Hepatitis C Screening  Completed   HPV VACCINES  Aged Out    Health Maintenance  Health Maintenance Due  Topic Date Due   Zoster Vaccines- Shingrix (2 of 2) 03/22/2017   COLONOSCOPY (Pts 45-21yrs Insurance coverage will need to be confirmed)  02/07/2020   COVID-19 Vaccine (3 - 2023-24 season) 01/15/2022  Had 1st Shingrix   Colorectal cancer screening:  No longer required.   Mammogram status: No longer required due to age.  Bone Density status: Completed 09/30/20. Results reflect: Bone density results: OSTEOPOROSIS. Repeat every 2 years.  Lung Cancer Screening: (Low Dose CT Chest recommended if Age 45-80 years, 30 pack-year currently smoking OR have quit w/in 15years.) does not qualify.    Additional Screening:  Hepatitis C Screening: does qualify; Completed 05/20/15  Vision Screening: Recommended annual ophthalmology exams for early detection of glaucoma and other disorders of the eye. Is the patient up to date with their annual eye exam?  Yes  Who is the provider or what is the name of the office in which the patient attends annual eye exams? Dr.Brasington If pt is not established with a provider, would they like to be referred to a provider to establish care? No .   Dental Screening: Recommended annual dental exams for proper oral hygiene  Community Resource Referral / Chronic Care Management: CRR required this visit?  No   CCM required this visit?  No      Plan:     I have personally reviewed and noted the  following in the patient's chart:   Medical and social history Use of alcohol, tobacco or illicit drugs  Current medications and supplements including opioid prescriptions. Patient is not currently taking opioid prescriptions. Functional ability and status Nutritional status Physical activity Advanced directives List of other physicians Hospitalizations, surgeries, and ER visits in previous 12 months Vitals Screenings to include cognitive, depression, and falls Referrals and appointments  In addition, I have reviewed and discussed with patient certain preventive protocols, quality metrics, and best practice recommendations. A written personalized care plan for preventive services as well as general preventive health recommendations were provided to patient.     Hal Hope, LPN   10/19/5407   Nurse Notes: none

## 2022-10-17 ENCOUNTER — Other Ambulatory Visit: Payer: Self-pay | Admitting: Internal Medicine

## 2022-10-18 ENCOUNTER — Other Ambulatory Visit: Payer: Self-pay | Admitting: *Deleted

## 2022-10-18 DIAGNOSIS — D631 Anemia in chronic kidney disease: Secondary | ICD-10-CM

## 2022-10-18 DIAGNOSIS — N183 Chronic kidney disease, stage 3 unspecified: Secondary | ICD-10-CM

## 2022-10-18 MED FILL — Iron Sucrose Inj 20 MG/ML (Fe Equiv): INTRAVENOUS | Qty: 10 | Status: AC

## 2022-10-18 NOTE — Telephone Encounter (Signed)
Requested medication (s) are due for refill today: routing for review  Requested medication (s) are on the active medication list: yes  Last refill:  08/05/22  Future visit scheduled: yes  Notes to clinic:   Medication not assigned to a protocol, review manually.      Requested Prescriptions  Pending Prescriptions Disp Refills   BREZTRI AEROSPHERE 160-9-4.8 MCG/ACT AERO [Pharmacy Med Name: BREZTRI AEROSPHERE 160-9-4.8 MCG/ACT Aerosol] 3 each 3    Sig: INHALE 2 PUFFS TWICE DAILY     Off-Protocol Failed - 10/17/2022  3:28 AM      Failed - Medication not assigned to a protocol, review manually.      Passed - Valid encounter within last 12 months    Recent Outpatient Visits           2 months ago Primary hypertension   [place] [facility] [place], Salvadore Oxford, NP   2 months ago Primary hypertension   [facility] [place], Salvadore Oxford, NP   4 months ago Age-related osteoporosis without current pathological fracture   [place] [facility] [place], Salvadore Oxford, NP   5 months ago Dark urine   [place] [facility] [place], Salvadore Oxford, NP   1 year ago Encounter for general adult medical examination with abnormal findings   [place] [facility] [place], Salvadore Oxford, NP       Future Appointments             In 1 month [place], Salvadore Oxford, NP  [facility], [facility]

## 2022-10-19 ENCOUNTER — Inpatient Hospital Stay: Payer: Medicare HMO

## 2022-10-19 ENCOUNTER — Inpatient Hospital Stay: Payer: Medicare HMO | Admitting: Internal Medicine

## 2022-10-19 NOTE — Progress Notes (Deleted)
[facility] CONSULT NOTE  Patient Care Team: Lorre Munroe, NP as PCP - General (Internal Medicine) Lupita Leash, MD as Consulting Physician (Pulmonary Disease) Johney Maine, MD as Consulting Physician (Oncology) Iva Boop, MD as Consulting Physician (Gastroenterology) Lockie Mola, MD as Referring Physician (Ophthalmology) Earna Coder, MD as Consulting Physician (Oncology)  CHIEF COMPLAINTS/PURPOSE OF CONSULTATION: Anemia  # ANEMIA- sec to IDA/CKD [Dr.Lateef-hemoglobin 11.5/iron saturation 13% ferritin 30; intolerant to p.o. iron.]# colonoscopy-~ 2016; [not until 2024]; STOPPED Aurexa [JAN 2023]  # CKD stage III  # Hx of smoking quit  Oncology History   No history exists.     HISTORY OF PRESENTING ILLNESS: Walks with a rolling walker because of back pain.  Alone. Kristie Byrd 77 y.o.  female longstanding history of chronic kidney disease stage III/ongoing anemia is here for a follow up.  Patient has no concerns. Patient compliant with her PO iron.   No nausea no vomiting. No blood in stools or black or stools.   Review of Systems  Constitutional:  Positive for malaise/fatigue. Negative for chills, diaphoresis, fever and weight loss.  HENT:  Negative for nosebleeds and sore throat.   Eyes:  Negative for double vision.  Respiratory:  Negative for cough, hemoptysis, sputum production, shortness of breath and wheezing.   Cardiovascular:  Negative for chest pain, palpitations, orthopnea and leg swelling.  Gastrointestinal:  Positive for constipation. Negative for abdominal pain, blood in stool, diarrhea, heartburn, melena, nausea and vomiting.  Genitourinary:  Negative for dysuria, frequency and urgency.  Musculoskeletal:  Negative for back pain and joint pain.  Skin: Negative.  Negative for itching and rash.  Neurological:  Negative for dizziness, tingling, focal weakness, weakness and headaches.  Endo/Heme/Allergies:  Does not  bruise/bleed easily.  Psychiatric/Behavioral:  Negative for depression. The patient is not nervous/anxious and does not have insomnia.      MEDICAL HISTORY:  Past Medical History:  Diagnosis Date  . Arthritis   . CKD (chronic kidney disease), stage III (HCC)   . Colon polyps   . COPD (chronic obstructive pulmonary disease) (HCC)   . Hypertension   . Iron deficiency anemia 09/27/2012  . PONV (postoperative nausea and vomiting)   . Shingles   . Sleep apnea    starting CPAP in 2024  . Uses walker    or cane  . Wears dentures    full upper and lower  . Wears hearing aid in right ear     SURGICAL HISTORY: Past Surgical History:  Procedure Laterality Date  . BREAST BIOPSY Bilateral    neg  . CARPAL TUNNEL RELEASE Bilateral   . CATARACT EXTRACTION W/PHACO Right 05/19/2022   Procedure: CATARACT EXTRACTION PHACO AND INTRAOCULAR LENS PLACEMENT (IOC) RIGHT  5.64  00:50.6;  Surgeon: Lockie Mola, MD;  Location: [facility];  Service: Ophthalmology;  Laterality: Right;  Sleep apnea  . CATARACT EXTRACTION W/PHACO Left 06/02/2022   Procedure: CATARACT EXTRACTION PHACO AND INTRAOCULAR LENS PLACEMENT (IOC) LEFT 4.93 52.5;  Surgeon: Lockie Mola, MD;  Location: [facility];  Service: Ophthalmology;  Laterality: Left;  sleep apnea  . COLONOSCOPY  multiple  . KNEE ARTHROPLASTY Right 05/12/2016   Procedure: COMPUTER ASSISTED TOTAL KNEE ARTHROPLASTY;  Surgeon: Donato Heinz, MD;  Location: [facility];  Service: Orthopedics;  Laterality: Right;  . KNEE SURGERY Right   . NOSE SURGERY    . TONSILLECTOMY      SOCIAL HISTORY: Social History   Socioeconomic History  .  Marital status: Divorced    Spouse name: Not on file  . Number of children: 2  . Years of education: Not on file  . Highest education level: Not on file  Occupational History  . Occupation: retired  Tobacco Use  . Smoking status: Former    Packs/day: 1.00    Years: 37.00    Additional pack years:  0.00    Total pack years: 37.00    Types: Cigarettes    Quit date: 11/15/1996    Years since quitting: 25.9  . Smokeless tobacco: Never  Vaping Use  . Vaping Use: Never used  Substance and Sexual Activity  . Alcohol use: No    Alcohol/week: 0.0 standard drinks of alcohol  . Drug use: No    Comment: uses CBD oil   . Sexual activity: Never  Other Topics Concern  . Not on file  Social History Narrative   Lives alone.  Divorced. Two daughters- in 14s, 3 grandchildren.   Does not know family history- raised in [facility] [place].        Desires CPR.  Does not have HPOA.   She would possibly want life support if for short period of time . Unsure about feeding tube.   Social Determinants of Health   Financial Resource Strain: Low Risk  (09/30/2022)   Overall Financial Resource Strain (CARDIA)   . Difficulty of Paying Living Expenses: Not hard at all  Food Insecurity: No Food Insecurity (09/30/2022)   Hunger Vital Sign   . Worried About Programme researcher, broadcasting/film/video in the Last Year: Never true   . Ran Out of Food in the Last Year: Never true  Transportation Needs: No Transportation Needs (09/30/2022)   PRAPARE - Transportation   . Lack of Transportation (Medical): No   . Lack of Transportation (Non-Medical): No  Physical Activity: Inactive (09/30/2022)   Exercise Vital Sign   . Days of Exercise per Week: 0 days   . Minutes of Exercise per Session: 0 min  Stress: No Stress Concern Present (09/30/2022)   [employer] of Occupational Health - Occupational Stress Questionnaire   . Feeling of Stress : Not at all  Social Connections: Socially Isolated (09/30/2022)   Social Connection and Isolation Panel [NHANES]   . Frequency of Communication with Friends and Family: More than three times a week   . Frequency of Social Gatherings with Friends and Family: Twice a week   . Attends Religious Services: Never   . Active Member of Clubs or Organizations: No   . Attends Banker  Meetings: Never   . Marital Status: Divorced  Catering manager Violence: Not At Risk (09/30/2022)   Humiliation, Afraid, Rape, and Kick questionnaire   . Fear of Current or Ex-Partner: No   . Emotionally Abused: No   . Physically Abused: No   . Sexually Abused: No    FAMILY HISTORY: Family History  Problem Relation Age of Onset  . Colon cancer Daughter     ALLERGIES:  is allergic to codeine, demerol [meperidine], fleet phospho-soda [sodium phosphates], latex, other, and prednisone.  MEDICATIONS:  Current Outpatient Medications  Medication Sig Dispense Refill  . acetaminophen (TYLENOL) 650 MG CR tablet Take 1,000 mg by mouth every 4 (four) hours as needed.    Marland Kitchen albuterol (PROVENTIL) (2.5 MG/3ML) 0.083% nebulizer solution Take 3 mLs (2.5 mg total) by nebulization every 6 (six) hours as needed for wheezing or shortness of breath. 150 mL 1  . Budeson-Glycopyrrol-Formoterol (BREZTRI AEROSPHERE)  160-9-4.8 MCG/ACT AERO INHALE 2 PUFFS TWICE DAILY 3 each 0  . buPROPion (WELLBUTRIN XL) 300 MG 24 hr tablet TAKE 1 TABLET EVERY DAY 90 tablet 0  . calcitRIOL (ROCALTROL) 0.25 MCG capsule Take 0.25 mcg by mouth daily.    . Cholecalciferol (VITAMIN D3) 1000 units CAPS Take 1,000 Units by mouth daily.    . ferrous sulfate 325 (65 FE) MG EC tablet Take 325 mg by mouth 3 (three) times daily with meals.    . fluticasone (FLONASE) 50 MCG/ACT nasal spray Place 1 spray into both nostrils as needed for allergies or rhinitis. (Patient not taking: Reported on 09/30/2022)    . lisinopril (ZESTRIL) 20 MG tablet Take 1 tablet (20 mg total) by mouth daily. 90 tablet 1  . Multiple Vitamin (MULTIVITAMIN) tablet Take 1 tablet by mouth at bedtime.     . propranolol (INDERAL) 40 MG tablet TAKE 1 TABLET TWICE DAILY 180 tablet 0  . sertraline (ZOLOFT) 100 MG tablet TAKE 1 TABLET EVERY DAY 90 tablet 0   No current facility-administered medications for this visit.      Marland Kitchen  PHYSICAL EXAMINATION: ECOG PERFORMANCE  STATUS: 1 - Symptomatic but completely ambulatory  There were no vitals filed for this visit.  There were no vitals filed for this visit.   Physical Exam HENT:     Head: Normocephalic and atraumatic.     Mouth/Throat:     Pharynx: No oropharyngeal exudate.  Eyes:     Pupils: Pupils are equal, round, and reactive to light.  Cardiovascular:     Rate and Rhythm: Normal rate and regular rhythm.  Pulmonary:     Effort: No respiratory distress.     Breath sounds: No wheezing.  Abdominal:     General: Bowel sounds are normal. There is no distension.     Palpations: Abdomen is soft. There is no mass.     Tenderness: There is no abdominal tenderness. There is no guarding or rebound.  Musculoskeletal:        General: No tenderness. Normal range of motion.     Cervical back: Normal range of motion and neck supple.  Skin:    General: Skin is warm.  Neurological:     Mental Status: She is alert and oriented to person, place, and time.  Psychiatric:        Mood and Affect: Affect normal.     LABORATORY DATA:  I have reviewed the data as listed Lab Results  Component Value Date   WBC 5.7 04/19/2022   HGB 12.2 04/19/2022   HCT 38.1 04/19/2022   MCV 93.6 04/19/2022   PLT 252 04/19/2022   Recent Labs    04/19/22 1255  NA 138  K 4.7  CL 100  CO2 30  GLUCOSE 112*  BUN 25*  CREATININE 1.38*  CALCIUM 9.3  GFRNONAA 40*     RADIOGRAPHIC STUDIES: I have personally reviewed the radiological images as listed and agreed with the findings in the report. No results found.  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  Iron tablets: not on PO iron sec to lack of sample s[Aurxya from neprhology]. Continue gentle iron 1 pill a day;- Hb 12.2; HOLD venofer.   # CKD- stage III [Dr.Lateef-X]-GFR 40-   STABLE.   # DISPOSITION: #  HOLD venofer # follow up in  6 Months- labs/cbc/bmpiron studies/fertitin-/possible venofer-Dr.B  All questions were answered. The patient knows to call the clinic with any problems, questions or concerns.    Earna Coder, MD 10/19/2022 8:51 AM

## 2022-10-19 NOTE — Assessment & Plan Note (Deleted)
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  Iron tablets: not on PO iron sec to lack of sample s[Aurxya from neprhology]. Continue gentle iron 1 pill a day;- Hb 12.2; HOLD venofer.   # CKD- stage III [Dr.Lateef-X]-GFR 40-   STABLE.   # DISPOSITION: #  HOLD venofer # follow up in 6 Months- labs/cbc/bmpiron studies/fertitin-/possible venofer-Dr.B  

## 2022-10-21 MED FILL — Iron Sucrose Inj 20 MG/ML (Fe Equiv): INTRAVENOUS | Qty: 10 | Status: AC

## 2022-10-22 ENCOUNTER — Inpatient Hospital Stay: Payer: Medicare HMO | Attending: Internal Medicine | Admitting: Internal Medicine

## 2022-10-22 ENCOUNTER — Encounter: Payer: Self-pay | Admitting: Internal Medicine

## 2022-10-22 ENCOUNTER — Inpatient Hospital Stay: Payer: Medicare HMO

## 2022-10-22 VITALS — BP 130/57 | HR 71 | Resp 18 | Ht 65.0 in | Wt 238.0 lb

## 2022-10-22 DIAGNOSIS — Z79899 Other long term (current) drug therapy: Secondary | ICD-10-CM | POA: Insufficient documentation

## 2022-10-22 DIAGNOSIS — Z87891 Personal history of nicotine dependence: Secondary | ICD-10-CM | POA: Insufficient documentation

## 2022-10-22 DIAGNOSIS — D631 Anemia in chronic kidney disease: Secondary | ICD-10-CM

## 2022-10-22 DIAGNOSIS — N183 Chronic kidney disease, stage 3 unspecified: Secondary | ICD-10-CM | POA: Diagnosis not present

## 2022-10-22 DIAGNOSIS — Z800 Family history of malignant neoplasm of digestive organs: Secondary | ICD-10-CM | POA: Diagnosis not present

## 2022-10-22 LAB — BASIC METABOLIC PANEL
Anion gap: 11 (ref 5–15)
BUN: 26 mg/dL — ABNORMAL HIGH (ref 8–23)
CO2: 26 mmol/L (ref 22–32)
Calcium: 9.4 mg/dL (ref 8.9–10.3)
Chloride: 100 mmol/L (ref 98–111)
Creatinine, Ser: 1.19 mg/dL — ABNORMAL HIGH (ref 0.44–1.00)
GFR, Estimated: 47 mL/min — ABNORMAL LOW (ref >60)
Glucose, Bld: 109 mg/dL — ABNORMAL HIGH (ref 70–99)
Potassium: 4.7 mmol/L (ref 3.5–5.1)
Sodium: 137 mmol/L (ref 135–145)

## 2022-10-22 LAB — CBC WITH DIFFERENTIAL/PLATELET
Abs Immature Granulocytes: 0.02 10*3/uL (ref 0.00–0.07)
Basophils Absolute: 0 10*3/uL (ref 0.0–0.1)
Basophils Relative: 1 %
Eosinophils Absolute: 0.1 10*3/uL (ref 0.0–0.5)
Eosinophils Relative: 2 %
HCT: 36.5 % (ref 36.0–46.0)
Hemoglobin: 11.7 g/dL — ABNORMAL LOW (ref 12.0–15.0)
Immature Granulocytes: 0 %
Lymphocytes Relative: 18 %
Lymphs Abs: 1 10*3/uL (ref 0.7–4.0)
MCH: 30.1 pg (ref 26.0–34.0)
MCHC: 32.1 g/dL (ref 30.0–36.0)
MCV: 93.8 fL (ref 80.0–100.0)
Monocytes Absolute: 0.7 10*3/uL (ref 0.1–1.0)
Monocytes Relative: 12 %
Neutro Abs: 3.9 10*3/uL (ref 1.7–7.7)
Neutrophils Relative %: 67 %
Platelets: 251 10*3/uL (ref 150–400)
RBC: 3.89 MIL/uL (ref 3.87–5.11)
RDW: 13.4 % (ref 11.5–15.5)
WBC: 5.8 10*3/uL (ref 4.0–10.5)
nRBC: 0 % (ref 0.0–0.2)

## 2022-10-22 LAB — IRON AND TIBC
Iron: 96 ug/dL (ref 28–170)
Saturation Ratios: 23 % (ref 10.4–31.8)
TIBC: 426 ug/dL (ref 250–450)
UIBC: 330 ug/dL

## 2022-10-22 LAB — FERRITIN: Ferritin: 19 ng/mL (ref 11–307)

## 2022-10-22 NOTE — Progress Notes (Signed)
Patient states that she is tired a lot, and not sleeping well.

## 2022-10-22 NOTE — Progress Notes (Signed)
[facility] CONSULT NOTE  Patient Care Team: Lorre Munroe, NP as PCP - General (Internal Medicine) Lupita Leash, MD as Consulting Physician (Pulmonary Disease) Johney Maine, MD as Consulting Physician (Oncology) Iva Boop, MD as Consulting Physician (Gastroenterology) Lockie Mola, MD as Referring Physician (Ophthalmology) Earna Coder, MD as Consulting Physician (Oncology)  CHIEF COMPLAINTS/PURPOSE OF CONSULTATION: Anemia  # ANEMIA- sec to IDA/CKD [Dr.Lateef-hemoglobin 11.5/iron saturation 13% ferritin 30; intolerant to p.o. iron.]# colonoscopy-~ 2016; [not until 2024]; STOPPED Aurexa [JAN 2023]  # CKD stage III  # Hx of smoking quit  Oncology History   No history exists.   HISTORY OF PRESENTING ILLNESS: Walks with a rolling walker because of back pain.  Alone.  Kristie Byrd 77 y.o.  female longstanding history of chronic kidney disease stage III/ongoing anemia is here for a follow up.  Patient complains of ongoing fatigue.  Otherwise patient has no concerns. Patient compliant with her PO iron- mild constipation with Iron sulfate.   No nausea no vomiting. No blood in stools or black or stools.  Review of Systems  Constitutional:  Positive for malaise/fatigue. Negative for chills, diaphoresis, fever and weight loss.  HENT:  Negative for nosebleeds and sore throat.   Eyes:  Negative for double vision.  Respiratory:  Negative for cough, hemoptysis, sputum production, shortness of breath and wheezing.   Cardiovascular:  Negative for chest pain, palpitations, orthopnea and leg swelling.  Gastrointestinal:  Positive for constipation. Negative for abdominal pain, blood in stool, diarrhea, heartburn, melena, nausea and vomiting.  Genitourinary:  Negative for dysuria, frequency and urgency.  Musculoskeletal:  Negative for back pain and joint pain.  Skin: Negative.  Negative for itching and rash.  Neurological:  Negative for dizziness,  tingling, focal weakness, weakness and headaches.  Endo/Heme/Allergies:  Does not bruise/bleed easily.  Psychiatric/Behavioral:  Negative for depression. The patient is not nervous/anxious and does not have insomnia.      MEDICAL HISTORY:  Past Medical History:  Diagnosis Date   Arthritis    CKD (chronic kidney disease), stage III (HCC)    Colon polyps    COPD (chronic obstructive pulmonary disease) (HCC)    Hypertension    Iron deficiency anemia 09/27/2012   PONV (postoperative nausea and vomiting)    Shingles    Sleep apnea    starting CPAP in 2024   Uses walker    or cane   Wears dentures    full upper and lower   Wears hearing aid in right ear     SURGICAL HISTORY: Past Surgical History:  Procedure Laterality Date   BREAST BIOPSY Bilateral    neg   CARPAL TUNNEL RELEASE Bilateral    CATARACT EXTRACTION W/PHACO Right 05/19/2022   Procedure: CATARACT EXTRACTION PHACO AND INTRAOCULAR LENS PLACEMENT (IOC) RIGHT  5.64  00:50.6;  Surgeon: Lockie Mola, MD;  Location: [facility];  Service: Ophthalmology;  Laterality: Right;  Sleep apnea   CATARACT EXTRACTION W/PHACO Left 06/02/2022   Procedure: CATARACT EXTRACTION PHACO AND INTRAOCULAR LENS PLACEMENT (IOC) LEFT 4.93 52.5;  Surgeon: Lockie Mola, MD;  Location: [facility];  Service: Ophthalmology;  Laterality: Left;  sleep apnea   COLONOSCOPY  multiple   KNEE ARTHROPLASTY Right 05/12/2016   Procedure: COMPUTER ASSISTED TOTAL KNEE ARTHROPLASTY;  Surgeon: Donato Heinz, MD;  Location: [facility] OR[facility];  Service: Orthopedics;  Laterality: Right;   KNEE SURGERY Right    NOSE SURGERY     TONSILLECTOMY  SOCIAL HISTORY: Social History   Socioeconomic History   Marital status: Divorced    Spouse name: Not on file   Number of children: 2   Years of education: Not on file   Highest education level: Not on file  Occupational History   Occupation: retired  Tobacco Use   Smoking status: Former     Packs/day: 1.00    Years: 37.00    Additional pack years: 0.00    Total pack years: 37.00    Types: Cigarettes    Quit date: 11/15/1996    Years since quitting: 25.9   Smokeless tobacco: Never  Vaping Use   Vaping Use: Never used  Substance and Sexual Activity   Alcohol use: No    Alcohol/week: 0.0 standard drinks of alcohol   Drug use: No    Comment: uses CBD oil    Sexual activity: Never  Other Topics Concern   Not on file  Social History Narrative   Lives alone.  Divorced. Two daughters- in 9s, 3 grandchildren.   Does not know family history- raised in [facility].        Desires CPR.  Does not have HPOA.   She would possibly want life support if for short period of time . Unsure about feeding tube.   Social Determinants of Health   Financial Resource Strain: Low Risk  (09/30/2022)   Overall Financial Resource Strain (CARDIA)    Difficulty of Paying Living Expenses: Not hard at all  Food Insecurity: No Food Insecurity (09/30/2022)   Hunger Vital Sign    Worried About Running Out of Food in the Last Year: Never true    Ran Out of Food in the Last Year: Never true  Transportation Needs: No Transportation Needs (09/30/2022)   PRAPARE - Administrator, Civil Service (Medical): No    Lack of Transportation (Non-Medical): No  Physical Activity: Inactive (09/30/2022)   Exercise Vital Sign    Days of Exercise per Week: 0 days    Minutes of Exercise per Session: 0 min  Stress: No Stress Concern Present (09/30/2022)   [employer] of Occupational Health - Occupational Stress Questionnaire    Feeling of Stress : Not at all  Social Connections: Socially Isolated (09/30/2022)   Social Connection and Isolation Panel [NHANES]    Frequency of Communication with Friends and Family: More than three times a week    Frequency of Social Gatherings with Friends and Family: Twice a week    Attends Religious Services: Never    Database administrator or Organizations:  No    Attends Banker Meetings: Never    Marital Status: Divorced  Catering manager Violence: Not At Risk (09/30/2022)   Humiliation, Afraid, Rape, and Kick questionnaire    Fear of Current or Ex-Partner: No    Emotionally Abused: No    Physically Abused: No    Sexually Abused: No    FAMILY HISTORY: Family History  Problem Relation Age of Onset   Colon cancer Daughter     ALLERGIES:  is allergic to codeine, demerol [meperidine], fleet phospho-soda [sodium phosphates], latex, other, and prednisone.  MEDICATIONS:  Current Outpatient Medications  Medication Sig Dispense Refill   acetaminophen (TYLENOL) 650 MG CR tablet Take 1,000 mg by mouth every 4 (four) hours as needed.     albuterol (PROVENTIL) (2.5 MG/3ML) 0.083% nebulizer solution Take 3 mLs (2.5 mg total) by nebulization every 6 (six) hours as needed for wheezing or shortness  of breath. 150 mL 1   Budeson-Glycopyrrol-Formoterol (BREZTRI AEROSPHERE) 160-9-4.8 MCG/ACT AERO INHALE 2 PUFFS TWICE DAILY 3 each 0   buPROPion (WELLBUTRIN XL) 300 MG 24 hr tablet TAKE 1 TABLET EVERY DAY 90 tablet 0   calcitRIOL (ROCALTROL) 0.25 MCG capsule Take 0.25 mcg by mouth daily.     Cholecalciferol (VITAMIN D3) 1000 units CAPS Take 1,000 Units by mouth daily.     ferrous sulfate 325 (65 FE) MG EC tablet Take 325 mg by mouth 3 (three) times daily with meals.     fluticasone (FLONASE) 50 MCG/ACT nasal spray Place 1 spray into both nostrils as needed for allergies or rhinitis.     lisinopril (ZESTRIL) 20 MG tablet Take 1 tablet (20 mg total) by mouth daily. 90 tablet 1   Multiple Vitamin (MULTIVITAMIN) tablet Take 1 tablet by mouth at bedtime.      propranolol (INDERAL) 40 MG tablet TAKE 1 TABLET TWICE DAILY 180 tablet 0   sertraline (ZOLOFT) 100 MG tablet TAKE 1 TABLET EVERY DAY 90 tablet 0   No current facility-administered medications for this visit.      Marland Kitchen  PHYSICAL EXAMINATION: ECOG PERFORMANCE STATUS: 1 - Symptomatic but  completely ambulatory  Vitals:   10/22/22 1423  BP: (!) 130/57  Pulse: 71  Resp: 18  SpO2: 97%   Filed Weights   10/22/22 1419  Weight: 238 lb (108 kg)    Physical Exam HENT:     Head: Normocephalic and atraumatic.     Mouth/Throat:     Pharynx: No oropharyngeal exudate.  Eyes:     Pupils: Pupils are equal, round, and reactive to light.  Cardiovascular:     Rate and Rhythm: Normal rate and regular rhythm.  Pulmonary:     Effort: No respiratory distress.     Breath sounds: No wheezing.  Abdominal:     General: Bowel sounds are normal. There is no distension.     Palpations: Abdomen is soft. There is no mass.     Tenderness: There is no abdominal tenderness. There is no guarding or rebound.  Musculoskeletal:        General: No tenderness. Normal range of motion.     Cervical back: Normal range of motion and neck supple.  Skin:    General: Skin is warm.  Neurological:     Mental Status: She is alert and oriented to person, place, and time.  Psychiatric:        Mood and Affect: Affect normal.      LABORATORY DATA:  I have reviewed the data as listed Lab Results  Component Value Date   WBC 5.8 10/22/2022   HGB 11.7 (L) 10/22/2022   HCT 36.5 10/22/2022   MCV 93.8 10/22/2022   PLT 251 10/22/2022   Recent Labs    04/19/22 1255 10/22/22 1406  NA 138 137  K 4.7 4.7  CL 100 100  CO2 30 26  GLUCOSE 112* 109*  BUN 25* 26*  CREATININE 1.38* 1.19*  CALCIUM 9.3 9.4  GFRNONAA 40* 47*    RADIOGRAPHIC STUDIES: I have personally reviewed the radiological images as listed and agreed with the findings in the report. No results found.  ASSESSMENT & PLAN:   Anemia of chronic kidney failure, stage 3 (moderate) # Mild Anemia/IDA- sec to CKD stage III-DEC 2023- Iron sat- 12%;Ferritin-14 ;HOLD off IV venofer; [? cost].   # Currently on Iron sulfate [sec to constipation] Continue gentle iron  BID. Hb 11.7-  HOLD venofer; if  not improved then venofer.   # CKD- stage  III [Dr.Lateef-X]-GFR 40-   STABLE.   # DISPOSITION: #  HOLD venofer # follow up in 3  Months- labs/cbc/bmpiron studies/fertitin-/possible venofer-Dr.B   All questions were answered. The patient knows to call the clinic with any problems, questions or concerns.    Earna Coder, MD 10/22/2022 3:09 PM

## 2022-10-22 NOTE — Assessment & Plan Note (Addendum)
#   Mild Anemia/IDA- sec to CKD stage III-DEC 2023- Iron sat- 12%;Ferritin-14 ;HOLD off IV venofer; [? cost].   # Currently on Iron sulfate [sec to constipation] Continue gentle iron  BID. Hb 11.7-  HOLD venofer; if not improved then venofer.   # CKD- stage III [Dr.Lateef-X]-GFR 40-   STABLE.   # DISPOSITION: #  HOLD venofer # follow up in 3  Months- MD; labs/cbc/bmpiron studies/fertitin-/possible venofer-Dr.B

## 2022-10-22 NOTE — Patient Instructions (Signed)
#  Recommend gentle iron [iron biglycinate; 28 mg ] 1 pill a day.  This pill is unlikely to cause stomach upset or cause constipation.  

## 2022-10-29 ENCOUNTER — Other Ambulatory Visit: Payer: Self-pay | Admitting: Internal Medicine

## 2022-10-29 DIAGNOSIS — R251 Tremor, unspecified: Secondary | ICD-10-CM

## 2022-10-29 DIAGNOSIS — F419 Anxiety disorder, unspecified: Secondary | ICD-10-CM

## 2022-10-29 DIAGNOSIS — I10 Essential (primary) hypertension: Secondary | ICD-10-CM

## 2022-11-01 NOTE — Telephone Encounter (Signed)
Requested Prescriptions  Pending Prescriptions Disp Refills   albuterol (VENTOLIN HFA) 108 (90 Base) MCG/ACT inhaler [Pharmacy Med Name: ALBUTEROL SULFATE HFA 108 (90 Base) MCG/ACT Aerosol Solution] 1 each 0    Sig: INHALE 2 PUFFS EVERY 6 HOURS AS NEEDED FOR WHEEZING OR SHORTNESS OF BREATH.     Pulmonology:  Beta Agonists 2 Passed - 10/29/2022  4:57 PM      Passed - Last BP in normal range    BP Readings from Last 1 Encounters:  10/22/22 (!) 130/57         Passed - Last Heart Rate in normal range    Pulse Readings from Last 1 Encounters:  10/22/22 71         Passed - Valid encounter within last 12 months    Recent Outpatient Visits           2 months ago Primary hypertension   [place] [facility] [place], Salvadore Oxford, NP   3 months ago Primary hypertension   [facility] [place], Salvadore Oxford, NP   5 months ago Age-related osteoporosis without current pathological fracture   [place] [facility] [place], Salvadore Oxford, NP   5 months ago Dark urine   [place] [facility] [place], Salvadore Oxford, NP   1 year ago Encounter for general adult medical examination with abnormal findings   [place] [facility] [place], Salvadore Oxford, NP       Future Appointments             In 1 month Baity, Salvadore Oxford, NP [facility]             propranolol (INDERAL) 40 MG tablet [Pharmacy Med Name: PROPRANOLOL HYDROCHLORIDE 40 MG Tablet] 180 tablet 3    Sig: TAKE 1 TABLET TWICE DAILY     Cardiovascular:  Beta Blockers Passed - 10/29/2022  4:57 PM      Passed - Last BP in normal range    BP Readings from Last 1 Encounters:  10/22/22 (!) 130/57         Passed - Last Heart Rate in normal range    Pulse Readings from Last 1 Encounters:  10/22/22 71         Passed - Valid encounter within last 6 months    Recent Outpatient Visits           2 months ago Primary hypertension   [facility] [place], Salvadore Oxford, NP   3 months ago Primary hypertension   [facility] [place], Salvadore Oxford, NP   5 months ago Age-related osteoporosis without current pathological fracture   [place] [facility] [place], Salvadore Oxford, NP   5 months ago Dark urine   [place] [facility] [place], Salvadore Oxford, NP   1 year ago Encounter for general adult medical examination with abnormal findings   [place] [facility] [place], Salvadore Oxford, NP       Future Appointments             In 1 month [place], Salvadore Oxford, NP [place] [facility]             buPROPion (WELLBUTRIN XL) 300 MG 24 hr tablet [Pharmacy Med Name: BUPROPION HYDROCHLORIDE ER (XL) 300 MG Tablet Extended Release 24 Hour] 90 tablet 3  Sig: TAKE 1 TABLET EVERY DAY     Psychiatry: Antidepressants - bupropion Failed - 10/29/2022  4:57 PM      Failed - Cr in normal range and within 360 days    Creat  Date Value Ref Range Status  09/24/2021 1.00 0.60 - 1.00 mg/dL Final   Creatinine, Ser  Date Value Ref Range Status  10/22/2022 1.19 (H) 0.44 - 1.00 mg/dL Final         Failed - AST in normal range and within 360 days    AST  Date Value Ref Range Status  09/24/2021 16 10 - 35 U/L Final   SGOT(AST)  Date Value Ref Range Status  02/25/2014 15 15 - 37 Unit/L Final         Failed - ALT in normal range and within 360 days    ALT  Date Value Ref Range Status  09/24/2021 12 6 - 29 U/L Final   SGPT (ALT)  Date Value Ref Range Status  02/25/2014 23 U/L Final    Comment:    14-63 NOTE: New Reference Range 12/04/13          Passed - Completed PHQ-2 or PHQ-9 in the last 360 days      Passed - Last BP in normal range    BP Readings from Last 1 Encounters:  10/22/22 (!) 130/57         Passed - Valid encounter within last 6 months    Recent Outpatient Visits           2 months ago Primary hypertension    [place] [facility] [place], Salvadore Oxford, NP   3 months ago Primary hypertension   [place] [facility] [place], Salvadore Oxford, NP   5 months ago Age-related osteoporosis without current pathological fracture   [place] [facility] [place], Salvadore Oxford, NP   5 months ago Dark urine   [facility] [place], Salvadore Oxford, NP   1 year ago Encounter for general adult medical examination with abnormal findings   [place] [facility] [place], Salvadore Oxford, NP       Future Appointments             In 1 month [place], Salvadore Oxford, NP [place] [facility]             lisinopril (ZESTRIL) 5 MG tablet [Pharmacy Med Name: LISINOPRIL 5 MG Tablet] 90 tablet 3    Sig: TAKE 1 TABLET EVERY DAY     Cardiovascular:  ACE Inhibitors Failed - 10/29/2022  4:57 PM      Failed - Cr in normal range and within 180 days    Creat  Date Value Ref Range Status  09/24/2021 1.00 0.60 - 1.00 mg/dL Final   Creatinine, Ser  Date Value Ref Range Status  10/22/2022 1.19 (H) 0.44 - 1.00 mg/dL Final         Passed - K in normal range and within 180 days    Potassium  Date Value Ref Range Status  10/22/2022 4.7 3.5 - 5.1 mmol/L Final  02/25/2014 4.2 3.5 - 5.1 mmol/L Final         Passed - Patient is not pregnant      Passed - Last BP in normal range    BP Readings from Last 1 Encounters:  10/22/22 (!) 130/57         Passed -  Valid encounter within last 6 months    Recent Outpatient Visits           2 months ago Primary hypertension   [place] [facility] [place], Salvadore Oxford, NP   3 months ago Primary hypertension   [facility] [place], Salvadore Oxford, NP   5 months ago Age-related osteoporosis without current pathological fracture   [place] [facility] [place], Salvadore Oxford, NP   5 months ago Dark urine   [place] [facility] [place],  Salvadore Oxford, NP   1 year ago Encounter for general adult medical examination with abnormal findings   [place] [facility] [place], Salvadore Oxford, NP       Future Appointments             In 1 month Baity, Salvadore Oxford, NP [facility]             sertraline (ZOLOFT) 100 MG tablet [Pharmacy Med Name: SERTRALINE HYDROCHLORIDE 100 MG Tablet] 90 tablet 3    Sig: TAKE 1 TABLET EVERY DAY     Psychiatry:  Antidepressants - SSRI - sertraline Failed - 10/29/2022  4:57 PM      Failed - AST in normal range and within 360 days    AST  Date Value Ref Range Status  09/24/2021 16 10 - 35 U/L Final   SGOT(AST)  Date Value Ref Range Status  02/25/2014 15 15 - 37 Unit/L Final         Failed - ALT in normal range and within 360 days    ALT  Date Value Ref Range Status  09/24/2021 12 6 - 29 U/L Final   SGPT (ALT)  Date Value Ref Range Status  02/25/2014 23 U/L Final    Comment:    14-63 NOTE: New Reference Range 12/04/13          Passed - Completed PHQ-2 or PHQ-9 in the last 360 days      Passed - Valid encounter within last 6 months    Recent Outpatient Visits           2 months ago Primary hypertension   [place] [facility] [place], Salvadore Oxford, NP   3 months ago Primary hypertension   [place] [facility] [place], [place], NP   5 months ago Age-related osteoporosis without current pathological fracture   [place] [facility] [place], Salvadore Oxford, NP   5 months ago Dark urine   [place] [facility] [place], Salvadore Oxford, NP   1 year ago Encounter for general adult medical examination with abnormal findings   [place] [facility] [place], Salvadore Oxford, NP       Future Appointments             In 1 month Baity, Salvadore Oxford, NP [place] [facility]

## 2022-11-01 NOTE — Telephone Encounter (Signed)
Requested medication (s) are due for refill today: Both meds due 11/17/22  (Mail order pharmacy)  Requested medication (s) are on the active medication list:yes   Last refill: Both meds 08/18/22  #90  0 refills  Future visit scheduled yes 12/02/22  Notes to clinic:Failed due to labs, please review. Thank you.  Requested Prescriptions  Pending Prescriptions Disp Refills   buPROPion (WELLBUTRIN XL) 300 MG 24 hr tablet [Pharmacy Med Name: BUPROPION HYDROCHLORIDE ER (XL) 300 MG Tablet Extended Release 24 Hour] 90 tablet 3    Sig: TAKE 1 TABLET EVERY DAY     Psychiatry: Antidepressants - bupropion Failed - 10/29/2022  4:57 PM      Failed - Cr in normal range and within 360 days    Creat  Date Value Ref Range Status  09/24/2021 1.00 0.60 - 1.00 mg/dL Final   Creatinine, Ser  Date Value Ref Range Status  10/22/2022 1.19 (H) 0.44 - 1.00 mg/dL Final         Failed - AST in normal range and within 360 days    AST  Date Value Ref Range Status  09/24/2021 16 10 - 35 U/L Final   SGOT(AST)  Date Value Ref Range Status  02/25/2014 15 15 - 37 Unit/L Final         Failed - ALT in normal range and within 360 days    ALT  Date Value Ref Range Status  09/24/2021 12 6 - 29 U/L Final   SGPT (ALT)  Date Value Ref Range Status  02/25/2014 23 U/L Final    Comment:    14-63 NOTE: New Reference Range 12/04/13          Passed - Completed PHQ-2 or PHQ-9 in the last 360 days      Passed - Last BP in normal range    BP Readings from Last 1 Encounters:  10/22/22 (!) 130/57         Passed - Valid encounter within last 6 months    Recent Outpatient Visits           2 months ago Primary hypertension   [place] [facility] [place], Salvadore Oxford, NP   3 months ago Primary hypertension   [place] [facility] [place], Salvadore Oxford, NP   5 months ago Age-related osteoporosis without current pathological fracture   [place] [facility] [place], Salvadore Oxford, NP   5 months ago Dark urine   [place] [facility] [place], Salvadore Oxford, NP   1 year ago Encounter for general adult medical examination with abnormal findings   [place] [facility] [place], Salvadore Oxford, NP       Future Appointments             In 1 month Baity, Salvadore Oxford, NP [place] [facility]             sertraline (ZOLOFT) 100 MG tablet [Pharmacy Med Name: SERTRALINE HYDROCHLORIDE 100 MG Tablet] 90 tablet 3    Sig: TAKE 1 TABLET EVERY DAY     Psychiatry:  Antidepressants - SSRI - sertraline Failed - 10/29/2022  4:57 PM      Failed - AST in normal range and within 360 days    AST  Date Value Ref Range Status  09/24/2021 16 10 - 35 U/L Final   SGOT(AST)  Date Value Ref Range Status  02/25/2014 15 15 - 37  Unit/L Final         Failed - ALT in normal range and within 360 days    ALT  Date Value Ref Range Status  09/24/2021 12 6 - 29 U/L Final   SGPT (ALT)  Date Value Ref Range Status  02/25/2014 23 U/L Final    Comment:    14-63 NOTE: New Reference Range 12/04/13          Passed - Completed PHQ-2 or PHQ-9 in the last 360 days      Passed - Valid encounter within last 6 months    Recent Outpatient Visits           2 months ago Primary hypertension   [place] [facility] [place], Salvadore Oxford, NP   3 months ago Primary hypertension   [place] [place], Salvadore Oxford, NP   5 months ago Age-related osteoporosis without current pathological fracture   [place] [facility] [address], Salvadore Oxford, NP   5 months ago Dark urine   [place] [facility] [place], Salvadore Oxford, NP   1 year ago Encounter for general adult medical examination with abnormal findings   [place] [facility] [place], Salvadore Oxford, NP       Future Appointments             In 1 month Baity, Salvadore Oxford, NP [facility]             Signed Prescriptions Disp Refills   propranolol (INDERAL) 40 MG tablet 180 tablet 0    Sig: TAKE 1 TABLET TWICE DAILY     Cardiovascular:  Beta Blockers Passed - 10/29/2022  4:57 PM      Passed - Last BP in normal range    BP Readings from Last 1 Encounters:  10/22/22 (!) 130/57         Passed - Last Heart Rate in normal range    Pulse Readings from Last 1 Encounters:  10/22/22 71         Passed - Valid encounter within last 6 months    Recent Outpatient Visits           2 months ago Primary hypertension   [place] [facility] [place], Salvadore Oxford, NP   3 months ago Primary hypertension   [place] [facility] [place], Salvadore Oxford, NP   5 months ago Age-related osteoporosis without current pathological fracture   [place] [facility] [place], Salvadore Oxford, NP   5 months ago Dark urine   [place] [facility] [place], Salvadore Oxford, NP   1 year ago Encounter for general adult medical examination with abnormal findings   [place] [place], Salvadore Oxford, NP       Future Appointments             In 1 month Baity, Salvadore Oxford, NP [place]            Refused Prescriptions Disp Refills   albuterol (VENTOLIN HFA) 108 (90 Base) MCG/ACT inhaler [Pharmacy Med Name: ALBUTEROL SULFATE HFA 108 (90 Base) MCG/ACT Aerosol Solution] 1 each 0    Sig: INHALE 2 PUFFS EVERY 6 HOURS AS NEEDED FOR WHEEZING OR SHORTNESS OF BREATH.     Pulmonology:  Beta Agonists 2 Passed - 10/29/2022  4:57 PM  Passed - Last BP in normal range    BP Readings from Last 1 Encounters:  10/22/22 (!) 130/57         Passed - Last Heart Rate in normal range    Pulse Readings from Last 1 Encounters:  10/22/22 71         Passed - Valid encounter within last 12 months    Recent Outpatient Visits           2 months ago Primary hypertension   [place] [facility] [place],  Salvadore Oxford, NP   3 months ago Primary hypertension   [place] [facility] [place], [place], NP   5 months ago Age-related osteoporosis without current pathological fracture   [place] [facility] [place], Salvadore Oxford, NP   5 months ago Dark urine   [place] [facility] [place], Salvadore Oxford, NP   1 year ago Encounter for general adult medical examination with abnormal findings   [place] [facility] [place], Salvadore Oxford, NP       Future Appointments             In 1 month [place], Salvadore Oxford, NP [place] [facility]             lisinopril (ZESTRIL) 5 MG tablet [Pharmacy Med Name: LISINOPRIL 5 MG Tablet] 90 tablet 3    Sig: TAKE 1 TABLET EVERY DAY     Cardiovascular:  ACE Inhibitors Failed - 10/29/2022  4:57 PM      Failed - Cr in normal range and within 180 days    Creat  Date Value Ref Range Status  09/24/2021 1.00 0.60 - 1.00 mg/dL Final   Creatinine, Ser  Date Value Ref Range Status  10/22/2022 1.19 (H) 0.44 - 1.00 mg/dL Final         Passed - K in normal range and within 180 days    Potassium  Date Value Ref Range Status  10/22/2022 4.7 3.5 - 5.1 mmol/L Final  02/25/2014 4.2 3.5 - 5.1 mmol/L Final         Passed - Patient is not pregnant      Passed - Last BP in normal range    BP Readings from Last 1 Encounters:  10/22/22 (!) 130/57         Passed - Valid encounter within last 6 months    Recent Outpatient Visits           2 months ago Primary hypertension   [place] [facility] [place], Salvadore Oxford, NP   3 months ago Primary hypertension   [place] [facility] [place], Salvadore Oxford, NP   5 months ago Age-related osteoporosis without current pathological fracture   [place] [facility] [place], Salvadore Oxford, NP   5 months ago Dark urine   [place] [facility] [place], Salvadore Oxford, NP   1 year ago Encounter for general  adult medical examination with abnormal findings   [place] [facility] [place], Salvadore Oxford, NP       Future Appointments             In 1 month Baity, Salvadore Oxford, NP [place] [facility]

## 2022-11-01 NOTE — Telephone Encounter (Signed)
Requested Prescriptions  Pending Prescriptions Disp Refills   propranolol (INDERAL) 40 MG tablet [Pharmacy Med Name: PROPRANOLOL HYDROCHLORIDE 40 MG Tablet] 180 tablet 0    Sig: TAKE 1 TABLET TWICE DAILY     Cardiovascular:  Beta Blockers Passed - 10/29/2022  4:57 PM      Passed - Last BP in normal range    BP Readings from Last 1 Encounters:  10/22/22 (!) 130/57         Passed - Last Heart Rate in normal range    Pulse Readings from Last 1 Encounters:  10/22/22 71         Passed - Valid encounter within last 6 months    Recent Outpatient Visits           2 months ago Primary hypertension   [place] [facility] [place], Salvadore Oxford, NP   3 months ago Primary hypertension   [place] [facility] [place], Salvadore Oxford, NP   5 months ago Age-related osteoporosis without current pathological fracture   [place] [facility] [place], Salvadore Oxford, NP   5 months ago Dark urine   [place] [facility] [place], Salvadore Oxford, NP   1 year ago Encounter for general adult medical examination with abnormal findings   [place] [facility] [place], Salvadore Oxford, NP       Future Appointments             In 1 month [place], Salvadore Oxford, NP [place] [facility]             buPROPion (WELLBUTRIN XL) 300 MG 24 hr tablet [Pharmacy Med Name: BUPROPION HYDROCHLORIDE ER (XL) 300 MG Tablet Extended Release 24 Hour] 90 tablet 3    Sig: TAKE 1 TABLET EVERY DAY     Psychiatry: Antidepressants - bupropion Failed - 10/29/2022  4:57 PM      Failed - Cr in normal range and within 360 days    Creat  Date Value Ref Range Status  09/24/2021 1.00 0.60 - 1.00 mg/dL Final   Creatinine, Ser  Date Value Ref Range Status  10/22/2022 1.19 (H) 0.44 - 1.00 mg/dL Final         Failed - AST in normal range and within 360 days    AST  Date Value Ref Range Status  09/24/2021 16 10 - 35 U/L Final   SGOT(AST)  Date Value  Ref Range Status  02/25/2014 15 15 - 37 Unit/L Final         Failed - ALT in normal range and within 360 days    ALT  Date Value Ref Range Status  09/24/2021 12 6 - 29 U/L Final   SGPT (ALT)  Date Value Ref Range Status  02/25/2014 23 U/L Final    Comment:    14-63 NOTE: New Reference Range 12/04/13          Passed - Completed PHQ-2 or PHQ-9 in the last 360 days      Passed - Last BP in normal range    BP Readings from Last 1 Encounters:  10/22/22 (!) 130/57         Passed - Valid encounter within last 6 months    Recent Outpatient Visits           2 months ago Primary hypertension   [place] [facility] [place], Salvadore Oxford, NP   3 months ago  Primary hypertension   [place] [facility] [place], [place], NP   5 months ago Age-related osteoporosis without current pathological fracture   [place] [facility] [place], Salvadore Oxford, NP   5 months ago Dark urine   [place] [facility] [place], Salvadore Oxford, NP   1 year ago Encounter for general adult medical examination with abnormal findings   [place] [facility] [place], Salvadore Oxford, NP       Future Appointments             In 1 month [place], Salvadore Oxford, NP [place] [facility]             sertraline (ZOLOFT) 100 MG tablet [Pharmacy Med Name: SERTRALINE HYDROCHLORIDE 100 MG Tablet] 90 tablet 3    Sig: TAKE 1 TABLET EVERY DAY     Psychiatry:  Antidepressants - SSRI - sertraline Failed - 10/29/2022  4:57 PM      Failed - AST in normal range and within 360 days    AST  Date Value Ref Range Status  09/24/2021 16 10 - 35 U/L Final   SGOT(AST)  Date Value Ref Range Status  02/25/2014 15 15 - 37 Unit/L Final         Failed - ALT in normal range and within 360 days    ALT  Date Value Ref Range Status  09/24/2021 12 6 - 29 U/L Final   SGPT (ALT)  Date Value Ref Range Status  02/25/2014 23 U/L Final    Comment:     14-63 NOTE: New Reference Range 12/04/13          Passed - Completed PHQ-2 or PHQ-9 in the last 360 days      Passed - Valid encounter within last 6 months    Recent Outpatient Visits           2 months ago Primary hypertension   [place] [facility] [place], Salvadore Oxford, NP   3 months ago Primary hypertension   [place] [facility] [place], [place], NP   5 months ago Age-related osteoporosis without current pathological fracture   [place] [facility] [place], Salvadore Oxford, NP   5 months ago Dark urine   [place] [facility] [place], Salvadore Oxford, NP   1 year ago Encounter for general adult medical examination with abnormal findings   [place] [facility] [place], Salvadore Oxford, NP       Future Appointments             In 1 month [place], Salvadore Oxford, NP [place] [facility]            Refused Prescriptions Disp Refills   albuterol (VENTOLIN HFA) 108 (90 Base) MCG/ACT inhaler [Pharmacy Med Name: ALBUTEROL SULFATE HFA 108 (90 Base) MCG/ACT Aerosol Solution] 1 each 0    Sig: INHALE 2 PUFFS EVERY 6 HOURS AS NEEDED FOR WHEEZING OR SHORTNESS OF BREATH.     Pulmonology:  Beta Agonists 2 Passed - 10/29/2022  4:57 PM      Passed - Last BP in normal range    BP Readings from Last 1 Encounters:  10/22/22 (!) 130/57         Passed - Last Heart Rate in normal range    Pulse Readings from Last 1 Encounters:  10/22/22 71  Passed - Valid encounter within last 12 months    Recent Outpatient Visits           2 months ago Primary hypertension   [place] [facility] [place], Salvadore Oxford, NP   3 months ago Primary hypertension   [place] [facility] [place], [place], NP   5 months ago Age-related osteoporosis without current pathological fracture   [place] [facility] [place], Salvadore Oxford, NP   5 months ago Dark urine   [facility] [place], Salvadore Oxford, NP   1 year ago Encounter for general adult medical examination with abnormal findings   [place] [facility] [place], Salvadore Oxford, NP       Future Appointments             In 1 month [place], Salvadore Oxford, NP [place] [facility]             lisinopril (ZESTRIL) 5 MG tablet [Pharmacy Med Name: LISINOPRIL 5 MG Tablet] 90 tablet 3    Sig: TAKE 1 TABLET EVERY DAY     Cardiovascular:  ACE Inhibitors Failed - 10/29/2022  4:57 PM      Failed - Cr in normal range and within 180 days    Creat  Date Value Ref Range Status  09/24/2021 1.00 0.60 - 1.00 mg/dL Final   Creatinine, Ser  Date Value Ref Range Status  10/22/2022 1.19 (H) 0.44 - 1.00 mg/dL Final         Passed - K in normal range and within 180 days    Potassium  Date Value Ref Range Status  10/22/2022 4.7 3.5 - 5.1 mmol/L Final  02/25/2014 4.2 3.5 - 5.1 mmol/L Final         Passed - Patient is not pregnant      Passed - Last BP in normal range    BP Readings from Last 1 Encounters:  10/22/22 (!) 130/57         Passed - Valid encounter within last 6 months    Recent Outpatient Visits           2 months ago Primary hypertension   [place] [facility] [place], Salvadore Oxford, NP   3 months ago Primary hypertension   [place] [facility] [place], Salvadore Oxford, NP   5 months ago Age-related osteoporosis without current pathological fracture   [place] [facility] [place], Salvadore Oxford, NP   5 months ago Dark urine   [place] [facility] [place], Salvadore Oxford, NP   1 year ago Encounter for general adult medical examination with abnormal findings   [place] [facility] [place], Salvadore Oxford, NP       Future Appointments             In 1 month [place], Salvadore Oxford, NP [place] [facility]

## 2022-11-22 ENCOUNTER — Other Ambulatory Visit: Payer: Self-pay | Admitting: Internal Medicine

## 2022-11-22 DIAGNOSIS — Z1231 Encounter for screening mammogram for malignant neoplasm of breast: Secondary | ICD-10-CM

## 2022-12-01 ENCOUNTER — Ambulatory Visit
Admission: RE | Admit: 2022-12-01 | Discharge: 2022-12-01 | Disposition: A | Discharge status: Home / Self Care | Payer: Medicare HMO | Source: Ambulatory Visit | Attending: Internal Medicine | Admitting: Internal Medicine

## 2022-12-01 DIAGNOSIS — Z1231 Encounter for screening mammogram for malignant neoplasm of breast: Secondary | ICD-10-CM | POA: Insufficient documentation

## 2022-12-02 ENCOUNTER — Encounter: Payer: Self-pay | Admitting: Internal Medicine

## 2022-12-02 ENCOUNTER — Ambulatory Visit (INDEPENDENT_AMBULATORY_CARE_PROVIDER_SITE_OTHER): Payer: Medicare HMO | Admitting: Internal Medicine

## 2022-12-02 VITALS — BP 122/68 | HR 68 | Temp 96.6°F | Wt 239.0 lb

## 2022-12-02 DIAGNOSIS — R7309 Other abnormal glucose: Secondary | ICD-10-CM | POA: Diagnosis not present

## 2022-12-02 DIAGNOSIS — E781 Pure hyperglyceridemia: Secondary | ICD-10-CM | POA: Diagnosis not present

## 2022-12-02 DIAGNOSIS — Z23 Encounter for immunization: Secondary | ICD-10-CM

## 2022-12-02 DIAGNOSIS — Z0001 Encounter for general adult medical examination with abnormal findings: Secondary | ICD-10-CM | POA: Diagnosis not present

## 2022-12-02 DIAGNOSIS — Z6839 Body mass index (BMI) 39.0-39.9, adult: Secondary | ICD-10-CM

## 2022-12-02 MED ORDER — LISINOPRIL 20 MG PO TABS: 20.0000 mg | ORAL_TABLET | Freq: Every day | ORAL | 1 refills | Status: DC

## 2022-12-02 NOTE — Assessment & Plan Note (Signed)
Encouraged diet and exercise for weight loss ?

## 2022-12-02 NOTE — Progress Notes (Signed)
Subjective:    Patient ID: Kristie Byrd, female    DOB: 07-28-1945, 77 y.o.   MRN: [id number]  HPI  Patient presents to clinic today for her annual exam.  Flu: 01/2022 Tetanus: 02/2015 COVID: X 2 Pneumovax: 01/2017 Prevnar: 01/2014 Zostavax: 12/2012 Shingrix: 01/2017 Pap smear: 06/2016 Mammogram: 11/2022 Bone density: 09/2020 Colon screening: 01/2013 Vision screening: annually Dentist: as needed, dentures  Diet: She does eat lean meat. She consumes some fruits and veggies. She tries to avoid fried foods. She drinks mostly tea and lemonade, coffee Exercise: None   Review of Systems     Past Medical History:  Diagnosis Date   Arthritis    CKD (chronic kidney disease), stage III (HCC)    Colon polyps    COPD (chronic obstructive pulmonary disease) (HCC)    Hypertension    Iron deficiency anemia 09/27/2012   PONV (postoperative nausea and vomiting)    Shingles    Sleep apnea    starting CPAP in 2024   Uses walker    or cane   Wears dentures    full upper and lower   Wears hearing aid in right ear     Current Outpatient Medications  Medication Sig Dispense Refill   acetaminophen (TYLENOL) 650 MG CR tablet Take 1,000 mg by mouth every 4 (four) hours as needed.     albuterol (PROVENTIL) (2.5 MG/3ML) 0.083% nebulizer solution Take 3 mLs (2.5 mg total) by nebulization every 6 (six) hours as needed for wheezing or shortness of breath. 150 mL 1   Budeson-Glycopyrrol-Formoterol (BREZTRI AEROSPHERE) 160-9-4.8 MCG/ACT AERO INHALE 2 PUFFS TWICE DAILY 3 each 0   buPROPion (WELLBUTRIN XL) 300 MG 24 hr tablet TAKE 1 TABLET EVERY DAY 90 tablet 0   calcitRIOL (ROCALTROL) 0.25 MCG capsule Take 0.25 mcg by mouth daily.     Cholecalciferol (VITAMIN D3) 1000 units CAPS Take 1,000 Units by mouth daily.     ferrous sulfate 325 (65 FE) MG EC tablet Take 325 mg by mouth 3 (three) times daily with meals.     fluticasone (FLONASE) 50 MCG/ACT nasal spray Place 1 spray into both nostrils as  needed for allergies or rhinitis.     lisinopril (ZESTRIL) 20 MG tablet Take 1 tablet (20 mg total) by mouth daily. 90 tablet 1   Multiple Vitamin (MULTIVITAMIN) tablet Take 1 tablet by mouth at bedtime.      propranolol (INDERAL) 40 MG tablet TAKE 1 TABLET TWICE DAILY 180 tablet 0   sertraline (ZOLOFT) 100 MG tablet TAKE 1 TABLET EVERY DAY 90 tablet 0   No current facility-administered medications for this visit.    Allergies  Allergen Reactions   Codeine Nausea And Vomiting   Demerol [Meperidine] Nausea And Vomiting   Fleet Phospho-Soda [Sodium Phosphates] Nausea And Vomiting    Vomiting    Latex Rash    Blisters   Other Other (See Comments)    Band-Aid, blister   Prednisone Other (See Comments)    Chest pressure    Family History  Problem Relation Age of Onset   Colon cancer Daughter     Social History   Socioeconomic History   Marital status: Divorced    Spouse name: Not on file   Number of children: 2   Years of education: Not on file   Highest education level: Not on file  Occupational History   Occupation: retired  Tobacco Use   Smoking status: Former    Current packs/day: 0.00    Average  packs/day: 1 pack/day for 37.0 years (37.0 ttl pk-yrs)    Types: Cigarettes    Start date: 11/16/1959    Quit date: 11/15/1996    Years since quitting: 26.0   Smokeless tobacco: Never  Vaping Use   Vaping status: Never Used  Substance and Sexual Activity   Alcohol use: No    Alcohol/week: 0.0 standard drinks of alcohol   Drug use: No    Comment: uses CBD oil    Sexual activity: Never  Other Topics Concern   Not on file  Social History Narrative   Lives alone.  Divorced. Two daughters- in 63s, 3 grandchildren.   Does not know family history- raised in [facility].        Desires CPR.  Does not have HPOA.   She would possibly want life support if for short period of time . Unsure about feeding tube.   Social Determinants of Health   Financial Resource  Strain: Low Risk  (09/30/2022)   Overall Financial Resource Strain (CARDIA)    Difficulty of Paying Living Expenses: Not hard at all  Food Insecurity: No Food Insecurity (09/30/2022)   Hunger Vital Sign    Worried About Running Out of Food in the Last Year: Never true    Ran Out of Food in the Last Year: Never true  Transportation Needs: No Transportation Needs (09/30/2022)   PRAPARE - Administrator, Civil Service (Medical): No    Lack of Transportation (Non-Medical): No  Physical Activity: Inactive (09/30/2022)   Exercise Vital Sign    Days of Exercise per Week: 0 days    Minutes of Exercise per Session: 0 min  Stress: No Stress Concern Present (09/30/2022)   [employer] of Occupational Health - Occupational Stress Questionnaire    Feeling of Stress : Not at all  Social Connections: Socially Isolated (09/30/2022)   Social Connection and Isolation Panel [NHANES]    Frequency of Communication with Friends and Family: More than three times a week    Frequency of Social Gatherings with Friends and Family: Twice a week    Attends Religious Services: Never    Database administrator or Organizations: No    Attends Banker Meetings: Never    Marital Status: Divorced  Catering manager Violence: Not At Risk (09/30/2022)   Humiliation, Afraid, Rape, and Kick questionnaire    Fear of Current or Ex-Partner: No    Emotionally Abused: No    Physically Abused: No    Sexually Abused: No     Constitutional: Patient reports fatigue.  Denies fever, malaise, headache or abrupt weight changes.  HEENT: Denies eye pain, eye redness, ear pain, ringing in the ears, wax buildup, runny nose, nasal congestion, bloody nose, or sore throat. Respiratory: Denies difficulty breathing, shortness of breath, cough or sputum production.   Cardiovascular: Denies chest pain, chest tightness, palpitations or swelling in the hands or feet.  Gastrointestinal: Denies abdominal pain, bloating,  constipation, diarrhea or blood in the stool.  GU: Denies urgency, frequency, pain with urination, burning sensation, blood in urine, odor or discharge. Musculoskeletal: Patient reports chronic back and  knee pain.  Denies decrease in range of motion, difficulty with gait, muscle pain or joint swelling.  Skin: Denies redness, rashes, lesions or ulcercations.  Neurological: Patient reports tremor.  Denies dizziness, difficulty with memory, difficulty with speech or problems with balance and coordination.  Psych: Patient has a history of anxiety and depression.  Denies SI/HI.  No  other specific complaints in a complete review of systems (except as listed in HPI above).  Objective:   Physical Exam   BP 122/68 (BP Location: Left Arm, Patient Position: Sitting, Cuff Size: Large)   Pulse 68   Temp (!) 96.6 F (35.9 C) (Temporal)   Wt 239 lb (108.4 kg)   SpO2 96%   BMI 39.77 kg/m   Wt Readings from Last 3 Encounters:  10/22/22 238 lb (108 kg)  09/30/22 239 lb 6.4 oz (108.6 kg)  08/09/22 237 lb (107.5 kg)    General: Appears her stated age, obese, in NAD. Skin: Warm, dry and intact.  HEENT: Head: normal shape and size; Eyes: sclera white, no icterus, conjunctiva pink, PERRLA and EOMs intact;  Neck:  Neck supple, trachea midline. No masses, lumps or thyromegaly present.  Cardiovascular: Normal rate and rhythm. S1,S2 noted.  No murmur, rubs or gallops noted. No JVD or BLE edema. No carotid bruits noted. Pulmonary/Chest: Normal effort and positive vesicular breath sounds. No respiratory distress. No wheezes, rales or ronchi noted.  Abdomen: Soft and nontender. Normal bowel sounds. No distention or masses noted. Liver, spleen and kidneys non palpable. Musculoskeletal: Strength 5/5 BUE/BLE. Gait slow and steady with use of walker. Neurological: Alert and oriented. Cranial nerves II-XII grossly intact. Coordination normal.  Psychiatric: Mood and affect normal. Behavior is normal. Judgment and  thought content normal.    BMET    Component Value Date/Time   NA 137 10/22/2022 1406   NA 137 02/25/2014 1404   K 4.7 10/22/2022 1406   K 4.2 02/25/2014 1404   CL 100 10/22/2022 1406   CL 102 02/25/2014 1404   CO2 26 10/22/2022 1406   CO2 28 02/25/2014 1404   GLUCOSE 109 (H) 10/22/2022 1406   GLUCOSE 124 (H) 02/25/2014 1404   BUN 26 (H) 10/22/2022 1406   BUN 20 02/10/2017 0000   BUN 24 (H) 02/25/2014 1404   CREATININE 1.19 (H) 10/22/2022 1406   CREATININE 1.00 09/24/2021 1514   CALCIUM 9.4 10/22/2022 1406   CALCIUM 9.7 04/06/2021 1322   GFRNONAA 47 (L) 10/22/2022 1406   GFRNONAA 44 (L) 02/25/2014 1404   GFRNONAA 45 (L) 10/26/2013 1330   GFRAA 56 (L) 10/03/2019 1324   GFRAA 54 (L) 02/25/2014 1404   GFRAA 52 (L) 10/26/2013 1330    Lipid Panel     Component Value Date/Time   CHOL 181 06/03/2022 1143   TRIG 102 06/03/2022 1143   HDL 66 06/03/2022 1143   CHOLHDL 2.7 06/03/2022 1143   VLDL 37 04/02/2020 1242   LDLCALC 95 06/03/2022 1143    CBC    Component Value Date/Time   WBC 5.8 10/22/2022 1406   RBC 3.89 10/22/2022 1406   HGB 11.7 (L) 10/22/2022 1406   HGB 11.4 (L) 02/25/2014 1404   HCT 36.5 10/22/2022 1406   HCT 34.9 (L) 02/25/2014 1404   PLT 251 10/22/2022 1406   PLT 256 02/25/2014 1404   MCV 93.8 10/22/2022 1406   MCV 89 02/25/2014 1404   MCH 30.1 10/22/2022 1406   MCHC 32.1 10/22/2022 1406   RDW 13.4 10/22/2022 1406   RDW 14.7 (H) 02/25/2014 1404   LYMPHSABS 1.0 10/22/2022 1406   LYMPHSABS 1.1 02/25/2014 1404   MONOABS 0.7 10/22/2022 1406   MONOABS 0.5 02/25/2014 1404   EOSABS 0.1 10/22/2022 1406   EOSABS 0.0 02/25/2014 1404   BASOSABS 0.0 10/22/2022 1406   BASOSABS 0.1 02/25/2014 1404    Hgb A1C Lab Results  Component Value  Date   HGBA1C 5.5 09/24/2021           Assessment & Plan:   Preventative health maintenance:  Encouraged her to get a flu shot in the fall Tetanus UTD Encouraged her to get her COVID booster Prevnar 20  today Zostavax UTD Encouraged her to get her second Shingrix vaccine She no longer needs to screen for cervical cancer Mammogram UTD Bone density UTD She no longer needs to screen for colon cancer given her age Encouraged her to consume a balanced diet and exercise regimen Advised her to see an eye doctor and dentist annually We will check CBC, c-Met, lipid, A1c today  RTC in 6 months, follow-up chronic conditions Nicki Reaper, NP

## 2022-12-02 NOTE — Patient Instructions (Signed)
Health Maintenance for Postmenopausal Women Menopause is a normal process in which your ability to get pregnant comes to an end. This process happens slowly over many months or years, usually between the ages of 48 and 55. Menopause is complete when you have missed your menstrual period for 12 months. It is important to talk with your health care provider about some of the most common conditions that affect women after menopause (postmenopausal women). These include heart disease, cancer, and bone loss (osteoporosis). Adopting a healthy lifestyle and getting preventive care can help to promote your health and wellness. The actions you take can also lower your chances of developing some of these common conditions. What are the signs and symptoms of menopause? During menopause, you may have the following symptoms: Hot flashes. These can be moderate or severe. Night sweats. Decrease in sex drive. Mood swings. Headaches. Tiredness (fatigue). Irritability. Memory problems. Problems falling asleep or staying asleep. Talk with your health care provider about treatment options for your symptoms. Do I need hormone replacement therapy? Hormone replacement therapy is effective in treating symptoms that are caused by menopause, such as hot flashes and night sweats. Hormone replacement carries certain risks, especially as you become older. If you are thinking about using estrogen or estrogen with progestin, discuss the benefits and risks with your health care provider. How can I reduce my risk for heart disease and stroke? The risk of heart disease, heart attack, and stroke increases as you age. One of the causes may be a change in the body's hormones during menopause. This can affect how your body uses dietary fats, triglycerides, and cholesterol. Heart attack and stroke are medical emergencies. There are many things that you can do to help prevent heart disease and stroke. Watch your blood pressure High  blood pressure causes heart disease and increases the risk of stroke. This is more likely to develop in people who have high blood pressure readings or are overweight. Have your blood pressure checked: Every 3-5 years if you are 18-39 years of age. Every year if you are 40 years old or older. Eat a healthy diet  Eat a diet that includes plenty of vegetables, fruits, low-fat dairy products, and lean protein. Do not eat a lot of foods that are high in solid fats, added sugars, or sodium. Get regular exercise Get regular exercise. This is one of the most important things you can do for your health. Most adults should: Try to exercise for at least 150 minutes each week. The exercise should increase your heart rate and make you sweat (moderate-intensity exercise). Try to do strengthening exercises at least twice each week. Do these in addition to the moderate-intensity exercise. Spend less time sitting. Even light physical activity can be beneficial. Other tips Work with your health care provider to achieve or maintain a healthy weight. Do not use any products that contain nicotine or tobacco. These products include cigarettes, chewing tobacco, and vaping devices, such as e-cigarettes. If you need help quitting, ask your health care provider. Know your numbers. Ask your health care provider to check your cholesterol and your blood sugar (glucose). Continue to have your blood tested as directed by your health care provider. Do I need screening for cancer? Depending on your health history and family history, you may need to have cancer screenings at different stages of your life. This may include screening for: Breast cancer. Cervical cancer. Lung cancer. Colorectal cancer. What is my risk for osteoporosis? After menopause, you may be   at increased risk for osteoporosis. Osteoporosis is a condition in which bone destruction happens more quickly than new bone creation. To help prevent osteoporosis or  the bone fractures that can happen because of osteoporosis, you may take the following actions: If you are 19-50 years old, get at least 1,000 mg of calcium and at least 600 international units (IU) of vitamin D per day. If you are older than age 50 but younger than age 70, get at least 1,200 mg of calcium and at least 600 international units (IU) of vitamin D per day. If you are older than age 70, get at least 1,200 mg of calcium and at least 800 international units (IU) of vitamin D per day. Smoking and drinking excessive alcohol increase the risk of osteoporosis. Eat foods that are rich in calcium and vitamin D, and do weight-bearing exercises several times each week as directed by your health care provider. How does menopause affect my mental health? Depression may occur at any age, but it is more common as you become older. Common symptoms of depression include: Feeling depressed. Changes in sleep patterns. Changes in appetite or eating patterns. Feeling an overall lack of motivation or enjoyment of activities that you previously enjoyed. Frequent crying spells. Talk with your health care provider if you think that you are experiencing any of these symptoms. General instructions See your health care provider for regular wellness exams and vaccines. This may include: Scheduling regular health, dental, and eye exams. Getting and maintaining your vaccines. These include: Influenza vaccine. Get this vaccine each year before the flu season begins. Pneumonia vaccine. Shingles vaccine. Tetanus, diphtheria, and pertussis (Tdap) booster vaccine. Your health care provider may also recommend other immunizations. Tell your health care provider if you have ever been abused or do not feel safe at home. Summary Menopause is a normal process in which your ability to get pregnant comes to an end. This condition causes hot flashes, night sweats, decreased interest in sex, mood swings, headaches, or lack  of sleep. Treatment for this condition may include hormone replacement therapy. Take actions to keep yourself healthy, including exercising regularly, eating a healthy diet, watching your weight, and checking your blood pressure and blood sugar levels. Get screened for cancer and depression. Make sure that you are up to date with all your vaccines. This information is not intended to replace advice given to you by your health care provider. Make sure you discuss any questions you have with your health care provider. Document Revised: 09/22/2020 Document Reviewed: 09/22/2020 Elsevier Patient Education  2024 Elsevier Inc.  

## 2022-12-02 NOTE — Addendum Note (Signed)
Addended by: Kavin Leech E on: 12/02/2022 02:54 PM   Modules accepted: Orders

## 2022-12-03 ENCOUNTER — Telehealth: Payer: Self-pay | Admitting: Internal Medicine

## 2022-12-03 DIAGNOSIS — N1832 Chronic kidney disease, stage 3b: Secondary | ICD-10-CM

## 2022-12-03 LAB — CBC
HCT: 35 % (ref 35.0–45.0)
Hemoglobin: 11.3 g/dL — ABNORMAL LOW (ref 11.7–15.5)
MCH: 29.4 pg (ref 27.0–33.0)
MCHC: 32.3 g/dL (ref 32.0–36.0)
MCV: 90.9 fL (ref 80.0–100.0)
MPV: 9.8 fL (ref 7.5–12.5)
Platelets: 247 10*3/uL (ref 140–400)
RBC: 3.85 10*6/uL (ref 3.80–5.10)
RDW: 13.6 % (ref 11.0–15.0)
WBC: 5.4 10*3/uL (ref 3.8–10.8)

## 2022-12-03 LAB — COMPLETE METABOLIC PANEL WITH GFR
AG Ratio: 1.5 (calc) (ref 1.0–2.5)
ALT: 14 U/L (ref 6–29)
AST: 16 U/L (ref 10–35)
Albumin: 4 g/dL (ref 3.6–5.1)
Alkaline phosphatase (APISO): 86 U/L (ref 37–153)
BUN/Creatinine Ratio: 20 (calc) (ref 6–22)
BUN: 25 mg/dL (ref 7–25)
CO2: 32 mmol/L (ref 20–32)
Calcium: 10.1 mg/dL (ref 8.6–10.4)
Chloride: 99 mmol/L (ref 98–110)
Creat: 1.27 mg/dL — ABNORMAL HIGH (ref 0.60–1.00)
Globulin: 2.6 g/dL (calc) (ref 1.9–3.7)
Glucose, Bld: 84 mg/dL (ref 65–139)
Potassium: 5.2 mmol/L (ref 3.5–5.3)
Sodium: 138 mmol/L (ref 135–146)
Total Bilirubin: 0.3 mg/dL (ref 0.2–1.2)
Total Protein: 6.6 g/dL (ref 6.1–8.1)
eGFR: 44 mL/min/{1.73_m2} — ABNORMAL LOW (ref >=60)

## 2022-12-03 LAB — HEMOGLOBIN A1C
Hgb A1c MFr Bld: 5.8 % of total Hgb — ABNORMAL HIGH (ref <5.7)
Mean Plasma Glucose: 120 mg/dL
eAG (mmol/L): 6.6 mmol/L

## 2022-12-03 LAB — LIPID PANEL
Cholesterol: 195 mg/dL (ref <200)
HDL: 63 mg/dL (ref >=50)
LDL Cholesterol (Calc): 106 mg/dL (calc) — ABNORMAL HIGH
Non-HDL Cholesterol (Calc): 132 mg/dL (calc) — ABNORMAL HIGH (ref <130)
Total CHOL/HDL Ratio: 3.1 (calc) (ref <5.0)
Triglycerides: 148 mg/dL (ref <150)

## 2022-12-03 NOTE — Telephone Encounter (Signed)
Pt called in, has lab results but has question about them, mentioned about her kidneys. Please call back, when notes are available, no notes are showing on results

## 2022-12-03 NOTE — Telephone Encounter (Signed)
Patient aware of results and recommendations. °

## 2022-12-03 NOTE — Telephone Encounter (Signed)
Blood counts are stable. Liver function is normal. Kidney function decreased but fairly stable. Would she be willing to see a kidney specialist for further evaluation of her decreased kidney function. She should avoid NSAID's OTC and drink 48-64 oz of water daily. Cholesterol slightly elevated. A1C shows prediabetes. Consume a low saturated fat, low carb diet.

## 2022-12-03 NOTE — Addendum Note (Signed)
Addended by: Judd Gaudier on: 12/03/2022 03:35 PM   Modules accepted: Orders

## 2022-12-08 DIAGNOSIS — N2581 Secondary hyperparathyroidism of renal origin: Secondary | ICD-10-CM | POA: Diagnosis not present

## 2022-12-08 DIAGNOSIS — N1832 Chronic kidney disease, stage 3b: Secondary | ICD-10-CM | POA: Diagnosis not present

## 2022-12-08 DIAGNOSIS — D631 Anemia in chronic kidney disease: Secondary | ICD-10-CM | POA: Diagnosis not present

## 2022-12-08 DIAGNOSIS — I10 Essential (primary) hypertension: Secondary | ICD-10-CM | POA: Diagnosis not present

## 2022-12-24 DIAGNOSIS — Z961 Presence of intraocular lens: Secondary | ICD-10-CM | POA: Diagnosis not present

## 2022-12-24 DIAGNOSIS — H353131 Nonexudative age-related macular degeneration, bilateral, early dry stage: Secondary | ICD-10-CM | POA: Diagnosis not present

## 2022-12-24 DIAGNOSIS — H43813 Vitreous degeneration, bilateral: Secondary | ICD-10-CM | POA: Diagnosis not present

## 2022-12-30 ENCOUNTER — Other Ambulatory Visit: Payer: Self-pay | Admitting: Internal Medicine

## 2022-12-30 NOTE — Telephone Encounter (Signed)
Requested medication (s) are due for refill today- yes  Requested medication (s) are on the active medication list -yes  Future visit scheduled -yes  Last refill: 10/18/22 3 each  Notes to clinic: off protocol- provider review   Requested Prescriptions  Pending Prescriptions Disp Refills   BREZTRI AEROSPHERE 160-9-4.8 MCG/ACT AERO [Pharmacy Med Name: BREZTRI AEROSPHERE 160-9-4.8 MCG/ACT Aerosol] 3 each 3    Sig: INHALE 2 PUFFS TWICE DAILY     Off-Protocol Failed - 12/30/2022  2:53 AM      Failed - Medication not assigned to a protocol, review manually.      Passed - Valid encounter within last 12 months    Recent Outpatient Visits           4 weeks ago Encounter for general adult medical examination with abnormal findings   [facility] [place], Salvadore Oxford, NP   4 months ago Primary hypertension   [place] [facility] [place], Salvadore Oxford, NP   5 months ago Primary hypertension   [place] [facility] [place], Salvadore Oxford, NP   7 months ago Age-related osteoporosis without current pathological fracture    [place] [facility] [place], Salvadore Oxford, NP   7 months ago Dark urine   [place] [facility] [place], Salvadore Oxford, NP       Future Appointments             In 5 months Sam[place] Si, Salvadore Oxford, NP [place] [facility], [facility]               Requested Prescriptions  Pending Prescriptions Disp Refills   BREZTRI AEROSPHERE 160-9-4.8 MCG/ACT Genevie Ann Med Name: BREZTRI AEROSPHERE 160-9-4.8 MCG/ACT Aerosol] 3 each 3    Sig: INHALE 2 PUFFS TWICE DAILY     Off-Protocol Failed - 12/30/2022  2:53 AM      Failed - Medication not assigned to a protocol, review manually.      Passed - Valid encounter within last 12 months    Recent Outpatient Visits           4 weeks ago Encounter for general adult medical examination with abnormal findings   [facility] [place], Salvadore Oxford, NP   4 months ago Primary hypertension   [place] [facility] [place], Salvadore Oxford, NP   5 months ago Primary hypertension   [place] [facility] [place], Salvadore Oxford, NP   7 months ago Age-related osteoporosis without current pathological fracture   [place] [facility] [place], Salvadore Oxford, NP   7 months ago Dark urine   [place] [facility] [place], Salvadore Oxford, NP       Future Appointments             In 5 months Baity, Salvadore Oxford, NP [place] [facility], [facility]

## 2023-01-07 ENCOUNTER — Encounter: Payer: Self-pay | Admitting: Internal Medicine

## 2023-01-18 ENCOUNTER — Other Ambulatory Visit: Payer: Self-pay | Admitting: Internal Medicine

## 2023-01-18 DIAGNOSIS — F419 Anxiety disorder, unspecified: Secondary | ICD-10-CM

## 2023-01-18 DIAGNOSIS — R251 Tremor, unspecified: Secondary | ICD-10-CM

## 2023-01-19 ENCOUNTER — Ambulatory Visit: Payer: Self-pay

## 2023-01-19 NOTE — Telephone Encounter (Signed)
Advised pt that Rene Kocher is not in the office on Wednesday afternoons.  She agreed to an appointment tomorrow at 2pm.  (01/20/2023)   Thank you,   -Vernona Rieger

## 2023-01-19 NOTE — Telephone Encounter (Signed)
    Chief Complaint: Larey Seat at home this morning, fell forward on chest. Has left side rib pain. Asking to be worked in today after 3:00. No availability. Symptoms: Pain Frequency: Today Pertinent Negatives: Patient denies any lacerations. Disposition: [] ED /[] Urgent Care (no appt availability in office) / [] Appointment(In office/virtual)/ []  [facility]/ [] Home Care/ [] Refused Recommended Disposition /[] [place] [facility]/ [x]  Follow-up with PCP Additional Notes: Please advise pt.  Reason for Disposition  MILD weakness (i.e., does not interfere with ability to work, go to school, normal activities)  (Exception: Mild weakness is a chronic symptom.)  Answer Assessment - Initial Assessment Questions 1. MECHANISM: "How did the fall happen?"     Tripped on rug 2. DOMESTIC VIOLENCE AND ELDER ABUSE SCREENING: "Did you fall because someone pushed you or tried to hurt you?" If Yes, ask: "Are you safe now?"     No 3. ONSET: "When did the fall happen?" (e.g., minutes, hours, or days ago)     Today 4. LOCATION: "What part of the body hit the ground?" (e.g., back, buttocks, head, hips, knees, hands, head, stomach)     Front and breast on left 5. INJURY: "Did you hurt (injure) yourself when you fell?" If Yes, ask: "What did you injure? Tell me more about this?" (e.g., body area; type of injury; pain severity)"     Yes 6. PAIN: "Is there any pain?" If Yes, ask: "How bad is the pain?" (e.g., Scale 1-10; or mild,  moderate, severe)   - NONE (0): No pain   - MILD (1-3): Doesn't interfere with normal activities    - MODERATE (4-7): Interferes with normal activities or awakens from sleep    - SEVERE (8-10): Excruciating pain, unable to do any normal activities      5 7. SIZE: For cuts, bruises, or swelling, ask: "How large is it?" (e.g., inches or centimeters)      No 8. PREGNANCY: "Is there any chance you are pregnant?" "When was your last menstrual period?"     No 9. OTHER SYMPTOMS:  "Do you have any other symptoms?" (e.g., dizziness, fever, weakness; new onset or worsening).      No 10. CAUSE: "What do you think caused the fall (or falling)?" (e.g., tripped, dizzy spell)       Tripped  Protocols used: Falls and [facility]

## 2023-01-20 ENCOUNTER — Ambulatory Visit
Admission: RE | Admit: 2023-01-20 | Discharge: 2023-01-20 | Disposition: A | Discharge status: Home / Self Care | Payer: Medicare HMO | Source: Ambulatory Visit | Attending: Internal Medicine | Admitting: Internal Medicine

## 2023-01-20 ENCOUNTER — Ambulatory Visit (INDEPENDENT_AMBULATORY_CARE_PROVIDER_SITE_OTHER): Payer: Medicare HMO | Admitting: Internal Medicine

## 2023-01-20 ENCOUNTER — Encounter: Payer: Self-pay | Admitting: Internal Medicine

## 2023-01-20 ENCOUNTER — Ambulatory Visit
Admission: RE | Admit: 2023-01-20 | Discharge: 2023-01-20 | Disposition: A | Discharge status: Home / Self Care | Payer: Medicare HMO | Attending: Internal Medicine | Admitting: Internal Medicine

## 2023-01-20 VITALS — BP 114/82 | HR 97 | Temp 96.2°F | Wt 225.0 lb

## 2023-01-20 DIAGNOSIS — W010XXA Fall on same level from slipping, tripping and stumbling without subsequent striking against object, initial encounter: Secondary | ICD-10-CM

## 2023-01-20 DIAGNOSIS — R079 Chest pain, unspecified: Secondary | ICD-10-CM | POA: Diagnosis not present

## 2023-01-20 DIAGNOSIS — N644 Mastodynia: Secondary | ICD-10-CM | POA: Diagnosis not present

## 2023-01-20 DIAGNOSIS — R0781 Pleurodynia: Secondary | ICD-10-CM | POA: Insufficient documentation

## 2023-01-20 MED ORDER — TIZANIDINE HCL 2 MG PO CAPS: 2.0000 mg | ORAL_CAPSULE | Freq: Every evening | ORAL | 0 refills | Status: DC | PRN

## 2023-01-20 MED ORDER — LIDOCAINE 5 % EX PTCH: 1.0000 | MEDICATED_PATCH | CUTANEOUS | 0 refills | Status: DC

## 2023-01-20 NOTE — Progress Notes (Signed)
Subjective:    Patient ID: Kristie Byrd, female    DOB: November 11, 1945, 77 y.o.   MRN: [id number]  HPI  Patient presents the clinic today with complaint of bilateral rib pain.  This started after a fall that occurred yesterday.  She reports she tripped, fell forward, landing on her chest.  She did not hit her head or lose consciousness.  She describes the pain as sharp and stabbing.  The pain is worse with taking a deep breath and certain movements.  She has taken Tylenol OTC with minimal relief of symptoms.  Review of Systems  Past Medical History:  Diagnosis Date   Arthritis    CKD (chronic kidney disease), stage III (HCC)    Colon polyps    COPD (chronic obstructive pulmonary disease) (HCC)    Hypertension    Iron deficiency anemia 09/27/2012   PONV (postoperative nausea and vomiting)    Shingles    Sleep apnea    starting CPAP in 2024   Uses walker    or cane   Wears dentures    full upper and lower   Wears hearing aid in right ear     Current Outpatient Medications  Medication Sig Dispense Refill   acetaminophen (TYLENOL) 650 MG CR tablet Take 1,000 mg by mouth every 4 (four) hours as needed.     albuterol (PROVENTIL) (2.5 MG/3ML) 0.083% nebulizer solution Take 3 mLs (2.5 mg total) by nebulization every 6 (six) hours as needed for wheezing or shortness of breath. 150 mL 1   Budeson-Glycopyrrol-Formoterol (BREZTRI AEROSPHERE) 160-9-4.8 MCG/ACT AERO INHALE 2 PUFFS TWICE DAILY 3 each 1   buPROPion (WELLBUTRIN XL) 300 MG 24 hr tablet TAKE 1 TABLET EVERY DAY 90 tablet 1   calcitRIOL (ROCALTROL) 0.25 MCG capsule Take 0.25 mcg by mouth daily.     Cholecalciferol (VITAMIN D3) 1000 units CAPS Take 1,000 Units by mouth daily.     ferrous sulfate 325 (65 FE) MG EC tablet Take 325 mg by mouth 3 (three) times daily with meals.     fluticasone (FLONASE) 50 MCG/ACT nasal spray Place 1 spray into both nostrils as needed for allergies or rhinitis.     lisinopril (ZESTRIL) 20 MG tablet Take  1 tablet (20 mg total) by mouth daily. 90 tablet 1   Multiple Vitamin (MULTIVITAMIN) tablet Take 1 tablet by mouth at bedtime.      propranolol (INDERAL) 40 MG tablet TAKE 1 TABLET TWICE DAILY 180 tablet 1   sertraline (ZOLOFT) 100 MG tablet TAKE 1 TABLET EVERY DAY 90 tablet 1   No current facility-administered medications for this visit.    Allergies  Allergen Reactions   Codeine Nausea And Vomiting   Demerol [Meperidine] Nausea And Vomiting   Fleet Phospho-Soda [Sodium Phosphates] Nausea And Vomiting    Vomiting    Latex Rash    Blisters   Other Other (See Comments)    Band-Aid, blister   Prednisone Other (See Comments)    Chest pressure    Family History  Problem Relation Age of Onset   Colon cancer Daughter     Social History   Socioeconomic History   Marital status: Divorced    Spouse name: Not on file   Number of children: 2   Years of education: Not on file   Highest education level: Not on file  Occupational History   Occupation: retired  Tobacco Use   Smoking status: Former    Current packs/day: 0.00    Average packs/day:  1 pack/day for 37.0 years (37.0 ttl pk-yrs)    Types: Cigarettes    Start date: 11/16/1959    Quit date: 11/15/1996    Years since quitting: 26.1   Smokeless tobacco: Never  Vaping Use   Vaping status: Never Used  Substance and Sexual Activity   Alcohol use: No    Alcohol/week: 0.0 standard drinks of alcohol   Drug use: No    Comment: uses CBD oil    Sexual activity: Never  Other Topics Concern   Not on file  Social History Narrative   Lives alone.  Divorced. Two daughters- in 27s, 3 grandchildren.   Does not know family history- raised in [place] [facility] [place].        Desires CPR.  Does not have HPOA.   She would possibly want life support if for short period of time . Unsure about feeding tube.   Social Determinants of Health   Financial Resource Strain: Low Risk  (09/30/2022)   Overall Financial Resource Strain (CARDIA)     Difficulty of Paying Living Expenses: Not hard at all  Food Insecurity: No Food Insecurity (09/30/2022)   Hunger Vital Sign    Worried About Running Out of Food in the Last Year: Never true    Ran Out of Food in the Last Year: Never true  Transportation Needs: No Transportation Needs (09/30/2022)   PRAPARE - Administrator, Civil Service (Medical): No    Lack of Transportation (Non-Medical): No  Physical Activity: Inactive (09/30/2022)   Exercise Vital Sign    Days of Exercise per Week: 0 days    Minutes of Exercise per Session: 0 min  Stress: No Stress Concern Present (09/30/2022)   [employer] of Occupational Health - Occupational Stress Questionnaire    Feeling of Stress : Not at all  Social Connections: Socially Isolated (09/30/2022)   Social Connection and Isolation Panel [NHANES]    Frequency of Communication with Friends and Family: More than three times a week    Frequency of Social Gatherings with Friends and Family: Twice a week    Attends Religious Services: Never    Database administrator or Organizations: No    Attends Banker Meetings: Never    Marital Status: Divorced  Catering manager Violence: Not At Risk (09/30/2022)   Humiliation, Afraid, Rape, and Kick questionnaire    Fear of Current or Ex-Partner: No    Emotionally Abused: No    Physically Abused: No    Sexually Abused: No     Constitutional: Denies fever, malaise, fatigue, headache or abrupt weight changes.  Respiratory: Denies difficulty breathing, shortness of breath, cough or sputum production.   Cardiovascular: Denies chest pain, chest tightness, palpitations or swelling in the hands or feet.  Gastrointestinal: Denies abdominal pain, bloating, constipation, diarrhea or blood in the stool.  GU: Denies urgency, frequency, pain with urination, burning sensation, blood in urine, odor or discharge. Musculoskeletal: Patient reports bilateral rib pain, chronic joint pain.  Denies  decrease in range of motion, difficulty with gait, muscle pain or joint swelling.  Skin: Patient reports abrasion to left hand.  Denies rashes, lesions or ulcercations.  Neurological: Patient reports tremor.  Denies dizziness, difficulty with memory, difficulty with speech or problems with balance and coordination.  P No other specific complaints in a complete review of systems (except as listed in HPI above).     Objective:   Physical Exam  BP 114/82 (BP Location: Left Arm, Patient Position:  Sitting, Cuff Size: Normal)   Pulse 97   Temp (!) 96.2 F (35.7 C) (Temporal)   Wt 225 lb (102.1 kg)   SpO2 97%   BMI 37.44 kg/m   Wt Readings from Last 3 Encounters:  12/02/22 239 lb (108.4 kg)  10/22/22 238 lb (108 kg)  09/30/22 239 lb 6.4 oz (108.6 kg)    General: Appears her stated age, obese, in NAD. Skin: Bruising noted to central upper lip.  Abrasion noted to posterior left hand. HEENT: Head: normal shape and size; Eyes: sclera white, no icterus, conjunctiva pink, PERRLA and EOMs intact;  Cardiovascular: Normal rate and rhythm.  Pulmonary/Chest: Normal effort and positive vesicular breath sounds. No respiratory distress. No wheezes, rales or ronchi noted.  Abdomen: Soft and nontender.  Musculoskeletal: Pain with palpation of bilateral anterior lower ribs.  No deformity noted.  Gait slow and steady with use of rolling walker. Neurological: Alert and oriented.   BMET    Component Value Date/Time   NA 138 12/02/2022 1451   NA 137 02/25/2014 1404   K 5.2 12/02/2022 1451   K 4.2 02/25/2014 1404   CL 99 12/02/2022 1451   CL 102 02/25/2014 1404   CO2 32 12/02/2022 1451   CO2 28 02/25/2014 1404   GLUCOSE 84 12/02/2022 1451   GLUCOSE 124 (H) 02/25/2014 1404   BUN 25 12/02/2022 1451   BUN 20 02/10/2017 0000   BUN 24 (H) 02/25/2014 1404   CREATININE 1.27 (H) 12/02/2022 1451   CALCIUM 10.1 12/02/2022 1451   CALCIUM 9.7 04/06/2021 1322   GFRNONAA 47 (L) 10/22/2022 1406   GFRNONAA  44 (L) 02/25/2014 1404   GFRNONAA 45 (L) 10/26/2013 1330   GFRAA 56 (L) 10/03/2019 1324   GFRAA 54 (L) 02/25/2014 1404   GFRAA 52 (L) 10/26/2013 1330    Lipid Panel     Component Value Date/Time   CHOL 195 12/02/2022 1451   TRIG 148 12/02/2022 1451   HDL 63 12/02/2022 1451   CHOLHDL 3.1 12/02/2022 1451   VLDL 37 04/02/2020 1242   LDLCALC 106 (H) 12/02/2022 1451    CBC    Component Value Date/Time   WBC 5.4 12/02/2022 1451   RBC 3.85 12/02/2022 1451   HGB 11.3 (L) 12/02/2022 1451   HGB 11.4 (L) 02/25/2014 1404   HCT 35.0 12/02/2022 1451   HCT 34.9 (L) 02/25/2014 1404   PLT 247 12/02/2022 1451   PLT 256 02/25/2014 1404   MCV 90.9 12/02/2022 1451   MCV 89 02/25/2014 1404   MCH 29.4 12/02/2022 1451   MCHC 32.3 12/02/2022 1451   RDW 13.6 12/02/2022 1451   RDW 14.7 (H) 02/25/2014 1404   LYMPHSABS 1.0 10/22/2022 1406   LYMPHSABS 1.1 02/25/2014 1404   MONOABS 0.7 10/22/2022 1406   MONOABS 0.5 02/25/2014 1404   EOSABS 0.1 10/22/2022 1406   EOSABS 0.0 02/25/2014 1404   BASOSABS 0.0 10/22/2022 1406   BASOSABS 0.1 02/25/2014 1404    Hgb A1C Lab Results  Component Value Date   HGBA1C 5.8 (H) 12/02/2022           Assessment & Plan:   Bile normal pain status post fall:  X-ray bilateral ribs today per her request although we discussed that this would not change our treatment plan Rx for Lidoderm 5%-wear during the day and take off at night Continue Tylenol 1000 mg every 8 hours Rx for Zanaflex 2 mg nightly as needed-today she comes given She is requesting a rib binder however  I advised her we do not use rib binders anymore as this increases incidence of pneumonia  RTC in 4 months for follow-up of chronic conditions Nicki Reaper, NP

## 2023-01-20 NOTE — Telephone Encounter (Signed)
Requested Prescriptions  Pending Prescriptions Disp Refills   propranolol (INDERAL) 40 MG tablet [Pharmacy Med Name: Propranolol HCl Oral Tablet 40 MG] 180 tablet 1    Sig: TAKE 1 TABLET TWICE DAILY     Cardiovascular:  Beta Blockers Passed - 01/18/2023 10:27 AM      Passed - Last BP in normal range    BP Readings from Last 1 Encounters:  12/02/22 122/68         Passed - Last Heart Rate in normal range    Pulse Readings from Last 1 Encounters:  12/02/22 68         Passed - Valid encounter within last 6 months    Recent Outpatient Visits           1 month ago Encounter for general adult medical examination with abnormal findings   [facility] [place], Salvadore Oxford, NP   5 months ago Primary hypertension   [place] [place], Salvadore Oxford, NP   5 months ago Primary hypertension   [place] [facility] [place], Salvadore Oxford, NP   7 months ago Age-related osteoporosis without current pathological fracture   [place] [facility] [place], Salvadore Oxford, NP   8 months ago Dark urine   [place] [facility] [place], Salvadore Oxford, NP       Future Appointments             Today Sampson Si, Salvadore Oxford, NP [facility], [facility]   In 4 months [place], Salvadore Oxford, NP [facility], [facility]             sertraline (ZOLOFT) 100 MG tablet [Pharmacy Med Name: Sertraline HCl Oral Tablet 100 MG] 90 tablet 1    Sig: TAKE 1 TABLET EVERY DAY     Psychiatry:  Antidepressants - SSRI - sertraline Passed - 01/18/2023 10:27 AM      Passed - AST in normal range and within 360 days    AST  Date Value Ref Range Status  12/02/2022 16 10 - 35 U/L Final   SGOT(AST)  Date Value Ref Range Status  02/25/2014 15 15 - 37 Unit/L Final         Passed - ALT in normal range and within 360 days    ALT  Date Value Ref Range Status  12/02/2022 14 6 - 29 U/L Final   SGPT (ALT)  Date Value  Ref Range Status  02/25/2014 23 U/L Final    Comment:    14-63 NOTE: New Reference Range 12/04/13          Passed - Completed PHQ-2 or PHQ-9 in the last 360 days      Passed - Valid encounter within last 6 months    Recent Outpatient Visits           1 month ago Encounter for general adult medical examination with abnormal findings   [place] [facility] [place], Salvadore Oxford, NP   5 months ago Primary hypertension   [place] [facility] [place], Salvadore Oxford, NP   5 months ago Primary hypertension    [facility] [place], Salvadore Oxford, NP   7 months ago Age-related osteoporosis without current pathological fracture    [facility] [place], Salvadore Oxford, NP   8 months ago Dark urine   [facility] [place], Salvadore Oxford, NP       Future Appointments             Today Lorre Munroe, NP [facility], [facility]   In 4 months [place], Salvadore Oxford, NP [facility], [facility]             buPROPion (WELLBUTRIN XL) 300 MG 24 hr tablet [Pharmacy Med Name: buPROPion HCl ER (XL) Oral Tablet Extended Release 24 Hour 300 MG] 90 tablet 1    Sig: TAKE 1 TABLET EVERY DAY     Psychiatry: Antidepressants - bupropion Failed - 01/18/2023 10:27 AM      Failed - Cr in normal range and within 360 days    Creat  Date Value Ref Range Status  12/02/2022 1.27 (H) 0.60 - 1.00 mg/dL Final         Passed - AST in normal range and within 360 days    AST  Date Value Ref Range Status  12/02/2022 16 10 - 35 U/L Final   SGOT(AST)  Date Value Ref Range Status  02/25/2014 15 15 - 37 Unit/L Final         Passed - ALT in normal range and within 360 days    ALT  Date Value Ref Range Status  12/02/2022 14 6 - 29 U/L Final   SGPT (ALT)  Date Value Ref Range Status  02/25/2014 23 U/L Final    Comment:    14-63 NOTE: New Reference Range 12/04/13          Passed -  Completed PHQ-2 or PHQ-9 in the last 360 days      Passed - Last BP in normal range    BP Readings from Last 1 Encounters:  12/02/22 122/68         Passed - Valid encounter within last 6 months    Recent Outpatient Visits           1 month ago Encounter for general adult medical examination with abnormal findings   [facility] [place], Salvadore Oxford, NP   5 months ago Primary hypertension   [facility] [place], Salvadore Oxford, NP   5 months ago Primary hypertension   [place] [facility] [place], [place], NP   7 months ago Age-related osteoporosis without current pathological fracture   [place] [facility] [place], Salvadore Oxford, NP   8 months ago Dark urine   [place] [facility] [place], Salvadore Oxford, NP       Future Appointments             Today Sampson Si, Salvadore Oxford, NP [facility], [facility]   In 4 months [place], Salvadore Oxford, NP [facility] [place]

## 2023-01-20 NOTE — Patient Instructions (Signed)
Rib Contusion A rib contusion is a deep bruise on the rib area. Contusions are the result of a blunt trauma that causes bleeding and injury to the tissues under the skin. A rib contusion may involve bruising of the ribs and of the skin and muscles in the area. The skin over the contusion may turn blue, purple, or yellow. Minor injuries result in a painless contusion. More severe contusions may be painful and swollen for a few weeks. What are the causes? This condition is usually caused by a hard, direct hit to an area of the body. This often occurs while playing contact sports. What are the signs or symptoms? Symptoms of this condition include: Swelling and redness of the injured area. Discoloration of the injured area. Tenderness and soreness of the injured area. Pain with or without movement. Pain when breathing in. How is this diagnosed? This condition may be diagnosed based on: Your symptoms and medical history. A physical exam. Imaging tests--such as an X-ray, CT scan, or MRI--to determine if there were internal injuries or broken bones (fractures). How is this treated? This condition may be treated with: Rest. This is often the best treatment for a rib contusion. Ice packs. This reduces swelling and inflammation. Deep-breathing exercises. These may be recommended to reduce the risk for lung collapse and pneumonia. Medicines. Over-the-counter or prescription medicines may be given to control pain. Injection of a numbing medicine around the nerve near your injury (nerve block). Follow these instructions at home: Medicines Take over-the-counter and prescription medicines only as told by your health care provider. Ask your health care provider if the medicine prescribed to you: Requires you to avoid driving or using machinery. Can cause constipation. You may need to take these actions to prevent or treat constipation: Drink enough fluid to keep your urine pale yellow. Take  over-the-counter or prescription medicines. Eat foods that are high in fiber, such as beans, whole grains, and fresh fruits and vegetables. Limit foods that are high in fat and processed sugars, such as fried or sweet foods. Managing pain, stiffness, and swelling If directed, put ice on the injured area. To do this: Put ice in a plastic bag. Place a towel between your skin and the bag. Leave the ice on for 20 minutes, 2-3 times a day. Remove the ice if your skin turns bright red. This is very important. If you cannot feel pain, heat, or cold, you have a greater risk of damage to the area.  Activity Rest the injured area. Avoid strenuous activity and any activities or movements that cause pain. Be careful during activities, and avoid bumping the injured area. Do not lift anything that is heavier than 5 lb (2.3 kg), or the limit that you are told, until your health care provider says that it is safe. General instructions  Do not use any products that contain nicotine or tobacco, such as cigarettes, e-cigarettes, and chewing tobacco. These can delay healing. If you need help quitting, ask your health care provider. Do deep-breathing exercises as told by your health care provider. If you were given an incentive spirometer, use it every 1-2 hours while you are awake, or as recommended by your health care provider. This device measures how well you are filling your lungs with each breath. Keep all follow-up visits. This is important. Contact a health care provider if you have: Increased bruising or swelling. Pain that is not controlled with treatment. A fever. Get help right away if you: Have difficulty breathing or  shortness of breath. Develop a continual cough, or you cough up thick or bloody mucus from your lungs (sputum). Feel nauseous or you vomit. Have pain in your abdomen. These symptoms may represent a serious problem that is an emergency. Do not wait to see if the symptoms will go  away. Get medical help right away. Call your local emergency services (911 in the [place] Do not drive yourself to the hospital. Summary A rib contusion is a deep bruise on your rib area. Contusions are the result of a blunt trauma that causes bleeding and injury to the tissues under the skin. The skin over the contusion may turn blue, purple, or yellow. Minor injuries may cause a painless contusion. More severe contusions may be painful and swollen for a few weeks. Rest the injured area. Avoid strenuous activity and any activities or movements that cause pain. This information is not intended to replace advice given to you by your health care provider. Make sure you discuss any questions you have with your health care provider. Document Revised: 08/08/2019 Document Reviewed: 08/08/2019 Elsevier Patient Education  2024 [employer].

## 2023-01-26 ENCOUNTER — Ambulatory Visit: Payer: Medicare HMO | Admitting: Internal Medicine

## 2023-01-26 ENCOUNTER — Inpatient Hospital Stay: Payer: Medicare HMO

## 2023-01-26 ENCOUNTER — Other Ambulatory Visit: Payer: Medicare HMO

## 2023-01-26 ENCOUNTER — Ambulatory Visit: Payer: Medicare HMO

## 2023-01-26 ENCOUNTER — Inpatient Hospital Stay: Payer: Medicare HMO | Attending: Internal Medicine

## 2023-01-26 ENCOUNTER — Encounter: Payer: Self-pay | Admitting: Internal Medicine

## 2023-01-26 ENCOUNTER — Inpatient Hospital Stay (HOSPITAL_BASED_OUTPATIENT_CLINIC_OR_DEPARTMENT_OTHER): Payer: Medicare HMO | Admitting: Internal Medicine

## 2023-01-26 VITALS — BP 121/70 | HR 63 | Temp 97.6°F | Ht 65.0 in | Wt 226.0 lb

## 2023-01-26 DIAGNOSIS — D631 Anemia in chronic kidney disease: Secondary | ICD-10-CM | POA: Diagnosis not present

## 2023-01-26 DIAGNOSIS — N183 Chronic kidney disease, stage 3 unspecified: Secondary | ICD-10-CM

## 2023-01-26 DIAGNOSIS — Z79899 Other long term (current) drug therapy: Secondary | ICD-10-CM | POA: Insufficient documentation

## 2023-01-26 LAB — BASIC METABOLIC PANEL - CANCER CENTER ONLY
Anion gap: 7 (ref 5–15)
BUN: 36 mg/dL — ABNORMAL HIGH (ref 8–23)
CO2: 25 mmol/L (ref 22–32)
Calcium: 9.5 mg/dL (ref 8.9–10.3)
Chloride: 101 mmol/L (ref 98–111)
Creatinine: 1.28 mg/dL — ABNORMAL HIGH (ref 0.44–1.00)
GFR, Estimated: 43 mL/min — ABNORMAL LOW (ref >60)
Glucose, Bld: 101 mg/dL — ABNORMAL HIGH (ref 70–99)
Potassium: 4.7 mmol/L (ref 3.5–5.1)
Sodium: 133 mmol/L — ABNORMAL LOW (ref 135–145)

## 2023-01-26 LAB — CBC WITH DIFFERENTIAL (CANCER CENTER ONLY)
Abs Immature Granulocytes: 0.01 10*3/uL (ref 0.00–0.07)
Basophils Absolute: 0 10*3/uL (ref 0.0–0.1)
Basophils Relative: 1 %
Eosinophils Absolute: 0 10*3/uL (ref 0.0–0.5)
Eosinophils Relative: 0 %
HCT: 36.6 % (ref 36.0–46.0)
Hemoglobin: 11.8 g/dL — ABNORMAL LOW (ref 12.0–15.0)
Immature Granulocytes: 0 %
Lymphocytes Relative: 20 %
Lymphs Abs: 1 10*3/uL (ref 0.7–4.0)
MCH: 30 pg (ref 26.0–34.0)
MCHC: 32.2 g/dL (ref 30.0–36.0)
MCV: 93.1 fL (ref 80.0–100.0)
Monocytes Absolute: 0.5 10*3/uL (ref 0.1–1.0)
Monocytes Relative: 11 %
Neutro Abs: 3.4 10*3/uL (ref 1.7–7.7)
Neutrophils Relative %: 68 %
Platelet Count: 249 10*3/uL (ref 150–400)
RBC: 3.93 MIL/uL (ref 3.87–5.11)
RDW: 13.2 % (ref 11.5–15.5)
WBC Count: 4.9 10*3/uL (ref 4.0–10.5)
nRBC: 0 % (ref 0.0–0.2)

## 2023-01-26 LAB — IRON AND TIBC
Iron: 73 ug/dL (ref 28–170)
Saturation Ratios: 21 % (ref 10.4–31.8)
TIBC: 344 ug/dL (ref 250–450)
UIBC: 271 ug/dL

## 2023-01-26 LAB — FERRITIN: Ferritin: 28 ng/mL (ref 11–307)

## 2023-01-26 NOTE — Progress Notes (Signed)
Patient had a DG on 01/20/2023 due to a fractured rib from falling.

## 2023-01-26 NOTE — Progress Notes (Signed)
[facility] CONSULT NOTE  Patient Care Team: Lorre Munroe, NP as PCP - General (Internal Medicine) Lupita Leash, MD (Inactive) as Consulting Physician (Pulmonary Disease) Johney Maine, MD as Consulting Physician (Oncology) Iva Boop, MD as Consulting Physician (Gastroenterology) Lockie Mola, MD as Referring Physician (Ophthalmology) Earna Coder, MD as Consulting Physician (Oncology)  CHIEF COMPLAINTS/PURPOSE OF CONSULTATION: Anemia  # ANEMIA- sec to IDA/CKD [Dr.Lateef-hemoglobin 11.5/iron saturation 13% ferritin 30; intolerant to p.o. iron.]# colonoscopy-~ 2016; [not until 2024]; STOPPED Aurexa [JAN 2023]  # CKD stage III  # Hx of smoking quit  Oncology History   No history exists.   HISTORY OF PRESENTING ILLNESS: Walks with a rolling walker because of back pain.  Alone.  Kristie Byrd 77 y.o.  female longstanding history of chronic kidney disease stage III/ongoing anemia is here for a follow up.  Patient had a DG on 01/20/2023 due to a fractured rib from falling. Patient compliant with her PO iron-no constipation with gentle iron.   Otherwise patient has no concerns.  No nausea no vomiting. No blood in stools or black or stools.  Review of Systems  Constitutional:  Positive for malaise/fatigue. Negative for chills, diaphoresis, fever and weight loss.  HENT:  Negative for nosebleeds and sore throat.   Eyes:  Negative for double vision.  Respiratory:  Negative for cough, hemoptysis, sputum production, shortness of breath and wheezing.   Cardiovascular:  Negative for chest pain, palpitations, orthopnea and leg swelling.  Gastrointestinal:  Positive for constipation. Negative for abdominal pain, blood in stool, diarrhea, heartburn, melena, nausea and vomiting.  Genitourinary:  Negative for dysuria, frequency and urgency.  Musculoskeletal:  Negative for back pain and joint pain.  Skin: Negative.  Negative for itching and rash.   Neurological:  Negative for dizziness, tingling, focal weakness, weakness and headaches.  Endo/Heme/Allergies:  Does not bruise/bleed easily.  Psychiatric/Behavioral:  Negative for depression. The patient is not nervous/anxious and does not have insomnia.      MEDICAL HISTORY:  Past Medical History:  Diagnosis Date   Arthritis    CKD (chronic kidney disease), stage III (HCC)    Colon polyps    COPD (chronic obstructive pulmonary disease) (HCC)    Hypertension    Iron deficiency anemia 09/27/2012   PONV (postoperative nausea and vomiting)    Shingles    Sleep apnea    starting CPAP in 2024   Uses walker    or cane   Wears dentures    full upper and lower   Wears hearing aid in right ear     SURGICAL HISTORY: Past Surgical History:  Procedure Laterality Date   BREAST BIOPSY Bilateral    neg   CARPAL TUNNEL RELEASE Bilateral    CATARACT EXTRACTION W/PHACO Right 05/19/2022   Procedure: CATARACT EXTRACTION PHACO AND INTRAOCULAR LENS PLACEMENT (IOC) RIGHT  5.64  00:50.6;  Surgeon: Lockie Mola, MD;  [facility];  Service: Ophthalmology;  Laterality: Right;  Sleep apnea   CATARACT EXTRACTION W/PHACO Left 06/02/2022   Procedure: CATARACT EXTRACTION PHACO AND INTRAOCULAR LENS PLACEMENT (IOC) LEFT 4.93 52.5;  Surgeon: Lockie Mola, MD;  Location[facility];  Service: Ophthalmology;  Laterality: Left;  sleep apnea   COLONOSCOPY  multiple   KNEE ARTHROPLASTY Right 05/12/2016   Procedure: COMPUTER ASSISTED TOTAL KNEE ARTHROPLASTY;  Surgeon: Donato Heinz, MD;  Location: [facility] OR[facility];  Service: Orthopedics;  Laterality: Right;   KNEE SURGERY Right    NOSE SURGERY  TONSILLECTOMY      SOCIAL HISTORY: Social History   Socioeconomic History   Marital status: Divorced    Spouse name: Not on file   Number of children: 2   Years of education: Not on file   Highest education level: Not on file  Occupational History   Occupation: retired   Tobacco Use   Smoking status: Former    Current packs/day: 0.00    Average packs/day: 1 pack/day for 37.0 years (37.0 ttl pk-yrs)    Types: Cigarettes    Start date: 11/16/1959    Quit date: 11/15/1996    Years since quitting: 26.2   Smokeless tobacco: Never  Vaping Use   Vaping status: Never Used  Substance and Sexual Activity   Alcohol use: No    Alcohol/week: 0.0 standard drinks of alcohol   Drug use: No    Comment: uses CBD oil    Sexual activity: Never  Other Topics Concern   Not on file  Social History Narrative   Lives alone.  Divorced. Two daughters- in 34s, 3 grandchildren.   Does not know family history- raised in [place] [place].        Desires CPR.  Does not have HPOA.   She would possibly want life support if for short period of time . Unsure about feeding tube.   Social Determinants of Health   Financial Resource Strain: Low Risk  (09/30/2022)   Overall Financial Resource Strain (CARDIA)    Difficulty of Paying Living Expenses: Not hard at all  Food Insecurity: No Food Insecurity (09/30/2022)   Hunger Vital Sign    Worried About Running Out of Food in the Last Year: Never true    Ran Out of Food in the Last Year: Never true  Transportation Needs: No Transportation Needs (09/30/2022)   PRAPARE - Administrator, Civil Service (Medical): No    Lack of Transportation (Non-Medical): No  Physical Activity: Inactive (09/30/2022)   Exercise Vital Sign    Days of Exercise per Week: 0 days    Minutes of Exercise per Session: 0 min  Stress: No Stress Concern Present (09/30/2022)   [employer] of Occupational Health - Occupational Stress Questionnaire    Feeling of Stress : Not at all  Social Connections: Socially Isolated (09/30/2022)   Social Connection and Isolation Panel [NHANES]    Frequency of Communication with Friends and Family: More than three times a week    Frequency of Social Gatherings with Friends and Family: Twice a week    Attends  Religious Services: Never    Database administrator or Organizations: No    Attends Banker Meetings: Never    Marital Status: Divorced  Catering manager Violence: Not At Risk (09/30/2022)   Humiliation, Afraid, Rape, and Kick questionnaire    Fear of Current or Ex-Partner: No    Emotionally Abused: No    Physically Abused: No    Sexually Abused: No    FAMILY HISTORY: Family History  Problem Relation Age of Onset   Colon cancer Daughter     ALLERGIES:  is allergic to codeine, demerol [meperidine], fleet phospho-soda [sodium phosphates], latex, other, and prednisone.  MEDICATIONS:  Current Outpatient Medications  Medication Sig Dispense Refill   acetaminophen (TYLENOL) 650 MG CR tablet Take 1,000 mg by mouth every 4 (four) hours as needed.     albuterol (PROVENTIL) (2.5 MG/3ML) 0.083% nebulizer solution Take 3 mLs (2.5 mg total) by nebulization every 6 (six)  hours as needed for wheezing or shortness of breath. 150 mL 1   Budeson-Glycopyrrol-Formoterol (BREZTRI AEROSPHERE) 160-9-4.8 MCG/ACT AERO INHALE 2 PUFFS TWICE DAILY 3 each 1   buPROPion (WELLBUTRIN XL) 300 MG 24 hr tablet TAKE 1 TABLET EVERY DAY 90 tablet 1   calcitRIOL (ROCALTROL) 0.25 MCG capsule Take 0.25 mcg by mouth daily.     Cholecalciferol (VITAMIN D3) 1000 units CAPS Take 1,000 Units by mouth daily.     ferrous sulfate 325 (65 FE) MG EC tablet Take 325 mg by mouth 3 (three) times daily with meals.     fluticasone (FLONASE) 50 MCG/ACT nasal spray Place 1 spray into both nostrils as needed for allergies or rhinitis.     lidocaine (LIDODERM) 5 % Place 1 patch onto the skin daily. Remove & Discard patch within 12 hours or as directed by MD 30 patch 0   lisinopril (ZESTRIL) 20 MG tablet Take 1 tablet (20 mg total) by mouth daily. 90 tablet 1   Multiple Vitamin (MULTIVITAMIN) tablet Take 1 tablet by mouth at bedtime.      propranolol (INDERAL) 40 MG tablet TAKE 1 TABLET TWICE DAILY 180 tablet 1   sertraline  (ZOLOFT) 100 MG tablet TAKE 1 TABLET EVERY DAY 90 tablet 1   tizanidine (ZANAFLEX) 2 MG capsule Take 1 capsule (2 mg total) by mouth at bedtime as needed for muscle spasms. 20 capsule 0   No current facility-administered medications for this visit.      Marland Kitchen  PHYSICAL EXAMINATION: ECOG PERFORMANCE STATUS: 1 - Symptomatic but completely ambulatory  Vitals:   01/26/23 1453 01/26/23 1517  BP: (!) 121/56 121/70  Pulse: 63   Temp: 97.6 F (36.4 C)   SpO2: 95%     Filed Weights   01/26/23 1453  Weight: 226 lb (102.5 kg)     Physical Exam HENT:     Head: Normocephalic and atraumatic.     Mouth/Throat:     Pharynx: No oropharyngeal exudate.  Eyes:     Pupils: Pupils are equal, round, and reactive to light.  Cardiovascular:     Rate and Rhythm: Normal rate and regular rhythm.  Pulmonary:     Effort: No respiratory distress.     Breath sounds: No wheezing.  Abdominal:     General: Bowel sounds are normal. There is no distension.     Palpations: Abdomen is soft. There is no mass.     Tenderness: There is no abdominal tenderness. There is no guarding or rebound.  Musculoskeletal:        General: No tenderness. Normal range of motion.     Cervical back: Normal range of motion and neck supple.  Skin:    General: Skin is warm.  Neurological:     Mental Status: She is alert and oriented to person, place, and time.  Psychiatric:        Mood and Affect: Affect normal.      LABORATORY DATA:  I have reviewed the data as listed Lab Results  Component Value Date   WBC 4.9 01/26/2023   HGB 11.8 (L) 01/26/2023   HCT 36.6 01/26/2023   MCV 93.1 01/26/2023   PLT 249 01/26/2023   Recent Labs    04/19/22 1255 10/22/22 1406 12/02/22 1451 01/26/23 1455  NA 138 137 138 133*  K 4.7 4.7 5.2 4.7  CL 100 100 99 101  CO2 30 26 32 25  GLUCOSE 112* 109* 84 101*  BUN 25* 26* 25 36*  CREATININE 1.38*  1.19* 1.27* 1.28*  CALCIUM 9.3 9.4 10.1 9.5  GFRNONAA 40* 47*  --  43*  PROT  --    --  6.6  --   AST  --   --  16  --   ALT  --   --  14  --   BILITOT  --   --  0.3  --     RADIOGRAPHIC STUDIES: I have personally reviewed the radiological images as listed and agreed with the findings in the report. DG Ribs Bilateral W/Chest  Result Date: 01/20/2023 CLINICAL DATA:  Larey Seat on chest, pain under both breasts. EXAM: BILATERAL RIBS AND CHEST - 4+ VIEW COMPARISON:  None Available. FINDINGS: Chest: The cardiomediastinal silhouette is normal. There is no focal consolidation or pulmonary edema. There is no pleural effusion or pneumothorax. There is a moderate-sized hiatal hernia. There is a possible acute nondisplaced fracture of the right anterior sixth rib. IMPRESSION: 1. Possible acute nondisplaced fracture of the right anterior sixth rib. 2. Moderate-sized hiatal hernia. Electronically Signed   By: Lesia Hausen M.D.   On: 01/20/2023 16:10    ASSESSMENT & PLAN:   Anemia of chronic kidney failure, stage 3 (moderate) # Mild Anemia/IDA- sec to CKD stage III-DEC 2023- Iron sat- 12%;Ferritin-14 ;HOLD off IV venofer; [? cost].   # Currently Continue gentle iron one day- . Hb 11.7-  HOLD venofer; if not improved then venofer.   # CKD- stage III [Dr.Lateef-X]-GFR 40-   stable.   # DISPOSITION: #  HOLD venofer # follow up in 6  Months- MD; labs/cbc/bmpiron studies/fertitin-/possible venofer-Dr.B    All questions were answered. The patient knows to call the clinic with any problems, questions or concerns.    Earna Coder, MD 01/26/2023 3:51 PM

## 2023-01-26 NOTE — Assessment & Plan Note (Addendum)
#   Mild Anemia/IDA- sec to CKD stage III-DEC 2023- Iron sat- 12%;Ferritin-14 ;HOLD off IV venofer; [? cost].   # Currently Continue gentle iron one day- . Hb 11.7-  HOLD venofer; if not improved then venofer.   # CKD- stage III [Dr.Lateef-X]-GFR 40-   stable.   # DISPOSITION: #  HOLD venofer # follow up in 6  Months- MD; labs/cbc/bmpiron studies/fertitin-/possible venofer-Dr.B

## 2023-01-27 ENCOUNTER — Telehealth: Payer: Self-pay | Admitting: Internal Medicine

## 2023-01-27 MED ORDER — TRAMADOL HCL 50 MG PO TABS
50.0000 mg | ORAL_TABLET | Freq: Three times a day (TID) | ORAL | 0 refills | Status: AC | PRN
Start: 2023-01-27 — End: 2023-02-01

## 2023-01-27 NOTE — Telephone Encounter (Signed)
Tramadol sent to pharmacy

## 2023-01-27 NOTE — Telephone Encounter (Signed)
Pt is calling in requesting a medication for pain. Pt says she has fractured ribs and is in pain. Pt is requesting a medication that won't affect her Kidneys due to her having Stage 3 Kidney Disease. Pt says she takes Tylenol and has been prescribed a muscle relaxer and neither are working for her. Pt would like it sent to [facility] in [place].

## 2023-02-18 ENCOUNTER — Telehealth: Payer: Self-pay | Admitting: Internal Medicine

## 2023-02-18 DIAGNOSIS — R269 Unspecified abnormalities of gait and mobility: Secondary | ICD-10-CM

## 2023-02-18 NOTE — Telephone Encounter (Signed)
Pt is calling to request if Rene Kocher will order a walker for her. Needing to call her insurance- [employer] [phone]

## 2023-02-18 NOTE — Telephone Encounter (Signed)
What kind of walker does she want?  A standard walker, a rolling walker with wheels and a seat?

## 2023-02-21 NOTE — Telephone Encounter (Signed)
LMTCB 02/21/2023.  PEC please advise pt when she calls back.   Thanks,   -Vernona Rieger

## 2023-02-22 NOTE — Telephone Encounter (Signed)
Pt stated she would like a rolling walker with wheels and a seat. Mentioned insurance would not cover it all, but the provider sending an order would help, and she would pay the rest out of pocket.  Please advise.

## 2023-02-23 NOTE — Telephone Encounter (Signed)
Pt advised.   Thanks,   -  

## 2023-02-23 NOTE — Addendum Note (Signed)
Addended by: Lorre Munroe on: 02/23/2023 11:36 AM   Modules accepted: Orders

## 2023-02-23 NOTE — Telephone Encounter (Signed)
Rx printed and signed and placed upfront for patient to come pick up

## 2023-03-15 DIAGNOSIS — Z0279 Encounter for issue of other medical certificate: Secondary | ICD-10-CM

## 2023-04-08 DIAGNOSIS — H04123 Dry eye syndrome of bilateral lacrimal glands: Secondary | ICD-10-CM | POA: Diagnosis not present

## 2023-04-08 DIAGNOSIS — H0100A Unspecified blepharitis right eye, upper and lower eyelids: Secondary | ICD-10-CM | POA: Diagnosis not present

## 2023-04-11 DIAGNOSIS — D631 Anemia in chronic kidney disease: Secondary | ICD-10-CM | POA: Diagnosis not present

## 2023-04-11 DIAGNOSIS — I10 Essential (primary) hypertension: Secondary | ICD-10-CM | POA: Diagnosis not present

## 2023-04-11 DIAGNOSIS — N2581 Secondary hyperparathyroidism of renal origin: Secondary | ICD-10-CM | POA: Diagnosis not present

## 2023-04-11 DIAGNOSIS — N1832 Chronic kidney disease, stage 3b: Secondary | ICD-10-CM | POA: Diagnosis not present

## 2023-04-19 ENCOUNTER — Telehealth: Payer: Self-pay

## 2023-04-19 ENCOUNTER — Ambulatory Visit (INDEPENDENT_AMBULATORY_CARE_PROVIDER_SITE_OTHER): Payer: Medicare HMO

## 2023-04-19 ENCOUNTER — Encounter: Payer: Self-pay | Admitting: Internal Medicine

## 2023-04-19 DIAGNOSIS — Z23 Encounter for immunization: Secondary | ICD-10-CM

## 2023-04-19 NOTE — Telephone Encounter (Signed)
RSV vaccine.

## 2023-04-29 ENCOUNTER — Ambulatory Visit: Payer: Self-pay

## 2023-04-29 ENCOUNTER — Telehealth (INDEPENDENT_AMBULATORY_CARE_PROVIDER_SITE_OTHER): Payer: Medicare HMO | Admitting: Physician Assistant

## 2023-04-29 ENCOUNTER — Encounter: Payer: Self-pay | Admitting: Physician Assistant

## 2023-04-29 DIAGNOSIS — N183 Chronic kidney disease, stage 3 unspecified: Secondary | ICD-10-CM

## 2023-04-29 DIAGNOSIS — J449 Chronic obstructive pulmonary disease, unspecified: Secondary | ICD-10-CM | POA: Diagnosis not present

## 2023-04-29 DIAGNOSIS — J069 Acute upper respiratory infection, unspecified: Secondary | ICD-10-CM

## 2023-04-29 MED ORDER — BENZONATATE 100 MG PO CAPS
100.0000 mg | ORAL_CAPSULE | Freq: Two times a day (BID) | ORAL | 0 refills | Status: DC | PRN
Start: 2023-04-29 — End: 2023-06-06

## 2023-04-29 MED ORDER — DM-GUAIFENESIN ER 30-600 MG PO TB12: 1.0000 | ORAL_TABLET | Freq: Two times a day (BID) | ORAL | 0 refills | Status: DC

## 2023-04-29 NOTE — Progress Notes (Signed)
Virtual Visit via Video Note  I connected with Abishai Poss Ellingson on 04/29/23 at  3:00 PM EST by a video enabled telemedicine application and verified that I am speaking with the correct person using two identifiers.  Location: Patient: at home  Provider: [facility], [place], [place]    I discussed the limitations of evaluation and management by telemedicine and the availability of in person appointments. The patient expressed understanding and agreed to proceed.  Chief Complaint  Patient presents with   Cough    Started today, productive   Nasal Congestion    Started yesterday- used a nasal wash w/no help      History of Present Illness:   Discussed the use of AI scribe software for clinical note transcription with the patient, who gave verbal consent to proceed.  History of Present Illness   Karlotta, a patient with a history of stage three kidney disease and COPD, presents with a new onset of throat discomfort. The symptoms began the previous afternoon, characterized by a scratchy sensation in the throat, particularly towards the back. The patient initially dismissed the symptoms, attributing them to a temporary irritation, but the persistence of the discomfort prompted her to seek medical advice.  The patient has been maintaining her regular medication regimen, which includes a flu shot, but has not initiated any new treatments for the throat discomfort other than lozenges and warm tea. She reports no increase in her nebulizer use and her level of breathlessness remains consistent with her usual state, only occurring when she engages in physical activity.  The patient is particularly cautious about her medication intake due to her kidney disease, which she attributes to a prolonged use of Motrin prescribed by a previous doctor. She is keen to avoid any medication that could potentially harm her kidneys further. Despite her throat discomfort, the patient is determined to  continue her daily activities and return to work the following week.      Onset: sudden  Duration: started yesterday  Associated symptoms: started today with chest congestion - did have some sneezing and rhinorrhea yesterday  Had some scratchy throat last night and this AM    She reports normal temperatures   Intervention: hot tea, lozenges   She has not had to increase her nebulizer use  She does report SOBOE but this has not increased in severity since symptoms started     Observations/Objective:   Due to the nature of the virtual visit, physical exam and observations are limited. Able to obtain the following observations:   Alert, oriented, x3 Appears comfortable, in no acute distress.  No scleral injection, no appreciated hoarseness, tachypnea, wheeze or strider. Able to maintain conversation without visible strain.   cough appreciated during visit.    Assessment and Plan:     Problem List Items Addressed This Visit   None Visit Diagnoses       Upper respiratory tract infection, unspecified type    -  Primary   Relevant Medications   benzonatate (TESSALON) 100 MG capsule   dextromethorphan-guaiFENesin (MUCINEX DM) 30-600 MG 12hr tablet      Assessment and Plan    URI Scratchy throat onset yesterday, likely viral. No fever. Concern for COPD exacerbation if symptoms worsen. Safe to take Mucinex and Robitussin. Mucinex efficacy improves with increased fluid intake. No adverse effects on kidney function expected. - Recommend Mucinex and Robitussin for symptom relief - will send in script for Tessalon pearls  - Advise increased fluid intake -  Monitor symptoms and contact healthcare provider if symptoms worsen or if experiencing fever, chills, or increased difficulty breathing - Use nebulizer treatments as needed  Chronic Obstructive Pulmonary Disease (COPD) Well-managed COPD with baseline dyspnea on exertion. Patient prefers to avoid rushing to prevent  shortness of breath. - Continue current COPD management - Use nebulizer treatments as needed - Seek urgent care if experiencing extreme difficulty breathing  Stage 3 Chronic Kidney Disease (CKD) Stage 3 CKD, well-managed. Patient cautious about medications due to past adverse effects from long-term NSAID use, specifically Motrin, which led to kidney damage. - Avoid nephrotoxic medications - Ensure any new medications are safe for kidney function  General Health Maintenance Received flu shot. Did not receive RSV vaccine due to uncertainty of prior infection. - Continue with routine vaccinations as appropriate  Follow-up - Follow up with healthcare provider if symptoms worsen or if experiencing fever, chills, or increased difficulty breathing - Visit urgent care over the weekend if necessary.       Follow Up Instructions:    I discussed the assessment and treatment plan with the patient. The patient was provided an opportunity to ask questions and all were answered. The patient agreed with the plan and demonstrated an understanding of the instructions.   The patient was advised to call back or seek an in-person evaluation if the symptoms worsen or if the condition fails to improve as anticipated.  I provided 11 minutes of non-face-to-face time during this encounter.  No follow-ups on file.   I, Jaden Batchelder E Annalisa Colonna, PA-C, have reviewed all documentation for this visit. The documentation on 04/29/23 for the exam, diagnosis, procedures, and orders are all accurate and complete.   Jacquelin Hawking, MHS, PA-C [facility]

## 2023-04-29 NOTE — Telephone Encounter (Signed)
Summary: Cold flu symptoms, no appt   Pt called reporting severe cold/flu symptoms. No appt available today. Severe chest congestion and cough  Best contact: [phone]     Called pt - left message on machine to return our call.

## 2023-04-29 NOTE — Telephone Encounter (Signed)
    Summary: Cold flu symptoms, no appt   Pt called reporting severe cold/flu symptoms. No appt available today. Severe chest congestion and cough  Best contact: [phone]     Chief Complaint: Cold symptoms Symptoms: chest congestion, cough, clear sputum, runny nose Frequency: constant  Pertinent Negatives: Patient denies fever, nausea, vomiting Disposition: [] ED /[] Urgent Care (no appt availability in office) / [x] Appointment(In office/virtual)/ []  [facility]/ [] Home Care/ [] Refused Recommended Disposition /[] [facility]/ []  Follow-up with PCP Additional Notes: Patient stated she had a runny nose yesterday and congestion. Today she has a bad cough and it is causing discomfort in the throat when she coughs. Patient states she can feel tightness in the chest but can not get a lot of the sputum up and out of the chest. Patient states she is using a sea salt nasal wash without much improvement. Care advice was given and patient has been scheduled for a mychart video visit today with Denny Peon due to no availability at PCP office.  Reason for Disposition  [1] Sinus congestion (pressure, fullness) AND [2] present > 10 days  Answer Assessment - Initial Assessment Questions 1. ONSET: "When did the nasal discharge start?"      Yesterday  2. AMOUNT: "How much discharge is there?"      Moderate 3. COUGH: "Do you have a cough?" If Yes, ask: "Describe the color of your sputum" (clear, white, yellow, green)     Yes moderate cough with clear sputum  4. RESPIRATORY DISTRESS: "Describe your breathing."      Normal  5. FEVER: "Do you have a fever?" If Yes, ask: "What is your temperature, how was it measured, and when did it start?"   No,  97.0  6. SEVERITY: "Overall, how bad are you feeling right now?" (e.g., doesn't interfere with normal activities, staying home from school/work, staying in bed)      My chest is just so tight from congestion  7. OTHER SYMPTOMS: "Do you have any  other symptoms?" (e.g., sore throat, earache, wheezing, vomiting)     Sore throat, chest congestion, cough, runny nose  Protocols used: Common Cold-A-AH

## 2023-04-29 NOTE — Telephone Encounter (Signed)
Noted  

## 2023-04-29 NOTE — Patient Instructions (Addendum)

## 2023-05-04 ENCOUNTER — Other Ambulatory Visit: Payer: Self-pay | Admitting: Internal Medicine

## 2023-05-04 NOTE — Telephone Encounter (Signed)
Requested Prescriptions  Pending Prescriptions Disp Refills   lisinopril (ZESTRIL) 20 MG tablet [Pharmacy Med Name: Lisinopril Oral Tablet 20 MG] 90 tablet 0    Sig: TAKE 1 TABLET EVERY DAY     Cardiovascular:  ACE Inhibitors Failed - 05/04/2023 11:13 AM      Failed - Cr in normal range and within 180 days    Creatinine  Date Value Ref Range Status  01/26/2023 1.28 (H) 0.44 - 1.00 mg/dL Final   Creat  Date Value Ref Range Status  12/02/2022 1.27 (H) 0.60 - 1.00 mg/dL Final         Passed - K in normal range and within 180 days    Potassium  Date Value Ref Range Status  01/26/2023 4.7 3.5 - 5.1 mmol/L Final  02/25/2014 4.2 3.5 - 5.1 mmol/L Final         Passed - Patient is not pregnant      Passed - Last BP in normal range    BP Readings from Last 1 Encounters:  01/26/23 121/70         Passed - Valid encounter within last 6 months    Recent Outpatient Visits           5 days ago Upper respiratory tract infection, unspecified type   [facility], Oswaldo Conroy, PA-C   3 months ago Rib pain   [facility] [place], Salvadore Oxford, NP   5 months ago Encounter for general adult medical examination with abnormal findings   [facility] [place], Salvadore Oxford, NP   8 months ago Primary hypertension   [place] [place], Salvadore Oxford, NP   9 months ago Primary hypertension    [facility] [place], Salvadore Oxford, NP       Future Appointments             In 1 month [place], Salvadore Oxford, NP  [facility], [facility]

## 2023-05-10 ENCOUNTER — Encounter: Payer: Self-pay | Admitting: Internal Medicine

## 2023-05-28 ENCOUNTER — Other Ambulatory Visit: Payer: Self-pay | Admitting: Internal Medicine

## 2023-05-30 NOTE — Telephone Encounter (Signed)
 Requested medication (s) are due for refill today - provider review   Requested medication (s) are on the active medication list -yes  Future visit scheduled -yes  Last refill: 12/30/22 3each 1RF  Notes to clinic: off protocol- provider review   Requested Prescriptions  Pending Prescriptions Disp Refills   BREZTRI  AEROSPHERE 160-9-4.8 MCG/ACT AERO [Pharmacy Med Name: Breztri  Aerosphere Inhalation Aerosol 160-9-4.8 MCG/ACT] 3 each 3    Sig: INHALE 2 PUFFS TWICE DAILY     Off-Protocol Failed - 05/30/2023 12:18 PM      Failed - Medication not assigned to a protocol, review manually.      Passed - Valid encounter within last 12 months    Recent Outpatient Visits           1 month ago Upper respiratory tract infection, unspecified type   [facility]ecum, Rocky BRAVO, PA-C   4 months ago Rib pain   [facility] [place], Angeline ORN, NP   5 months ago Encounter for general adult medical examination with abnormal findings   [facility] [place], Angeline ORN, NP   9 months ago Primary hypertension   [place] [facility] [place], Angeline ORN, NP   10 months ago Primary hypertension   [place] [facility] [place], Angeline ORN, NP       Future Appointments             In 1 week Kristie Byrd, Angeline ORN, NP [facility], [facility]               Requested Prescriptions  Pending Prescriptions Disp Refills   BREZTRI  AEROSPHERE 160-9-4.8 MCG/ACT TERESE Byrd Med Name: Breztri  Aerosphere Inhalation Aerosol 160-9-4.8 MCG/ACT] 3 each 3    Sig: INHALE 2 PUFFS TWICE DAILY     Off-Protocol Failed - 05/30/2023 12:18 PM      Failed - Medication not assigned to a protocol, review manually.      Passed - Valid encounter within last 12 months    Recent Outpatient Visits           1 month ago Upper respiratory tract infection, unspecified type   [facility] Mecum, Rocky BRAVO, PA-C   4 months ago Rib pain   [place] [facility] [place], Angeline ORN, NP   5 months ago Encounter for general adult medical examination with abnormal findings   [place] [facility] [place], Angeline ORN, NP   9 months ago Primary hypertension   [place] [place], Angeline ORN, NP   10 months ago Primary hypertension   [place] [facility] [place], Angeline ORN, NP       Future Appointments             In 1 week Kristie Byrd, Angeline ORN, NP [place] [facility], [facility]

## 2023-06-06 ENCOUNTER — Encounter: Payer: Self-pay | Admitting: Internal Medicine

## 2023-06-06 ENCOUNTER — Ambulatory Visit (INDEPENDENT_AMBULATORY_CARE_PROVIDER_SITE_OTHER): Payer: Medicare HMO | Admitting: Internal Medicine

## 2023-06-06 VITALS — BP 124/74 | Ht 65.0 in | Wt 220.2 lb

## 2023-06-06 DIAGNOSIS — I10 Essential (primary) hypertension: Secondary | ICD-10-CM | POA: Diagnosis not present

## 2023-06-06 DIAGNOSIS — R7303 Prediabetes: Secondary | ICD-10-CM | POA: Diagnosis not present

## 2023-06-06 DIAGNOSIS — F419 Anxiety disorder, unspecified: Secondary | ICD-10-CM

## 2023-06-06 DIAGNOSIS — Z6836 Body mass index (BMI) 36.0-36.9, adult: Secondary | ICD-10-CM

## 2023-06-06 DIAGNOSIS — K219 Gastro-esophageal reflux disease without esophagitis: Secondary | ICD-10-CM

## 2023-06-06 DIAGNOSIS — G4733 Obstructive sleep apnea (adult) (pediatric): Secondary | ICD-10-CM

## 2023-06-06 DIAGNOSIS — M1711 Unilateral primary osteoarthritis, right knee: Secondary | ICD-10-CM

## 2023-06-06 DIAGNOSIS — M810 Age-related osteoporosis without current pathological fracture: Secondary | ICD-10-CM

## 2023-06-06 DIAGNOSIS — G250 Essential tremor: Secondary | ICD-10-CM

## 2023-06-06 DIAGNOSIS — N1832 Chronic kidney disease, stage 3b: Secondary | ICD-10-CM

## 2023-06-06 DIAGNOSIS — E66812 Obesity, class 2: Secondary | ICD-10-CM

## 2023-06-06 DIAGNOSIS — D631 Anemia in chronic kidney disease: Secondary | ICD-10-CM

## 2023-06-06 DIAGNOSIS — J439 Emphysema, unspecified: Secondary | ICD-10-CM | POA: Diagnosis not present

## 2023-06-06 DIAGNOSIS — F32A Depression, unspecified: Secondary | ICD-10-CM

## 2023-06-06 DIAGNOSIS — N183 Chronic kidney disease, stage 3 unspecified: Secondary | ICD-10-CM

## 2023-06-06 DIAGNOSIS — E781 Pure hyperglyceridemia: Secondary | ICD-10-CM

## 2023-06-06 MED ORDER — LIDOCAINE 5 % EX PTCH: 1.0000 | MEDICATED_PATCH | CUTANEOUS | 1 refills | Status: DC

## 2023-06-06 NOTE — Assessment & Plan Note (Signed)
A1c today Encourage low-carb diet and exercise for weight

## 2023-06-06 NOTE — Assessment & Plan Note (Signed)
Controlled on lisinopril and propranolol Reinforced DASH diet and exercise for weight loss C-Met today

## 2023-06-06 NOTE — Patient Instructions (Signed)

## 2023-06-06 NOTE — Assessment & Plan Note (Signed)
Stable on sertraline and bupropion Support offered

## 2023-06-06 NOTE — Assessment & Plan Note (Signed)
Continue lisinopril for renal protection She will continue to follow with nephrology 

## 2023-06-06 NOTE — Progress Notes (Signed)
Subjective:    Patient ID: Kristie Byrd, female    DOB: 02-Sep-1945, 78 y.o.   MRN: [id number]  HPI  Patient presents to clinic today for follow-up of chronic conditions.  HTN: Her BP today is 124/74.  She is taking lisinopril and propranolol as prescribed.  ECG from 04/2016 reviewed.  COPD: She has intermittent shortness of breath but denies chronic cough..  She is taking breztri as prescribed but is not taking albuterol as needed.  PFTs from 11/2013 reviewed.  OSA: She averages 7 hours of sleep per night without the use of her CPAP.  Sleep study from 02/2022 reviewed.  GERD: Triggered by tomato based foods. She does not take anything OTC for this.  She takes as needed with good relief of symptoms.  There is no upper GI on file.  HLD: Her last LDL was 106, triglycerides 148, 11/2022.  She is not taking any cholesterol-lowering medication at this time.  She does not consume a low-fat diet.  Tremor: Managed with propranolol.  She does not follow with neurology.  OA: Mainly in her back and knees.  She takes tylenol as needed with good relief of symptoms.  She does not follow with orthopedics.  CKD 3: Her last creatinine was 1.28, GFR 43, 01/2023 she is on lisinopril for renal protection.  She follows with nephrology.  Anemia: Her last H/H was 11.8/38.6, 01/2023.  She is taking iron as presribed.  She follows with hematology.  Anxiety and depression: Chronic, managed on sertraline and bupropion.  She is not currently seeing a therapist.  She denies SI/HI.  Prediabetes: Her last A1c was 5.8%, 11/2022.  She is not taking any oral diabetic medications time.  She does not check her sugars.  Osteoporosis: She is taking calcium and vitamin d OTC.  She does not get weightbearing exercise.  Bone density from 09/2020 reviewed.   Review of Systems     Past Medical History:  Diagnosis Date   Arthritis    CKD (chronic kidney disease), stage III (HCC)    Colon polyps    COPD (chronic  obstructive pulmonary disease) (HCC)    Hypertension    Iron deficiency anemia 09/27/2012   PONV (postoperative nausea and vomiting)    Shingles    Sleep apnea    starting CPAP in 2024   Uses walker    or cane   Wears dentures    full upper and lower   Wears hearing aid in right ear     Current Outpatient Medications  Medication Sig Dispense Refill   acetaminophen (TYLENOL) 650 MG CR tablet Take 1,000 mg by mouth every 4 (four) hours as needed.     albuterol (PROVENTIL) (2.5 MG/3ML) 0.083% nebulizer solution Take 3 mLs (2.5 mg total) by nebulization every 6 (six) hours as needed for wheezing or shortness of breath. 150 mL 1   benzonatate (TESSALON) 100 MG capsule Take 1 capsule (100 mg total) by mouth 2 (two) times daily as needed for cough. 20 capsule 0   Budeson-Glycopyrrol-Formoterol (BREZTRI AEROSPHERE) 160-9-4.8 MCG/ACT AERO INHALE 2 PUFFS TWICE DAILY 3 each 1   buPROPion (WELLBUTRIN XL) 300 MG 24 hr tablet TAKE 1 TABLET EVERY DAY 90 tablet 1   calcitRIOL (ROCALTROL) 0.25 MCG capsule Take 0.25 mcg by mouth daily.     Cholecalciferol (VITAMIN D3) 1000 units CAPS Take 1,000 Units by mouth daily.     dextromethorphan-guaiFENesin (MUCINEX DM) 30-600 MG 12hr tablet Take 1 tablet by mouth 2 (two) times  daily. 20 tablet 0   ferrous sulfate 325 (65 FE) MG EC tablet Take 325 mg by mouth 3 (three) times daily with meals.     fluticasone (FLONASE) 50 MCG/ACT nasal spray Place 1 spray into both nostrils as needed for allergies or rhinitis.     lidocaine (LIDODERM) 5 % Place 1 patch onto the skin daily. Remove & Discard patch within 12 hours or as directed by MD 30 patch 0   lisinopril (ZESTRIL) 20 MG tablet TAKE 1 TABLET EVERY DAY 90 tablet 0   Multiple Vitamin (MULTIVITAMIN) tablet Take 1 tablet by mouth at bedtime.      propranolol (INDERAL) 40 MG tablet TAKE 1 TABLET TWICE DAILY 180 tablet 1   sertraline (ZOLOFT) 100 MG tablet TAKE 1 TABLET EVERY DAY 90 tablet 1   tizanidine (ZANAFLEX) 2  MG capsule Take 1 capsule (2 mg total) by mouth at bedtime as needed for muscle spasms. 20 capsule 0   No current facility-administered medications for this visit.    Allergies  Allergen Reactions   Codeine Nausea And Vomiting   Demerol [Meperidine] Nausea And Vomiting   Fleet Phospho-Soda [Sodium Phosphates] Nausea And Vomiting    Vomiting    Latex Rash    Blisters   Other Other (See Comments)    Band-Aid, blister   Prednisone Other (See Comments)    Chest pressure    Family History  Problem Relation Age of Onset   Colon cancer Daughter     Social History   Socioeconomic History   Marital status: Divorced    Spouse name: Not on file   Number of children: 2   Years of education: Not on file   Highest education level: Not on file  Occupational History   Occupation: retired  Tobacco Use   Smoking status: Former    Current packs/day: 0.00    Average packs/day: 1 pack/day for 37.0 years (37.0 ttl pk-yrs)    Types: Cigarettes    Start date: 11/16/1959    Quit date: 11/15/1996    Years since quitting: 26.5   Smokeless tobacco: Never  Vaping Use   Vaping status: Never Used  Substance and Sexual Activity   Alcohol use: No    Alcohol/week: 0.0 standard drinks of alcohol   Drug use: No    Comment: uses CBD oil    Sexual activity: Never  Other Topics Concern   Not on file  Social History Narrative   Lives alone.  Divorced. Two daughters- in 10s, 3 grandchildren.   Does not know family history- raised in [facility] [facility].        Desires CPR.  Does not have HPOA.   She would possibly want life support if for short period of time . Unsure about feeding tube.   Social Drivers of Corporate investment banker Strain: Low Risk  (09/30/2022)   Overall Financial Resource Strain (CARDIA)    Difficulty of Paying Living Expenses: Not hard at all  Food Insecurity: No Food Insecurity (09/30/2022)   Hunger Vital Sign    Worried About Running Out of Food in the Last Year:  Never true    Ran Out of Food in the Last Year: Never true  Transportation Needs: No Transportation Needs (09/30/2022)   PRAPARE - Administrator, Civil Service (Medical): No    Lack of Transportation (Non-Medical): No  Physical Activity: Inactive (09/30/2022)   Exercise Vital Sign    Days of Exercise per Week: 0  days    Minutes of Exercise per Session: 0 min  Stress: No Stress Concern Present (09/30/2022)   [employer] of Occupational Health - Occupational Stress Questionnaire    Feeling of Stress : Not at all  Social Connections: Socially Isolated (09/30/2022)   Social Connection and Isolation Panel [NHANES]    Frequency of Communication with Friends and Family: More than three times a week    Frequency of Social Gatherings with Friends and Family: Twice a week    Attends Religious Services: Never    Database administrator or Organizations: No    Attends Banker Meetings: Never    Marital Status: Divorced  Catering manager Violence: Not At Risk (09/30/2022)   Humiliation, Afraid, Rape, and Kick questionnaire    Fear of Current or Ex-Partner: No    Emotionally Abused: No    Physically Abused: No    Sexually Abused: No     Constitutional: Denies fever, malaise, fatigue, headache or abrupt weight changes.  HEENT: Denies eye pain, eye redness, ear pain, ringing in the ears, wax buildup, runny nose, nasal congestion, bloody nose, or sore throat. Respiratory: Patient reports intermittent shortness of breath.  Denies difficulty breathing, cough or sputum production.   Cardiovascular: Denies chest pain, chest tightness, palpitations or swelling in the hands or feet.  Gastrointestinal: Patient reports intermittent reflux.  Denies abdominal pain, bloating, constipation, diarrhea or blood in the stool.  GU: Denies urgency, frequency, pain with urination, burning sensation, blood in urine, odor or discharge. Musculoskeletal: Patient reports joint pain, right  shoulder pain, decrease in range of motion.  Denies difficulty with gait, muscle pain or joint swelling.  Skin: Denies redness, rashes, lesions or ulcercations.  Neurological: Patient reports tremor.  Denies dizziness, difficulty with memory, difficulty with speech or problems with balance and coordination.  Psych: Patient has a history of anxiety and depression.  Denies SI/HI.  No other specific complaints in a complete review of systems (except as listed in HPI above).  Objective:   Physical Exam   BP 124/74 (BP Location: Left Arm, Patient Position: Sitting, Cuff Size: Large)   Ht 5\' 5"  (1.651 m)   Wt 220 lb 3.2 oz (99.9 kg)   BMI 36.64 kg/m    Wt Readings from Last 3 Encounters:  01/26/23 226 lb (102.5 kg)  01/20/23 225 lb (102.1 kg)  12/02/22 239 lb (108.4 kg)    General: Appears her stated age, obese, in NAD. Skin: Warm, dry and intact. No ulcerations noted. HEENT: Head: normal shape and size; Eyes: sclera white, no icterus, conjunctiva pink, PERRLA and EOMs intact;  Cardiovascular: Normal rate and rhythm. S1 and S2 noted. No murmur, rubs or gallops noted. No JVD or BLE edema.  Pulmonary/Chest: Normal effort and positive vesicular breath sounds. No respiratory distress. No wheezes, rales or ronchi noted.  Abdomen: Active bowel sounds. Musculoskeletal: Decreased internal and external rotation of the right shoulder.  Pain with palpation over the proximal anterior biceps and triceps tendon.  Shoulder shrug equal.  Strength 5/5 BUE.  Handgrips equal.  Gait slow and steady with use of rolling walker. Neurological: Alert and oriented. Coordination normal.  Psychiatric: Mood and affect normal. Behavior is normal. Judgment and thought content normal.  BMET    Component Value Date/Time   NA 133 (L) 01/26/2023 1455   NA 137 02/25/2014 1404   K 4.7 01/26/2023 1455   K 4.2 02/25/2014 1404   CL 101 01/26/2023 1455   CL 102 02/25/2014 1404  CO2 25 01/26/2023 1455   CO2 28  02/25/2014 1404   GLUCOSE 101 (H) 01/26/2023 1455   GLUCOSE 124 (H) 02/25/2014 1404   BUN 36 (H) 01/26/2023 1455   BUN 20 02/10/2017 0000   BUN 24 (H) 02/25/2014 1404   CREATININE 1.28 (H) 01/26/2023 1455   CREATININE 1.27 (H) 12/02/2022 1451   CALCIUM 9.5 01/26/2023 1455   CALCIUM 9.7 04/06/2021 1322   GFRNONAA 43 (L) 01/26/2023 1455   GFRNONAA 44 (L) 02/25/2014 1404   GFRNONAA 45 (L) 10/26/2013 1330   GFRAA 56 (L) 10/03/2019 1324   GFRAA 54 (L) 02/25/2014 1404   GFRAA 52 (L) 10/26/2013 1330    Lipid Panel     Component Value Date/Time   CHOL 195 12/02/2022 1451   TRIG 148 12/02/2022 1451   HDL 63 12/02/2022 1451   CHOLHDL 3.1 12/02/2022 1451   VLDL 37 04/02/2020 1242   LDLCALC 106 (H) 12/02/2022 1451    CBC    Component Value Date/Time   WBC 4.9 01/26/2023 1455   WBC 5.4 12/02/2022 1451   RBC 3.93 01/26/2023 1455   HGB 11.8 (L) 01/26/2023 1455   HGB 11.4 (L) 02/25/2014 1404   HCT 36.6 01/26/2023 1455   HCT 34.9 (L) 02/25/2014 1404   PLT 249 01/26/2023 1455   PLT 256 02/25/2014 1404   MCV 93.1 01/26/2023 1455   MCV 89 02/25/2014 1404   MCH 30.0 01/26/2023 1455   MCHC 32.2 01/26/2023 1455   RDW 13.2 01/26/2023 1455   RDW 14.7 (H) 02/25/2014 1404   LYMPHSABS 1.0 01/26/2023 1455   LYMPHSABS 1.1 02/25/2014 1404   MONOABS 0.5 01/26/2023 1455   MONOABS 0.5 02/25/2014 1404   EOSABS 0.0 01/26/2023 1455   EOSABS 0.0 02/25/2014 1404   BASOSABS 0.0 01/26/2023 1455   BASOSABS 0.1 02/25/2014 1404    Hgb A1C Lab Results  Component Value Date   HGBA1C 5.8 (H) 12/02/2022           Assessment & Plan:     RTC in 6 months for your annual exam Nicki Reaper, NP

## 2023-06-06 NOTE — Assessment & Plan Note (Signed)
Encouraged diet and exercise for weight loss ?

## 2023-06-06 NOTE — Assessment & Plan Note (Signed)
Continue calcium and vitamin D Encourage daily weightbearing exercise 

## 2023-06-06 NOTE — Assessment & Plan Note (Signed)
Avoid foods that trigger reflux Encourage weight loss as this can help reduce reflux symptoms Okay to take Tums OTC as needed

## 2023-06-06 NOTE — Assessment & Plan Note (Signed)
Encourage weight loss as this can help reduce joint pain Okay to continue Tylenol OTC as needed

## 2023-06-06 NOTE — Assessment & Plan Note (Signed)
Continue Breztri and albuterol Will monitor

## 2023-06-06 NOTE — Assessment & Plan Note (Signed)
C-Met and lipid profile today Encouraged her to consume low-fat diet 

## 2023-06-06 NOTE — Assessment & Plan Note (Signed)
Encourage weight loss as this can produce sleep apnea symptoms Noncompliant with CPAP use

## 2023-06-06 NOTE — Assessment & Plan Note (Signed)
-  Continue propranolol 

## 2023-06-06 NOTE — Assessment & Plan Note (Signed)
CBC today Follows with hematology

## 2023-06-07 ENCOUNTER — Encounter: Payer: Self-pay | Admitting: Internal Medicine

## 2023-06-07 LAB — HEMOGLOBIN A1C
Hgb A1c MFr Bld: 5.6 %{Hb} (ref <5.7)
Mean Plasma Glucose: 114 mg/dL
eAG (mmol/L): 6.3 mmol/L

## 2023-06-07 LAB — CBC
HCT: 35.3 % (ref 35.0–45.0)
Hemoglobin: 11.4 g/dL — ABNORMAL LOW (ref 11.7–15.5)
MCH: 29.4 pg (ref 27.0–33.0)
MCHC: 32.3 g/dL (ref 32.0–36.0)
MCV: 91 fL (ref 80.0–100.0)
MPV: 10 fL (ref 7.5–12.5)
Platelets: 252 10*3/uL (ref 140–400)
RBC: 3.88 10*6/uL (ref 3.80–5.10)
RDW: 13 % (ref 11.0–15.0)
WBC: 5 10*3/uL (ref 3.8–10.8)

## 2023-06-07 LAB — COMPLETE METABOLIC PANEL WITH GFR
AG Ratio: 1.7 (calc) (ref 1.0–2.5)
ALT: 11 U/L (ref 6–29)
AST: 14 U/L (ref 10–35)
Albumin: 4 g/dL (ref 3.6–5.1)
Alkaline phosphatase (APISO): 80 U/L (ref 37–153)
BUN/Creatinine Ratio: 20 (calc) (ref 6–22)
BUN: 26 mg/dL — ABNORMAL HIGH (ref 7–25)
CO2: 31 mmol/L (ref 20–32)
Calcium: 9.7 mg/dL (ref 8.6–10.4)
Chloride: 103 mmol/L (ref 98–110)
Creat: 1.29 mg/dL — ABNORMAL HIGH (ref 0.60–1.00)
Globulin: 2.3 g/dL (ref 1.9–3.7)
Glucose, Bld: 90 mg/dL (ref 65–139)
Potassium: 5 mmol/L (ref 3.5–5.3)
Sodium: 140 mmol/L (ref 135–146)
Total Bilirubin: 0.3 mg/dL (ref 0.2–1.2)
Total Protein: 6.3 g/dL (ref 6.1–8.1)
eGFR: 43 mL/min/{1.73_m2} — ABNORMAL LOW (ref >=60)

## 2023-06-07 LAB — LIPID PANEL
Cholesterol: 198 mg/dL (ref <200)
HDL: 62 mg/dL (ref >=50)
LDL Cholesterol (Calc): 103 mg/dL — ABNORMAL HIGH
Non-HDL Cholesterol (Calc): 136 mg/dL — ABNORMAL HIGH (ref <130)
Total CHOL/HDL Ratio: 3.2 (calc) (ref <5.0)
Triglycerides: 214 mg/dL — ABNORMAL HIGH (ref <150)

## 2023-06-20 MED ORDER — ATORVASTATIN CALCIUM 10 MG PO TABS: 10.0000 mg | ORAL_TABLET | Freq: Every day | ORAL | 1 refills | Status: DC

## 2023-06-24 ENCOUNTER — Other Ambulatory Visit: Payer: Self-pay | Admitting: Internal Medicine

## 2023-06-24 DIAGNOSIS — F419 Anxiety disorder, unspecified: Secondary | ICD-10-CM

## 2023-06-24 DIAGNOSIS — R251 Tremor, unspecified: Secondary | ICD-10-CM

## 2023-06-27 DIAGNOSIS — N2581 Secondary hyperparathyroidism of renal origin: Secondary | ICD-10-CM | POA: Diagnosis not present

## 2023-06-27 DIAGNOSIS — I1310 Hypertensive heart and chronic kidney disease without heart failure, with stage 1 through stage 4 chronic kidney disease, or unspecified chronic kidney disease: Secondary | ICD-10-CM | POA: Diagnosis not present

## 2023-06-27 DIAGNOSIS — N1832 Chronic kidney disease, stage 3b: Secondary | ICD-10-CM | POA: Diagnosis not present

## 2023-06-27 DIAGNOSIS — D631 Anemia in chronic kidney disease: Secondary | ICD-10-CM | POA: Diagnosis not present

## 2023-06-27 DIAGNOSIS — I10 Essential (primary) hypertension: Secondary | ICD-10-CM | POA: Diagnosis not present

## 2023-06-27 NOTE — Telephone Encounter (Signed)
 Requested Prescriptions  Pending Prescriptions Disp Refills   propranolol  (INDERAL ) 40 MG tablet [Pharmacy Med Name: Propranolol  HCl Oral Tablet 40 MG] 180 tablet 0    Sig: TAKE 1 TABLET TWICE DAILY     Cardiovascular:  Beta Blockers Passed - 06/27/2023  9:09 AM      Passed - Last BP in normal range    BP Readings from Last 1 Encounters:  06/06/23 124/74         Passed - Last Heart Rate in normal range    Pulse Readings from Last 1 Encounters:  01/26/23 63         Passed - Valid encounter within last 6 months    Recent Outpatient Visits           3 weeks ago Stage 3b chronic kidney disease (HCC)   [place] [facility] [place], [place], NP   1 month ago Upper respiratory tract infection, unspecified type   [facility] [place], Pearla Bottom, PA-C   5 months ago Rib pain   [place] [facility] [place], [place], NP   6 months ago Encounter for general adult medical examination with abnormal findings   [place] [facility] [place], Rankin Buzzard, NP   10 months ago Primary hypertension   [place] [place], Rankin Buzzard, NP       Future Appointments             In 5 months [place], Rankin Buzzard, NP [place] [facility]             buPROPion  (WELLBUTRIN  XL) 300 MG 24 hr tablet [Pharmacy Med Name: buPROPion  HCl ER (XL) Oral Tablet Extended Release 24 Hour 300 MG] 90 tablet 0    Sig: TAKE 1 TABLET EVERY DAY     Psychiatry: Antidepressants - bupropion  Failed - 06/27/2023  9:09 AM      Failed - Cr in normal range and within 360 days    Creat  Date Value Ref Range Status  06/06/2023 1.29 (H) 0.60 - 1.00 mg/dL Final         Passed - AST in normal range and within 360 days    AST  Date Value Ref Range Status  06/06/2023 14 10 - 35 U/L Final   SGOT(AST)  Date Value Ref Range Status  02/25/2014 15 15 - 37 Unit/L Final         Passed - ALT in normal range and  within 360 days    ALT  Date Value Ref Range Status  06/06/2023 11 6 - 29 U/L Final   SGPT (ALT)  Date Value Ref Range Status  02/25/2014 23 U/L Final    Comment:    14-63 NOTE: New Reference Range 12/04/13          Passed - Completed PHQ-2 or PHQ-9 in the last 360 days      Passed - Last BP in normal range    BP Readings from Last 1 Encounters:  06/06/23 124/74         Passed - Valid encounter within last 6 months    Recent Outpatient Visits           3 weeks ago Stage 3b chronic kidney disease [facility])   [place] [facility] [place], Rankin Buzzard, NP   1 month ago Upper respiratory tract infection, unspecified type   [facility] Mecum, Pearla Bottom, PA-C   5 months ago Rib pain   [place] [facility] [place], Rankin Buzzard, NP   6 months ago Encounter for general adult medical examination with abnormal findings   [place] [facility] [place], Rankin Buzzard, NP   10 months ago Primary hypertension   [facility] [place], Rankin Buzzard, NP       Future Appointments             In 5 months [place], Rankin Buzzard, NP [place] [facility]             sertraline  (ZOLOFT ) 100 MG tablet [Pharmacy Med Name: Sertraline  HCl Oral Tablet 100 MG] 90 tablet 0    Sig: TAKE 1 TABLET EVERY DAY     Psychiatry:  Antidepressants - SSRI - sertraline  Passed - 06/27/2023  9:09 AM      Passed - AST in normal range and within 360 days    AST  Date Value Ref Range Status  06/06/2023 14 10 - 35 U/L Final   SGOT(AST)  Date Value Ref Range Status  02/25/2014 15 15 - 37 Unit/L Final         Passed - ALT in normal range and within 360 days    ALT  Date Value Ref Range Status  06/06/2023 11 6 - 29 U/L Final   SGPT (ALT)  Date Value Ref Range Status  02/25/2014 23 U/L Final    Comment:    14-63 NOTE: New Reference Range 12/04/13          Passed - Completed PHQ-2 or PHQ-9 in the  last 360 days      Passed - Valid encounter within last 6 months    Recent Outpatient Visits           3 weeks ago Stage 3b chronic kidney disease [facility])   [place] [facility] [place], Rankin Buzzard, NP   1 month ago Upper respiratory tract infection, unspecified type   [facility] Mecum, Pearla Bottom, PA-C   5 months ago Rib pain   [place] [facility] [place], Rankin Buzzard, NP   6 months ago Encounter for general adult medical examination with abnormal findings   [place] [facility] [place], Rankin Buzzard, NP   10 months ago Primary hypertension   [place] [facility] [place], Rankin Buzzard, NP       Future Appointments             In 5 months [place], Rankin Buzzard, NP [place] [facility], [facility]

## 2023-07-16 ENCOUNTER — Other Ambulatory Visit: Payer: Self-pay | Admitting: Internal Medicine

## 2023-07-18 NOTE — Telephone Encounter (Signed)
 Requested Prescriptions  Pending Prescriptions Disp Refills   lisinopril (ZESTRIL) 20 MG tablet [Pharmacy Med Name: Lisinopril Oral Tablet 20 MG] 90 tablet 1    Sig: TAKE 1 TABLET EVERY DAY     Cardiovascular:  ACE Inhibitors Failed - 07/18/2023  1:51 PM      Failed - Cr in normal range and within 180 days    Creat  Date Value Ref Range Status  06/06/2023 1.29 (H) 0.60 - 1.00 mg/dL Final         Passed - K in normal range and within 180 days    Potassium  Date Value Ref Range Status  06/06/2023 5.0 3.5 - 5.3 mmol/L Final  02/25/2014 4.2 3.5 - 5.1 mmol/L Final         Passed - Patient is not pregnant      Passed - Last BP in normal range    BP Readings from Last 1 Encounters:  06/06/23 124/74         Passed - Valid encounter within last 6 months    Recent Outpatient Visits           1 month ago Stage 3b chronic kidney disease (HCC)   [place] [facility] [place], Salvadore Oxford, NP   2 months ago Upper respiratory tract infection, unspecified type   [facility] Mecum, Oswaldo Conroy, PA-C   5 months ago Rib pain   [place] [facility] [place], Salvadore Oxford, NP   7 months ago Encounter for general adult medical examination with abnormal findings   [place] [place], Salvadore Oxford, NP   11 months ago Primary hypertension   [place] [facility] [place], Salvadore Oxford, NP       Future Appointments             In 4 months Baity, Salvadore Oxford, NP [place] [facility], [facility]

## 2023-07-26 ENCOUNTER — Inpatient Hospital Stay: Payer: Medicare HMO | Attending: Internal Medicine

## 2023-07-26 ENCOUNTER — Encounter: Payer: Self-pay | Admitting: Internal Medicine

## 2023-07-26 ENCOUNTER — Ambulatory Visit: Payer: Medicare HMO

## 2023-07-26 ENCOUNTER — Inpatient Hospital Stay (HOSPITAL_BASED_OUTPATIENT_CLINIC_OR_DEPARTMENT_OTHER): Payer: Medicare HMO | Admitting: Internal Medicine

## 2023-07-26 VITALS — BP 119/61 | HR 70 | Temp 97.8°F | Resp 16 | Wt 216.0 lb

## 2023-07-26 DIAGNOSIS — N183 Chronic kidney disease, stage 3 unspecified: Secondary | ICD-10-CM

## 2023-07-26 DIAGNOSIS — D631 Anemia in chronic kidney disease: Secondary | ICD-10-CM | POA: Diagnosis present

## 2023-07-26 DIAGNOSIS — Z79899 Other long term (current) drug therapy: Secondary | ICD-10-CM | POA: Diagnosis not present

## 2023-07-26 LAB — CBC WITH DIFFERENTIAL (CANCER CENTER ONLY)
Abs Immature Granulocytes: 0.02 10*3/uL (ref 0.00–0.07)
Basophils Absolute: 0 10*3/uL (ref 0.0–0.1)
Basophils Relative: 1 %
Eosinophils Absolute: 0 10*3/uL (ref 0.0–0.5)
Eosinophils Relative: 0 %
HCT: 38.1 % (ref 36.0–46.0)
Hemoglobin: 11.9 g/dL — ABNORMAL LOW (ref 12.0–15.0)
Immature Granulocytes: 0 %
Lymphocytes Relative: 18 %
Lymphs Abs: 0.9 10*3/uL (ref 0.7–4.0)
MCH: 29.2 pg (ref 26.0–34.0)
MCHC: 31.2 g/dL (ref 30.0–36.0)
MCV: 93.4 fL (ref 80.0–100.0)
Monocytes Absolute: 0.6 10*3/uL (ref 0.1–1.0)
Monocytes Relative: 12 %
Neutro Abs: 3.4 10*3/uL (ref 1.7–7.7)
Neutrophils Relative %: 69 %
Platelet Count: 243 10*3/uL (ref 150–400)
RBC: 4.08 MIL/uL (ref 3.87–5.11)
RDW: 13.7 % (ref 11.5–15.5)
WBC Count: 4.9 10*3/uL (ref 4.0–10.5)
nRBC: 0 % (ref 0.0–0.2)

## 2023-07-26 LAB — BASIC METABOLIC PANEL
Anion gap: 8 (ref 5–15)
BUN: 29 mg/dL — ABNORMAL HIGH (ref 8–23)
CO2: 27 mmol/L (ref 22–32)
Calcium: 9.7 mg/dL (ref 8.9–10.3)
Chloride: 99 mmol/L (ref 98–111)
Creatinine, Ser: 1.23 mg/dL — ABNORMAL HIGH (ref 0.44–1.00)
GFR, Estimated: 46 mL/min — ABNORMAL LOW (ref >60)
Glucose, Bld: 106 mg/dL — ABNORMAL HIGH (ref 70–99)
Potassium: 5.1 mmol/L (ref 3.5–5.1)
Sodium: 134 mmol/L — ABNORMAL LOW (ref 135–145)

## 2023-07-26 LAB — IRON AND TIBC
Iron: 90 ug/dL (ref 28–170)
Saturation Ratios: 21 % (ref 10.4–31.8)
TIBC: 431 ug/dL (ref 250–450)
UIBC: 341 ug/dL

## 2023-07-26 LAB — FERRITIN: Ferritin: 17 ng/mL (ref 11–307)

## 2023-07-26 NOTE — Assessment & Plan Note (Addendum)
#   Mild Anemia/IDA- sec to CKD stage III-DEC 2023- Iron sat- 12%;Ferritin-14 ;HOLD off IV venofer; [? cost].   # Currently Continue gentle iron one day- . Hb 11.9-  HOLD venofer; if not improved then venofer.   # CKD- stage III [Dr.Lateef-X]-GFR 46-   stable.   # severe right shoulder pain that radiates down near her elbow- unlikely from vaccine- ? Arthritis- defer to PCP.   # DISPOSITION: #  HOLD venofer # follow up in 6  Months- MD; labs/cbc/bmpiron studies/fertitin-/possible venofer-Dr.B

## 2023-07-26 NOTE — Progress Notes (Signed)
 Patient is doing ok, she is feeling more fatigue recently. She has been having some severe right shoulder pain that radiates down near her elbow, she thinks that it is from her getting a flu vaccine a while back. She has told her doctor but they say that wouldn't be the cause of her arm pain. She would like to discuss taking Asprin 81 mg daily, she has heard that it can cause blood clots so she would like Dr. Sharlette Dense opinion.

## 2023-07-26 NOTE — Progress Notes (Signed)
 [facility] CONSULT NOTE  Patient Care Team: Lorre Munroe, NP as PCP - General (Internal Medicine) Lupita Leash, MD as Consulting Physician (Pulmonary Disease) Johney Maine, MD as Consulting Physician (Oncology) Iva Boop, MD as Consulting Physician (Gastroenterology) Lockie Mola, MD as Referring Physician (Ophthalmology) Earna Coder, MD as Consulting Physician (Oncology)  CHIEF COMPLAINTS/PURPOSE OF CONSULTATION: Anemia  # ANEMIA- sec to IDA/CKD [Dr.Lateef-hemoglobin 11.5/iron saturation 13% ferritin 30; intolerant to p.o. iron.]# colonoscopy-~ 2016; [not until 2024]; STOPPED Aurexa [JAN 2023]  # CKD stage III  # Hx of smoking quit  Oncology History   No history exists.   HISTORY OF PRESENTING ILLNESS: Walks with a rolling walker because of back pain.  Alone.  Kristie Byrd 78 y.o.  female longstanding history of chronic kidney disease stage III/ongoing anemia is here for a follow up.  Patient compliant with her PO iron-no constipation with gentle iron.  No nausea no vomiting. No blood in stools or black or stools.  Patient is doing ok, she is feeling more fatigue recently. She has been having some severe right shoulder pain that radiates down near her elbow, she thinks that it is from her getting a flu vaccine almost a month ago..     Review of Systems  Constitutional:  Positive for malaise/fatigue. Negative for chills, diaphoresis, fever and weight loss.  HENT:  Negative for nosebleeds and sore throat.   Eyes:  Negative for double vision.  Respiratory:  Negative for cough, hemoptysis, sputum production, shortness of breath and wheezing.   Cardiovascular:  Negative for chest pain, palpitations, orthopnea and leg swelling.  Gastrointestinal:  Positive for constipation. Negative for abdominal pain, blood in stool, diarrhea, heartburn, melena, nausea and vomiting.  Genitourinary:  Negative for dysuria, frequency and urgency.   Musculoskeletal:  Negative for back pain and joint pain.  Skin: Negative.  Negative for itching and rash.  Neurological:  Negative for dizziness, tingling, focal weakness, weakness and headaches.  Endo/Heme/Allergies:  Does not bruise/bleed easily.  Psychiatric/Behavioral:  Negative for depression. The patient is not nervous/anxious and does not have insomnia.      MEDICAL HISTORY:  Past Medical History:  Diagnosis Date   Arthritis    CKD (chronic kidney disease), stage III (HCC)    Colon polyps    COPD (chronic obstructive pulmonary disease) (HCC)    Hypertension    Iron deficiency anemia 09/27/2012   PONV (postoperative nausea and vomiting)    Shingles    Sleep apnea    starting CPAP in 2024   Uses walker    or cane   Wears dentures    full upper and lower   Wears hearing aid in right ear     SURGICAL HISTORY: Past Surgical History:  Procedure Laterality Date   BREAST BIOPSY Bilateral    neg   CARPAL TUNNEL RELEASE Bilateral    CATARACT EXTRACTION W/PHACO Right 05/19/2022   Procedure: CATARACT EXTRACTION PHACO AND INTRAOCULAR LENS PLACEMENT (IOC) RIGHT  5.64  00:50.6;  Surgeon: Lockie Mola, MD;  [facility];  Service: Ophthalmology;  Laterality: Right;  Sleep apnea   CATARACT EXTRACTION W/PHACO Left 06/02/2022   Procedure: CATARACT EXTRACTION PHACO AND INTRAOCULAR LENS PLACEMENT (IOC) LEFT 4.93 52.5;  Surgeon: Lockie Mola, MD;  Location[facility];  Service: Ophthalmology;  Laterality: Left;  sleep apnea   COLONOSCOPY  multiple   KNEE ARTHROPLASTY Right 05/12/2016   Procedure: COMPUTER ASSISTED TOTAL KNEE ARTHROPLASTY;  Surgeon: Donato Heinz,  MD;  Location: [facility] ORS;  Service: Orthopedics;  Laterality: Right;   KNEE SURGERY Right    NOSE SURGERY     TONSILLECTOMY      SOCIAL HISTORY: Social History   Socioeconomic History   Marital status: Divorced    Spouse name: Not on file   Number of children: 2   Years of  education: Not on file   Highest education level: Not on file  Occupational History   Occupation: retired  Tobacco Use   Smoking status: Former    Current packs/day: 0.00    Average packs/day: 1 pack/day for 37.0 years (37.0 ttl pk-yrs)    Types: Cigarettes    Start date: 11/16/1959    Quit date: 11/15/1996    Years since quitting: 26.7   Smokeless tobacco: Never  Vaping Use   Vaping status: Never Used  Substance and Sexual Activity   Alcohol use: No    Alcohol/week: 0.0 standard drinks of alcohol   Drug use: No    Comment: uses CBD oil    Sexual activity: Never  Other Topics Concern   Not on file  Social History Narrative   Lives alone.  Divorced. Two daughters- in 23s, 3 grandchildren.   Does not know family history- raised in [place].        Desires CPR.  Does not have HPOA.   She would possibly want life support if for short period of time . Unsure about feeding tube.   Social Drivers of Corporate investment banker Strain: Low Risk  (09/30/2022)   Overall Financial Resource Strain (CARDIA)    Difficulty of Paying Living Expenses: Not hard at all  Food Insecurity: No Food Insecurity (09/30/2022)   Hunger Vital Sign    Worried About Running Out of Food in the Last Year: Never true    Ran Out of Food in the Last Year: Never true  Transportation Needs: No Transportation Needs (09/30/2022)   PRAPARE - Administrator, Civil Service (Medical): No    Lack of Transportation (Non-Medical): No  Physical Activity: Inactive (09/30/2022)   Exercise Vital Sign    Days of Exercise per Week: 0 days    Minutes of Exercise per Session: 0 min  Stress: No Stress Concern Present (09/30/2022)   [employer] of Occupational Health - Occupational Stress Questionnaire    Feeling of Stress : Not at all  Social Connections: Socially Isolated (09/30/2022)   Social Connection and Isolation Panel [NHANES]    Frequency of Communication with Friends and Family: More than  three times a week    Frequency of Social Gatherings with Friends and Family: Twice a week    Attends Religious Services: Never    Database administrator or Organizations: No    Attends Banker Meetings: Never    Marital Status: Divorced  Catering manager Violence: Not At Risk (09/30/2022)   Humiliation, Afraid, Rape, and Kick questionnaire    Fear of Current or Ex-Partner: No    Emotionally Abused: No    Physically Abused: No    Sexually Abused: No    FAMILY HISTORY: Family History  Problem Relation Age of Onset   Colon cancer Daughter     ALLERGIES:  is allergic to codeine, demerol [meperidine], fleet phospho-soda [sodium phosphates], latex, other, and prednisone.  MEDICATIONS:  Current Outpatient Medications  Medication Sig Dispense Refill   acetaminophen (TYLENOL) 650 MG CR tablet Take 1,000 mg by mouth every 4 (four)  hours as needed.     albuterol (PROVENTIL) (2.5 MG/3ML) 0.083% nebulizer solution Take 3 mLs (2.5 mg total) by nebulization every 6 (six) hours as needed for wheezing or shortness of breath. 150 mL 1   atorvastatin (LIPITOR) 10 MG tablet Take 1 tablet (10 mg total) by mouth daily. 90 tablet 1   Budeson-Glycopyrrol-Formoterol (BREZTRI AEROSPHERE) 160-9-4.8 MCG/ACT AERO INHALE 2 PUFFS TWICE DAILY 3 each 1   buPROPion (WELLBUTRIN XL) 300 MG 24 hr tablet TAKE 1 TABLET EVERY DAY 90 tablet 0   calcitRIOL (ROCALTROL) 0.25 MCG capsule Take 0.25 mcg by mouth daily.     Cholecalciferol (VITAMIN D3) 1000 units CAPS Take 1,000 Units by mouth daily.     ferrous sulfate 325 (65 FE) MG EC tablet Take 325 mg by mouth 3 (three) times daily with meals.     lidocaine (LIDODERM) 5 % Place 1 patch onto the skin daily. Remove & Discard patch within 12 hours or as directed by MD 30 patch 1   lisinopril (ZESTRIL) 20 MG tablet TAKE 1 TABLET EVERY DAY 90 tablet 1   Multiple Vitamin (MULTIVITAMIN) tablet Take 1 tablet by mouth at bedtime.      propranolol (INDERAL) 40 MG tablet  TAKE 1 TABLET TWICE DAILY 180 tablet 0   sertraline (ZOLOFT) 100 MG tablet TAKE 1 TABLET EVERY DAY 90 tablet 0   No current facility-administered medications for this visit.      Marland Kitchen  PHYSICAL EXAMINATION: ECOG PERFORMANCE STATUS: 1 - Symptomatic but completely ambulatory  Vitals:   07/26/23 1514  BP: 119/61  Pulse: 70  Resp: 16  Temp: 97.8 F (36.6 C)  SpO2: 98%    Filed Weights   07/26/23 1514  Weight: 216 lb (98 kg)     Physical Exam HENT:     Head: Normocephalic and atraumatic.     Mouth/Throat:     Pharynx: No oropharyngeal exudate.  Eyes:     Pupils: Pupils are equal, round, and reactive to light.  Cardiovascular:     Rate and Rhythm: Normal rate and regular rhythm.  Pulmonary:     Effort: No respiratory distress.     Breath sounds: No wheezing.  Abdominal:     General: Bowel sounds are normal. There is no distension.     Palpations: Abdomen is soft. There is no mass.     Tenderness: There is no abdominal tenderness. There is no guarding or rebound.  Musculoskeletal:        General: No tenderness. Normal range of motion.     Cervical back: Normal range of motion and neck supple.  Skin:    General: Skin is warm.  Neurological:     Mental Status: She is alert and oriented to person, place, and time.  Psychiatric:        Mood and Affect: Affect normal.      LABORATORY DATA:  I have reviewed the data as listed Lab Results  Component Value Date   WBC 4.9 07/26/2023   HGB 11.9 (L) 07/26/2023   HCT 38.1 07/26/2023   MCV 93.4 07/26/2023   PLT 243 07/26/2023   Recent Labs    10/22/22 1406 12/02/22 1451 01/26/23 1455 06/06/23 1441 07/26/23 1449  NA 137 138 133* 140 134*  K 4.7 5.2 4.7 5.0 5.1  CL 100 99 101 103 99  CO2 26 32 25 31 27   GLUCOSE 109* 84 101* 90 106*  BUN 26* 25 36* 26* 29*  CREATININE 1.19* 1.27* 1.28* 1.29*  1.23*  CALCIUM 9.4 10.1 9.5 9.7 9.7  GFRNONAA 47*  --  43*  --  46*  PROT  --  6.6  --  6.3  --   AST  --  16  --  14   --   ALT  --  14  --  11  --   BILITOT  --  0.3  --  0.3  --     RADIOGRAPHIC STUDIES: I have personally reviewed the radiological images as listed and agreed with the findings in the report. No results found.  ASSESSMENT & PLAN:   Anemia of chronic kidney failure, stage 3 (moderate) # Mild Anemia/IDA- sec to CKD stage III-DEC 2023- Iron sat- 12%;Ferritin-14 ;HOLD off IV venofer; [? cost].   # Currently Continue gentle iron one day- . Hb 11.9-  HOLD venofer; if not improved then venofer.   # CKD- stage III [Dr.Lateef-X]-GFR 46-   stable.   # severe right shoulder pain that radiates down near her elbow- unlikely from vaccine- ? Arthritis- defer to PCP.   # DISPOSITION: #  HOLD venofer # follow up in 6  Months- MD; labs/cbc/bmpiron studies/fertitin-/possible venofer-Dr.B     All questions were answered. The patient knows to call the clinic with any problems, questions or concerns.    Earna Coder, MD 07/26/2023 3:44 PM

## 2023-07-27 ENCOUNTER — Inpatient Hospital Stay

## 2023-07-30 ENCOUNTER — Other Ambulatory Visit: Payer: Self-pay | Admitting: Internal Medicine

## 2023-08-01 NOTE — Telephone Encounter (Signed)
 Requested Prescriptions  Pending Prescriptions Disp Refills   lidocaine (LIDODERM) 5 % [Pharmacy Med Name: Lidocaine External Patch 5 %] 60 patch 2    Sig: PLACE 1 PATCH ONTO THE SKIN DAILY. REMOVE AND DISCARD PATCH WITHIN 12 HOURS OR AS DIRECTED BY MD     Analgesics:  Topicals Failed - 08/01/2023  4:02 PM      Failed - Manual Review: Labs are only required if the patient has taken medication for more than 8 weeks.      Failed - HGB in normal range and within 360 days    Hemoglobin  Date Value Ref Range Status  07/26/2023 11.9 (L) 12.0 - 15.0 g/dL Final   HGB  Date Value Ref Range Status  02/25/2014 11.4 (L) 12.0 - 16.0 g/dL Final         Failed - Cr in normal range and within 360 days    Creat  Date Value Ref Range Status  06/06/2023 1.29 (H) 0.60 - 1.00 mg/dL Final   Creatinine, Ser  Date Value Ref Range Status  07/26/2023 1.23 (H) 0.44 - 1.00 mg/dL Final         Passed - PLT in normal range and within 360 days    Platelet  Date Value Ref Range Status  02/25/2014 256 150 - 440 x10 3/mm  Final   Platelet Count  Date Value Ref Range Status  07/26/2023 243 150 - 400 K/uL Final         Passed - HCT in normal range and within 360 days    HCT  Date Value Ref Range Status  07/26/2023 38.1 36.0 - 46.0 % Final  02/25/2014 34.9 (L) 35.0 - 47.0 % Final         Passed - eGFR is 30 or above and within 360 days    EGFR (African American)  Date Value Ref Range Status  02/25/2014 54 (L) >58mL/min Final  10/26/2013 52 (L)  Final   GFR calc Af Amer  Date Value Ref Range Status  10/03/2019 56 (L) >60 mL/min Final   EGFR (Non-African Amer.)  Date Value Ref Range Status  02/25/2014 44 (L) >91mL/min Final    Comment:    eGFR values <41mL/min/1.73 m2 may be an indication of chronic kidney disease (CKD). Calculated eGFR, using the MRDR Study equation, is useful in  patients with stable renal function. The eGFR calculation will not be reliable in acutely ill patients when  serum creatinine is changing rapidly. It is not useful in patients on dialysis. The eGFR calculation may not be applicable to patients at the low and high extremes of body sizes, pregnant women, and vegetarians.   10/26/2013 45 (L)  Final    Comment:    eGFR values <59mL/min/1.73 m2 may be an indication of chronic kidney disease (CKD). Calculated eGFR is useful in patients with stable renal function. The eGFR calculation will not be reliable in acutely ill patients when serum creatinine is changing rapidly. It is not useful in  patients on dialysis. The eGFR calculation may not be applicable to patients at the low and high extremes of body sizes, pregnant women, and vegetarians.    GFR, Estimated  Date Value Ref Range Status  07/26/2023 46 (L) >60 mL/min Final    Comment:    (NOTE) Calculated using the CKD-EPI Creatinine Equation (2021)   01/26/2023 43 (L) >60 mL/min Final    Comment:    (NOTE) Calculated using the CKD-EPI Creatinine Equation (2021)  GFR  Date Value Ref Range Status  08/19/2017 48.01 (L) >60.00 mL/min Final   eGFR  Date Value Ref Range Status  06/06/2023 43 (L) > OR = 60 mL/min/1.2m2 Final         Passed - Patient is not pregnant      Passed - Valid encounter within last 12 months    Recent Outpatient Visits           1 month ago Stage 3b chronic kidney disease [facility])   [place] [facility] [place], Salvadore [place], NP   3 months ago Upper respiratory tract infection, unspecified type   [facility] [place], Oswaldo Conroy, PA-C   6 months ago Rib pain   [place] [facility] [place], Salvadore Oxford, NP   8 months ago Encounter for general adult medical examination with abnormal findings   [place] [facility] [place], Salvadore Oxford, NP   11 months ago Primary hypertension   [place] [facility] [place], Salvadore Oxford, NP       Future Appointments             In 4 months Baity,  Salvadore Oxford, NP [place] [facility], [facility]

## 2023-09-07 ENCOUNTER — Other Ambulatory Visit: Payer: Self-pay | Admitting: Orthopedic Surgery

## 2023-09-07 DIAGNOSIS — M25511 Pain in right shoulder: Secondary | ICD-10-CM

## 2023-09-08 ENCOUNTER — Encounter: Payer: Self-pay | Admitting: Orthopedic Surgery

## 2023-09-08 ENCOUNTER — Encounter: Payer: Self-pay | Admitting: Internal Medicine

## 2023-09-12 ENCOUNTER — Other Ambulatory Visit: Payer: Self-pay | Admitting: Internal Medicine

## 2023-09-12 DIAGNOSIS — F419 Anxiety disorder, unspecified: Secondary | ICD-10-CM

## 2023-09-12 DIAGNOSIS — F32A Depression, unspecified: Secondary | ICD-10-CM

## 2023-09-12 DIAGNOSIS — R251 Tremor, unspecified: Secondary | ICD-10-CM

## 2023-09-14 NOTE — Telephone Encounter (Signed)
 Requested Prescriptions  Pending Prescriptions Disp Refills   buPROPion  (WELLBUTRIN  XL) 300 MG 24 hr tablet [Pharmacy Med Name: buPROPion  HCl ER (XL) Oral Tablet Extended Release 24 Hour 300 MG] 90 tablet 0    Sig: TAKE 1 TABLET EVERY DAY     Psychiatry: Antidepressants - bupropion  Failed - 09/14/2023  1:57 PM      Failed - Cr in normal range and within 360 days    Creat  Date Value Ref Range Status  06/06/2023 1.29 (H) 0.60 - 1.00 mg/dL Final   Creatinine, Ser  Date Value Ref Range Status  07/26/2023 1.23 (H) 0.44 - 1.00 mg/dL Final         Failed - Valid encounter within last 6 months    Recent Outpatient Visits   None     Future Appointments             In 2 months Baity, Rankin Buzzard, NP [place] [facility]            Passed - AST in normal range and within 360 days    AST  Date Value Ref Range Status  06/06/2023 14 10 - 35 U/L Final   SGOT(AST)  Date Value Ref Range Status  02/25/2014 15 15 - 37 Unit/L Final         Passed - ALT in normal range and within 360 days    ALT  Date Value Ref Range Status  06/06/2023 11 6 - 29 U/L Final   SGPT (ALT)  Date Value Ref Range Status  02/25/2014 23 U/L Final    Comment:    14-63 NOTE: New Reference Range 12/04/13          Passed - Completed PHQ-2 or PHQ-9 in the last 360 days      Passed - Last BP in normal range    BP Readings from Last 1 Encounters:  07/26/23 119/61          propranolol  (INDERAL ) 40 MG tablet [Pharmacy Med Name: Propranolol  HCl Oral Tablet 40 MG] 180 tablet 0    Sig: TAKE 1 TABLET TWICE DAILY     Cardiovascular:  Beta Blockers Failed - 09/14/2023  1:57 PM      Failed - Valid encounter within last 6 months    Recent Outpatient Visits   None     Future Appointments             In 2 months Baity, Rankin Buzzard, NP [place] [facility]            Passed - Last BP in normal range    BP Readings from Last 1 Encounters:  07/26/23 119/61          Passed - Last Heart Rate in normal range    Pulse Readings from Last 1 Encounters:  07/26/23 70          sertraline  (ZOLOFT ) 100 MG tablet [Pharmacy Med Name: Sertraline  HCl Oral Tablet 100 MG] 90 tablet 0    Sig: TAKE 1 TABLET EVERY DAY     Psychiatry:  Antidepressants - SSRI - sertraline  Failed - 09/14/2023  1:57 PM      Failed - Valid encounter within last 6 months    Recent Outpatient Visits   None     Future Appointments             In 2 months Baity, Rankin Buzzard, NP  [facility], [facility]  Passed - AST in normal range and within 360 days    AST  Date Value Ref Range Status  06/06/2023 14 10 - 35 U/L Final   SGOT(AST)  Date Value Ref Range Status  02/25/2014 15 15 - 37 Unit/L Final         Passed - ALT in normal range and within 360 days    ALT  Date Value Ref Range Status  06/06/2023 11 6 - 29 U/L Final   SGPT (ALT)  Date Value Ref Range Status  02/25/2014 23 U/L Final    Comment:    14-63 NOTE: New Reference Range 12/04/13          Passed - Completed PHQ-2 or PHQ-9 in the last 360 days

## 2023-09-17 ENCOUNTER — Ambulatory Visit
Admission: RE | Admit: 2023-09-17 | Discharge: 2023-09-17 | Disposition: A | Discharge status: Home / Self Care | Source: Ambulatory Visit | Attending: Orthopedic Surgery

## 2023-09-17 DIAGNOSIS — M25511 Pain in right shoulder: Secondary | ICD-10-CM

## 2023-10-06 ENCOUNTER — Ambulatory Visit (INDEPENDENT_AMBULATORY_CARE_PROVIDER_SITE_OTHER): Payer: Medicare HMO

## 2023-10-06 VITALS — Ht 65.0 in | Wt 216.0 lb

## 2023-10-06 DIAGNOSIS — Z0000 Encounter for general adult medical examination without abnormal findings: Secondary | ICD-10-CM

## 2023-10-06 NOTE — Progress Notes (Signed)
 Subjective:   Kristie Byrd is a 78 y.o. who presents for a Medicare Wellness preventive visit.  As a reminder, Annual Wellness Visits don't include a physical exam, and some assessments may be limited, especially if this visit is performed virtually. We may recommend an in-person follow-up visit with your provider if needed.  Visit Complete: Virtual I connected with  Kristie Byrd on 10/06/23 by a audio enabled telemedicine application and verified that I am speaking with the correct person using two identifiers.  Patient Location: Home  Provider Location: Home Office  I discussed the limitations of evaluation and management by telemedicine. The patient expressed understanding and agreed to proceed.  Vital Signs: Because this visit was a virtual/telehealth visit, some criteria may be missing or patient reported. Any vitals not documented were not able to be obtained and vitals that have been documented are patient reported.    Persons Participating in Visit: Patient.  AWV Questionnaire: No: Patient Medicare AWV questionnaire was not completed prior to this visit.  Cardiac Risk Factors include: advanced age (>110men, >62 women);hypertension     Objective:     Today's Vitals   10/06/23 1552  Weight: 216 lb (98 kg)  Height: 5\' 5"  (1.651 m)   Body mass index is 35.94 kg/m.     10/06/2023    4:00 PM 07/26/2023    3:06 PM 07/26/2023    2:47 PM 01/26/2023    3:06 PM 01/26/2023    2:53 PM 10/22/2022    2:22 PM 09/30/2022    3:01 PM  Advanced Directives  Does Patient Have a Medical Advance Directive? No No No No No No No  Would patient like information on creating a medical advance directive? No - Patient declined  No - Patient declined  No - Patient declined No - Patient declined No - Patient declined    Current Medications (verified) Outpatient Encounter Medications as of 10/06/2023  Medication Sig   acetaminophen  (TYLENOL ) 650 MG CR tablet Take 1,000 mg by mouth every 4  (four) hours as needed.   albuterol  (PROVENTIL ) (2.5 MG/3ML) 0.083% nebulizer solution Take 3 mLs (2.5 mg total) by nebulization every 6 (six) hours as needed for wheezing or shortness of breath.   atorvastatin  (LIPITOR) 10 MG tablet Take 1 tablet (10 mg total) by mouth daily.   Budeson-Glycopyrrol-Formoterol (BREZTRI  AEROSPHERE) 160-9-4.8 MCG/ACT AERO INHALE 2 PUFFS TWICE DAILY   buPROPion  (WELLBUTRIN  XL) 300 MG 24 hr tablet TAKE 1 TABLET EVERY DAY   calcitRIOL (ROCALTROL) 0.25 MCG capsule Take 0.25 mcg by mouth daily.   Cholecalciferol  (VITAMIN D3) 1000 units CAPS Take 1,000 Units by mouth daily.   ferrous sulfate  325 (65 FE) MG EC tablet Take 325 mg by mouth 3 (three) times daily with meals.   lidocaine  (LIDODERM ) 5 % PLACE 1 PATCH ONTO THE SKIN DAILY. REMOVE AND DISCARD PATCH WITHIN 12 HOURS OR AS DIRECTED BY MD   lisinopril  (ZESTRIL ) 20 MG tablet TAKE 1 TABLET EVERY DAY   Multiple Vitamin (MULTIVITAMIN) tablet Take 1 tablet by mouth at bedtime.    propranolol  (INDERAL ) 40 MG tablet TAKE 1 TABLET TWICE DAILY   sertraline  (ZOLOFT ) 100 MG tablet TAKE 1 TABLET EVERY DAY   No facility-administered encounter medications on file as of 10/06/2023.    Allergies (verified) Codeine, Demerol [meperidine], Fleet phospho-soda [sodium phosphates], Latex, Other, and Prednisone   History: Past Medical History:  Diagnosis Date   Arthritis    CKD (chronic kidney disease), stage III (HCC)  Colon polyps    COPD (chronic obstructive pulmonary disease) (HCC)    Hypertension    Iron  deficiency anemia 09/27/2012   PONV (postoperative nausea and vomiting)    Shingles    Sleep apnea    starting CPAP in 2024   Uses walker    or cane   Wears dentures    full upper and lower   Wears hearing aid in right ear    Past Surgical History:  Procedure Laterality Date   BREAST BIOPSY Bilateral    neg   CARPAL TUNNEL RELEASE Bilateral    CATARACT EXTRACTION W/PHACO Right 05/19/2022   Procedure: CATARACT  EXTRACTION PHACO AND INTRAOCULAR LENS PLACEMENT (IOC) RIGHT  5.64  00:50.6;  Surgeon: Annell Kidney, MD;  Location: [facility];  Service: Ophthalmology;  Laterality: Right;  Sleep apnea   CATARACT EXTRACTION W/PHACO Left 06/02/2022   Procedure: CATARACT EXTRACTION PHACO AND INTRAOCULAR LENS PLACEMENT (IOC) LEFT 4.93 52.5;  Surgeon: Annell Kidney, MD;  Location: [facility];  Service: Ophthalmology;  Laterality: Left;  sleep apnea   COLONOSCOPY  multiple   KNEE ARTHROPLASTY Right 05/12/2016   Procedure: COMPUTER ASSISTED TOTAL KNEE ARTHROPLASTY;  Surgeon: Arlyne Lame, MD;  Location: [facility];  Service: Orthopedics;  Laterality: Right;   KNEE SURGERY Right    NOSE SURGERY     TONSILLECTOMY     Family History  Problem Relation Age of Onset   Colon cancer Daughter    Social History   Socioeconomic History   Marital status: Divorced    Spouse name: Not on file   Number of children: 2   Years of education: Not on file   Highest education level: Not on file  Occupational History   Occupation: retired  Tobacco Use   Smoking status: Former    Current packs/day: 0.00    Average packs/day: 1 pack/day for 37.0 years (37.0 ttl pk-yrs)    Types: Cigarettes    Start date: 11/16/1959    Quit date: 11/15/1996    Years since quitting: 26.9   Smokeless tobacco: Never  Vaping Use   Vaping status: Never Used  Substance and Sexual Activity   Alcohol  use: No    Alcohol /week: 0.0 standard drinks of alcohol    Drug use: No    Comment: uses CBD oil    Sexual activity: Never  Other Topics Concern   Not on file  Social History Narrative   Lives alone.  Divorced. Two daughters- in 16s, 3 grandchildren.   Does not know family history- raised in [place] [place] [place].        Desires CPR.  Does not have HPOA.   She would possibly want life support if for short period of time . Unsure about feeding tube.   Social Drivers of Corporate investment banker Strain: Low Risk   (10/06/2023)   Overall Financial Resource Strain (CARDIA)    Difficulty of Paying Living Expenses: Not hard at all  Food Insecurity: No Food Insecurity (10/06/2023)   Hunger Vital Sign    Worried About Running Out of Food in the Last Year: Never true    Ran Out of Food in the Last Year: Never true  Transportation Needs: No Transportation Needs (10/06/2023)   PRAPARE - Administrator, Civil Service (Medical): No    Lack of Transportation (Non-Medical): No  Physical Activity: Inactive (10/06/2023)   Exercise Vital Sign    Days of Exercise per Week: 0 days    Minutes  of Exercise per Session: 0 min  Stress: No Stress Concern Present (10/06/2023)   [employer] of Occupational Health - Occupational Stress Questionnaire    Feeling of Stress : Not at all  Social Connections: Moderately Integrated (10/06/2023)   Social Connection and Isolation Panel [NHANES]    Frequency of Communication with Friends and Family: More than three times a week    Frequency of Social Gatherings with Friends and Family: More than three times a week    Attends Religious Services: More than 4 times per year    Active Member of [employer] or Organizations: Yes    Attends Engineer, structural: More than 4 times per year    Marital Status: Divorced    Tobacco Counseling Counseling given: Not Answered    Clinical Intake:  Pre-visit preparation completed: Yes  Pain : No/denies pain     BMI - recorded: 35.94 Nutritional Status: BMI > 30  Obese Nutritional Risks: None Diabetes: No  Lab Results  Component Value Date   HGBA1C 5.6 06/06/2023   HGBA1C 5.8 (H) 12/02/2022   HGBA1C 5.5 09/24/2021     How often do you need to have someone help you when you read instructions, pamphlets, or other written materials from your doctor or pharmacy?: 1 - Never  Interpreter Needed?: No  Information entered by :: Farris Hong LPN   Activities of Daily Living     10/06/2023    3:58 PM 12/02/2022     2:47 PM  In your present state of health, do you have any difficulty performing the following activities:  Hearing? 1 1  Comment Wears Hearing Aids   Vision? 0 1  Difficulty concentrating or making decisions? 0 0  Walking or climbing stairs? 1 1  Comment Uses a Restaurant manager, fast food   Dressing or bathing? 0 0  Doing errands, shopping? 0 0  Preparing Food and eating ? N   Using the Toilet? N   In the past six months, have you accidently leaked urine? N   Do you have problems with loss of bowel control? N   Managing your Medications? N   Managing your Finances? N   Housekeeping or managing your Housekeeping? N     Patient Care Team: Carollynn Cirri, NP as PCP - General (Internal Medicine) Marine Sia, MD as Consulting Physician (Pulmonary Disease) Malone Sear, MD as Consulting Physician (Oncology) Kenney Peacemaker, MD as Consulting Physician (Gastroenterology) Annell Kidney, MD as Referring Physician (Ophthalmology) Gwyn Leos, MD as Consulting Physician (Oncology)  Indicate any recent Medical Services you may have received from other than [facility] providers in the past year (date may be approximate).     Assessment:    This is a routine wellness examination for Kristie Byrd.  Hearing/Vision screen Hearing Screening - Comments:: Wears Hearing Aids Vision Screening - Comments:: Wears rx glasses - up to date with routine eye exams with  [facility]   Goals Addressed               This Visit's Progress     Remain active (pt-stated)         Depression Screen     10/06/2023    3:57 PM 12/02/2022    2:47 PM 09/30/2022    2:58 PM 08/09/2022    2:58 PM 07/26/2022   10:37 AM 05/07/2022   11:20 AM 09/16/2020    2:13 PM  PHQ 2/9 Scores  PHQ - 2 Score 0 0 0 0 0  0 6  PHQ- 9 Score   0    21    Fall Risk     10/06/2023    3:59 PM 12/02/2022    2:47 PM 09/30/2022    3:02 PM 08/09/2022    2:59 PM 07/26/2022   10:37 AM  Fall Risk   Falls in the past year?  1 0 1 1 0  Number falls in past yr: 0  1 1   Injury with Fall? 0 0 0 0 0  Risk for fall due to : No Fall Risks Impaired mobility Orthopedic patient;History of fall(s) Impaired balance/gait;Impaired mobility No Fall Risks  Follow up Falls evaluation completed  Falls evaluation completed;Falls prevention discussed      MEDICARE RISK AT HOME:  Medicare Risk at Home Any stairs in or around the home?: No If so, are there any without handrails?: No Home free of loose throw rugs in walkways, pet beds, electrical cords, etc?: Yes Adequate lighting in your home to reduce risk of falls?: Yes Life alert?: No Use of a cane, walker or w/c?: Yes Grab bars in the bathroom?: No Shower chair or bench in shower?: Yes Elevated toilet seat or a handicapped toilet?: No  TIMED UP AND GO:  Was the test performed?  No  Cognitive Function: 6CIT completed    09/19/2020    1:04 PM 08/28/2018    2:21 PM 08/22/2017    2:40 PM 07/05/2016    8:11 AM  MMSE - Mini Mental State Exam  Orientation to time 5 5 5 5   Orientation to Place 5 5 5 5   Registration 3 3 3 3   Attention/ Calculation 5 0 0 0  Recall 3 3 3 3   Language- name 2 objects 2 0 0 0  Language- repeat 1 1 1 1   Language- follow 3 step command 3 0 3 3  Language- read & follow direction 1 0 0 0  Write a sentence 1 0 0 0  Copy design 0 0 0 0  Total score 29 17 20 20         10/06/2023    3:59 PM 09/30/2022    3:14 PM 09/24/2021    3:09 PM  6CIT Screen  What Year? 0 points 0 points 0 points  What month? 0 points 0 points 0 points  What time? 0 points 0 points 0 points  Count back from 20 0 points 0 points 0 points  Months in reverse 0 points 2 points 0 points  Repeat phrase 0 points 0 points 0 points  Total Score 0 points 2 points 0 points    Immunizations Immunization History  Administered Date(s) Administered   Fluad Quad(high Dose 65+) 03/21/2019, 03/31/2020, 02/03/2022   Fluad Trivalent(High Dose 65+) 04/19/2023   Influenza Split  02/15/2013   Influenza, High Dose Seasonal PF 02/02/2014, 01/21/2015, 01/25/2017, 07/15/2018   Influenza, Seasonal, Injecte, Preservative Fre 01/02/2016   Influenza-Unspecified 01/25/2017, 04/17/2021   PFIZER(Purple Top)SARS-COV-2 Vaccination 06/29/2019, 07/20/2019   PNEUMOCOCCAL CONJUGATE-20 12/02/2022   Pneumococcal Conjugate-13 02/02/2014   Pneumococcal Polysaccharide-23 01/25/2017   Pneumococcal-Unspecified 02/19/2013   Td 11/05/2013   Tdap 02/20/2015   Zoster Recombinant(Shingrix) 01/25/2017   Zoster, Live 12/27/2012    Screening Tests Health Maintenance  Topic Date Due   Zoster Vaccines- Shingrix (2 of 2) 03/22/2017   COVID-19 Vaccine (3 - 2024-25 season) 01/16/2023   INFLUENZA VACCINE  12/16/2023   Medicare Annual Wellness (AWV)  10/05/2024   DTaP/Tdap/Td (3 - Td or Tdap) 02/19/2025  Pneumonia Vaccine 50+ Years old  Completed   DEXA SCAN  Completed   Hepatitis C Screening  Completed   HPV VACCINES  Aged Out   Meningococcal B Vaccine  Aged Out   Colonoscopy  Discontinued    Health Maintenance  Health Maintenance Due  Topic Date Due   Zoster Vaccines- Shingrix (2 of 2) 03/22/2017   COVID-19 Vaccine (3 - 2024-25 season) 01/16/2023   Health Maintenance Items Addressed:   Additional Screening:  Vision Screening: Recommended annual ophthalmology exams for early detection of glaucoma and other disorders of the eye.  Dental Screening: Recommended annual dental exams for proper oral hygiene  Community Resource Referral / Chronic Care Management: CRR required this visit?  No   CCM required this visit?  No   Plan:    I have personally reviewed and noted the following in the patient's chart:   Medical and social history Use of alcohol , tobacco or illicit drugs  Current medications and supplements including opioid prescriptions. Patient is not currently taking opioid prescriptions. Functional ability and status Nutritional status Physical activity Advanced  directives List of other physicians Hospitalizations, surgeries, and ER visits in previous 12 months Vitals Screenings to include cognitive, depression, and falls Referrals and appointments  In addition, I have reviewed and discussed with patient certain preventive protocols, quality metrics, and best practice recommendations. A written personalized care plan for preventive services as well as general preventive health recommendations were provided to patient.   Dewayne Ford, LPN   1/61/0960   After Visit Summary: (MyChart) Due to this being a telephonic visit, the after visit summary with patients personalized plan was offered to patient via MyChart   Notes: Nothing significant to report at this time.

## 2023-10-06 NOTE — Patient Instructions (Addendum)
 Kristie Byrd , Thank you for taking time out of your busy schedule to complete your Annual Wellness Visit with me. I enjoyed our conversation and look forward to speaking with you again next year. I, as well as your care team,  appreciate your ongoing commitment to your health goals. Please review the following plan we discussed and let me know if I can assist you in the future. Your Game plan/ To Do List    Referrals: If you haven't heard from the office you've been referred to, please reach out to them at the phone provided.   Follow up Visits: Next Medicare AWV with our clinical staff: 10/12/24 @ 12:40p   Have you seen your provider in the last 6 months (3 months if uncontrolled diabetes)?  Next Office Visit with your provider: 12/07/23 @ 8a  Clinician Recommendations:  Aim for 30 minutes of exercise or brisk walking, 6-8 glasses of water, and 5 servings of fruits and vegetables each day.       This is a list of the screening recommended for you and due dates:  Health Maintenance  Topic Date Due   Zoster (Shingles) Vaccine (2 of 2) 03/22/2017   COVID-19 Vaccine (3 - 2024-25 season) 01/16/2023   Flu Shot  12/16/2023   Medicare Annual Wellness Visit  10/05/2024   DTaP/Tdap/Td vaccine (3 - Td or Tdap) 02/19/2025   Pneumonia Vaccine  Completed   DEXA scan (bone density measurement)  Completed   Hepatitis C Screening  Completed   HPV Vaccine  Aged Out   Meningitis B Vaccine  Aged Out   Colon Cancer Screening  Discontinued    Advanced directives: (Declined) Advance directive discussed with you today. Even though you declined this today, please call our office should you change your mind, and we can give you the proper paperwork for you to fill out. Advance Care Planning is important because it:  [x]  Makes sure you receive the medical care that is consistent with your values, goals, and preferences  [x]  It provides guidance to your family and loved ones and reduces their decisional burden  about whether or not they are making the right decisions based on your wishes.  Follow the link provided in your after visit summary or read over the paperwork we have mailed to you to help you started getting your Advance Directives in place. If you need assistance in completing these, please reach out to us  so that we can help you!  See attachments for Preventive Care and Fall Prevention Tips.

## 2023-10-23 ENCOUNTER — Other Ambulatory Visit: Payer: Self-pay | Admitting: Internal Medicine

## 2023-10-25 NOTE — Telephone Encounter (Signed)
 Requested medications are due for refill today.  yes  Requested medications are on the active medications list.  yes  Last refill. 05/30/2023 3 3 rf  Future visit scheduled.   yes  Notes to clinic.  Medication not assigned to a protocol. Please review for refill.    Requested Prescriptions  Pending Prescriptions Disp Refills   BREZTRI  AEROSPHERE 160-9-4.8 MCG/ACT AERO inhaler [Pharmacy Med Name: Breztri  Aerosphere Inhalation Aerosol 160-9-4.8 MCG/ACT] 3 each 3    Sig: INHALE 2 PUFFS TWICE DAILY     Off-Protocol Failed - 10/25/2023  9:02 AM      Failed - Medication not assigned to a protocol, review manually.      Passed - Valid encounter within last 12 months    Recent Outpatient Visits   None     Future Appointments             In 1 month Baity, Rankin Buzzard, NP [facility], [facility]

## 2023-12-01 ENCOUNTER — Other Ambulatory Visit: Payer: Self-pay | Admitting: Internal Medicine

## 2023-12-01 DIAGNOSIS — F32A Depression, unspecified: Secondary | ICD-10-CM

## 2023-12-01 DIAGNOSIS — F419 Anxiety disorder, unspecified: Secondary | ICD-10-CM

## 2023-12-01 DIAGNOSIS — R251 Tremor, unspecified: Secondary | ICD-10-CM

## 2023-12-02 NOTE — Telephone Encounter (Signed)
 Requested Prescriptions  Pending Prescriptions Disp Refills   buPROPion  (WELLBUTRIN  XL) 300 MG 24 hr tablet [Pharmacy Med Name: BUPROPION  HYDROCHLORIDE ER (XL) 300 MG Oral Tablet Extended Release 24 Hour] 90 tablet 0    Sig: TAKE 1 TABLET EVERY DAY     Psychiatry: Antidepressants - bupropion  Failed - 12/02/2023  3:08 PM      Failed - Cr in normal range and within 360 days    Creat  Date Value Ref Range Status  06/06/2023 1.29 (H) 0.60 - 1.00 mg/dL Final   Creatinine, Ser  Date Value Ref Range Status  07/26/2023 1.23 (H) 0.44 - 1.00 mg/dL Final         Passed - AST in normal range and within 360 days    AST  Date Value Ref Range Status  06/06/2023 14 10 - 35 U/L Final   SGOT(AST)  Date Value Ref Range Status  02/25/2014 15 15 - 37 Unit/L Final         Passed - ALT in normal range and within 360 days    ALT  Date Value Ref Range Status  06/06/2023 11 6 - 29 U/L Final   SGPT (ALT)  Date Value Ref Range Status  02/25/2014 23 U/L Final    Comment:    14-63 NOTE: New Reference Range 12/04/13          Passed - Completed PHQ-2 or PHQ-9 in the last 360 days      Passed - Last BP in normal range    BP Readings from Last 1 Encounters:  07/26/23 119/61         Passed - Valid encounter within last 6 months    Recent Outpatient Visits   None     Future Appointments             In 5 days Antonette Angeline ORN, NP [facility]             sertraline  (ZOLOFT ) 100 MG tablet [Pharmacy Med Name: SERTRALINE  HYDROCHLORIDE 100 MG Oral Tablet] 90 tablet 0    Sig: TAKE 1 TABLET EVERY DAY     Psychiatry:  Antidepressants - SSRI - sertraline  Passed - 12/02/2023  3:08 PM      Passed - AST in normal range and within 360 days    AST  Date Value Ref Range Status  06/06/2023 14 10 - 35 U/L Final   SGOT(AST)  Date Value Ref Range Status  02/25/2014 15 15 - 37 Unit/L Final         Passed - ALT in normal range and within 360 days    ALT  Date  Value Ref Range Status  06/06/2023 11 6 - 29 U/L Final   SGPT (ALT)  Date Value Ref Range Status  02/25/2014 23 U/L Final    Comment:    14-63 NOTE: New Reference Range 12/04/13          Passed - Completed PHQ-2 or PHQ-9 in the last 360 days      Passed - Valid encounter within last 6 months    Recent Outpatient Visits   None     Future Appointments             In 5 days Baity, Angeline ORN, NP  [facility], [facility]             propranolol  (INDERAL ) 40 MG tablet [Pharmacy Med Name: PROPRANOLOL  HYDROCHLORIDE 40 MG Oral Tablet] 180  tablet 0    Sig: TAKE 1 TABLET TWICE DAILY     Cardiovascular:  Beta Blockers Passed - 12/02/2023  3:08 PM      Passed - Last BP in normal range    BP Readings from Last 1 Encounters:  07/26/23 119/61         Passed - Last Heart Rate in normal range    Pulse Readings from Last 1 Encounters:  07/26/23 70         Passed - Valid encounter within last 6 months    Recent Outpatient Visits   None     Future Appointments             In 5 days Baity, Angeline ORN, NP [facility], [facility]

## 2023-12-07 ENCOUNTER — Ambulatory Visit (INDEPENDENT_AMBULATORY_CARE_PROVIDER_SITE_OTHER): Payer: Self-pay | Admitting: Internal Medicine

## 2023-12-07 ENCOUNTER — Encounter: Payer: Self-pay | Admitting: Internal Medicine

## 2023-12-07 VITALS — BP 128/72 | HR 66 | Ht 65.0 in | Wt 220.6 lb

## 2023-12-07 DIAGNOSIS — Z0001 Encounter for general adult medical examination with abnormal findings: Secondary | ICD-10-CM

## 2023-12-07 DIAGNOSIS — R7303 Prediabetes: Secondary | ICD-10-CM

## 2023-12-07 DIAGNOSIS — Z1231 Encounter for screening mammogram for malignant neoplasm of breast: Secondary | ICD-10-CM | POA: Diagnosis not present

## 2023-12-07 DIAGNOSIS — E66812 Obesity, class 2: Secondary | ICD-10-CM

## 2023-12-07 DIAGNOSIS — E781 Pure hyperglyceridemia: Secondary | ICD-10-CM

## 2023-12-07 DIAGNOSIS — Z6836 Body mass index (BMI) 36.0-36.9, adult: Secondary | ICD-10-CM

## 2023-12-07 MED ORDER — ALBUTEROL SULFATE HFA 108 (90 BASE) MCG/ACT IN AERS: 2.0000 | INHALATION_SPRAY | Freq: Four times a day (QID) | RESPIRATORY_TRACT | 0 refills | Status: DC | PRN

## 2023-12-07 NOTE — Assessment & Plan Note (Signed)
 Encouraged diet and exercise for weight loss ?

## 2023-12-07 NOTE — Patient Instructions (Signed)
 Health Maintenance for Postmenopausal Women Menopause is a normal process in which your ability to get pregnant comes to an end. This process happens slowly over many months or years, usually between the ages of 24 and 62. Menopause is complete when you have missed your menstrual period for 12 months. It is important to talk with your health care provider about some of the most common conditions that affect women after menopause (postmenopausal women). These include heart disease, cancer, and bone loss (osteoporosis). Adopting a healthy lifestyle and getting preventive care can help to promote your health and wellness. The actions you take can also lower your chances of developing some of these common conditions. What are the signs and symptoms of menopause? During menopause, you may have the following symptoms: Hot flashes. These can be moderate or severe. Night sweats. Decrease in sex drive. Mood swings. Headaches. Tiredness (fatigue). Irritability. Memory problems. Problems falling asleep or staying asleep. Talk with your health care provider about treatment options for your symptoms. Do I need hormone replacement therapy? Hormone replacement therapy is effective in treating symptoms that are caused by menopause, such as hot flashes and night sweats. Hormone replacement carries certain risks, especially as you become older. If you are thinking about using estrogen or estrogen with progestin, discuss the benefits and risks with your health care provider. How can I reduce my risk for heart disease and stroke? The risk of heart disease, heart attack, and stroke increases as you age. One of the causes may be a change in the body's hormones during menopause. This can affect how your body uses dietary fats, triglycerides, and cholesterol. Heart attack and stroke are medical emergencies. There are many things that you can do to help prevent heart disease and stroke. Watch your blood pressure High  blood pressure causes heart disease and increases the risk of stroke. This is more likely to develop in people who have high blood pressure readings or are overweight. Have your blood pressure checked: Every 3-5 years if you are 50-75 years of age. Every year if you are 77 years old or older. Eat a healthy diet  Eat a diet that includes plenty of vegetables, fruits, low-fat dairy products, and lean protein. Do not eat a lot of foods that are high in solid fats, added sugars, or sodium. Get regular exercise Get regular exercise. This is one of the most important things you can do for your health. Most adults should: Try to exercise for at least 150 minutes each week. The exercise should increase your heart rate and make you sweat (moderate-intensity exercise). Try to do strengthening exercises at least twice each week. Do these in addition to the moderate-intensity exercise. Spend less time sitting. Even light physical activity can be beneficial. Other tips Work with your health care provider to achieve or maintain a healthy weight. Do not use any products that contain nicotine or tobacco. These products include cigarettes, chewing tobacco, and vaping devices, such as e-cigarettes. If you need help quitting, ask your health care provider. Know your numbers. Ask your health care provider to check your cholesterol and your blood sugar (glucose). Continue to have your blood tested as directed by your health care provider. Do I need screening for cancer? Depending on your health history and family history, you may need to have cancer screenings at different stages of your life. This may include screening for: Breast cancer. Cervical cancer. Lung cancer. Colorectal cancer. What is my risk for osteoporosis? After menopause, you may be  at increased risk for osteoporosis. Osteoporosis is a condition in which bone destruction happens more quickly than new bone creation. To help prevent osteoporosis or  the bone fractures that can happen because of osteoporosis, you may take the following actions: If you are 61-3 years old, get at least 1,000 mg of calcium and at least 600 international units (IU) of vitamin D per day. If you are older than age 61 but younger than age 75, get at least 1,200 mg of calcium and at least 600 international units (IU) of vitamin D per day. If you are older than age 62, get at least 1,200 mg of calcium and at least 800 international units (IU) of vitamin D per day. Smoking and drinking excessive alcohol increase the risk of osteoporosis. Eat foods that are rich in calcium and vitamin D, and do weight-bearing exercises several times each week as directed by your health care provider. How does menopause affect my mental health? Depression may occur at any age, but it is more common as you become older. Common symptoms of depression include: Feeling depressed. Changes in sleep patterns. Changes in appetite or eating patterns. Feeling an overall lack of motivation or enjoyment of activities that you previously enjoyed. Frequent crying spells. Talk with your health care provider if you think that you are experiencing any of these symptoms. General instructions See your health care provider for regular wellness exams and vaccines. This may include: Scheduling regular health, dental, and eye exams. Getting and maintaining your vaccines. These include: Influenza vaccine. Get this vaccine each year before the flu season begins. Pneumonia vaccine. Shingles vaccine. Tetanus, diphtheria, and pertussis (Tdap) booster vaccine. Your health care provider may also recommend other immunizations. Tell your health care provider if you have ever been abused or do not feel safe at home. Summary Menopause is a normal process in which your ability to get pregnant comes to an end. This condition causes hot flashes, night sweats, decreased interest in sex, mood swings, headaches, or lack  of sleep. Treatment for this condition may include hormone replacement therapy. Take actions to keep yourself healthy, including exercising regularly, eating a healthy diet, watching your weight, and checking your blood pressure and blood sugar levels. Get screened for cancer and depression. Make sure that you are up to date with all your vaccines. This information is not intended to replace advice given to you by your health care provider. Make sure you discuss any questions you have with your health care provider. Document Revised: 09/22/2020 Document Reviewed: 09/22/2020 Elsevier Patient Education  2024 [employer].

## 2023-12-07 NOTE — Progress Notes (Signed)
 Subjective:    Patient ID: Kristie Byrd, female    DOB: Mar 24, 1946, 78 y.o.   MRN: [id number]  HPI  Patient presents to clinic today for her annual exam.  Flu: 04/2023 Tetanus: 02/2015 COVID: X 2 Pneumovax: 01/2017 Prevnar 20: 11/2022 Zostavax: 12/2012 Shingrix: 01/2017, does not want to get the 2nd (1st one made her sick) Pap smear: 06/2016 Mammogram: 11/2022 Bone density: 09/2020 Colon screening: 01/2013 Vision screening: annually Dentist: as needed, dentures  Diet: She does eat lean meat. She consumes some fruits and veggies. She tries to avoid fried foods. She drinks mostly tea and lemonade, coffee Exercise: None   Review of Systems     Past Medical History:  Diagnosis Date   Arthritis    CKD (chronic kidney disease), stage III (HCC)    Colon polyps    COPD (chronic obstructive pulmonary disease) (HCC)    Hypertension    Iron  deficiency anemia 09/27/2012   PONV (postoperative nausea and vomiting)    Shingles    Sleep apnea    starting CPAP in 2024   Uses walker    or cane   Wears dentures    full upper and lower   Wears hearing aid in right ear     Current Outpatient Medications  Medication Sig Dispense Refill   acetaminophen  (TYLENOL ) 650 MG CR tablet Take 1,000 mg by mouth every 4 (four) hours as needed.     albuterol  (PROVENTIL ) (2.5 MG/3ML) 0.083% nebulizer solution Take 3 mLs (2.5 mg total) by nebulization every 6 (six) hours as needed for wheezing or shortness of breath. 150 mL 1   atorvastatin  (LIPITOR) 10 MG tablet Take 1 tablet (10 mg total) by mouth daily. 90 tablet 1   budesonide-glycopyrrolate -formoterol (BREZTRI  AEROSPHERE) 160-9-4.8 MCG/ACT AERO inhaler INHALE 2 PUFFS TWICE DAILY 3 each 0   buPROPion  (WELLBUTRIN  XL) 300 MG 24 hr tablet TAKE 1 TABLET EVERY DAY 90 tablet 0   calcitRIOL (ROCALTROL) 0.25 MCG capsule Take 0.25 mcg by mouth daily.     Cholecalciferol  (VITAMIN D3) 1000 units CAPS Take 1,000 Units by mouth daily.     ferrous sulfate  325  (65 FE) MG EC tablet Take 325 mg by mouth 3 (three) times daily with meals.     lidocaine  (LIDODERM ) 5 % PLACE 1 PATCH ONTO THE SKIN DAILY. REMOVE AND DISCARD PATCH WITHIN 12 HOURS OR AS DIRECTED BY MD 60 patch 2   lisinopril  (ZESTRIL ) 20 MG tablet TAKE 1 TABLET EVERY DAY 90 tablet 1   Multiple Vitamin (MULTIVITAMIN) tablet Take 1 tablet by mouth at bedtime.      propranolol  (INDERAL ) 40 MG tablet TAKE 1 TABLET TWICE DAILY 180 tablet 0   sertraline  (ZOLOFT ) 100 MG tablet TAKE 1 TABLET EVERY DAY 90 tablet 0   No current facility-administered medications for this visit.    Allergies  Allergen Reactions   Codeine Nausea And Vomiting   Demerol [Meperidine] Nausea And Vomiting   Fleet Phospho-Soda [Sodium Phosphates] Nausea And Vomiting    Vomiting    Latex Rash    Blisters   Other Other (See Comments)    Band-Aid, blister   Prednisone Other (See Comments)    Chest pressure    Family History  Problem Relation Age of Onset   Colon cancer Daughter     Social History   Socioeconomic History   Marital status: Divorced    Spouse name: Not on file   Number of children: 2   Years of education: Not  on file   Highest education level: Not on file  Occupational History   Occupation: retired  Tobacco Use   Smoking status: Former    Current packs/day: 0.00    Average packs/day: 1 pack/day for 37.0 years (37.0 ttl pk-yrs)    Types: Cigarettes    Start date: 11/16/1959    Quit date: 11/15/1996    Years since quitting: 27.0   Smokeless tobacco: Never  Vaping Use   Vaping status: Never Used  Substance and Sexual Activity   Alcohol  use: No    Alcohol /week: 0.0 standard drinks of alcohol    Drug use: No    Comment: uses CBD oil    Sexual activity: Never  Other Topics Concern   Not on file  Social History Narrative   Lives alone.  Divorced. Two daughters- in 44s, 3 grandchildren.   Does not know family history- raised in [facility].        Desires CPR.  Does not have  HPOA.   She would possibly want life support if for short period of time . Unsure about feeding tube.   Social Drivers of Corporate investment banker Strain: Low Risk  (10/06/2023)   Overall Financial Resource Strain (CARDIA)    Difficulty of Paying Living Expenses: Not hard at all  Food Insecurity: No Food Insecurity (10/06/2023)   Hunger Vital Sign    Worried About Running Out of Food in the Last Year: Never true    Ran Out of Food in the Last Year: Never true  Transportation Needs: No Transportation Needs (10/06/2023)   PRAPARE - Administrator, Civil Service (Medical): No    Lack of Transportation (Non-Medical): No  Physical Activity: Inactive (10/06/2023)   Exercise Vital Sign    Days of Exercise per Week: 0 days    Minutes of Exercise per Session: 0 min  Stress: No Stress Concern Present (10/06/2023)   [employer] of Occupational Health - Occupational Stress Questionnaire    Feeling of Stress : Not at all  Social Connections: Moderately Integrated (10/06/2023)   Social Connection and Isolation Panel    Frequency of Communication with Friends and Family: More than three times a week    Frequency of Social Gatherings with Friends and Family: More than three times a week    Attends Religious Services: More than 4 times per year    Active Member of [employer] or Organizations: Yes    Attends Engineer, structural: More than 4 times per year    Marital Status: Divorced  Intimate Partner Violence: Not At Risk (10/06/2023)   Humiliation, Afraid, Rape, and Kick questionnaire    Fear of Current or Ex-Partner: No    Emotionally Abused: No    Physically Abused: No    Sexually Abused: No     Constitutional:  Denies fever, malaise, headache, fatigue or abrupt weight changes.  HEENT: Denies eye pain, eye redness, ear pain, ringing in the ears, wax buildup, runny nose, nasal congestion, bloody nose, or sore throat. Respiratory: Pt reports shortness of breath with  exertion. Denies difficulty breathing, cough or sputum production.   Cardiovascular: Denies chest pain, chest tightness, palpitations or swelling in the hands or feet.  Gastrointestinal: Denies abdominal pain, bloating, constipation, diarrhea or blood in the stool.  GU: Denies urgency, frequency, pain with urination, burning sensation, blood in urine, odor or discharge. Musculoskeletal: Patient reports chronic back and knee pain, difficulty with gait.  Denies decrease in range of motion,  muscle pain or joint swelling.  Skin: Denies redness, rashes, lesions or ulcercations.  Neurological: Patient reports tremor.  Denies dizziness, difficulty with memory, difficulty with speech or problems with balance and coordination.  Psych: Patient has a history of anxiety and depression.  Denies SI/HI.  No other specific complaints in a complete review of systems (except as listed in HPI above).  Objective:   Physical Exam  BP 128/72 (BP Location: Left Arm, Patient Position: Sitting, Cuff Size: Large)   Pulse 66   Ht 5' 5 (1.651 m)   Wt 220 lb 9.6 oz (100.1 kg)   SpO2 92%   BMI 36.71 kg/m    Wt Readings from Last 3 Encounters:  10/06/23 216 lb (98 kg)  07/26/23 216 lb (98 kg)  06/06/23 220 lb 3.2 oz (99.9 kg)    General: Appears her stated age, obese, in NAD. Skin: Warm, dry and intact. Senile purpura noted. HEENT: Head: normal shape and size; Eyes: sclera white, no icterus, conjunctiva pink, PERRLA and EOMs intact;  Neck:  Neck supple, trachea midline. No masses, lumps or thyromegaly present.  Cardiovascular: Normal rate and rhythm. S1,S2 noted.  No murmur, rubs or gallops noted. No JVD or BLE edema. No carotid bruits noted. Pulmonary/Chest: Normal effort and positive vesicular breath sounds. No respiratory distress. No wheezes, rales or ronchi noted.  Abdomen: Soft and nontender. Normal bowel sounds. No distention or masses noted. Liver, spleen and kidneys non palpable. Musculoskeletal: No  pain with palpation of the spine. Joint enlargement noted in bilateral knees. Strength 5/5 BUE/BLE. Gait slow and steady with use of walker. Neurological: Alert and oriented. Cranial nerves II-XII grossly intact. Coordination normal.  Psychiatric: Mood and affect normal. Behavior is normal. Judgment and thought content normal.    BMET    Component Value Date/Time   NA 134 (L) 07/26/2023 1449   NA 137 02/25/2014 1404   K 5.1 07/26/2023 1449   K 4.2 02/25/2014 1404   CL 99 07/26/2023 1449   CL 102 02/25/2014 1404   CO2 27 07/26/2023 1449   CO2 28 02/25/2014 1404   GLUCOSE 106 (H) 07/26/2023 1449   GLUCOSE 124 (H) 02/25/2014 1404   BUN 29 (H) 07/26/2023 1449   BUN 20 02/10/2017 0000   BUN 24 (H) 02/25/2014 1404   CREATININE 1.23 (H) 07/26/2023 1449   CREATININE 1.29 (H) 06/06/2023 1441   CALCIUM  9.7 07/26/2023 1449   CALCIUM  9.7 04/06/2021 1322   GFRNONAA 46 (L) 07/26/2023 1449   GFRNONAA 43 (L) 01/26/2023 1455   GFRNONAA 44 (L) 02/25/2014 1404   GFRNONAA 45 (L) 10/26/2013 1330   GFRAA 56 (L) 10/03/2019 1324   GFRAA 54 (L) 02/25/2014 1404   GFRAA 52 (L) 10/26/2013 1330    Lipid Panel     Component Value Date/Time   CHOL 198 06/06/2023 1441   TRIG 214 (H) 06/06/2023 1441   HDL 62 06/06/2023 1441   CHOLHDL 3.2 06/06/2023 1441   VLDL 37 04/02/2020 1242   LDLCALC 103 (H) 06/06/2023 1441    CBC    Component Value Date/Time   WBC 4.9 07/26/2023 1449   WBC 5.0 06/06/2023 1441   RBC 4.08 07/26/2023 1449   HGB 11.9 (L) 07/26/2023 1449   HGB 11.4 (L) 02/25/2014 1404   HCT 38.1 07/26/2023 1449   HCT 34.9 (L) 02/25/2014 1404   PLT 243 07/26/2023 1449   PLT 256 02/25/2014 1404   MCV 93.4 07/26/2023 1449   MCV 89 02/25/2014 1404   MCH  29.2 07/26/2023 1449   MCHC 31.2 07/26/2023 1449   RDW 13.7 07/26/2023 1449   RDW 14.7 (H) 02/25/2014 1404   LYMPHSABS 0.9 07/26/2023 1449   LYMPHSABS 1.1 02/25/2014 1404   MONOABS 0.6 07/26/2023 1449   MONOABS 0.5 02/25/2014 1404    EOSABS 0.0 07/26/2023 1449   EOSABS 0.0 02/25/2014 1404   BASOSABS 0.0 07/26/2023 1449   BASOSABS 0.1 02/25/2014 1404    Hgb A1C Lab Results  Component Value Date   HGBA1C 5.6 06/06/2023           Assessment & Plan:   Preventative health maintenance:  Encouraged her to get a flu shot in the fall Tetanus UTD Encouraged her to get her COVID booster Pneumovax and Prevnar UTD Zostavax UTD She declines getting her second Shingrix vaccine She no longer needs to screen for cervical cancer Mammogram ordered-she will call to schedule Bone density UTD She no longer needs to screen for colon cancer given her age Encouraged her to consume a balanced diet and exercise regimen Advised her to see an eye doctor and dentist annually We will check CBC, c-Met, lipid, A1c today  RTC in 6 months, follow-up chronic conditions Angeline Laura, NP

## 2023-12-08 ENCOUNTER — Ambulatory Visit: Payer: Self-pay | Admitting: Internal Medicine

## 2023-12-08 LAB — CBC
HCT: 34.8 % — ABNORMAL LOW (ref 35.0–45.0)
Hemoglobin: 11.1 g/dL — ABNORMAL LOW (ref 11.7–15.5)
MCH: 29.4 pg (ref 27.0–33.0)
MCHC: 31.9 g/dL — ABNORMAL LOW (ref 32.0–36.0)
MCV: 92.3 fL (ref 80.0–100.0)
MPV: 9.8 fL (ref 7.5–12.5)
Platelets: 220 Thousand/uL (ref 140–400)
RBC: 3.77 Million/uL — ABNORMAL LOW (ref 3.80–5.10)
RDW: 13.2 % (ref 11.0–15.0)
WBC: 4 Thousand/uL (ref 3.8–10.8)

## 2023-12-08 LAB — COMPREHENSIVE METABOLIC PANEL WITH GFR
AG Ratio: 1.5 (calc) (ref 1.0–2.5)
ALT: 11 U/L (ref 6–29)
AST: 15 U/L (ref 10–35)
Albumin: 4 g/dL (ref 3.6–5.1)
Alkaline phosphatase (APISO): 75 U/L (ref 37–153)
BUN/Creatinine Ratio: 25 (calc) — ABNORMAL HIGH (ref 6–22)
BUN: 25 mg/dL (ref 7–25)
CO2: 31 mmol/L (ref 20–32)
Calcium: 9.6 mg/dL (ref 8.6–10.4)
Chloride: 104 mmol/L (ref 98–110)
Creat: 1.01 mg/dL — ABNORMAL HIGH (ref 0.60–1.00)
Globulin: 2.6 g/dL (ref 1.9–3.7)
Glucose, Bld: 94 mg/dL (ref 65–99)
Potassium: 4.9 mmol/L (ref 3.5–5.3)
Sodium: 141 mmol/L (ref 135–146)
Total Bilirubin: 0.3 mg/dL (ref 0.2–1.2)
Total Protein: 6.6 g/dL (ref 6.1–8.1)
eGFR: 57 mL/min/1.73m2 — ABNORMAL LOW (ref >=60)

## 2023-12-08 LAB — LIPID PANEL
Cholesterol: 197 mg/dL (ref <200)
HDL: 72 mg/dL (ref >=50)
LDL Cholesterol (Calc): 108 mg/dL — ABNORMAL HIGH
Non-HDL Cholesterol (Calc): 125 mg/dL (ref <130)
Total CHOL/HDL Ratio: 2.7 (calc) (ref <5.0)
Triglycerides: 81 mg/dL (ref <150)

## 2023-12-08 LAB — HEMOGLOBIN A1C
Hgb A1c MFr Bld: 5.6 % (ref <5.7)
Mean Plasma Glucose: 114 mg/dL
eAG (mmol/L): 6.3 mmol/L

## 2023-12-11 ENCOUNTER — Other Ambulatory Visit: Payer: Self-pay | Admitting: Internal Medicine

## 2023-12-13 NOTE — Telephone Encounter (Signed)
 Requested Prescriptions  Pending Prescriptions Disp Refills   lisinopril  (ZESTRIL ) 20 MG tablet [Pharmacy Med Name: LISINOPRIL  20 MG Oral Tablet] 90 tablet 1    Sig: TAKE 1 TABLET EVERY DAY     Cardiovascular:  ACE Inhibitors Failed - 12/13/2023  9:44 AM      Failed - Cr in normal range and within 180 days    Creat  Date Value Ref Range Status  12/07/2023 1.01 (H) 0.60 - 1.00 mg/dL Final         Passed - K in normal range and within 180 days    Potassium  Date Value Ref Range Status  12/07/2023 4.9 3.5 - 5.3 mmol/L Final  02/25/2014 4.2 3.5 - 5.1 mmol/L Final         Passed - Patient is not pregnant      Passed - Last BP in normal range    BP Readings from Last 1 Encounters:  12/07/23 128/72         Passed - Valid encounter within last 6 months    Recent Outpatient Visits           6 days ago Encounter for general adult medical examination with abnormal findings   [place] [facility] [place], Angeline ORN, NP

## 2023-12-29 ENCOUNTER — Telehealth: Payer: Self-pay

## 2023-12-29 NOTE — Telephone Encounter (Signed)
 Copied from CRM [id number]. Topic: Clinical - Medication Question >> Dec 29, 2023 10:27 AM Cleave MATSU wrote: Reason for CRM: pt wants to know if Dr.Baity will upgrade her albuterol  up to 90 instead of 25 days. Please follow up

## 2023-12-30 ENCOUNTER — Other Ambulatory Visit: Payer: Self-pay | Admitting: Internal Medicine

## 2024-01-02 ENCOUNTER — Telehealth: Payer: Self-pay

## 2024-01-02 NOTE — Telephone Encounter (Signed)
 Copied from CRM [id number]. Topic: Clinical - Medication Question >> Jan 02, 2024 12:21 PM DeAngela L wrote: Patient calling to check for a status update on the provider change her med refill to a 90 day refill, she has also spoke with her insurance provider and they have fax a request to the office for the med refill to be changed to 90 days also and was waiting for a response  albuterol  (VENTOLIN  HFA) 108 (90 Base) MCG/ACT inhaler     Pt num [phone]

## 2024-01-02 NOTE — Telephone Encounter (Signed)
 Requested medications are due for refill today.  unsure  Requested medications are on the active medications list.  yes  Last refill. 12/07/2023 18 0 rf  Future visit scheduled.   no  Notes to clinic.  New medication to this pt.    Requested Prescriptions  Pending Prescriptions Disp Refills   albuterol  (VENTOLIN  HFA) 108 (90 Base) MCG/ACT inhaler [Pharmacy Med Name: ALBUTEROL  SULFATE HFA 108 (90 Base) MCG/ACT Inhalation Aerosol Solution] 1 each     Sig: INHALE 2 PUFFS INTO THE LUNGS EVERY 6 HOURS AS NEEDED FOR WHEEZING OR SHORTNESS OF BREATH     Pulmonology:  Beta Agonists 2 Passed - 01/02/2024  5:22 PM      Passed - Last BP in normal range    BP Readings from Last 1 Encounters:  12/07/23 128/72         Passed - Last Heart Rate in normal range    Pulse Readings from Last 1 Encounters:  12/07/23 66         Passed - Valid encounter within last 12 months    Recent Outpatient Visits           3 weeks ago Encounter for general adult medical examination with abnormal findings   [place] [facility] [place], Angeline ORN, NP

## 2024-01-03 ENCOUNTER — Other Ambulatory Visit: Payer: Self-pay | Admitting: Internal Medicine

## 2024-01-03 DIAGNOSIS — H43813 Vitreous degeneration, bilateral: Secondary | ICD-10-CM | POA: Diagnosis not present

## 2024-01-03 DIAGNOSIS — Z961 Presence of intraocular lens: Secondary | ICD-10-CM | POA: Diagnosis not present

## 2024-01-03 DIAGNOSIS — H04123 Dry eye syndrome of bilateral lacrimal glands: Secondary | ICD-10-CM | POA: Diagnosis not present

## 2024-01-03 DIAGNOSIS — H353131 Nonexudative age-related macular degeneration, bilateral, early dry stage: Secondary | ICD-10-CM | POA: Diagnosis not present

## 2024-01-03 MED ORDER — ALBUTEROL SULFATE HFA 108 (90 BASE) MCG/ACT IN AERS
2.0000 | INHALATION_SPRAY | Freq: Four times a day (QID) | RESPIRATORY_TRACT | 0 refills | Status: DC | PRN
Start: 2024-01-03 — End: 2024-03-08

## 2024-01-03 NOTE — Telephone Encounter (Signed)
 Spoke with patient she states she is using the albuterol  neb. PRN, albuterol  inhaler 2-3 a day PRN when she gets out of breath, she takes the Brextri inhaler twice daily.

## 2024-01-03 NOTE — Telephone Encounter (Signed)
 How often is she using her albuterol  inhaler?  She also has albuterol  nebs.  This medication is supposed to be on an as-needed basis and not be used daily.  If she taken the Breztri  at prescribed

## 2024-01-03 NOTE — Telephone Encounter (Signed)
 45-month supply of albuterol  sent to pharmacy per request

## 2024-01-03 NOTE — Addendum Note (Signed)
 Addended by: ANTONETTE ANGELINE ORN on: 01/03/2024 09:44 AM   Modules accepted: Orders

## 2024-01-03 NOTE — Telephone Encounter (Signed)
 Left message for patient to return call.

## 2024-01-06 ENCOUNTER — Other Ambulatory Visit: Payer: Self-pay | Admitting: Internal Medicine

## 2024-01-06 NOTE — Telephone Encounter (Signed)
 Requested medications are due for refill today.  unsure  Requested medications are on the active medications list.  yes  Last refill. 10/25/2023 3 with 0 rf  Future visit scheduled.   no  Notes to clinic.  Not assigned to a protocol . Please review for refill.    Requested Prescriptions  Pending Prescriptions Disp Refills   BREZTRI  AEROSPHERE 160-9-4.8 MCG/ACT AERO inhaler [Pharmacy Med Name: BREZTRI  AEROSPHERE 160-9-4.8 MCG/ACT Inhalation Aerosol] 3 each 3    Sig: INHALE 2 PUFFS TWICE DAILY     Off-Protocol Failed - 01/06/2024  4:26 PM      Failed - Medication not assigned to a protocol, review manually.      Passed - Valid encounter within last 12 months    Recent Outpatient Visits           1 month ago Encounter for general adult medical examination with abnormal findings   Kristie Byrd, Kristie Byrd ORN, [place]

## 2024-01-18 ENCOUNTER — Ambulatory Visit
Admission: RE | Admit: 2024-01-18 | Discharge: 2024-01-18 | Disposition: A | Discharge status: Home / Self Care | Source: Ambulatory Visit | Attending: Internal Medicine | Admitting: Internal Medicine

## 2024-01-18 DIAGNOSIS — Z1231 Encounter for screening mammogram for malignant neoplasm of breast: Secondary | ICD-10-CM | POA: Diagnosis not present

## 2024-01-25 ENCOUNTER — Encounter: Payer: Self-pay | Admitting: Internal Medicine

## 2024-01-25 ENCOUNTER — Inpatient Hospital Stay: Attending: Internal Medicine

## 2024-01-25 ENCOUNTER — Inpatient Hospital Stay (HOSPITAL_BASED_OUTPATIENT_CLINIC_OR_DEPARTMENT_OTHER): Admitting: Internal Medicine

## 2024-01-25 ENCOUNTER — Inpatient Hospital Stay

## 2024-01-25 VITALS — BP 124/82 | HR 65 | Temp 97.8°F | Resp 16 | Ht 65.0 in | Wt 221.8 lb

## 2024-01-25 DIAGNOSIS — D631 Anemia in chronic kidney disease: Secondary | ICD-10-CM | POA: Diagnosis not present

## 2024-01-25 DIAGNOSIS — N183 Chronic kidney disease, stage 3 unspecified: Secondary | ICD-10-CM | POA: Diagnosis not present

## 2024-01-25 DIAGNOSIS — Z79899 Other long term (current) drug therapy: Secondary | ICD-10-CM | POA: Diagnosis not present

## 2024-01-25 LAB — BASIC METABOLIC PANEL - CANCER CENTER ONLY
Anion gap: 10 (ref 5–15)
BUN: 29 mg/dL — ABNORMAL HIGH (ref 8–23)
CO2: 26 mmol/L (ref 22–32)
Calcium: 9.3 mg/dL (ref 8.9–10.3)
Chloride: 98 mmol/L (ref 98–111)
Creatinine: 1.13 mg/dL — ABNORMAL HIGH (ref 0.44–1.00)
GFR, Estimated: 50 mL/min — ABNORMAL LOW (ref >60)
Glucose, Bld: 122 mg/dL — ABNORMAL HIGH (ref 70–99)
Potassium: 4.5 mmol/L (ref 3.5–5.1)
Sodium: 134 mmol/L — ABNORMAL LOW (ref 135–145)

## 2024-01-25 LAB — CBC (CANCER CENTER ONLY)
HCT: 36.1 % (ref 36.0–46.0)
Hemoglobin: 11.5 g/dL — ABNORMAL LOW (ref 12.0–15.0)
MCH: 29.3 pg (ref 26.0–34.0)
MCHC: 31.9 g/dL (ref 30.0–36.0)
MCV: 92.1 fL (ref 80.0–100.0)
Platelet Count: 221 K/uL (ref 150–400)
RBC: 3.92 MIL/uL (ref 3.87–5.11)
RDW: 13.5 % (ref 11.5–15.5)
WBC Count: 5.5 K/uL (ref 4.0–10.5)
nRBC: 0 % (ref 0.0–0.2)

## 2024-01-25 LAB — FERRITIN: Ferritin: 11 ng/mL (ref 11–307)

## 2024-01-25 LAB — IRON AND TIBC
Iron: 49 ug/dL (ref 28–170)
Saturation Ratios: 12 % (ref 10.4–31.8)
TIBC: 409 ug/dL (ref 250–450)
UIBC: 360 ug/dL

## 2024-01-25 NOTE — Assessment & Plan Note (Addendum)
#   Mild Anemia/IDA- sec to CKD stage III- ;HOLD off IV venofer ; [? cost].  Currently continue  gentle iron  one day- . Hb 11.9; but recommend TWICE-  HOLD venofer ; if not improved then venofer . Hx of relative B12 def- recommend PO B12.   # right Big Toe- trauma- no concerns of fracture- declines X-rays; recommend Tylenol  prn.   # CKD- stage III [Dr.Lateef-X]--  stable.   # DISPOSITION: #  HOLD venofer  # follow up in 6  Months- MD; labs/cbc/bmpiron studies/fertitin; b12-/possible venofer -Dr.B

## 2024-01-25 NOTE — Progress Notes (Signed)
 [facility] CONSULT NOTE  Patient Care Team: Antonette Angeline ORN, NP as PCP - General (Internal Medicine) Alaine Vicenta NOVAK, MD as Consulting Physician (Pulmonary Disease) Wilder Pink, MD as Consulting Physician (Oncology) Avram Lupita BRAVO, MD as Consulting Physician (Gastroenterology) Mittie Gaskin, MD as Referring Physician (Ophthalmology) Rennie Kristie SAUNDERS, MD as Consulting Physician (Oncology)  CHIEF COMPLAINTS/PURPOSE OF CONSULTATION: Anemia  # ANEMIA- sec to IDA/CKD [Dr.Lateef-hemoglobin 11.5/iron  saturation 13% ferritin 30; intolerant to p.o. iron .]# colonoscopy-~ 2016; [not until 2024]; STOPPED Aurexa [JAN 2023]  # CKD stage III  # Hx of smoking quit  Oncology History   No history exists.   HISTORY OF PRESENTING ILLNESS: Walks with a rolling walker because of back pain.  Alone.  Kristie Byrd 78 y.o.  female longstanding history of chronic kidney disease stage III/ongoing anemia is here for a follow up.  She thinks she may have broken her right big toe when she slipped yesterday  and would like you to look at it.  Patient compliant with her PO iron -no constipation with gentle iron .  However she ran out of it about a week ago.  No nausea no vomiting. No blood in stools or black or stools.  Patient complains of ongoing fatigue.  Previously had been on B12 injections.  Currently none  Review of Systems  Constitutional:  Positive for malaise/fatigue. Negative for chills, diaphoresis, fever and weight loss.  HENT:  Negative for nosebleeds and sore throat.   Eyes:  Negative for double vision.  Respiratory:  Negative for cough, hemoptysis, sputum production, shortness of breath and wheezing.   Cardiovascular:  Negative for chest pain, palpitations, orthopnea and leg swelling.  Gastrointestinal:  Negative for abdominal pain, blood in stool, diarrhea, heartburn, melena, nausea and vomiting.  Genitourinary:  Negative for dysuria, frequency and urgency.   Musculoskeletal:  Positive for joint pain. Negative for back pain.  Skin: Negative.  Negative for itching and rash.  Neurological:  Negative for dizziness, tingling, focal weakness, weakness and headaches.  Endo/Heme/Allergies:  Does not bruise/bleed easily.  Psychiatric/Behavioral:  Negative for depression. The patient is not nervous/anxious and does not have insomnia.      MEDICAL HISTORY:  Past Medical History:  Diagnosis Date   Arthritis    CKD (chronic kidney disease), stage III (HCC)    Colon polyps    COPD (chronic obstructive pulmonary disease) (HCC)    Hypertension    Iron  deficiency anemia 09/27/2012   PONV (postoperative nausea and vomiting)    Shingles    Sleep apnea    starting CPAP in 2024   Uses walker    or cane   Wears dentures    full upper and lower   Wears hearing aid in right ear     SURGICAL HISTORY: Past Surgical History:  Procedure Laterality Date   BREAST BIOPSY Bilateral    neg   CARPAL TUNNEL RELEASE Bilateral    CATARACT EXTRACTION W/PHACO Right 05/19/2022   Procedure: CATARACT EXTRACTION PHACO AND INTRAOCULAR LENS PLACEMENT (IOC) RIGHT  5.64  00:50.6;  Surgeon: Mittie Gaskin, MD;  Location: [facility];  Service: Ophthalmology;  Laterality: Right;  Sleep apnea   CATARACT EXTRACTION W/PHACO Left 06/02/2022   Procedure: CATARACT EXTRACTION PHACO AND INTRAOCULAR LENS PLACEMENT (IOC) LEFT 4.93 52.5;  Surgeon: Mittie Gaskin, MD;  Location: [facility];  Service: Ophthalmology;  Laterality: Left;  sleep apnea   COLONOSCOPY  multiple   KNEE ARTHROPLASTY Right 05/12/2016   Procedure: COMPUTER ASSISTED TOTAL KNEE ARTHROPLASTY;  Surgeon: Lynwood SHAUNNA Hue, MD;  Location: [facility];  Service: Orthopedics;  Laterality: Right;   KNEE SURGERY Right    NOSE SURGERY     TONSILLECTOMY      SOCIAL HISTORY: Social History   Socioeconomic History   Marital status: Divorced    Spouse name: Not on file   Number of children: 2   Years  of education: Not on file   Highest education level: Not on file  Occupational History   Occupation: retired  Tobacco Use   Smoking status: Former    Current packs/day: 0.00    Average packs/day: 1 pack/day for 37.0 years (37.0 ttl pk-yrs)    Types: Cigarettes    Start date: 11/16/1959    Quit date: 11/15/1996    Years since quitting: 27.2   Smokeless tobacco: Never  Vaping Use   Vaping status: Never Used  Substance and Sexual Activity   Alcohol  use: No    Alcohol /week: 0.0 standard drinks of alcohol    Drug use: No    Comment: uses CBD oil    Sexual activity: Never  Other Topics Concern   Not on file  Social History Narrative   Lives alone.  Divorced. Two daughters- in 96s, 3 grandchildren.   Does not know family history- raised in [facility].        Desires CPR.  Does not have HPOA.   She would possibly want life support if for short period of time . Unsure about feeding tube.   Social Drivers of Corporate investment banker Strain: Low Risk  (10/06/2023)   Overall Financial Resource Strain (CARDIA)    Difficulty of Paying Living Expenses: Not hard at all  Food Insecurity: No Food Insecurity (10/06/2023)   Hunger Vital Sign    Worried About Running Out of Food in the Last Year: Never true    Ran Out of Food in the Last Year: Never true  Transportation Needs: No Transportation Needs (10/06/2023)   PRAPARE - Administrator, Civil Service (Medical): No    Lack of Transportation (Non-Medical): No  Physical Activity: Inactive (10/06/2023)   Exercise Vital Sign    Days of Exercise per Week: 0 days    Minutes of Exercise per Session: 0 min  Stress: No Stress Concern Present (10/06/2023)   [employer] of Occupational Health - Occupational Stress Questionnaire    Feeling of Stress : Not at all  Social Connections: Moderately Integrated (10/06/2023)   Social Connection and Isolation Panel    Frequency of Communication with Friends and Family: More than  three times a week    Frequency of Social Gatherings with Friends and Family: More than three times a week    Attends Religious Services: More than 4 times per year    Active Member of [employer] or Organizations: Yes    Attends Engineer, structural: More than 4 times per year    Marital Status: Divorced  Intimate Partner Violence: Not At Risk (10/06/2023)   Humiliation, Afraid, Rape, and Kick questionnaire    Fear of Current or Ex-Partner: No    Emotionally Abused: No    Physically Abused: No    Sexually Abused: No    FAMILY HISTORY: Family History  Problem Relation Age of Onset   Colon cancer Daughter     ALLERGIES:  is allergic to codeine, demerol [meperidine], fleet phospho-soda [sodium phosphates], latex, other, and prednisone.  MEDICATIONS:  Current Outpatient Medications  Medication Sig Dispense Refill  acetaminophen  (TYLENOL ) 650 MG CR tablet Take 1,000 mg by mouth every 4 (four) hours as needed.     albuterol  (PROVENTIL ) (2.5 MG/3ML) 0.083% nebulizer solution Take 3 mLs (2.5 mg total) by nebulization every 6 (six) hours as needed for wheezing or shortness of breath. 150 mL 1   albuterol  (VENTOLIN  HFA) 108 (90 Base) MCG/ACT inhaler Inhale 2 puffs into the lungs every 6 (six) hours as needed for wheezing or shortness of breath. 54 g 0   BREZTRI  AEROSPHERE 160-9-4.8 MCG/ACT AERO inhaler INHALE 2 PUFFS TWICE DAILY 3 each 3   buPROPion  (WELLBUTRIN  XL) 300 MG 24 hr tablet TAKE 1 TABLET EVERY DAY 90 tablet 0   calcitRIOL (ROCALTROL) 0.25 MCG capsule Take 0.25 mcg by mouth daily.     lisinopril  (ZESTRIL ) 20 MG tablet TAKE 1 TABLET EVERY DAY 90 tablet 1   Multiple Vitamin (MULTIVITAMIN) tablet Take 1 tablet by mouth at bedtime.      propranolol  (INDERAL ) 40 MG tablet TAKE 1 TABLET TWICE DAILY 180 tablet 0   sertraline  (ZOLOFT ) 100 MG tablet TAKE 1 TABLET EVERY DAY 90 tablet 0   No current facility-administered medications for this visit.      SABRA  PHYSICAL  EXAMINATION: ECOG PERFORMANCE STATUS: 1 - Symptomatic but completely ambulatory  Vitals:   01/25/24 1425  BP: 124/82  Pulse: 65  Resp: 16  Temp: 97.8 F (36.6 C)  SpO2: 94%    Filed Weights   01/25/24 1425  Weight: 221 lb 12.8 oz (100.6 kg)   Blue discoloration of the great toe.  Movements restricted because of swelling and pain.  Physical Exam HENT:     Head: Normocephalic and atraumatic.     Mouth/Throat:     Pharynx: No oropharyngeal exudate.  Eyes:     Pupils: Pupils are equal, round, and reactive to light.  Cardiovascular:     Rate and Rhythm: Normal rate and regular rhythm.  Pulmonary:     Effort: No respiratory distress.     Breath sounds: No wheezing.  Abdominal:     General: Bowel sounds are normal. There is no distension.     Palpations: Abdomen is soft. There is no mass.     Tenderness: There is no abdominal tenderness. There is no guarding or rebound.  Musculoskeletal:        General: No tenderness. Normal range of motion.     Cervical back: Normal range of motion and neck supple.  Skin:    General: Skin is warm.  Neurological:     Mental Status: She is alert and oriented to person, place, and time.  Psychiatric:        Mood and Affect: Affect normal.      LABORATORY DATA:  I have reviewed the data as listed Lab Results  Component Value Date   WBC 5.5 01/25/2024   HGB 11.5 (L) 01/25/2024   HCT 36.1 01/25/2024   MCV 92.1 01/25/2024   PLT 221 01/25/2024   Recent Labs    01/26/23 1455 06/06/23 1441 07/26/23 1449 12/07/23 0908 01/25/24 1420  NA 133* 140 134* 141 134*  K 4.7 5.0 5.1 4.9 4.5  CL 101 103 99 104 98  CO2 25 31 27 31 26   GLUCOSE 101* 90 106* 94 122*  BUN 36* 26* 29* 25 29*  CREATININE 1.28* 1.29* 1.23* 1.01* 1.13*  CALCIUM  9.5 9.7 9.7 9.6 9.3  GFRNONAA 43*  --  46*  --  50*  PROT  --  6.3  --  6.6  --   AST  --  14  --  15  --   ALT  --  11  --  11  --   BILITOT  --  0.3  --  0.3  --     RADIOGRAPHIC STUDIES: I have  personally reviewed the radiological images as listed and agreed with the findings in the report. MM 3D SCREENING MAMMOGRAM BILATERAL BREAST Result Date: 01/23/2024 CLINICAL DATA:  Screening. EXAM: DIGITAL SCREENING BILATERAL MAMMOGRAM WITH TOMOSYNTHESIS AND CAD TECHNIQUE: Bilateral screening digital craniocaudal and mediolateral oblique mammograms were obtained. Bilateral screening digital breast tomosynthesis was performed. The images were evaluated with computer-aided detection. COMPARISON:  Previous exam(s). ACR Breast Density Category b: There are scattered areas of fibroglandular density. FINDINGS: There are no findings suspicious for malignancy. IMPRESSION: No mammographic evidence of malignancy. A result letter of this screening mammogram will be mailed directly to the patient. RECOMMENDATION: Screening mammogram in one year. (Code:SM-B-01Y) BI-RADS CATEGORY  1: Negative. Electronically Signed   By: Alm Parkins M.D.   On: 01/23/2024 15:46    ASSESSMENT & PLAN:   Anemia of chronic kidney failure, stage 3 (moderate) # Mild Anemia/IDA- sec to CKD stage III- ;HOLD off IV venofer ; [? cost].  Currently continue  gentle iron  one day- . Hb 11.9; but recommend TWICE-  HOLD venofer ; if not improved then venofer . Hx of relative B12 def- recommend PO B12.   # right Big Toe- trauma- no concerns of fracture- declines X-rays; recommend Tylenol  prn.   # CKD- stage III [Dr.Lateef-X]--  stable.   # DISPOSITION: #  HOLD venofer  # follow up in 6  Months- MD; labs/cbc/bmpiron studies/fertitin; b12-/possible venofer -Dr.B     All questions were answered. The patient knows to call the clinic with any problems, questions or concerns.    Kristie JONELLE Joe, MD 01/25/2024 3:46 PM

## 2024-01-25 NOTE — Patient Instructions (Signed)
#   Recommend B12 sublingual 1000 mcg once a day [under the tongue; helps with better absorption].

## 2024-01-25 NOTE — Progress Notes (Signed)
 Fatigue/weakness: YES Dyspena: YES Light headedness: AT TIMES Blood in stool: NO  She thinks she may have broken her right big toe when she slipped yesterday  and would like you to look at it.

## 2024-02-06 ENCOUNTER — Other Ambulatory Visit: Payer: Self-pay | Admitting: Internal Medicine

## 2024-02-06 DIAGNOSIS — F419 Anxiety disorder, unspecified: Secondary | ICD-10-CM

## 2024-02-07 NOTE — Telephone Encounter (Signed)
 Requested Prescriptions  Pending Prescriptions Disp Refills   buPROPion  (WELLBUTRIN  XL) 300 MG 24 hr tablet [Pharmacy Med Name: BUPROPION  HYDROCHLORIDE ER (XL) 300 MG Oral Tablet Extended Release 24 Hour] 90 tablet 1    Sig: TAKE 1 TABLET EVERY DAY     Psychiatry: Antidepressants - bupropion  Failed - 02/07/2024  9:41 AM      Failed - Cr in normal range and within 360 days    Creatinine  Date Value Ref Range Status  01/25/2024 1.13 (H) 0.44 - 1.00 mg/dL Final   Creat  Date Value Ref Range Status  12/07/2023 1.01 (H) 0.60 - 1.00 mg/dL Final         Passed - AST in normal range and within 360 days    AST  Date Value Ref Range Status  12/07/2023 15 10 - 35 U/L Final   SGOT(AST)  Date Value Ref Range Status  02/25/2014 15 15 - 37 Unit/L Final         Passed - ALT in normal range and within 360 days    ALT  Date Value Ref Range Status  12/07/2023 11 6 - 29 U/L Final   SGPT (ALT)  Date Value Ref Range Status  02/25/2014 23 U/L Final    Comment:    14-63 NOTE: New Reference Range 12/04/13          Passed - Completed PHQ-2 or PHQ-9 in the last 360 days      Passed - Last BP in normal range    BP Readings from Last 1 Encounters:  01/25/24 124/82         Passed - Valid encounter within last 6 months    Recent Outpatient Visits           2 months ago Encounter for general adult medical examination with abnormal findings   [place] [facility] [place], Angeline ORN, NP               sertraline  (ZOLOFT ) 100 MG tablet [Pharmacy Med Name: SERTRALINE  HYDROCHLORIDE 100 MG Oral Tablet] 90 tablet 1    Sig: TAKE 1 TABLET EVERY DAY     Psychiatry:  Antidepressants - SSRI - sertraline  Passed - 02/07/2024  9:41 AM      Passed - AST in normal range and within 360 days    AST  Date Value Ref Range Status  12/07/2023 15 10 - 35 U/L Final   SGOT(AST)  Date Value Ref Range Status  02/25/2014 15 15 - 37 Unit/L Final         Passed - ALT in normal range and  within 360 days    ALT  Date Value Ref Range Status  12/07/2023 11 6 - 29 U/L Final   SGPT (ALT)  Date Value Ref Range Status  02/25/2014 23 U/L Final    Comment:    14-63 NOTE: New Reference Range 12/04/13          Passed - Completed PHQ-2 or PHQ-9 in the last 360 days      Passed - Valid encounter within last 6 months    Recent Outpatient Visits           2 months ago Encounter for general adult medical examination with abnormal findings   [place] [facility] [place], Angeline ORN, [place]

## 2024-02-17 ENCOUNTER — Other Ambulatory Visit: Payer: Self-pay | Admitting: Internal Medicine

## 2024-02-17 DIAGNOSIS — R251 Tremor, unspecified: Secondary | ICD-10-CM

## 2024-02-20 DIAGNOSIS — I10 Essential (primary) hypertension: Secondary | ICD-10-CM | POA: Diagnosis not present

## 2024-02-20 DIAGNOSIS — N2581 Secondary hyperparathyroidism of renal origin: Secondary | ICD-10-CM | POA: Diagnosis not present

## 2024-02-20 DIAGNOSIS — D631 Anemia in chronic kidney disease: Secondary | ICD-10-CM | POA: Diagnosis not present

## 2024-02-20 DIAGNOSIS — N1832 Chronic kidney disease, stage 3b: Secondary | ICD-10-CM | POA: Diagnosis not present

## 2024-02-20 NOTE — Telephone Encounter (Signed)
 Requested Prescriptions  Pending Prescriptions Disp Refills   propranolol  (INDERAL ) 40 MG tablet [Pharmacy Med Name: PROPRANOLOL  HYDROCHLORIDE 40 MG Oral Tablet] 180 tablet 0    Sig: TAKE 1 TABLET TWICE DAILY     Cardiovascular:  Beta Blockers Passed - 02/20/2024  7:39 AM      Passed - Last BP in normal range    BP Readings from Last 1 Encounters:  01/25/24 124/82         Passed - Last Heart Rate in normal range    Pulse Readings from Last 1 Encounters:  01/25/24 65         Passed - Valid encounter within last 6 months    Recent Outpatient Visits           2 months ago Encounter for general adult medical examination with abnormal findings   [place] [facility] [place], Angeline ORN, NP

## 2024-03-05 ENCOUNTER — Ambulatory Visit: Payer: Self-pay

## 2024-03-05 NOTE — Telephone Encounter (Signed)
 Will discuss at upcoming appointment

## 2024-03-05 NOTE — Telephone Encounter (Signed)
 FYI Only or Action Required?: FYI only for provider.  Patient was last seen in primary care on 12/07/2023 by Antonette Angeline ORN, NP.  Called Nurse Triage reporting Toe Injury.  Symptoms began about a month ago.  Interventions attempted: Rest, hydration, or home remedies and Other: Different footwear.  Symptoms are: gradually improving.  Triage Disposition: See PCP When Office is Open (Within 3 Days)  Patient/caregiver understands and will follow disposition?: No, refuses disposition  Copied from CRM #[id number]. Topic: Clinical - Red Word Triage >> Mar 05, 2024  1:46 PM Antwanette L wrote: Red Word that prompted transfer to Nurse Triage: pt right big toe is red and swollen. Reason for Disposition  Large swelling or bruise  Answer Assessment - Initial Assessment Questions Slipped and fell on toe 1 month ago. Was not seen and evaluated by a provider for the injury, pt let it heal on it's own. Pt reports there isn't much you can do for a broken toe. Advised pt be seen in next 3 days, declines sooner OV, mobile bus or UC d/t scheduling conflicts. Reports soonest day she can be seen is 11/18 after 1200. Appt scheduled for 11/18. Advised UC or ED for worsening symptoms in the meantime.  1. MECHANISM: How did the injury happen?      Broke toe 1 month ago  2. ONSET: When did the injury happen? (e.g., minutes, hours ago)      1 month ago  3. LOCATION: What part of the toe is injured? Is the nail damaged?      Right big toe  4. APPEARANCE of TOE INJURY: What does the injury look like?      Red and swollen  5. SEVERITY: Can you use the foot normally? Can you walk?      Now able to walk normally, had to wobble initially   6. SIZE: For cuts, bruises, or swelling, ask: How large is it? (e.g., inches or centimeters;  entire toe)      Moderate redness and swelling  7. PAIN: Is there pain? If Yes, ask: How bad is the pain?   What does it keep you from doing? (Scale 0-10; or  none, mild, moderate, severe)     No pain  8. TETANUS: For any breaks in the skin, ask: When was your last tetanus booster?     N/a  9. DIABETES: Do you have a history of diabetes or poor circulation in the feet?     No  10. OTHER SYMPTOMS: Do you have any other symptoms?        Difficulty sleeping, a little wobbly when walking  Protocols used: Toe Injury-A-AH

## 2024-03-07 ENCOUNTER — Other Ambulatory Visit: Payer: Self-pay | Admitting: Internal Medicine

## 2024-03-08 NOTE — Telephone Encounter (Signed)
 Requested Prescriptions  Pending Prescriptions Disp Refills   albuterol  (VENTOLIN  HFA) 108 (90 Base) MCG/ACT inhaler [Pharmacy Med Name: ALBUTEROL  SULFATE HFA 108 (90 Base) MCG/ACT Inhalation Aerosol Solution] 3 each 0    Sig: INHALE 2 PUFFS INTO THE LUNGS EVERY 6 (SIX) HOURS AS NEEDED FOR WHEEZING OR SHORTNESS OF BREATH.     Pulmonology:  Beta Agonists 2 Passed - 03/08/2024  4:21 PM      Passed - Last BP in normal range    BP Readings from Last 1 Encounters:  01/25/24 124/82         Passed - Last Heart Rate in normal range    Pulse Readings from Last 1 Encounters:  01/25/24 65         Passed - Valid encounter within last 12 months    Recent Outpatient Visits           3 months ago Encounter for general adult medical examination with abnormal findings   [place] [facility] [place], Angeline ORN, NP

## 2024-03-12 ENCOUNTER — Encounter: Payer: Self-pay | Admitting: Internal Medicine

## 2024-04-03 ENCOUNTER — Ambulatory Visit (INDEPENDENT_AMBULATORY_CARE_PROVIDER_SITE_OTHER): Admitting: Internal Medicine

## 2024-04-03 ENCOUNTER — Encounter: Payer: Self-pay | Admitting: Internal Medicine

## 2024-04-03 VITALS — BP 124/74 | Ht 65.0 in | Wt 224.8 lb

## 2024-04-03 DIAGNOSIS — J439 Emphysema, unspecified: Secondary | ICD-10-CM | POA: Diagnosis not present

## 2024-04-03 DIAGNOSIS — K219 Gastro-esophageal reflux disease without esophagitis: Secondary | ICD-10-CM

## 2024-04-03 DIAGNOSIS — F419 Anxiety disorder, unspecified: Secondary | ICD-10-CM

## 2024-04-03 DIAGNOSIS — R413 Other amnesia: Secondary | ICD-10-CM

## 2024-04-03 DIAGNOSIS — I951 Orthostatic hypotension: Secondary | ICD-10-CM

## 2024-04-03 DIAGNOSIS — M5416 Radiculopathy, lumbar region: Secondary | ICD-10-CM | POA: Diagnosis not present

## 2024-04-03 DIAGNOSIS — R296 Repeated falls: Secondary | ICD-10-CM | POA: Diagnosis not present

## 2024-04-03 DIAGNOSIS — N950 Postmenopausal bleeding: Secondary | ICD-10-CM

## 2024-04-03 DIAGNOSIS — N39498 Other specified urinary incontinence: Secondary | ICD-10-CM

## 2024-04-03 DIAGNOSIS — M79674 Pain in right toe(s): Secondary | ICD-10-CM

## 2024-04-03 DIAGNOSIS — F32A Depression, unspecified: Secondary | ICD-10-CM

## 2024-04-03 MED ORDER — OMEPRAZOLE 20 MG PO CPDR: 20.0000 mg | DELAYED_RELEASE_CAPSULE | Freq: Every day | ORAL | 1 refills | Status: AC

## 2024-04-03 NOTE — Progress Notes (Unsigned)
 Subjective:    Patient ID: Kristie Byrd, female    DOB: Sep 12, 1945, 78 y.o.   MRN: [id number]  HPI  Discussed the use of AI scribe software for clinical note transcription with the patient, who gave verbal consent to proceed.   Kristie Byrd is a 78 year old female who presents with persistent toe swelling and pain following a suspected fracture. She is accompanied by her daughters, Tawni and Rosaline.  Approximately two to three months ago, she experienced a toe injury where her foot slid under her, causing her toe to bend underneath. She suspects a fracture due to the intense pain experienced at the time, although no x-ray was performed. Currently, the toe remains swollen and red, but there is no significant pain unless pressure is applied directly to the area. She is able to walk and put pressure on the toe without discomfort.  She experiences chronic pain throughout her body, particularly in her back and knees. She also reports dizziness and frequent falls, occurring at least once a week over the past three weeks. Her last fall resulted in visible bruising on her legs,, forearms and nose. Dizziness occurs upon standing quickly, leading to instability. She lives alone and has not been under the care of a back specialist recently, although she previously consulted an orthopedic surgeon.  She admits that certain times her pain is so intense that she urinates herself.  She occasionally takes extra strength Tylenol , which helps her sleep but is not taken regularly due to concerns about kidney health.  She experiences poor sleep quality, characterized by restlessness, frequent waking, and coughing with phlegm production. She recently transitioned from sleeping in a recliner to a bed, using two small travel pillows. She experiences pain across her ribs, especially when turning, which she suspects may be due to a previous rib fracture. She has not been using her CPAP machine due to discomfort and  has been trying Prilosec for suspected acid reflux, which seems to help.  She also has a history of COPD managed Breztri  and albuterol .  She reports memory issues and confusion, which her daughters noticed during a recent trip to the beach. She has a history of cognitive testing a few years ago.  Additionally, she experienced a single episode of vaginal spotting last week, which resolved the same day. There is a family history of cancer, although details are limited.  She has not had any reoccurrence.  She feels 'tired of the pain' and unable to perform activities she used to enjoy, indicating a possible depressive state. She is currently on Zoloft  and another unspecified medication. She is concerned about the impact of her pain on her mood and daily functioning.    Review of Systems     Past Medical History:  Diagnosis Date   Arthritis    CKD (chronic kidney disease), stage III (HCC)    Colon polyps    COPD (chronic obstructive pulmonary disease) (HCC)    Hypertension    Iron  deficiency anemia 09/27/2012   PONV (postoperative nausea and vomiting)    Shingles    Sleep apnea    starting CPAP in 2024   Uses walker    or cane   Wears dentures    full upper and lower   Wears hearing aid in right ear     Current Outpatient Medications  Medication Sig Dispense Refill   acetaminophen  (TYLENOL ) 650 MG CR tablet Take 1,000 mg by mouth every 4 (four) hours as needed.  albuterol  (PROVENTIL ) (2.5 MG/3ML) 0.083% nebulizer solution Take 3 mLs (2.5 mg total) by nebulization every 6 (six) hours as needed for wheezing or shortness of breath. 150 mL 1   albuterol  (VENTOLIN  HFA) 108 (90 Base) MCG/ACT inhaler INHALE 2 PUFFS INTO THE LUNGS EVERY 6 (SIX) HOURS AS NEEDED FOR WHEEZING OR SHORTNESS OF BREATH. 3 each 0   BREZTRI  AEROSPHERE 160-9-4.8 MCG/ACT AERO inhaler INHALE 2 PUFFS TWICE DAILY 3 each 3   buPROPion  (WELLBUTRIN  XL) 300 MG 24 hr tablet TAKE 1 TABLET EVERY DAY 90 tablet 1   calcitRIOL  (ROCALTROL) 0.25 MCG capsule Take 0.25 mcg by mouth daily.     lisinopril  (ZESTRIL ) 20 MG tablet TAKE 1 TABLET EVERY DAY 90 tablet 1   Multiple Vitamin (MULTIVITAMIN) tablet Take 1 tablet by mouth at bedtime.      propranolol  (INDERAL ) 40 MG tablet TAKE 1 TABLET TWICE DAILY 180 tablet 0   sertraline  (ZOLOFT ) 100 MG tablet TAKE 1 TABLET EVERY DAY 90 tablet 1   No current facility-administered medications for this visit.    Allergies  Allergen Reactions   Codeine Nausea And Vomiting   Demerol [Meperidine] Nausea And Vomiting   Fleet Phospho-Soda [Sodium Phosphates] Nausea And Vomiting    Vomiting    Latex Rash    Blisters   Other Other (See Comments)    Band-Aid, blister   Prednisone Other (See Comments)    Chest pressure    Family History  Problem Relation Age of Onset   Colon cancer Daughter     Social History   Socioeconomic History   Marital status: Divorced    Spouse name: Not on file   Number of children: 2   Years of education: Not on file   Highest education level: Not on file  Occupational History   Occupation: retired  Tobacco Use   Smoking status: Former    Current packs/day: 0.00    Average packs/day: 1 pack/day for 37.0 years (37.0 ttl pk-yrs)    Types: Cigarettes    Start date: 11/16/1959    Quit date: 11/15/1996    Years since quitting: 27.4   Smokeless tobacco: Never  Vaping Use   Vaping status: Never Used  Substance and Sexual Activity   Alcohol  use: No    Alcohol /week: 0.0 standard drinks of alcohol    Drug use: No    Comment: uses CBD oil    Sexual activity: Never  Other Topics Concern   Not on file  Social History Narrative   Lives alone.  Divorced. Two daughters- in 26s, 3 grandchildren.   Does not know family history- raised in [facility].        Desires CPR.  Does not have HPOA.   She would possibly want life support if for short period of time . Unsure about feeding tube.   Social Drivers of Corporate Investment Banker  Strain: Low Risk  (10/06/2023)   Overall Financial Resource Strain (CARDIA)    Difficulty of Paying Living Expenses: Not hard at all  Food Insecurity: No Food Insecurity (10/06/2023)   Hunger Vital Sign    Worried About Running Out of Food in the Last Year: Never true    Ran Out of Food in the Last Year: Never true  Transportation Needs: No Transportation Needs (10/06/2023)   PRAPARE - Administrator, Civil Service (Medical): No    Lack of Transportation (Non-Medical): No  Physical Activity: Inactive (10/06/2023)   Exercise Vital Sign  Days of Exercise per Week: 0 days    Minutes of Exercise per Session: 0 min  Stress: No Stress Concern Present (10/06/2023)   [employer] of Occupational Health - Occupational Stress Questionnaire    Feeling of Stress : Not at all  Social Connections: Moderately Integrated (10/06/2023)   Social Connection and Isolation Panel    Frequency of Communication with Friends and Family: More than three times a week    Frequency of Social Gatherings with Friends and Family: More than three times a week    Attends Religious Services: More than 4 times per year    Active Member of [employer] or Organizations: Yes    Attends Engineer, Structural: More than 4 times per year    Marital Status: Divorced  Intimate Partner Violence: Not At Risk (10/06/2023)   Humiliation, Afraid, Rape, and Kick questionnaire    Fear of Current or Ex-Partner: No    Emotionally Abused: No    Physically Abused: No    Sexually Abused: No     Constitutional:  Denies fever, malaise, headache, fatigue or abrupt weight changes.  HEENT: Denies eye pain, eye redness, ear pain, ringing in the ears, wax buildup, runny nose, nasal congestion, bloody nose, or sore throat. Respiratory: Pt reports shortness of breath with exertion. Denies difficulty breathing, cough or sputum production.   Cardiovascular: Denies chest pain, chest tightness, palpitations or swelling in the hands or  feet.  Gastrointestinal: Denies abdominal pain, bloating, constipation, diarrhea or blood in the stool.  GU: Patient reports episodic urinary incontinence.  Denies urgency, frequency, pain with urination, burning sensation, blood in urine, odor or discharge. Musculoskeletal: Patient reports chronic back and knee pain, difficulty with gait, swelling and redness of right great toe, frequent falls.  Denies decrease in range of motion, muscle pain.  Skin: Pt reports bruising to bilateral lower extremities. Denies redness, rashes, lesions or ulcercations.  Neurological: Patient reports tremor, intermittent dizziness, difficulty with balance, daughters report difficulty with memory.  Denies  difficulty with speech or problems with coordination.  Psych: Patient has a history of anxiety and depression.  Denies SI/HI.  No other specific complaints in a complete review of systems (except as listed in HPI above).  Objective:   Physical Exam  BP 124/74 (BP Location: Right Arm, Patient Position: Sitting, Cuff Size: Large)   Ht 5' 5 (1.651 m)   Wt 224 lb 12.8 oz (102 kg)   SpO2 95%   BMI 37.41 kg/m     Wt Readings from Last 3 Encounters:  01/25/24 221 lb 12.8 oz (100.6 kg)  12/07/23 220 lb 9.6 oz (100.1 kg)  10/06/23 216 lb (98 kg)    General: Appears her stated age, obese, in NAD. Skin: Warm, dry and intact.  Abrasion noted to nasal bridge.  Abrasion noted to left forearm.  Bruising noted to right forearm.  Bruising noted to bilateral lower extremities. HEENT: Head: normal shape and size; Eyes: sclera white, no icterus, conjunctiva pink, PERRLA and EOMs intact;  Neck:  Cardiovascular: Normal rate and rhythm. S1,S2 noted.  No murmur, rubs or gallops noted. No JVD or BLE edema.  Pulmonary/Chest: Normal effort and diminished vesicular breath sounds. No respiratory distress. No wheezes, rales or ronchi noted.  Musculoskeletal:  Joint enlargement noted in bilateral knees. Strength 5/5 BUE/BLE. Gait  slow and steady with use of a rolling walker. Neurological: Alert and oriented. Coordination normal.  Psychiatric: Mood and affect normal.  Tearful at times. Judgment and thought content normal.  BMET    Component Value Date/Time   NA 134 (L) 01/25/2024 1420   NA 137 02/25/2014 1404   K 4.5 01/25/2024 1420   K 4.2 02/25/2014 1404   CL 98 01/25/2024 1420   CL 102 02/25/2014 1404   CO2 26 01/25/2024 1420   CO2 28 02/25/2014 1404   GLUCOSE 122 (H) 01/25/2024 1420   GLUCOSE 124 (H) 02/25/2014 1404   BUN 29 (H) 01/25/2024 1420   BUN 20 02/10/2017 0000   BUN 24 (H) 02/25/2014 1404   CREATININE 1.13 (H) 01/25/2024 1420   CREATININE 1.01 (H) 12/07/2023 0908   CALCIUM  9.3 01/25/2024 1420   CALCIUM  9.7 04/06/2021 1322   GFRNONAA 50 (L) 01/25/2024 1420   GFRNONAA 44 (L) 02/25/2014 1404   GFRNONAA 45 (L) 10/26/2013 1330   GFRAA 56 (L) 10/03/2019 1324   GFRAA 54 (L) 02/25/2014 1404   GFRAA 52 (L) 10/26/2013 1330    Lipid Panel     Component Value Date/Time   CHOL 197 12/07/2023 0908   TRIG 81 12/07/2023 0908   HDL 72 12/07/2023 0908   CHOLHDL 2.7 12/07/2023 0908   VLDL 37 04/02/2020 1242   LDLCALC 108 (H) 12/07/2023 0908    CBC    Component Value Date/Time   WBC 5.5 01/25/2024 1420   WBC 4.0 12/07/2023 0908   RBC 3.92 01/25/2024 1420   HGB 11.5 (L) 01/25/2024 1420   HGB 11.4 (L) 02/25/2014 1404   HCT 36.1 01/25/2024 1420   HCT 34.9 (L) 02/25/2014 1404   PLT 221 01/25/2024 1420   PLT 256 02/25/2014 1404   MCV 92.1 01/25/2024 1420   MCV 89 02/25/2014 1404   MCH 29.3 01/25/2024 1420   MCHC 31.9 01/25/2024 1420   RDW 13.5 01/25/2024 1420   RDW 14.7 (H) 02/25/2014 1404   LYMPHSABS 0.9 07/26/2023 1449   LYMPHSABS 1.1 02/25/2014 1404   MONOABS 0.6 07/26/2023 1449   MONOABS 0.5 02/25/2014 1404   EOSABS 0.0 07/26/2023 1449   EOSABS 0.0 02/25/2014 1404   BASOSABS 0.0 07/26/2023 1449   BASOSABS 0.1 02/25/2014 1404    Hgb A1C Lab Results  Component Value Date    HGBA1C 5.6 12/07/2023           Assessment & Plan:   Assessment and Plan    Right toe pain and prior fracture Chronic right toe pain post-fracture with mild symptoms not requiring imaging. - Monitor symptoms for now.  No further intervention needed at this time.  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 - Order MRI of lumbar spine. - Recommend Tylenol  650 mg 2 tabs every 8 hours. - Consider follow-up with neurosurgery pending MRI results  Dizziness and recurrent falls Recurrent falls and dizziness possibly related to back issues or medication. - Order MRI of lumbar spine. - Perform orthostatic vital signs (positive).  Recommend that she make position changes slowly, consume 4 16.9 fluid ounces of water daily. - Consider reducing lisinopril  to 10 mg daily if she continues to struggle with dizziness  Postmenopausal vaginal bleeding Resolved postmenopausal bleeding with family history of cancer, concern for malignancy. - Order pelvic/transvaginal ultrasound.  Memory changes Cognitive impairment and memory loss with previous testing. - Schedule cognitive testing.  Anxiety and depression Anxiety depression likely secondary to chronic pain and functional impairment.  She does seem to feel overwhelmed at this appointment by the amount of concerns that her  daughters are raising - Encourage consistent pain management with Tylenol  650-2 tabs every 8 hours as needed. -Continue sertraline  100 mg daily for now - Discuss benefits of counseling or therapy however she would like to hold off at this time.  Chronic obstructive pulmonary disease (COPD) COPD with coughing and phlegm, possibly exacerbated by GERD. - Encourage use of Breztri  inhaler. - Continue albuterol  as needed  Gastroesophageal reflux disease (GERD) GERD with symptoms contributing  to sleep disturbances. - Continue omeprazole 20 mg daily.  Urinary incontinence Urinary incontinence possibly related to spinal impingement or back issues. - Order MRI of lumbar spine.        RTC in 2 months, follow-up chronic conditions Angeline Laura, NP

## 2024-04-04 ENCOUNTER — Encounter: Payer: Self-pay | Admitting: Internal Medicine

## 2024-04-04 NOTE — Patient Instructions (Signed)
Radicular Pain Radicular pain is a type of pain that spreads from your back or neck along a spinal nerve. Spinal nerves are nerves that leave the spinal cord and go to the muscles. Radicular pain is sometimes called radiculopathy, radiculitis, or a pinched nerve. When you have this type of pain, you may also have weakness, numbness, or tingling in the area of your body that is supplied by the nerve. The pain may feel sharp and burning. Depending on which spinal nerve is affected, the pain may occur in the: Neck area (cervical radicular pain). You may also feel pain, numbness, weakness, or tingling in the arms. Mid-spine area (thoracic radicular pain). You would feel this pain in the back and chest. This type is rare. Lower back area (lumbar radicular pain). You would feel this pain as low back pain. You may feel pain, numbness, weakness, or tingling in the buttocks or legs. Sciatica is a type of lumbar radicular pain that shoots down the back of the leg. Radicular pain occurs when one of the spinal nerves becomes irritated or squeezed (compressed). It is often caused by something pushing on a spinal nerve, such as one of the bones of the spine (vertebrae) or one of the round cushions between vertebrae (intervertebral disks). This can result from: An injury. Wear and tear or aging of a disk. The growth of a bone spur that pushes on the nerve. Radicular pain often goes away when you follow instructions from your health care provider for relieving pain at home. How is this treated? Treatment may depend on the cause of the condition and may include: Working with a physical therapist. Taking pain medicine. Applying heat or ice or both to the affected areas. Doing stretches to improve flexibility. Having surgery. This may be needed if other treatments do not help. Different types of surgery may be done depending on the cause of this condition. Follow these instructions at home: Managing pain     If  directed, put ice on the affected area. To do this: Put ice in a plastic bag. Place a towel between your skin and the bag. Leave the ice on for 20 minutes, 2-3 times a day. Remove the ice if your skin turns bright red. This is very important. If you cannot feel pain, heat, or cold, you have a greater risk of damage to the area. If directed, apply heat to the affected area as often as told by your health care provider. Use the heat source that your health care provider recommends, such as a moist heat pack or a heating pad. Place a towel between your skin and the heat source. Leave the heat on for 20-30 minutes. Remove the heat if your skin turns bright red. This is especially important if you are unable to feel pain, heat, or cold. You have a greater risk of getting burned. Activity Do not sit or rest in bed for long periods of time. Try to stay as active as possible. Ask your health care provider what type of exercise or activity is best for you. Avoid activities that make your pain worse, such as bending and lifting. You may have to avoid lifting. Ask your health care provider how much you can safely lift. Practice using proper technique when lifting items. Proper lifting technique involves bending your knees and rising up. Do strength and range-of-motion exercises only as told by your health care provider or physical therapist. General instructions Take over-the-counter and prescription medicines only as told by your  health care provider. Pay attention to any changes in your symptoms. Keep all follow-up visits. This is important. Contact a health care provider if: Your pain and other symptoms get worse. Your pain medicine is not helping. Your pain has not improved after a few weeks of home care. You have a fever. Get help right away if: You have severe pain, weakness, or numbness. You have difficulty with bladder or bowel control. Summary Radicular pain is a type of pain that spreads  from your back or neck along a spinal nerve. When you have radicular pain, you may also have weakness, numbness, or tingling in the area of your body that is supplied by the nerve. The pain may feel sharp or burning. Radicular pain may be treated with ice, heat, medicines, or physical therapy. This information is not intended to replace advice given to you by your health care provider. Make sure you discuss any questions you have with your health care provider. Document Revised: 11/06/2020 Document Reviewed: 11/06/2020 Elsevier Patient Education  2024 [employer].

## 2024-04-05 ENCOUNTER — Encounter: Payer: Self-pay | Admitting: Internal Medicine

## 2024-04-05 ENCOUNTER — Ambulatory Visit: Payer: Self-pay

## 2024-04-05 MED ORDER — DIAZEPAM 10 MG PO TABS: ORAL_TABLET | ORAL | 0 refills | Status: DC

## 2024-04-05 NOTE — Telephone Encounter (Signed)
Agreed with advice given

## 2024-04-05 NOTE — Telephone Encounter (Signed)
 FYI Only or Action Required?: FYI only for provider: advised home care.  Patient was last seen in primary care on 04/03/2024 by Antonette Angeline ORN, NP.  Called Nurse Triage reporting Nasal Congestion.  Symptoms began several days ago.  Interventions attempted: OTC medications: tylenol  extra strength.  Symptoms are: stable.  Triage Disposition: Home Care  Patient/caregiver understands and will follow disposition?: Yes  Copied from CRM #[id number]. Topic: Clinical - Red Word Triage >> Apr 05, 2024  9:56 AM Tiffini S wrote: [facility] that prompted transfer to Nurse Triage: Patient called about having chest congestion,  asked if she can take Coricidin OTC medication- she is taking high blood pressure/ lisinopril  (ZESTRIL ) 20 MG tablet medicine and asked to talk with nurse Reason for Disposition  Cough  Answer Assessment - Initial Assessment Questions Pt reports onset of mild cough with chest congestion a few days ago. Advised home care for current mild symptoms. Pt also asking if she can take coricidin for her symptoms given she has hx of htn. Pt states the medication box say as it could interfere with other medications. Advised pt to reach out to her pharmacy for more information. Provided number to pts pharmacy on file to call. Advised UC or ED for worsening symptoms.   1. ONSET: When did the cough begin?      A few days ago  2. SEVERITY: How bad is the cough today?      Mild-moderate  3. SPUTUM: Describe the color of your sputum (e.g., none, dry cough; clear, white, yellow, green)     Clear yellow  4. HEMOPTYSIS: Are you coughing up any blood? If Yes, ask: How much? (e.g., flecks, streaks, tablespoons, etc.)     Denies  5. DIFFICULTY BREATHING: Are you having difficulty breathing? If Yes, ask: How bad is it? (e.g., mild, moderate, severe)      Denies  6. FEVER: Do you have a fever? If Yes, ask: What is your temperature, how was it measured, and when did it start?      Denies  7. CARDIAC HISTORY: Do you have any history of heart disease? (e.g., heart attack, congestive heart failure)      Denies  8. LUNG HISTORY: Do you have any history of lung disease?  (e.g., pulmonary embolus, asthma, emphysema)     COPD  9. PE RISK FACTORS: Do you have a history of blood clots? (or: recent major surgery, recent prolonged travel, bedridden)     Denies  10. OTHER SYMPTOMS: Do you have any other symptoms? (e.g., runny nose, wheezing, chest pain)       Chest congestion  Protocols used: Cough - Acute Productive-A-AH

## 2024-04-10 ENCOUNTER — Ambulatory Visit
Admission: RE | Admit: 2024-04-10 | Discharge: 2024-04-10 | Disposition: A | Discharge status: Home / Self Care | Source: Ambulatory Visit | Attending: Internal Medicine | Admitting: Internal Medicine

## 2024-04-10 ENCOUNTER — Ambulatory Visit
Admission: RE | Admit: 2024-04-10 | Discharge: 2024-04-10 | Disposition: A | Source: Ambulatory Visit | Attending: Internal Medicine | Admitting: Internal Medicine

## 2024-04-10 DIAGNOSIS — M5116 Intervertebral disc disorders with radiculopathy, lumbar region: Secondary | ICD-10-CM | POA: Diagnosis not present

## 2024-04-10 DIAGNOSIS — M4807 Spinal stenosis, lumbosacral region: Secondary | ICD-10-CM | POA: Diagnosis not present

## 2024-04-10 DIAGNOSIS — N950 Postmenopausal bleeding: Secondary | ICD-10-CM | POA: Diagnosis not present

## 2024-04-10 DIAGNOSIS — M5416 Radiculopathy, lumbar region: Secondary | ICD-10-CM | POA: Diagnosis not present

## 2024-04-10 DIAGNOSIS — R9389 Abnormal findings on diagnostic imaging of other specified body structures: Secondary | ICD-10-CM | POA: Diagnosis not present

## 2024-04-10 DIAGNOSIS — M4317 Spondylolisthesis, lumbosacral region: Secondary | ICD-10-CM | POA: Diagnosis not present

## 2024-04-11 ENCOUNTER — Telehealth: Payer: Self-pay

## 2024-04-11 NOTE — Telephone Encounter (Signed)
 Copied from CRM #[id number]. Topic: Clinical - Lab/Test Results >> Apr 11, 2024  3:45 PM Zebedee SAUNDERS wrote: Reason for CRM: Pt's daughter Madden,Christina [phone] calling regarding pt's MR Lumbar Spine Wo Contrast and US  Pelvic Complete With Transvaginal imaging results. Please call to discuss results.

## 2024-04-11 NOTE — Telephone Encounter (Signed)
 Notified Kristie Byrd (on DPR)we do not have results yet. As soon as we get them we will notify the patient. Explained also that we will be closed for the Thanksgiving holiday Thursday and Friday

## 2024-04-16 ENCOUNTER — Telehealth: Payer: Self-pay

## 2024-04-16 NOTE — Telephone Encounter (Signed)
 Will call daughter to discuss once results are available

## 2024-04-16 NOTE — Telephone Encounter (Signed)
 Copied from CRM #[id number]. Topic: Clinical - Lab/Test Results >> Apr 16, 2024 11:48 AM Sophia H wrote: Reason for CRM: daughter Tawni calling to check on imaging results for patient. Ultrasound/MRI, advised final results not available yet - Daughter Tawni Cohens is requesting clinic reach out to her before reaching out to the patient to relay news as the patient does not do well with bad news (just in case).    Christina Madden(daughter) [phone] (preferred)

## 2024-04-17 ENCOUNTER — Telehealth: Payer: Self-pay | Admitting: Internal Medicine

## 2024-04-17 ENCOUNTER — Telehealth: Payer: Self-pay

## 2024-04-17 NOTE — Telephone Encounter (Signed)
 Spoke with patients daughter. She will call her mom and see what is going on and schedule an appointment if needed.

## 2024-04-17 NOTE — Telephone Encounter (Unsigned)
 Copied from CRM #[id number]. Topic: Clinical - Lab/Test Results >> Apr 17, 2024  1:36 PM Tiffini S wrote: Reason for CRM: Patient daughter Tawni is calling for update about the  imaging results for the patient ultrasound and MRI, said today makes one week and what to know if results cane be expedited.   Advised daughter that patient has a note stating that they will call her once the results are back.   Please call Tawni Cohens (patient daughter) at [phone] to discuss

## 2024-04-17 NOTE — Telephone Encounter (Signed)
 Copied from CRM #[id number]. Topic: Clinical - Medication Question >> Apr 17, 2024  8:44 AM Pinkey ORN wrote: Patient states she's still experiencing some congestion and wanting to know if a prescription could be called into the pharmacy to help with the cough.  Patient is requesting it be sent to:  [id number] - [place], [place] - [address] AT [facility] [facility] OF [address]  [address] [place] [address]  Phone: [phone] Fax: [phone]  Hours: Not open 24 hours

## 2024-04-18 ENCOUNTER — Encounter: Payer: Self-pay | Admitting: Internal Medicine

## 2024-04-18 ENCOUNTER — Ambulatory Visit: Admitting: Internal Medicine

## 2024-04-18 VITALS — BP 138/74 | HR 68 | Temp 97.8°F | Ht 65.0 in | Wt 223.4 lb

## 2024-04-18 DIAGNOSIS — L821 Other seborrheic keratosis: Secondary | ICD-10-CM

## 2024-04-18 DIAGNOSIS — J441 Chronic obstructive pulmonary disease with (acute) exacerbation: Secondary | ICD-10-CM

## 2024-04-18 DIAGNOSIS — M5416 Radiculopathy, lumbar region: Secondary | ICD-10-CM | POA: Insufficient documentation

## 2024-04-18 MED ORDER — DEXAMETHASONE SOD PHOSPHATE PF 10 MG/ML IJ SOLN
10.0000 mg | Freq: Once | INTRAMUSCULAR | Status: AC
  Administered 2024-04-18: 10 mg via INTRAMUSCULAR

## 2024-04-18 NOTE — Progress Notes (Signed)
 Subjective:    Patient ID: Kristie Byrd, female    DOB: 03-Aug-1945, 78 y.o.   MRN: [id number]  HPI   Discussed the use of AI scribe software for clinical note transcription with the patient, who gave verbal consent to proceed.  Kristie Byrd is a 78 year old female with COPD who presents with congestion and coughing.  She has been experiencing congestion and coughing for the past three to five days. Initially, she took over-the-counter Coricidin, which provided some relief, but the symptoms returned. She describes difficulty in breaking up the congestion, with yellow phlegm when she does.  No headaches, sinus pressure, nasal congestion, ear pain, or sore throat. She experiences increased shortness of breath, especially with exertion, which is more pronounced than usual. She feels tired, which is not new for her. No nausea, vomiting, diarrhea, fever, chills, or body aches, although she mentions having some loose stools, which she attributes to a dressing she ate.  She has a history of COPD and is currently using Breztri  and albuterol . She uses albuterol  as needed, particularly when experiencing shortness of breath during activities. Her symptoms started after her last visit, and she was coughing a little at that time but was not congested.  She has a known allergy to prednisone, which causes severe chest pressure and a feeling of having a heart attack, leading to an emergency room visit here to get. She was advised to avoid prednisone and to wear a medical alert bracelet for this allergy.  She also notes a dry, patchy area on her back near the shoulder, which she noticed this morning and is concerned about its size.     She would also like the results of her recent MRI lumbar spine which showed:  IMPRESSION: 1. L4-L5: Severe right and moderate left neural foraminal stenosis due to small disc bulge with endplate spurring, progressed on the right. No spinal canal stenosis. 2. L5-S1: Severe  bilateral neural foraminal stenosis and moderate spinal canal stenosis due to grade 2 anterolisthesis with disc uncovering, unchanged.  Review of Systems     Past Medical History:  Diagnosis Date   Arthritis    CKD (chronic kidney disease), stage III (HCC)    Colon polyps    COPD (chronic obstructive pulmonary disease) (HCC)    Hypertension    Iron  deficiency anemia 09/27/2012   PONV (postoperative nausea and vomiting)    Shingles    Sleep apnea    starting CPAP in 2024   Uses walker    or cane   Wears dentures    full upper and lower   Wears hearing aid in right ear     Current Outpatient Medications  Medication Sig Dispense Refill   acetaminophen  (TYLENOL ) 650 MG CR tablet Take 1,000 mg by mouth every 4 (four) hours as needed.     albuterol  (PROVENTIL ) (2.5 MG/3ML) 0.083% nebulizer solution Take 3 mLs (2.5 mg total) by nebulization every 6 (six) hours as needed for wheezing or shortness of breath. 150 mL 1   albuterol  (VENTOLIN  HFA) 108 (90 Base) MCG/ACT inhaler INHALE 2 PUFFS INTO THE LUNGS EVERY 6 (SIX) HOURS AS NEEDED FOR WHEEZING OR SHORTNESS OF BREATH. 3 each 0   BREZTRI  AEROSPHERE 160-9-4.8 MCG/ACT AERO inhaler INHALE 2 PUFFS TWICE DAILY 3 each 3   buPROPion  (WELLBUTRIN  XL) 300 MG 24 hr tablet TAKE 1 TABLET EVERY DAY 90 tablet 1   calcitRIOL (ROCALTROL) 0.25 MCG capsule Take 0.25 mcg by mouth daily.  diazepam  (VALIUM ) 10 MG tablet 1 tab p.o. 30 minutes prior to MRI. 1 tablet 0   lisinopril  (ZESTRIL ) 20 MG tablet TAKE 1 TABLET EVERY DAY 90 tablet 1   Multiple Vitamin (MULTIVITAMIN) tablet Take 1 tablet by mouth at bedtime.      omeprazole  (PRILOSEC) 20 MG capsule Take 1 capsule (20 mg total) by mouth daily. 90 capsule 1   propranolol  (INDERAL ) 40 MG tablet TAKE 1 TABLET TWICE DAILY 180 tablet 0   sertraline  (ZOLOFT ) 100 MG tablet TAKE 1 TABLET EVERY DAY 90 tablet 1   No current facility-administered medications for this visit.    Allergies  Allergen Reactions    Codeine Nausea And Vomiting   Demerol [Meperidine] Nausea And Vomiting   Fleet Phospho-Soda [Sodium Phosphates] Nausea And Vomiting    Vomiting    Latex Rash    Blisters   Other Other (See Comments)    Band-Aid, blister   Prednisone Other (See Comments)    Chest pressure    Family History  Problem Relation Age of Onset   Colon cancer Daughter     Social History   Socioeconomic History   Marital status: Divorced    Spouse name: Not on file   Number of children: 2   Years of education: Not on file   Highest education level: Not on file  Occupational History   Occupation: retired  Tobacco Use   Smoking status: Former    Current packs/day: 0.00    Average packs/day: 1 pack/day for 37.0 years (37.0 ttl pk-yrs)    Types: Cigarettes    Start date: 11/16/1959    Quit date: 11/15/1996    Years since quitting: 27.4   Smokeless tobacco: Never  Vaping Use   Vaping status: Never Used  Substance and Sexual Activity   Alcohol  use: No    Alcohol /week: 0.0 standard drinks of alcohol    Drug use: No    Comment: uses CBD oil    Sexual activity: Never  Other Topics Concern   Not on file  Social History Narrative   Lives alone.  Divorced. Two daughters- in 60s, 3 grandchildren.   Does not know family history- raised in [facility].        Desires CPR.  Does not have HPOA.   She would possibly want life support if for short period of time . Unsure about feeding tube.   Social Drivers of Corporate Investment Banker Strain: Low Risk  (10/06/2023)   Overall Financial Resource Strain (CARDIA)    Difficulty of Paying Living Expenses: Not hard at all  Food Insecurity: No Food Insecurity (10/06/2023)   Hunger Vital Sign    Worried About Running Out of Food in the Last Year: Never true    Ran Out of Food in the Last Year: Never true  Transportation Needs: No Transportation Needs (10/06/2023)   PRAPARE - Administrator, Civil Service (Medical): No    Lack of  Transportation (Non-Medical): No  Physical Activity: Inactive (10/06/2023)   Exercise Vital Sign    Days of Exercise per Week: 0 days    Minutes of Exercise per Session: 0 min  Stress: No Stress Concern Present (10/06/2023)   [employer] of Occupational Health - Occupational Stress Questionnaire    Feeling of Stress : Not at all  Social Connections: Moderately Integrated (10/06/2023)   Social Connection and Isolation Panel    Frequency of Communication with Friends and Family: More than three times a  week    Frequency of Social Gatherings with Friends and Family: More than three times a week    Attends Religious Services: More than 4 times per year    Active Member of [employer] or Organizations: Yes    Attends Engineer, Structural: More than 4 times per year    Marital Status: Divorced  Intimate Partner Violence: Not At Risk (10/06/2023)   Humiliation, Afraid, Rape, and Kick questionnaire    Fear of Current or Ex-Partner: No    Emotionally Abused: No    Physically Abused: No    Sexually Abused: No     Constitutional:  Pt reports headache and fatigue. Denies fever, malaise, or abrupt weight changes.  HEENT: Denies eye pain, eye redness, ear pain, ringing in the ears, wax buildup, runny nose, nasal congestion, bloody nose, or sore throat. Respiratory: Pt reports cough, shortness of breath with exertion. Denies difficulty breathing, or sputum production.   Cardiovascular: Denies chest pain, chest tightness, palpitations or swelling in the hands or feet.  Gastrointestinal: Pt reports loose stools. Denies abdominal pain, bloating, constipation, diarrhea or blood in the stool.  Musculoskeletal: Patient reports chronic back and knee pain, difficulty with gait.  Denies decrease in range of motion, muscle pain or joint swelling.   No other specific complaints in a complete review of systems (except as listed in HPI above).  Objective:   Physical Exam  BP 138/74 (BP Location: Right  Arm, Patient Position: Sitting, Cuff Size: Large)   Pulse 68   Temp 97.8 F (36.6 C)   Ht 5' 5 (1.651 m)   Wt 223 lb 6.4 oz (101.3 kg)   SpO2 96%   BMI 37.18 kg/m     Wt Readings from Last 3 Encounters:  04/03/24 224 lb 12.8 oz (102 kg)  01/25/24 221 lb 12.8 oz (100.6 kg)  12/07/23 220 lb 9.6 oz (100.1 kg)    General: Appears her stated age, obese, in NAD. Skin: Warm, dry and intact.  Scattered seborrheic keratosis noted of back HEENT: Head: normal shape and size; Eyes: sclera white, no icterus, conjunctiva pink, PERRLA and EOMs intact;  Neck: No adenopathy noted. Cardiovascular: Normal rate and rhythm. S1,S2 noted.  No murmur, rubs or gallops noted.  Pulmonary/Chest: Normal effort and positive vesicular breath sounds with expiratory wheezing noted on the right. No respiratory distress. No rales or ronchi noted.  Musculoskeletal: No pain with palpation of the spine. Joint enlargement noted in bilateral knees. Strength 5/5 BUE/BLE. Gait slow and steady with use of walker. Neurological: Alert and oriented. Coordination normal.    BMET    Component Value Date/Time   NA 134 (L) 01/25/2024 1420   NA 137 02/25/2014 1404   K 4.5 01/25/2024 1420   K 4.2 02/25/2014 1404   CL 98 01/25/2024 1420   CL 102 02/25/2014 1404   CO2 26 01/25/2024 1420   CO2 28 02/25/2014 1404   GLUCOSE 122 (H) 01/25/2024 1420   GLUCOSE 124 (H) 02/25/2014 1404   BUN 29 (H) 01/25/2024 1420   BUN 20 02/10/2017 0000   BUN 24 (H) 02/25/2014 1404   CREATININE 1.13 (H) 01/25/2024 1420   CREATININE 1.01 (H) 12/07/2023 0908   CALCIUM  9.3 01/25/2024 1420   CALCIUM  9.7 04/06/2021 1322   GFRNONAA 50 (L) 01/25/2024 1420   GFRNONAA 44 (L) 02/25/2014 1404   GFRNONAA 45 (L) 10/26/2013 1330   GFRAA 56 (L) 10/03/2019 1324   GFRAA 54 (L) 02/25/2014 1404   GFRAA 52 (L) 10/26/2013  1330    Lipid Panel     Component Value Date/Time   CHOL 197 12/07/2023 0908   TRIG 81 12/07/2023 0908   HDL 72 12/07/2023 0908    CHOLHDL 2.7 12/07/2023 0908   VLDL 37 04/02/2020 1242   LDLCALC 108 (H) 12/07/2023 0908    CBC    Component Value Date/Time   WBC 5.5 01/25/2024 1420   WBC 4.0 12/07/2023 0908   RBC 3.92 01/25/2024 1420   HGB 11.5 (L) 01/25/2024 1420   HGB 11.4 (L) 02/25/2014 1404   HCT 36.1 01/25/2024 1420   HCT 34.9 (L) 02/25/2014 1404   PLT 221 01/25/2024 1420   PLT 256 02/25/2014 1404   MCV 92.1 01/25/2024 1420   MCV 89 02/25/2014 1404   MCH 29.3 01/25/2024 1420   MCHC 31.9 01/25/2024 1420   RDW 13.5 01/25/2024 1420   RDW 14.7 (H) 02/25/2014 1404   LYMPHSABS 0.9 07/26/2023 1449   LYMPHSABS 1.1 02/25/2014 1404   MONOABS 0.6 07/26/2023 1449   MONOABS 0.5 02/25/2014 1404   EOSABS 0.0 07/26/2023 1449   EOSABS 0.0 02/25/2014 1404   BASOSABS 0.0 07/26/2023 1449   BASOSABS 0.1 02/25/2014 1404    Hgb A1C Lab Results  Component Value Date   HGBA1C 5.6 12/07/2023           Assessment & Plan:  Assessment and Plan    COPD with acute exacerbation Acute exacerbation with increased wheezing and exertional dyspnea. Wheezing likely due to airway constriction. Prednisone allergy limits options. Decadron considered for wheezing management.  No indication for antibiotics at this time - Continue Breztri  and albuterol  PRN. - Continue Coricidin for cough.  -Decadron 10 mg IM x 1  Seborrheic keratoses Benign lesions -No intervention needed at this time, will monitor  Lumbar radiculopathy Intermittent urinary incontinence with certain positions.  Difficulty with gait and balance requiring use of a rolling walker.  Not interested in surgical evaluation. -Referral to physical therapy placed today      RTC in 1 months, follow-up chronic conditions Angeline Laura, NP

## 2024-04-18 NOTE — Patient Instructions (Signed)
Radicular Pain Radicular pain is a type of pain that spreads from your back or neck along a spinal nerve. Spinal nerves are nerves that leave the spinal cord and go to the muscles. Radicular pain is sometimes called radiculopathy, radiculitis, or a pinched nerve. When you have this type of pain, you may also have weakness, numbness, or tingling in the area of your body that is supplied by the nerve. The pain may feel sharp and burning. Depending on which spinal nerve is affected, the pain may occur in the: Neck area (cervical radicular pain). You may also feel pain, numbness, weakness, or tingling in the arms. Mid-spine area (thoracic radicular pain). You would feel this pain in the back and chest. This type is rare. Lower back area (lumbar radicular pain). You would feel this pain as low back pain. You may feel pain, numbness, weakness, or tingling in the buttocks or legs. Sciatica is a type of lumbar radicular pain that shoots down the back of the leg. Radicular pain occurs when one of the spinal nerves becomes irritated or squeezed (compressed). It is often caused by something pushing on a spinal nerve, such as one of the bones of the spine (vertebrae) or one of the round cushions between vertebrae (intervertebral disks). This can result from: An injury. Wear and tear or aging of a disk. The growth of a bone spur that pushes on the nerve. Radicular pain often goes away when you follow instructions from your health care provider for relieving pain at home. How is this treated? Treatment may depend on the cause of the condition and may include: Working with a physical therapist. Taking pain medicine. Applying heat or ice or both to the affected areas. Doing stretches to improve flexibility. Having surgery. This may be needed if other treatments do not help. Different types of surgery may be done depending on the cause of this condition. Follow these instructions at home: Managing pain     If  directed, put ice on the affected area. To do this: Put ice in a plastic bag. Place a towel between your skin and the bag. Leave the ice on for 20 minutes, 2-3 times a day. Remove the ice if your skin turns bright red. This is very important. If you cannot feel pain, heat, or cold, you have a greater risk of damage to the area. If directed, apply heat to the affected area as often as told by your health care provider. Use the heat source that your health care provider recommends, such as a moist heat pack or a heating pad. Place a towel between your skin and the heat source. Leave the heat on for 20-30 minutes. Remove the heat if your skin turns bright red. This is especially important if you are unable to feel pain, heat, or cold. You have a greater risk of getting burned. Activity Do not sit or rest in bed for long periods of time. Try to stay as active as possible. Ask your health care provider what type of exercise or activity is best for you. Avoid activities that make your pain worse, such as bending and lifting. You may have to avoid lifting. Ask your health care provider how much you can safely lift. Practice using proper technique when lifting items. Proper lifting technique involves bending your knees and rising up. Do strength and range-of-motion exercises only as told by your health care provider or physical therapist. General instructions Take over-the-counter and prescription medicines only as told by your  health care provider. Pay attention to any changes in your symptoms. Keep all follow-up visits. This is important. Contact a health care provider if: Your pain and other symptoms get worse. Your pain medicine is not helping. Your pain has not improved after a few weeks of home care. You have a fever. Get help right away if: You have severe pain, weakness, or numbness. You have difficulty with bladder or bowel control. Summary Radicular pain is a type of pain that spreads  from your back or neck along a spinal nerve. When you have radicular pain, you may also have weakness, numbness, or tingling in the area of your body that is supplied by the nerve. The pain may feel sharp or burning. Radicular pain may be treated with ice, heat, medicines, or physical therapy. This information is not intended to replace advice given to you by your health care provider. Make sure you discuss any questions you have with your health care provider. Document Revised: 11/06/2020 Document Reviewed: 11/06/2020 Elsevier Patient Education  2024 [employer].

## 2024-04-19 ENCOUNTER — Ambulatory Visit: Payer: Self-pay | Admitting: Internal Medicine

## 2024-04-19 ENCOUNTER — Encounter: Payer: Self-pay | Admitting: Internal Medicine

## 2024-04-19 DIAGNOSIS — N950 Postmenopausal bleeding: Secondary | ICD-10-CM

## 2024-04-20 NOTE — Telephone Encounter (Signed)
 Referral has been placed.  Someone should reach out to them to get this scheduled.

## 2024-04-23 ENCOUNTER — Encounter: Payer: Self-pay | Admitting: Internal Medicine

## 2024-04-23 ENCOUNTER — Ambulatory Visit: Admitting: Internal Medicine

## 2024-04-23 VITALS — BP 124/68 | HR 71 | Ht 65.0 in | Wt 223.0 lb

## 2024-04-23 DIAGNOSIS — R413 Other amnesia: Secondary | ICD-10-CM | POA: Diagnosis not present

## 2024-04-23 DIAGNOSIS — N950 Postmenopausal bleeding: Secondary | ICD-10-CM | POA: Diagnosis not present

## 2024-04-23 NOTE — Progress Notes (Signed)
 Subjective:    Patient ID: Kristie Byrd, female    DOB: 1945-08-06, 78 y.o.   MRN: [id number]  HPI  Discussed the use of AI scribe software for clinical note transcription with the patient, who gave verbal consent to proceed.  Kristie Byrd is a 78 year old female who presents with concerns about memory issues and postmenopausal vaginal bleeding.  She experiences postmenopausal vaginal bleeding, described as 'just one spot, nothing else.' An ultrasound showed a slightly thickened endometrium. She recalls a previous procedure where cancer cells were removed, which she found painful, but this was a colposcopy many years ago.  She has been referred to GYN, just awaiting for this appointment to be made.  She reports memory issues and confusion, which her family noticed during a recent trip to the beach. She acknowledges occasional forgetfulness, such as misplacing items and forgetting names, but denies more severe memory lapses like leaving the oven on or getting lost while driving. She manages her medications independently, although she sometimes forgets due to falling asleep.    Her last Mini-Mental status exam was a score of 29 in 2022.  There is no imaging of the brain on file.  Review of Systems     Past Medical History:  Diagnosis Date   Arthritis    CKD (chronic kidney disease), stage III (HCC)    Colon polyps    COPD (chronic obstructive pulmonary disease) (HCC)    Hypertension    Iron  deficiency anemia 09/27/2012   PONV (postoperative nausea and vomiting)    Shingles    Sleep apnea    starting CPAP in 2024   Uses walker    or cane   Wears dentures    full upper and lower   Wears hearing aid in right ear     Current Outpatient Medications  Medication Sig Dispense Refill   acetaminophen  (TYLENOL ) 650 MG CR tablet Take 1,000 mg by mouth every 4 (four) hours as needed.     albuterol  (PROVENTIL ) (2.5 MG/3ML) 0.083% nebulizer solution Take 3 mLs (2.5 mg total) by  nebulization every 6 (six) hours as needed for wheezing or shortness of breath. 150 mL 1   albuterol  (VENTOLIN  HFA) 108 (90 Base) MCG/ACT inhaler INHALE 2 PUFFS INTO THE LUNGS EVERY 6 (SIX) HOURS AS NEEDED FOR WHEEZING OR SHORTNESS OF BREATH. 3 each 0   BREZTRI  AEROSPHERE 160-9-4.8 MCG/ACT AERO inhaler INHALE 2 PUFFS TWICE DAILY 3 each 3   buPROPion  (WELLBUTRIN  XL) 300 MG 24 hr tablet TAKE 1 TABLET EVERY DAY 90 tablet 1   calcitRIOL (ROCALTROL) 0.25 MCG capsule Take 0.25 mcg by mouth daily.     lisinopril  (ZESTRIL ) 20 MG tablet TAKE 1 TABLET EVERY DAY 90 tablet 1   Multiple Vitamin (MULTIVITAMIN) tablet Take 1 tablet by mouth at bedtime.      omeprazole  (PRILOSEC) 20 MG capsule Take 1 capsule (20 mg total) by mouth daily. 90 capsule 1   propranolol  (INDERAL ) 40 MG tablet TAKE 1 TABLET TWICE DAILY 180 tablet 0   sertraline  (ZOLOFT ) 100 MG tablet TAKE 1 TABLET EVERY DAY 90 tablet 1   No current facility-administered medications for this visit.    Allergies  Allergen Reactions   Codeine Nausea And Vomiting   Demerol [Meperidine] Nausea And Vomiting   Fleet Phospho-Soda [Sodium Phosphates] Nausea And Vomiting    Vomiting    Latex Rash    Blisters   Other Other (See Comments)    Band-Aid, blister   Prednisone  Other (See Comments)    Chest pressure    Family History  Problem Relation Age of Onset   Colon cancer Daughter     Social History   Socioeconomic History   Marital status: Divorced    Spouse name: Not on file   Number of children: 2   Years of education: Not on file   Highest education level: Not on file  Occupational History   Occupation: retired  Tobacco Use   Smoking status: Former    Current packs/day: 0.00    Average packs/day: 1 pack/day for 37.0 years (37.0 ttl pk-yrs)    Types: Cigarettes    Start date: 11/16/1959    Quit date: 11/15/1996    Years since quitting: 27.4   Smokeless tobacco: Never  Vaping Use   Vaping status: Never Used  Substance and Sexual  Activity   Alcohol  use: No    Alcohol /week: 0.0 standard drinks of alcohol    Drug use: No    Comment: uses CBD oil    Sexual activity: Never  Other Topics Concern   Not on file  Social History Narrative   Lives alone.  Divorced. Two daughters- in 58s, 3 grandchildren.   Does not know family history- raised in [facility].        Desires CPR.  Does not have HPOA.   She would possibly want life support if for short period of time . Unsure about feeding tube.   Social Drivers of Corporate Investment Banker Strain: Low Risk  (10/06/2023)   Overall Financial Resource Strain (CARDIA)    Difficulty of Paying Living Expenses: Not hard at all  Food Insecurity: No Food Insecurity (10/06/2023)   Hunger Vital Sign    Worried About Running Out of Food in the Last Year: Never true    Ran Out of Food in the Last Year: Never true  Transportation Needs: No Transportation Needs (10/06/2023)   PRAPARE - Administrator, Civil Service (Medical): No    Lack of Transportation (Non-Medical): No  Physical Activity: Inactive (10/06/2023)   Exercise Vital Sign    Days of Exercise per Week: 0 days    Minutes of Exercise per Session: 0 min  Stress: No Stress Concern Present (10/06/2023)   [employer] of Occupational Health - Occupational Stress Questionnaire    Feeling of Stress : Not at all  Social Connections: Moderately Integrated (10/06/2023)   Social Connection and Isolation Panel    Frequency of Communication with Friends and Family: More than three times a week    Frequency of Social Gatherings with Friends and Family: More than three times a week    Attends Religious Services: More than 4 times per year    Active Member of [employer] or Organizations: Yes    Attends Banker Meetings: More than 4 times per year    Marital Status: Divorced  Intimate Partner Violence: Not At Risk (10/06/2023)   Humiliation, Afraid, Rape, and Kick questionnaire    Fear of Current or  Ex-Partner: No    Emotionally Abused: No    Physically Abused: No    Sexually Abused: No     Constitutional: Patient reports fatigue.  Denies fever, malaise, headache or abrupt weight changes.  HEENT: Denies eye pain, eye redness, ear pain, ringing in the ears, wax buildup, runny nose, nasal congestion, bloody nose, or sore throat. Respiratory: Patient reports intermittent shortness of breath.  Denies difficulty breathing, cough or sputum production.  Cardiovascular: Denies chest pain, chest tightness, palpitations or swelling in the hands or feet.  Gastrointestinal: Patient reports intermittent reflux.  Denies abdominal pain, bloating, constipation, diarrhea or blood in the stool.  GU: Denies urgency, frequency, pain with urination, burning sensation, blood in urine, odor or discharge. Musculoskeletal: Patient reports joint pain, right shoulder pain, decrease in range of motion, difficulty with gait.  Denies muscle pain or joint swelling.  Skin: Denies redness, rashes, lesions or ulcercations.  Neurological: Patient reports tremor, insomnia, family reports difficulty with memory.  Denies dizziness, difficulty with speech or problems with balance and coordination.  Psych: Patient has a history of anxiety and depression.  Denies SI/HI.  No other specific complaints in a complete review of systems (except as listed in HPI above).  Objective:   Physical Exam   BP 124/68 (BP Location: Right Arm, Patient Position: Sitting, Cuff Size: Large)   Pulse 71   Ht 5' 5 (1.651 m)   Wt 223 lb (101.2 kg)   SpO2 97%   BMI 37.11 kg/m     Wt Readings from Last 3 Encounters:  04/18/24 223 lb 6.4 oz (101.3 kg)  04/03/24 224 lb 12.8 oz (102 kg)  01/25/24 221 lb 12.8 oz (100.6 kg)    General: Appears her stated age, obese, in NAD. Skin: Warm, dry and intact.  HEENT: Head: normal shape and size; Eyes: sclera white, no icterus, conjunctiva pink, PERRLA and EOMs intact;  Cardiovascular: Normal  rate and rhythm. S1 and S2 noted. No murmur, rubs or gallops noted.  Pulmonary/Chest: Normal effort and positive vesicular breath sounds. No respiratory distress. No wheezes, rales or ronchi noted.  Musculoskeletal: Gait slow and steady with use of rolling walker. Neurological: Alert and oriented.  Cranial nerves II through XII intact.  Coordination normal.  Psychiatric: Mood and affect normal. Behavior is normal. Judgment and thought content normal.  BMET    Component Value Date/Time   NA 134 (L) 01/25/2024 1420   NA 137 02/25/2014 1404   K 4.5 01/25/2024 1420   K 4.2 02/25/2014 1404   CL 98 01/25/2024 1420   CL 102 02/25/2014 1404   CO2 26 01/25/2024 1420   CO2 28 02/25/2014 1404   GLUCOSE 122 (H) 01/25/2024 1420   GLUCOSE 124 (H) 02/25/2014 1404   BUN 29 (H) 01/25/2024 1420   BUN 20 02/10/2017 0000   BUN 24 (H) 02/25/2014 1404   CREATININE 1.13 (H) 01/25/2024 1420   CREATININE 1.01 (H) 12/07/2023 0908   CALCIUM  9.3 01/25/2024 1420   CALCIUM  9.7 04/06/2021 1322   GFRNONAA 50 (L) 01/25/2024 1420   GFRNONAA 44 (L) 02/25/2014 1404   GFRNONAA 45 (L) 10/26/2013 1330   GFRAA 56 (L) 10/03/2019 1324   GFRAA 54 (L) 02/25/2014 1404   GFRAA 52 (L) 10/26/2013 1330    Lipid Panel     Component Value Date/Time   CHOL 197 12/07/2023 0908   TRIG 81 12/07/2023 0908   HDL 72 12/07/2023 0908   CHOLHDL 2.7 12/07/2023 0908   VLDL 37 04/02/2020 1242   LDLCALC 108 (H) 12/07/2023 0908    CBC    Component Value Date/Time   WBC 5.5 01/25/2024 1420   WBC 4.0 12/07/2023 0908   RBC 3.92 01/25/2024 1420   HGB 11.5 (L) 01/25/2024 1420   HGB 11.4 (L) 02/25/2014 1404   HCT 36.1 01/25/2024 1420   HCT 34.9 (L) 02/25/2014 1404   PLT 221 01/25/2024 1420   PLT 256 02/25/2014 1404   MCV 92.1  01/25/2024 1420   MCV 89 02/25/2014 1404   MCH 29.3 01/25/2024 1420   MCHC 31.9 01/25/2024 1420   RDW 13.5 01/25/2024 1420   RDW 14.7 (H) 02/25/2014 1404   LYMPHSABS 0.9 07/26/2023 1449   LYMPHSABS 1.1  02/25/2014 1404   MONOABS 0.6 07/26/2023 1449   MONOABS 0.5 02/25/2014 1404   EOSABS 0.0 07/26/2023 1449   EOSABS 0.0 02/25/2014 1404   BASOSABS 0.0 07/26/2023 1449   BASOSABS 0.1 02/25/2014 1404    Hgb A1C Lab Results  Component Value Date   HGBA1C 5.6 12/07/2023           Assessment & Plan:  Assessment and Plan    Postmenopausal vaginal bleeding with thickened endometrium Postmenopausal bleeding with slightly thickened endometrium. Differential includes uterine cancer. Early detection crucial. - Referred to gynecologist for endometrial biopsy, awaiting appointment.   Cognitive concerns No cognitive impairment. Scored 30 on Mini-Mental status exam. - Documented cognitive assessment results. -Discussed possibility of obtaining MRI brain to evaluate for atrophy or ischemic vascular disease however this is not indicated based off her recent testing - Reassured family.    RTC in 1 months for follow-up of chronic conditions Angeline Laura, NP

## 2024-04-23 NOTE — Patient Instructions (Signed)
 Uterine Bleeding After Menopause: What It Means Menopause is the end of a female's fertile years. After menopause, your ovaries can no longer release an egg. You'll no longer be able to get pregnant, and you'll no longer get a period. Any type of bleeding after menopause should be checked by a health care provider, as this is not normal. Treatment will depend on the cause of the bleeding. What can cause bleeding after menopause? Your provider may do tests to find out why you're bleeding. You may have bleeding if: You take hormones to treat the symptoms of menopause. The lining of your uterus thins as a result of low estrogen. The lining of your uterus thickens as a result of too much estrogen. You have cancer in the uterus. You have growths in the uterus, such as: Polyps. Fibroids. Follow these instructions at home: Pay attention to any changes in your symptoms. Let your provider know about them. If told by your provider: Avoid using tampons and douches. Get regular pelvic exams, including Pap tests. Take iron supplements. Change your pads regularly. Take your medicines only as told. Contact a health care provider if: You have pain in your belly. You have headaches. You have a fever or chills. You feel faint or dizzy. Get help right away if: You pass large blood clots. You have heavy bleeding that needs more than 1 pad an hour and you've never had this before. This information is not intended to replace advice given to you by your health care provider. Make sure you discuss any questions you have with your health care provider. Document Revised: 03/15/2023 Document Reviewed: 01/10/2023 Elsevier Patient Education  2025 [employer].

## 2024-05-07 ENCOUNTER — Other Ambulatory Visit: Payer: Self-pay | Admitting: Internal Medicine

## 2024-05-07 DIAGNOSIS — R251 Tremor, unspecified: Secondary | ICD-10-CM

## 2024-05-09 NOTE — Telephone Encounter (Signed)
 Requested Prescriptions  Pending Prescriptions Disp Refills   lisinopril  (ZESTRIL ) 20 MG tablet [Pharmacy Med Name: LISINOPRIL  20 MG Oral Tablet] 90 tablet 0    Sig: TAKE 1 TABLET EVERY DAY     Cardiovascular:  ACE Inhibitors Failed - 05/09/2024 11:14 AM      Failed - Cr in normal range and within 180 days    Creatinine  Date Value Ref Range Status  01/25/2024 1.13 (H) 0.44 - 1.00 mg/dL Final   Creat  Date Value Ref Range Status  12/07/2023 1.01 (H) 0.60 - 1.00 mg/dL Final         Passed - K in normal range and within 180 days    Potassium  Date Value Ref Range Status  01/25/2024 4.5 3.5 - 5.1 mmol/L Final  02/25/2014 4.2 3.5 - 5.1 mmol/L Final         Passed - Patient is not pregnant      Passed - Last BP in normal range    BP Readings from Last 1 Encounters:  04/23/24 124/68         Passed - Valid encounter within last 6 months    Recent Outpatient Visits           2 weeks ago Memory changes   [place] [facility] [place], Angeline ORN, NP   3 weeks ago COPD exacerbation [facility])   [place] [facility] [place], Angeline ORN, NP   1 month ago Great toe pain, right   [place] [facility] [place], Angeline ORN, NP   5 months ago Encounter for general adult medical examination with abnormal findings   [place] [facility] [place], Angeline ORN, NP               albuterol  (VENTOLIN  HFA) [id number] Base) MCG/ACT inhaler [Pharmacy Med Name: ALBUTEROL  SULFATE HFA 108 (90 Base) MCG/ACT Inhalation Aerosol Solution] 3 each 0    Sig: INHALE 2 PUFFS INTO THE LUNGS EVERY 6 (SIX) HOURS AS NEEDED FOR WHEEZING OR SHORTNESS OF BREATH.     Pulmonology:  Beta Agonists 2 Passed - 05/09/2024 11:14 AM      Passed - Last BP in normal range    BP Readings from Last 1 Encounters:  04/23/24 124/68         Passed - Last Heart Rate in normal range    Pulse Readings from Last 1 Encounters:  04/23/24 71         Passed - Valid encounter  within last 12 months    Recent Outpatient Visits           2 weeks ago Memory changes   [facility] [place], Angeline ORN, NP   3 weeks ago COPD exacerbation [facility])   [place] [facility] [place], Angeline ORN, NP   1 month ago Great toe pain, right   [place] [facility] [place], Angeline ORN, NP   5 months ago Encounter for general adult medical examination with abnormal findings    [facility] [place], Angeline ORN, NP               propranolol  (INDERAL ) 40 MG tablet [Pharmacy Med Name: PROPRANOLOL  HYDROCHLORIDE 40 MG Oral Tablet] 180 tablet 0    Sig: TAKE 1 TABLET TWICE DAILY     Cardiovascular:  Beta Blockers Passed - 05/09/2024 11:14 AM      Passed -  Last BP in normal range    BP Readings from Last 1 Encounters:  04/23/24 124/68         Passed - Last Heart Rate in normal range    Pulse Readings from Last 1 Encounters:  04/23/24 71         Passed - Valid encounter within last 6 months    Recent Outpatient Visits           2 weeks ago Memory changes   [place] [facility] [place], Angeline ORN, NP   3 weeks ago COPD exacerbation [facility])   [place] [facility] [place], Angeline ORN, NP   1 month ago Great toe pain, right   [place] [facility] [place], Angeline ORN, NP   5 months ago Encounter for general adult medical examination with abnormal findings   [place] [facility] [place], Angeline ORN, NP

## 2024-05-22 ENCOUNTER — Encounter: Payer: Self-pay | Admitting: Internal Medicine

## 2024-05-22 MED ORDER — DIAZEPAM 5 MG PO TABS: 5.0000 mg | ORAL_TABLET | Freq: Once | ORAL | 0 refills | Status: AC

## 2024-05-22 NOTE — Telephone Encounter (Signed)
 Can we please call the patient, not the daughters, and ask if this is something that she is requesting.  If so, I can send her in Valium  5 mg to take 30 minutes to 1 hour prior to her appointment however I do feel like the daughters have been a little bit pressuring given her current medical issues.

## 2024-05-28 NOTE — Progress Notes (Signed)
 "   GYNECOLOGY PROGRESS NOTE  Subjective:  PCP: Antonette Angeline ORN, NP  Patient ID: Kristie Byrd, female    DOB: Sep 16, 1945, 79 y.o.   MRN: [id number]  HPI  Patient is a 79 y.o. G8P2002 female who presents as referral from PCP for postmenopausal bleeding.  She had one day of vaginal spotting early in November '25 after she fell at home, none since.  TVUS 04/10/24 shows EM = 5mm with multiple cysts measuring up to 8mm.  Also noted simple appearing right adnexal cyst.  PCP gave pt valium  prior to her appointment today. She is not on any blood thinners. Denies hx of abnormal paps.   The following portions of the patient's history were reviewed and updated as appropriate: allergies, current medications, past family history, past medical history, past social history, past surgical history, and problem list.  Review of Systems Pertinent items are noted in HPI.   Objective:   Blood pressure 112/62, pulse 61, height 5' 4 (1.626 m), weight 227 lb (103 kg). Body mass index is 38.96 kg/m.  General appearance: alert, cooperative, and moderately obese Abdomen: soft, non-tender; bowel sounds normal; no masses,  no organomegaly Pelvic: cervix normal in appearance, external genitalia normal, no adnexal masses or tenderness, no cervical motion tenderness, rectovaginal septum normal, uterus normal size, shape, and consistency, vagina normal without discharge, and atrophic mucosa.  Extremities: extremities normal, atraumatic, no cyanosis or edema Neurologic: Grossly normal  Ultrasound 04/10/24 FINDINGS: Uterus   Measurements: 6.2 cm in sagittal dimension. No fibroids or other mass visualized.   Endometrium   Thickness: 5 mm. Multiple cysts within the endometrium measuring up to 8 mm.   Right ovary   Not seen. Simple-appearing right adnexal cyst measures 2.8 x 2.6 x 2.4 cm.   Left ovary   Not seen.   Other findings:  No abnormal free fluid.   IMPRESSION: 1. Borderline thickened  endometrium measuring 5 mm contains multiple cysts measuring up to 8 mm. Recommend gynecologic consultation. 2. Simple-appearing right adnexal cyst measures 2.8 x 2.6 x 2.4 cm, indeterminate, may be ovarian or paraovarian. 3. Ovaries are not seen.    Endometrial Biopsy Procedure Note  The patient was positioned on the exam table in the dorsal lithotomy position. Bimanual exam confirmed uterine position and size. A sterile speculum was placed into the vagina. A single toothed tenaculum was placed onto the anterior lip of the cervix. The pipette was placed into the endocervical canal and while advancing to the uterine fundus, patient pulled away and clenched her legs, stopping my ability to continue the procedure. Speculum required repositioning, this was also extremely painful for the patient, went very slowly and offered her options of discontinuing or proceeding. Patient needing time to think and calm down. She wanted to attempt again. Repositioned speculum to see cervix.  Again inserted the pipette and patient pulled away. She verbally told us  to continue collecting, but was unable to relax her body to allow collection. The specimen was transferred to the biopsy container, unlikely to have collected endometrium. Minimal bleeding encountered. The procedure was poorly tolerated.   Uterine Position: mid anterior posterior   Uterine Length: unsure   Uterine Specimen: Scant due to patient pulling away and requesting to stop   Assessment/Plan:   1. PMB (postmenopausal bleeding)   2. Thickened endometrium     Kristie y.o. [id number] with one scant episode of PMB after a fall, not on blood thinners and no hx of abnml paps, referred by PCP. US   shows EMT 5mm and multiple cysts up to 8mm. EMB attempted today, but too painful. Patient unable to tolerate and after repositioning, doing breathing techniques, and talking her through reattempt, she requested to discontinue.  -Will send the scant sample, but  likely insufficient, was not in uterine cavity -Likely need pre-op to have HSC with D&C, EMB m/l insuff today   Estil Mangle, DO  [facility] "

## 2024-05-29 ENCOUNTER — Encounter: Payer: Self-pay | Admitting: Obstetrics

## 2024-05-29 ENCOUNTER — Other Ambulatory Visit (HOSPITAL_COMMUNITY)
Admission: RE | Admit: 2024-05-29 | Discharge: 2024-05-29 | Disposition: A | Discharge status: Home / Self Care | Source: Ambulatory Visit | Attending: Obstetrics | Admitting: Obstetrics

## 2024-05-29 ENCOUNTER — Ambulatory Visit: Admitting: Obstetrics

## 2024-05-29 VITALS — BP 112/62 | HR 61 | Ht 64.0 in | Wt 227.0 lb

## 2024-05-29 DIAGNOSIS — R9389 Abnormal findings on diagnostic imaging of other specified body structures: Secondary | ICD-10-CM | POA: Diagnosis present

## 2024-05-29 DIAGNOSIS — N950 Postmenopausal bleeding: Secondary | ICD-10-CM | POA: Insufficient documentation

## 2024-05-29 DIAGNOSIS — N858 Other specified noninflammatory disorders of uterus: Secondary | ICD-10-CM

## 2024-05-31 ENCOUNTER — Ambulatory Visit: Payer: Self-pay

## 2024-05-31 ENCOUNTER — Encounter: Payer: Self-pay | Admitting: Family Medicine

## 2024-05-31 ENCOUNTER — Ambulatory Visit: Admitting: Family Medicine

## 2024-05-31 VITALS — BP 130/84 | HR 68 | Ht 64.0 in | Wt 227.4 lb

## 2024-05-31 DIAGNOSIS — A46 Erysipelas: Secondary | ICD-10-CM | POA: Diagnosis not present

## 2024-05-31 LAB — SURGICAL PATHOLOGY

## 2024-05-31 MED ORDER — CEPHALEXIN 500 MG PO CAPS: 500.0000 mg | ORAL_CAPSULE | Freq: Two times a day (BID) | ORAL | 0 refills | Status: DC

## 2024-05-31 NOTE — Telephone Encounter (Signed)
 noted

## 2024-05-31 NOTE — Telephone Encounter (Signed)
 FYI Only or Action Required?: FYI only for provider: appointment scheduled on 1/15.  Patient was last seen in primary care on 04/23/2024 by Antonette Angeline ORN, NP.  Called Nurse Triage reporting Facial Swelling.  Symptoms began yesterday.  Interventions attempted: OTC medications: Neosporin .  Symptoms are: gradually worsening.  Triage Disposition: See HCP Within 4 Hours (Or PCP Triage)  Patient/caregiver understands and will follow disposition?: Yes    Yesterday onset on moderate left sided facial swelling and redness. Cheek and side of nose. About 2 inches wide. Feels hot. No fever.  No SOB or swelling of lips tongue eyes or throat currently. No exposure to known or suspected allergens.   Noted to have fallen 4 months ago. Fell face first with scab on the middle and left side of her face pt has been picking at. Took 5mg  diazepam  on Tuesday, pt wondering if she is having a reaction to this med despite taking 10 mg in the past w/o issue. Increased lisinopril  from 5 mg to 20 mg on 12/24. Has been taking lisinopril  for a long time. Scheduled acute appt with provider at different office in pt region today d/t no availability at home office with any provider within timeframe. Advised UC or ED for worsening symptoms.     Copied from CRM #[id number]. Topic: Clinical - Red Word Triage >> May 31, 2024 10:58 AM Alfonso ORN wrote: Red Word that prompted transfer to Nurse Triage: pt calling regarding rx . left side of face red and swollen , hot, worsening  . possible allergic reaction to medication. Pt misunderstood about staying on hold and disconnected unable to reach . Please call pt back at  [phone] Reason for Disposition  [1] Looks infected (e.g., spreading redness, pus) AND [2] large red area (> 2 inches or 5 cm)  Answer Assessment - Initial Assessment Questions 1. ONSET: When did the swelling start? (e.g., minutes, hours, days)     Yesterday  2. LOCATION: What part of the face is swollen?  (e.g., cheek, entire face, jaw joint area, under jaw)     Left sided facial swelling and redness  3. SEVERITY: How swollen is it?     Moderate   4. ITCHING: Is there any itching? If Yes, ask: How much?   (Scale 1-10; mild, moderate or severe)     Mild, intmermittent  5. PAIN: Is the swelling painful to touch? If Yes, ask: How painful is it?   (Scale 0-10; mild, moderate or severe)     Denies  6. FEVER: Do you have a fever? If Yes, ask: What is it, how was it measured, and when did it start?      Denies  7. CAUSE: What do you think is causing the face swelling?     Maybe after taking 5 mg diazepam  on Tuesday. Has taken 10 mg in the past w/o issue. Also fell on face 4 months ago and had had scabs on left side of face since that pt has been picking at.  8. NEW MEDICINES: Have there been any new medicines started recently?     Increased lisinopril  from 5 mg to 20 mg on 12/24. Has been taking lisinopril  a long time.  9. RECURRENT SYMPTOM: Have you had face swelling before? If Yes, ask: When was the last time? What happened that time?     Denies  10. OTHER SYMPTOMS: Do you have any other symptoms? (e.g., leg swelling, toothache)       Denies  Protocols used: Face  Swelling-A-AH

## 2024-05-31 NOTE — Progress Notes (Signed)
" ° °  Acute Office Visit  Subjective:     Patient ID: Kristie Byrd, female    DOB: 1945-12-10, 79 y.o.   MRN: [id number]  Chief Complaint  Patient presents with   Facial Swelling    Patch started to show up yesterday, only on left side swelling, red and hot to the touch    Discussed the use of AI scribe software for clinical note transcription with the patient, who gave verbal consent to proceed.  History of Present Illness Kristie Byrd is a 79 year old female who presents with a rash on her left cheek.  The rash on her left cheek began yesterday, initially appearing very light but has since become more pronounced and itchy. The area feels hot but there is no pain or general malaise. No recent changes in skincare products; she continues her usual routine of washing her face, applying Neosporin  on her nose, and using a primer and powder for makeup. The powder may make the rash appear lighter than it is.  She has a history of picking at scabs, particularly on her nose, which she injured in a fall on concrete in November. She continues to pick at the scabs on her nose.  She did not take her heartburn medication the night before last due to potential interaction with diazepam , but she resumed it last night.       Review of Systems  All other systems reviewed and are negative.       Objective:    BP 130/84   Pulse 68   Ht 5' 4 (1.626 m)   Wt 227 lb 6 oz (103.1 kg)   SpO2 93%   BMI 39.03 kg/m     Physical Exam Vitals and nursing note reviewed.  Constitutional:      Appearance: Normal appearance.  HENT:     Head: Normocephalic.     Right Ear: External ear normal.     Left Ear: External ear normal.  Eyes:     Conjunctiva/sclera: Conjunctivae normal.  Cardiovascular:     Rate and Rhythm: Normal rate.  Pulmonary:     Effort: Pulmonary effort is normal. No respiratory distress.  Abdominal:     Palpations: Abdomen is soft.  Musculoskeletal:        General: Normal  range of motion.  Skin:    General: Skin is warm.     Findings: Rash present.  Neurological:     Mental Status: She is alert and oriented to person, place, and time.  Psychiatric:        Mood and Affect: Mood normal.     No results found for any visits on 05/31/24.      Assessment & Plan:   Assessment & Plan Erysipelas of face Mild cellulitis on left cheek, likely from skin picking. No systemic symptoms. - Advised to avoid picking at the affected area. Orders:   cephALEXin  (KEFLEX ) 500 MG capsule; Take 1 capsule (500 mg total) by mouth 2 (two) times daily for 7 days.       No follow-ups on file.  Levy Wellman K Sylas Twombly, MD  "

## 2024-06-04 ENCOUNTER — Ambulatory Visit: Admitting: Internal Medicine

## 2024-06-05 ENCOUNTER — Encounter: Payer: Self-pay | Admitting: Internal Medicine

## 2024-06-05 ENCOUNTER — Ambulatory Visit: Admitting: Internal Medicine

## 2024-06-05 VITALS — BP 124/64 | Ht 64.0 in | Wt 223.6 lb

## 2024-06-05 DIAGNOSIS — N183 Chronic kidney disease, stage 3 unspecified: Secondary | ICD-10-CM

## 2024-06-05 DIAGNOSIS — Z23 Encounter for immunization: Secondary | ICD-10-CM | POA: Diagnosis not present

## 2024-06-05 DIAGNOSIS — K219 Gastro-esophageal reflux disease without esophagitis: Secondary | ICD-10-CM

## 2024-06-05 DIAGNOSIS — I10 Essential (primary) hypertension: Secondary | ICD-10-CM | POA: Diagnosis not present

## 2024-06-05 DIAGNOSIS — E781 Pure hyperglyceridemia: Secondary | ICD-10-CM | POA: Diagnosis not present

## 2024-06-05 DIAGNOSIS — F331 Major depressive disorder, recurrent, moderate: Secondary | ICD-10-CM

## 2024-06-05 DIAGNOSIS — F419 Anxiety disorder, unspecified: Secondary | ICD-10-CM

## 2024-06-05 DIAGNOSIS — G4733 Obstructive sleep apnea (adult) (pediatric): Secondary | ICD-10-CM | POA: Diagnosis not present

## 2024-06-05 DIAGNOSIS — M810 Age-related osteoporosis without current pathological fracture: Secondary | ICD-10-CM

## 2024-06-05 DIAGNOSIS — J432 Centrilobular emphysema: Secondary | ICD-10-CM | POA: Diagnosis not present

## 2024-06-05 DIAGNOSIS — N1832 Chronic kidney disease, stage 3b: Secondary | ICD-10-CM

## 2024-06-05 DIAGNOSIS — D631 Anemia in chronic kidney disease: Secondary | ICD-10-CM | POA: Diagnosis not present

## 2024-06-05 DIAGNOSIS — M1711 Unilateral primary osteoarthritis, right knee: Secondary | ICD-10-CM

## 2024-06-05 DIAGNOSIS — R7303 Prediabetes: Secondary | ICD-10-CM

## 2024-06-05 DIAGNOSIS — F411 Generalized anxiety disorder: Secondary | ICD-10-CM

## 2024-06-05 DIAGNOSIS — G250 Essential tremor: Secondary | ICD-10-CM

## 2024-06-05 MED ORDER — SERTRALINE HCL 100 MG PO TABS: 150.0000 mg | ORAL_TABLET | Freq: Every day | ORAL | 1 refills | Status: AC

## 2024-06-05 NOTE — Assessment & Plan Note (Signed)
 Complicated by morbid obesity C-Met and lipid profile today Encouraged her to consume low-fat diet

## 2024-06-05 NOTE — Patient Instructions (Signed)

## 2024-06-05 NOTE — Assessment & Plan Note (Signed)
Continue calcium and vitamin D OTC Encourage daily weightbearing exercise 

## 2024-06-05 NOTE — Assessment & Plan Note (Signed)
 Deteriorated Will increase sertraline  to 150 mg daily Continue buproprion 300 mg XL daily Support offered

## 2024-06-05 NOTE — Progress Notes (Signed)
 "  Subjective:    Patient ID: Kristie Byrd, female    DOB: [birth year], 79 y.o.   MRN: [id number]  HPI  Patient presents to clinic today for follow-up of chronic conditions.  HTN: Complicated by morbid obesity. Her BP today is 124/64.  She is taking lisinopril  and propranolol  as prescribed.  ECG from 04/2016 reviewed.  COPD: She has intermittent shortness of breath but denies chronic cough.  She is not taking breztri  as prescribed due to financial constraints. She is taking albuterol  as needed.  PFTs from 11/2013 reviewed.  NDJ:Rnfeoprjuzi by morbid obesity. She averages 7 hours of sleep per night without the use of her CPAP.  Sleep study from 02/2022 reviewed.  GERD: Complicated by morbid obesity. Triggered by tomato based foods. She denies breakthrough on omeprazole .  She takes as needed with good relief of symptoms.  There is no upper GI on file.  HLD: Complicated by morbid obesity. Her last LDL was 108, triglycerides 81, 11/2023.  She is not taking any cholesterol-lowering medication at this time.  She does not consume a low-fat diet.  Tremor: Managed with propranolol .  She does not follow with neurology.  OA: Mainly in her back and knees.  She has also been having some right shoulder pain. She takes tylenol  as needed with good relief of symptoms.  She does not follow with orthopedics.  CKD 3: Her last creatinine was 1.01, GFR 57, 11/2023. She is on lisinopril  for renal protection.  She follows with nephrology.  Anemia: Secondary to CKD. Her last H/H was 11.5/36.1, 01/2024.  She is taking iron  as presribed.  She follows with hematology.  Anxiety and depression (moderate, recurrent): Chronic, managed on sertraline  and bupropion . She has been feeling stressed out lately. She is not currently seeing a therapist.  She denies SI/HI.  Prediabetes: Complicated by morbid obesity. Her last A1c was 5.6%, 11/2023.  She is not taking any oral diabetic medications time.  She does not check her  sugars.  Osteoporosis: She is taking calcium  and  vitamin d  OTC.  She does not get weightbearing exercise.  Bone density from 09/2020 reviewed.   Review of Systems     Past Medical History:  Diagnosis Date   Arthritis    CKD (chronic kidney disease), stage III (HCC)    Colon polyps    COPD (chronic obstructive pulmonary disease) (HCC)    Hypertension    Iron  deficiency anemia 09/27/2012   PONV (postoperative nausea and vomiting)    Shingles    Sleep apnea    starting CPAP in 2024   Uses walker    or cane   Wears dentures    full upper and lower   Wears hearing aid in right ear     Current Outpatient Medications  Medication Sig Dispense Refill   acetaminophen  (TYLENOL ) 650 MG CR tablet Take 1,000 mg by mouth every 4 (four) hours as needed.     albuterol  (PROVENTIL ) (2.5 MG/3ML) 0.083% nebulizer solution Take 3 mLs (2.5 mg total) by nebulization every 6 (six) hours as needed for wheezing or shortness of breath. 150 mL 1   albuterol  (VENTOLIN  HFA) 108 (90 Base) MCG/ACT inhaler INHALE 2 PUFFS INTO THE LUNGS EVERY 6 (SIX) HOURS AS NEEDED FOR WHEEZING OR SHORTNESS OF BREATH. 3 each 0   BREZTRI  AEROSPHERE 160-9-4.8 MCG/ACT AERO inhaler INHALE 2 PUFFS TWICE DAILY 3 each 3   buPROPion  (WELLBUTRIN  XL) 300 MG 24 hr tablet TAKE 1 TABLET EVERY DAY 90 tablet 1  calcitRIOL (ROCALTROL) 0.25 MCG capsule Take 0.25 mcg by mouth daily.     cephALEXin  (KEFLEX ) 500 MG capsule Take 1 capsule (500 mg total) by mouth 2 (two) times daily for 7 days. 14 capsule 0   lisinopril  (ZESTRIL ) 20 MG tablet TAKE 1 TABLET EVERY DAY 90 tablet 0   Multiple Vitamin (MULTIVITAMIN) tablet Take 1 tablet by mouth at bedtime.      omeprazole  (PRILOSEC) 20 MG capsule Take 1 capsule (20 mg total) by mouth daily. 90 capsule 1   propranolol  (INDERAL ) 40 MG tablet TAKE 1 TABLET TWICE DAILY 180 tablet 0   sertraline  (ZOLOFT ) 100 MG tablet TAKE 1 TABLET EVERY DAY 90 tablet 1   No current facility-administered medications for  this visit.    Allergies  Allergen Reactions   Codeine Nausea And Vomiting   Demerol [Meperidine] Nausea And Vomiting   Fleet Phospho-Soda [Sodium Phosphates] Nausea And Vomiting    Vomiting    Latex Rash    Blisters   Other Other (See Comments)    Band-Aid, blister   Prednisone Other (See Comments)    Chest pressure    Family History  Problem Relation Age of Onset   Colon cancer Daughter     Social History   Socioeconomic History   Marital status: Divorced    Spouse name: Not on file   Number of children: 2   Years of education: Not on file   Highest education level: Not on file  Occupational History   Occupation: retired  Tobacco Use   Smoking status: Former    Current packs/day: 0.00    Average packs/day: 1 pack/day for 37.0 years (37.0 ttl pk-yrs)    Types: Cigarettes    Start date: 11/16/1959    Quit date: 11/15/1996    Years since quitting: 27.5   Smokeless tobacco: Never  Vaping Use   Vaping status: Never Used  Substance and Sexual Activity   Alcohol  use: No    Alcohol /week: 0.0 standard drinks of alcohol    Drug use: No    Comment: uses CBD oil    Sexual activity: Never  Other Topics Concern   Not on file  Social History Narrative   Lives alone.  Divorced. Two daughters- in 4s, 3 grandchildren.   Does not know family history- raised in [facility].        Desires CPR.  Does not have HPOA.   She would possibly want life support if for short period of time . Unsure about feeding tube.   Social Drivers of Health   Tobacco Use: Medium Risk (05/31/2024)   Patient History    Smoking Tobacco Use: Former    Smokeless Tobacco Use: Never    Passive Exposure: Not on file  Financial Resource Strain: Low Risk (10/06/2023)   Overall Financial Resource Strain (CARDIA)    Difficulty of Paying Living Expenses: Not hard at all  Food Insecurity: No Food Insecurity (10/06/2023)   Hunger Vital Sign    Worried About Running Out of Food in the Last Year:  Never true    Ran Out of Food in the Last Year: Never true  Transportation Needs: No Transportation Needs (10/06/2023)   PRAPARE - Administrator, Civil Service (Medical): No    Lack of Transportation (Non-Medical): No  Physical Activity: Inactive (10/06/2023)   Exercise Vital Sign    Days of Exercise per Week: 0 days    Minutes of Exercise per Session: 0 min  Stress: No  Stress Concern Present (10/06/2023)   [employer] of Occupational Health - Occupational Stress Questionnaire    Feeling of Stress : Not at all  Social Connections: Moderately Integrated (10/06/2023)   Social Connection and Isolation Panel    Frequency of Communication with Friends and Family: More than three times a week    Frequency of Social Gatherings with Friends and Family: More than three times a week    Attends Religious Services: More than 4 times per year    Active Member of [employer] or Organizations: Yes    Attends Banker Meetings: More than 4 times per year    Marital Status: Divorced  Intimate Partner Violence: Not At Risk (10/06/2023)   Humiliation, Afraid, Rape, and Kick questionnaire    Fear of Current or Ex-Partner: No    Emotionally Abused: No    Physically Abused: No    Sexually Abused: No  Depression (PHQ2-9): High Risk (04/03/2024)   Depression (PHQ2-9)    PHQ-2 Score: 22  Alcohol  Screen: Low Risk (10/06/2023)   Alcohol  Screen    Last Alcohol  Screening Score (AUDIT): 0  Housing: Unknown (10/06/2023)   Housing Stability Vital Sign    Unable to Pay for Housing in the Last Year: No    Number of Times Moved in the Last Year: Not on file    Homeless in the Last Year: No  Utilities: Not At Risk (10/06/2023)   [employer] Utilities    Threatened with loss of utilities: No  Health Literacy: Adequate Health Literacy (10/06/2023)   B1300 Health Literacy    Frequency of need for help with medical instructions: Never     Constitutional: Denies fever, malaise, fatigue, headache or  abrupt weight changes.  HEENT: Denies eye pain, eye redness, ear pain, ringing in the ears, wax buildup, runny nose, nasal congestion, bloody nose, or sore throat. Respiratory: Patient reports intermittent shortness of breath.  Denies difficulty breathing, cough or sputum production.   Cardiovascular: Denies chest pain, chest tightness, palpitations or swelling in the hands or feet.  Gastrointestinal: Patient reports intermittent reflux.  Denies abdominal pain, bloating, constipation, diarrhea or blood in the stool.  GU: Denies urgency, frequency, pain with urination, burning sensation, blood in urine, odor or discharge. Musculoskeletal: Patient reports joint pain.  Denies difficulty with gait, muscle pain or joint swelling.  Skin: Denies redness, rashes, lesions or ulcercations.  Neurological: Patient reports tremor.  Denies dizziness, difficulty with memory, difficulty with speech or problems with balance and coordination.  Psych: Patient has a history of anxiety and depression.  Denies SI/HI.  No other specific complaints in a complete review of systems (except as listed in HPI above).  Objective:   Physical Exam  BP 124/64 (BP Location: Right Arm, Patient Position: Sitting, Cuff Size: Large)   Ht 5' 4 (1.626 m)   Wt 223 lb 9.6 oz (101.4 kg)   BMI 38.38 kg/m     Wt Readings from Last 3 Encounters:  05/31/24 227 lb 6 oz (103.1 kg)  05/29/24 227 lb (103 kg)  04/23/24 223 lb (101.2 kg)    General: Appears her stated age, obese, in NAD. Skin: Warm, dry and intact. No ulcerations noted. HEENT: Head: normal shape and size; Eyes: sclera white, no icterus, conjunctiva pink, PERRLA and EOMs intact;  Cardiovascular: Normal rate and rhythm. S1 and S2 noted. No murmur, rubs or gallops noted. No JVD or BLE edema.  Pulmonary/Chest: Normal effort and positive vesicular breath sounds. No respiratory distress. No wheezes, rales or  ronchi noted.  Abdomen: Active bowel sounds. Musculoskeletal:  Gait slow and steady with use of rolling walker. Neurological: Alert and oriented. Coordination normal.  Psychiatric: Mood and affect normal. Behavior is normal. Judgment and thought content normal.  BMET    Component Value Date/Time   NA 134 (L) 01/25/2024 1420   NA 137 02/25/2014 1404   K 4.5 01/25/2024 1420   K 4.2 02/25/2014 1404   CL 98 01/25/2024 1420   CL 102 02/25/2014 1404   CO2 26 01/25/2024 1420   CO2 28 02/25/2014 1404   GLUCOSE 122 (H) 01/25/2024 1420   GLUCOSE 124 (H) 02/25/2014 1404   BUN 29 (H) 01/25/2024 1420   BUN 20 02/10/2017 0000   BUN 24 (H) 02/25/2014 1404   CREATININE 1.13 (H) 01/25/2024 1420   CREATININE 1.01 (H) 12/07/2023 0908   CALCIUM  9.3 01/25/2024 1420   CALCIUM  9.7 04/06/2021 1322   GFRNONAA 50 (L) 01/25/2024 1420   GFRNONAA 44 (L) 02/25/2014 1404   GFRNONAA 45 (L) 10/26/2013 1330   GFRAA 56 (L) 10/03/2019 1324   GFRAA 54 (L) 02/25/2014 1404   GFRAA 52 (L) 10/26/2013 1330    Lipid Panel     Component Value Date/Time   CHOL 197 12/07/2023 0908   TRIG 81 12/07/2023 0908   HDL 72 12/07/2023 0908   CHOLHDL 2.7 12/07/2023 0908   VLDL 37 04/02/2020 1242   LDLCALC 108 (H) 12/07/2023 0908    CBC    Component Value Date/Time   WBC 5.5 01/25/2024 1420   WBC 4.0 12/07/2023 0908   RBC 3.92 01/25/2024 1420   HGB 11.5 (L) 01/25/2024 1420   HGB 11.4 (L) 02/25/2014 1404   HCT 36.1 01/25/2024 1420   HCT 34.9 (L) 02/25/2014 1404   PLT 221 01/25/2024 1420   PLT 256 02/25/2014 1404   MCV 92.1 01/25/2024 1420   MCV 89 02/25/2014 1404   MCH 29.3 01/25/2024 1420   MCHC 31.9 01/25/2024 1420   RDW 13.5 01/25/2024 1420   RDW 14.7 (H) 02/25/2014 1404   LYMPHSABS 0.9 07/26/2023 1449   LYMPHSABS 1.1 02/25/2014 1404   MONOABS 0.6 07/26/2023 1449   MONOABS 0.5 02/25/2014 1404   EOSABS 0.0 07/26/2023 1449   EOSABS 0.0 02/25/2014 1404   BASOSABS 0.0 07/26/2023 1449   BASOSABS 0.1 02/25/2014 1404    Hgb A1C Lab Results  Component Value Date    HGBA1C 5.6 12/07/2023           Assessment & Plan:     RTC in 6 months for your annual exam Angeline Laura, NP  "

## 2024-06-05 NOTE — Assessment & Plan Note (Signed)
 Complicated by morbid obesity A1c today Encourage low-carb diet and exercise for weight

## 2024-06-05 NOTE — Assessment & Plan Note (Signed)
 Advised her to contact her insurance company to find alternative to breztri  160-9-4.8 mcg /act due to affordability issues Continue albuterol  108 mcg/act every 4-6 hours as needed Will monitor

## 2024-06-05 NOTE — Assessment & Plan Note (Signed)
 Encouraged diet and exercise for weight loss ?

## 2024-06-05 NOTE — Assessment & Plan Note (Signed)
 Continue propranolol  40 mg BID Will monitor

## 2024-06-05 NOTE — Assessment & Plan Note (Signed)
 CMET today Continue lisinopril  20 mg dailyfor renal protection She will continue to follow with nephrology

## 2024-06-05 NOTE — Assessment & Plan Note (Signed)
 Complicated by morbid obesity Encourage weight loss as this can help reduce joint pain Okay to continue tylenol  OTC as needed

## 2024-06-05 NOTE — Assessment & Plan Note (Signed)
CBC today Continue oral iron She will continue to follow with hematology

## 2024-06-05 NOTE — Assessment & Plan Note (Signed)
 Complicated by morbid obesity Controlled on lisinopril  20 mg daily and propranolol  40 mg BID Reinforced DASH diet and exercise for weight loss C-Met today

## 2024-06-05 NOTE — Assessment & Plan Note (Signed)
 Complicated by morbid obesity Encourage weight loss as this can reduce sleep apnea symptoms Noncompliant with CPAP use

## 2024-06-05 NOTE — Assessment & Plan Note (Signed)
 Complicated by morbid obesity Avoid foods that trigger reflux Encourage weight loss as this can help reduce reflux symptoms Continue omeprazole  20 mg daily

## 2024-06-06 ENCOUNTER — Ambulatory Visit: Payer: Self-pay | Admitting: Obstetrics

## 2024-06-06 ENCOUNTER — Ambulatory Visit: Payer: Self-pay | Admitting: Internal Medicine

## 2024-06-06 LAB — CBC
HCT: 38.2 % (ref 35.9–46.0)
Hemoglobin: 12.1 g/dL (ref 11.7–15.5)
MCH: 29.3 pg (ref 27.0–33.0)
MCHC: 31.7 g/dL (ref 31.6–35.4)
MCV: 92.5 fL (ref 81.4–101.7)
MPV: 9.8 fL (ref 7.5–12.5)
Platelets: 268 Thousand/uL (ref 140–400)
RBC: 4.13 Million/uL (ref 3.80–5.10)
RDW: 13.3 % (ref 11.0–15.0)
WBC: 6.6 Thousand/uL (ref 3.8–10.8)

## 2024-06-06 LAB — HEMOGLOBIN A1C
Hgb A1c MFr Bld: 5.4 %
Mean Plasma Glucose: 108 mg/dL
eAG (mmol/L): 6 mmol/L

## 2024-06-06 LAB — LIPID PANEL
Cholesterol: 189 mg/dL
HDL: 62 mg/dL
LDL Cholesterol (Calc): 99 mg/dL
Non-HDL Cholesterol (Calc): 127 mg/dL
Total CHOL/HDL Ratio: 3 (calc)
Triglycerides: 189 mg/dL — ABNORMAL HIGH

## 2024-06-06 LAB — COMPREHENSIVE METABOLIC PANEL WITH GFR
AG Ratio: 1.7 (calc) (ref 1.0–2.5)
ALT: 15 U/L (ref 6–29)
AST: 17 U/L (ref 10–35)
Albumin: 4 g/dL (ref 3.6–5.1)
Alkaline phosphatase (APISO): 85 U/L (ref 37–153)
BUN/Creatinine Ratio: 27 (calc) — ABNORMAL HIGH (ref 6–22)
BUN: 36 mg/dL — ABNORMAL HIGH (ref 7–25)
CO2: 33 mmol/L — ABNORMAL HIGH (ref 20–32)
Calcium: 9.7 mg/dL (ref 8.6–10.4)
Chloride: 101 mmol/L (ref 98–110)
Creat: 1.33 mg/dL — ABNORMAL HIGH (ref 0.60–1.00)
Globulin: 2.4 g/dL (ref 1.9–3.7)
Glucose, Bld: 89 mg/dL (ref 65–139)
Potassium: 5 mmol/L (ref 3.5–5.3)
Sodium: 140 mmol/L (ref 135–146)
Total Bilirubin: 0.3 mg/dL (ref 0.2–1.2)
Total Protein: 6.4 g/dL (ref 6.1–8.1)
eGFR: 41 mL/min/1.73m2 — ABNORMAL LOW

## 2024-07-12 ENCOUNTER — Encounter: Admitting: Obstetrics

## 2024-07-24 ENCOUNTER — Ambulatory Visit

## 2024-07-24 ENCOUNTER — Ambulatory Visit: Admitting: Internal Medicine

## 2024-07-24 ENCOUNTER — Other Ambulatory Visit

## 2024-10-12 ENCOUNTER — Ambulatory Visit

## 2024-10-17 ENCOUNTER — Ambulatory Visit

## 2024-12-04 ENCOUNTER — Encounter: Admitting: Internal Medicine
